# Patient Record
Sex: Male | Born: 1940 | Race: White | Hispanic: No | Marital: Married | Smoking: Former smoker
Health system: Southern US, Community
[De-identification: ages and names within clinical notes are randomized; demographics above are authoritative.]

## PROBLEM LIST (undated history)

## (undated) DIAGNOSIS — C3490 Malignant neoplasm of unspecified part of unspecified bronchus or lung: Secondary | ICD-10-CM

## (undated) DIAGNOSIS — H9190 Unspecified hearing loss, unspecified ear: Secondary | ICD-10-CM

## (undated) DIAGNOSIS — F1910 Other psychoactive substance abuse, uncomplicated: Secondary | ICD-10-CM

## (undated) DIAGNOSIS — I509 Heart failure, unspecified: Secondary | ICD-10-CM

## (undated) DIAGNOSIS — J449 Chronic obstructive pulmonary disease, unspecified: Secondary | ICD-10-CM

## (undated) DIAGNOSIS — R918 Other nonspecific abnormal finding of lung field: Secondary | ICD-10-CM

## (undated) DIAGNOSIS — I5020 Unspecified systolic (congestive) heart failure: Secondary | ICD-10-CM

## (undated) DIAGNOSIS — A481 Legionnaires' disease: Secondary | ICD-10-CM

## (undated) DIAGNOSIS — C7931 Secondary malignant neoplasm of brain: Secondary | ICD-10-CM

## (undated) HISTORY — DX: Other nonspecific abnormal finding of lung field: R91.8

## (undated) HISTORY — PX: NOSE SURGERY: SHX723

## (undated) HISTORY — PX: GANGLION CYST EXCISION: SHX1691

## (undated) HISTORY — PX: WRIST SURGERY: SHX841

## (undated) HISTORY — DX: Unspecified systolic (congestive) heart failure: I50.20

## (undated) HISTORY — DX: Other psychoactive substance abuse, uncomplicated: F19.10

---

## 2001-03-01 ENCOUNTER — Encounter: Payer: Self-pay | Admitting: Family Medicine

## 2001-03-01 ENCOUNTER — Ambulatory Visit (HOSPITAL_COMMUNITY): Admission: RE | Admit: 2001-03-01 | Discharge: 2001-03-01 | Payer: Self-pay | Admitting: Family Medicine

## 2001-12-20 ENCOUNTER — Ambulatory Visit (HOSPITAL_COMMUNITY): Admission: RE | Admit: 2001-12-20 | Discharge: 2001-12-20 | Payer: Self-pay | Admitting: Family Medicine

## 2001-12-20 ENCOUNTER — Encounter: Payer: Self-pay | Admitting: Family Medicine

## 2002-07-04 ENCOUNTER — Encounter: Payer: Self-pay | Admitting: Family Medicine

## 2002-07-04 ENCOUNTER — Ambulatory Visit (HOSPITAL_COMMUNITY): Admission: RE | Admit: 2002-07-04 | Discharge: 2002-07-04 | Payer: Self-pay | Admitting: Family Medicine

## 2002-07-06 ENCOUNTER — Encounter: Payer: Self-pay | Admitting: Family Medicine

## 2002-07-06 ENCOUNTER — Ambulatory Visit (HOSPITAL_COMMUNITY): Admission: RE | Admit: 2002-07-06 | Discharge: 2002-07-06 | Payer: Self-pay | Admitting: Family Medicine

## 2003-04-21 ENCOUNTER — Ambulatory Visit (HOSPITAL_COMMUNITY)
Admission: RE | Admit: 2003-04-21 | Discharge: 2003-04-21 | Payer: Self-pay | Admitting: Physical Medicine and Rehabilitation

## 2004-04-17 ENCOUNTER — Ambulatory Visit (HOSPITAL_COMMUNITY): Admission: RE | Admit: 2004-04-17 | Discharge: 2004-04-17 | Payer: Self-pay | Admitting: Internal Medicine

## 2004-04-17 ENCOUNTER — Ambulatory Visit: Payer: Self-pay | Admitting: Internal Medicine

## 2012-07-23 ENCOUNTER — Emergency Department (HOSPITAL_COMMUNITY): Payer: Medicare Other

## 2012-07-23 ENCOUNTER — Encounter (HOSPITAL_COMMUNITY): Payer: Self-pay

## 2012-07-23 ENCOUNTER — Emergency Department (HOSPITAL_COMMUNITY)
Admission: EM | Admit: 2012-07-23 | Discharge: 2012-07-23 | Disposition: A | Discharge status: Home / Self Care | Payer: Medicare Other | Attending: Emergency Medicine | Admitting: Emergency Medicine

## 2012-07-23 DIAGNOSIS — Z8701 Personal history of pneumonia (recurrent): Secondary | ICD-10-CM | POA: Insufficient documentation

## 2012-07-23 DIAGNOSIS — Z8739 Personal history of other diseases of the musculoskeletal system and connective tissue: Secondary | ICD-10-CM | POA: Insufficient documentation

## 2012-07-23 DIAGNOSIS — M542 Cervicalgia: Secondary | ICD-10-CM | POA: Insufficient documentation

## 2012-07-23 DIAGNOSIS — R059 Cough, unspecified: Secondary | ICD-10-CM | POA: Insufficient documentation

## 2012-07-23 DIAGNOSIS — R918 Other nonspecific abnormal finding of lung field: Secondary | ICD-10-CM

## 2012-07-23 DIAGNOSIS — R222 Localized swelling, mass and lump, trunk: Secondary | ICD-10-CM | POA: Insufficient documentation

## 2012-07-23 DIAGNOSIS — F17200 Nicotine dependence, unspecified, uncomplicated: Secondary | ICD-10-CM | POA: Insufficient documentation

## 2012-07-23 DIAGNOSIS — J3489 Other specified disorders of nose and nasal sinuses: Secondary | ICD-10-CM | POA: Insufficient documentation

## 2012-07-23 DIAGNOSIS — R05 Cough: Secondary | ICD-10-CM | POA: Insufficient documentation

## 2012-07-23 LAB — CBC WITH DIFFERENTIAL/PLATELET
Basophils Absolute: 0 10*3/uL (ref 0.0–0.1)
Eosinophils Relative: 2 % (ref 0–5)
Lymphocytes Relative: 23 % (ref 12–46)
Neutro Abs: 4.7 10*3/uL (ref 1.7–7.7)
Platelets: 289 10*3/uL (ref 150–400)
RDW: 13.6 % (ref 11.5–15.5)
WBC: 7.1 10*3/uL (ref 4.0–10.5)

## 2012-07-23 LAB — BASIC METABOLIC PANEL
CO2: 25 mEq/L (ref 19–32)
Calcium: 9.7 mg/dL (ref 8.4–10.5)
Chloride: 99 mEq/L (ref 96–112)
Sodium: 135 mEq/L (ref 135–145)

## 2012-07-23 LAB — TROPONIN I: Troponin I: <0.3 ng/mL (ref <0.30)

## 2012-07-23 LAB — MAGNESIUM: Magnesium: 2 mg/dL (ref 1.5–2.5)

## 2012-07-23 MED ORDER — SODIUM CHLORIDE 0.9 % IV SOLN
Freq: Once | INTRAVENOUS | Status: AC
  Administered 2012-07-23: 15:00:00 via INTRAVENOUS

## 2012-07-23 MED ORDER — IOHEXOL 300 MG/ML  SOLN
80.0000 mL | Freq: Once | INTRAMUSCULAR | Status: AC | PRN
  Administered 2012-07-23: 80 mL via INTRAVENOUS

## 2012-07-23 NOTE — ED Provider Notes (Addendum)
History  This chart was scribed for Hurman Horn, MD by Erskine Emery, ED Scribe. This patient was seen in room APA09/APA09 and the patient's care was started at 16:00.   CSN: [id number]  Arrival date & time 07/23/12  1423   First MD Initiated Contact with Patient 07/23/12 1600      Chief Complaint  Patient presents with  . Chest Pain    (Consider location/radiation/quality/duration/timing/severity/associated sxs/prior treatment) The history is provided by the patient. No language interpreter was used.  Luis Payne is a 72 y.o. male who presents to the Emergency Department complaining of constant 24/7 mild aching pain in his elbow that radiates up to the left shoulder and left chest for the past couple weeks. Pt denies any associated SOB, nausea, emesis, diaphoresis, fever, weakness or numbness in the arm, color change in the left arm, swelling, weight loss, coughing up blood, bloody stools, or abdominal pain. He reports some chronic congestion and cough but no recent worsening. He also has some chronic neck pain, possibly from arthritis. Pt reports aspirin relieves the pain but nothing seems to aggravate the pain. The pain has not at all limited his function.  Pt reports he saw his PCP this morning for the same complaint. His PCP ran an EKG that was normal but he asked him to come to the hospital anyways. Pt reports he is otherwise perfectly healthy. 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 Pt reports he lives with his wife. Pt is a smoker.    Dr. Sherwood Gambler is the pt's PCP.  History reviewed. No pertinent past medical history.  Past Surgical History  Procedure Laterality Date  . Wrist surgery      No family history on file.  History  Substance Use Topics  . Smoking status: Current Every Day Smoker  . Smokeless tobacco: Not on file  . Alcohol Use: No      Review of Systems 10 Systems reviewed and are  negative for acute change except as noted in the HPI.   Allergies  Review of patient's allergies indicates no known allergies.  Home Medications   Current Outpatient Rx  Name  Route  Sig  Dispense  Refill  . Ascorbic Acid (VITAMIN C) 500 MG CAPS   Oral   Take 1 capsule by mouth every morning.         Marland Kitchen aspirin buffered (BUFFERIN) 325 MG TABS tablet   Oral   Take 975 mg by mouth once as needed.         . cholecalciferol (VITAMIN D) 1000 UNITS tablet   Oral   Take 1,000 Units by mouth every morning.         . Multiple Vitamin (MULTIVITAMIN WITH MINERALS) TABS   Oral   Take 1 tablet by mouth every morning.           Triage Vitals: BP 156/64  Pulse 44  Temp(Src) 97.3 F (36.3 C) (Oral)  Resp 18  SpO2 100%  Physical Exam  Nursing note and vitals reviewed. Constitutional:  Awake, alert, nontoxic appearance.  HENT:  Head: Atraumatic.  Mouth/Throat: Oropharynx is clear and moist. No oropharyngeal exudate.  Eyes: Right eye exhibits no discharge. Left eye exhibits no discharge.  Neck: Neck supple.  Cardiovascular: Normal rate, regular rhythm and normal heart sounds.   No murmur heard. Pulmonary/Chest: Effort normal and breath sounds normal. No respiratory distress. He exhibits no  tenderness.  Lungs are clear.  Abdominal: Soft. There is no tenderness. There is no rebound.  Musculoskeletal: He exhibits no tenderness.  Baseline ROM, no obvious new focal weakness. Left arm is nontender with no edema. CR is less than 2 seconds. Left radial pulse is intact. Intact light touch and motor strength distributions of the median, radial, and ulnar nerve functions.  Neurological:  Mental status and motor strength appears baseline for patient and situation.  Skin: No rash noted.  Psychiatric: He has a normal mood and affect.    ED Course  Procedures (including critical care time) DIAGNOSTIC STUDIES: Oxygen Saturation is 100% on room air, normal by my interpretation.     COORDINATION OF CARE: 16:10--Patient / Family / Caregiver informed of clinical course, understand medical decision-making process, and agree with plan. We discussed the pt's chest x-ray results. Treatment plan includes: IV fluids, CBC, troponin, chest x-ray, EKG, chest CT with contrast.  19:06--Pt stable in ED with no significant deterioration in condition. I explained to the pt the results of his chest CT, which he took well. I gave him a referral for [facility]--[facility]idisciplinary thoracic oncology clinic for follow up.  Labs Reviewed  BASIC METABOLIC PANEL - Abnormal; Notable for the following:    GFR calc non Af Amer 73 (*)    GFR calc Af Amer 84 (*)    All other components within normal limits  CBC WITH DIFFERENTIAL  TROPONIN I  MAGNESIUM   Dg Chest 2 View  07/23/2012  *RADIOLOGY REPORT*  Clinical Data: Chest pain  CHEST - 2 VIEW  Comparison: None.  Findings: 6.4 x 5.2 cm mass-like opacity in the left upper lobe.  Underlying chronic is markings/that this changes. No pleural effusion or pneumothorax.  The heart is normal in size.  Mild degenerative changes of the visualized thoracolumbar spine.  IMPRESSION: 6.4 cm mass-like opacity in the left upper lobe, worrisome for primary bronchogenic neoplasm.  CT chest with contrast is suggested for further evaluation.   Original Report Authenticated By: Charline Bills, M.D.    Ct Chest W Contrast  07/23/2012  *RADIOLOGY REPORT*  Clinical Data: Chest pain. COPD.  Left lung mass seen on chest radiograph.  CT CHEST WITH CONTRAST  Technique:  Multidetector CT imaging of the chest was performed following the standard protocol during bolus administration of intravenous contrast.  Contrast: 80mL OMNIPAQUE IOHEXOL 300 MG/ML  SOLN  Comparison: Chest radiograph earlier today  Findings: Severe pulmonary emphysema is demonstrated.  A large spiculated mass is seen in the left lung apex which abuts the anterior chest wall.  This measures 4.8 x 5.6 cm, and is  consistent with bronchogenic carcinoma.  No definite evidence of direct chest wall invasion or pleural effusion.  No other suspicious pulmonary nodules masses are identified.  Right lower lobe scarring noted.  There is no evidence of hilar lymphadenopathy. A 6 mm lymph node is seen in the region of the AP window, which is not pathologically enlarged. No pathologically enlarged mediastinal nodes are seen. No other lymphadenopathy seen within the thorax.  No evidence of chest wall mass or suspicious bone lesions.  Both adrenal glands are normal in appearance.  IMPRESSION:  1.  5.6 cm spiculated mass in the anterior left lung apex, consistent with bronchogenic carcinoma.  Consultation with the [facility] [phone]) should be considered. 2.  No definite evidence of metastatic disease. 3.  Severe pulmonary emphysema.   Original Report Authenticated By: Myles Rosenthal, M.D.  1. Lung mass   2. Chest Pain    MDM   I doubt any other EMC precluding discharge at this time including, but not necessarily limited to the following:ACS.  I personally performed the services described in this documentation, which was scribed in my presence. The recorded information has been reviewed and is accurate.    Hurman Horn, MD 07/24/12 1257  Hurman Horn, MD 09/09/12 [phone]

## 2012-07-23 NOTE — ED Notes (Signed)
Pt reports has had intermittent aching in elbow that radiates up to left shoulder and left chest.  Says feels like pressure on his chest and arm aches.  Reports left hand feels numb and tingly.  Denies any SOB, n/v, or sweating.

## 2012-08-02 ENCOUNTER — Encounter (HOSPITAL_COMMUNITY)
Admission: RE | Admit: 2012-08-02 | Discharge: 2012-08-02 | Disposition: A | Discharge status: Home / Self Care | Payer: Medicare Other | Source: Ambulatory Visit | Attending: Emergency Medicine | Admitting: Emergency Medicine

## 2012-08-02 DIAGNOSIS — J439 Emphysema, unspecified: Secondary | ICD-10-CM | POA: Insufficient documentation

## 2012-08-02 DIAGNOSIS — R222 Localized swelling, mass and lump, trunk: Secondary | ICD-10-CM | POA: Insufficient documentation

## 2012-08-02 DIAGNOSIS — N200 Calculus of kidney: Secondary | ICD-10-CM | POA: Insufficient documentation

## 2012-08-02 DIAGNOSIS — R0789 Other chest pain: Secondary | ICD-10-CM | POA: Insufficient documentation

## 2012-08-02 DIAGNOSIS — I2510 Atherosclerotic heart disease of native coronary artery without angina pectoris: Secondary | ICD-10-CM | POA: Insufficient documentation

## 2012-08-02 MED ORDER — FLUDEOXYGLUCOSE F - 18 (FDG) INJECTION
18.0000 | Freq: Once | INTRAVENOUS | Status: AC | PRN
  Administered 2012-08-02: 18 via INTRAVENOUS

## 2012-08-05 ENCOUNTER — Institutional Professional Consult (permissible substitution) (INDEPENDENT_AMBULATORY_CARE_PROVIDER_SITE_OTHER): Payer: Medicare Other | Admitting: Surgery

## 2012-08-05 ENCOUNTER — Encounter: Payer: Self-pay | Admitting: Surgery

## 2012-08-05 ENCOUNTER — Encounter: Payer: Self-pay | Admitting: *Deleted

## 2012-08-05 ENCOUNTER — Other Ambulatory Visit: Payer: Self-pay | Admitting: *Deleted

## 2012-08-05 VITALS — BP 147/58 | HR 67 | Resp 16 | Ht 66.0 in | Wt 126.0 lb

## 2012-08-05 DIAGNOSIS — R918 Other nonspecific abnormal finding of lung field: Secondary | ICD-10-CM

## 2012-08-05 DIAGNOSIS — R222 Localized swelling, mass and lump, trunk: Secondary | ICD-10-CM

## 2012-08-05 DIAGNOSIS — J439 Emphysema, unspecified: Secondary | ICD-10-CM

## 2012-08-05 DIAGNOSIS — J438 Other emphysema: Secondary | ICD-10-CM

## 2012-08-05 DIAGNOSIS — F1910 Other psychoactive substance abuse, uncomplicated: Secondary | ICD-10-CM | POA: Insufficient documentation

## 2012-08-05 NOTE — Progress Notes (Signed)
[address]       [place] [address]             [phone]       PCP is Cassell Smiles., MD Referring Provider is Hurman Horn, MD  Chief Complaint  Patient presents with  . Lung Mass    CT CHEST @ [facility] while in the ER    HPI:  The patient is a 72 year old white male smoker who presented to the [facility] emergency room a couple weeks ago with a several week history of aching pain in his left arm from the elbow to the shoulder. A chest x-ray in the emergency room showed a left upper lobe lung mass. CT scan the chest showed a 4.8 x 5.6 cm mass in the left lung apex abutting the anterior chest wall suggesting bronchogenic carcinoma. There was no definite evidence of direct chest wall invasion. There was no pleural effusion. There was no evidence of hilar lymphadenopathy. There was a 6 mm lymph node in the AP window and there were no other pathologically enlarged mediastinal lymph nodes. There were changes of severe emphysema. He then underwent a PET scan which showed hypermetabolic activity within the left upper lobe lung mass with a maximum SUV of 12.8 with some suggestion of involvement of the left anterior first rib. There were no other areas of hypermetabolic activity within the mediastinal or hilar nodes or distant hypermetabolic activity.  Past Medical History  Diagnosis Date  . Substance abuse     TOBACCO  . Lung mass     Past Surgical History  Procedure Laterality Date  . Wrist surgery      Family History  Problem Relation Age of Onset  . COPD Father   . Cancer Sister   . Diabetes Brother     Social History History  Substance Use Topics  . Smoking status: Current Every Day Smoker -- 1.00 packs/day    Types: Cigarettes  . Smokeless tobacco: Never Used     Comment: TRING TO QUIT...DOWN TO 1 PACK PER WEEK  . Alcohol Use: No    Current Outpatient Prescriptions  Medication Sig Dispense Refill  . Ascorbic Acid (VITAMIN C) 500 MG CAPS  Take 1 capsule by mouth every morning.      Marland Kitchen aspirin buffered (BUFFERIN) 325 MG TABS tablet Take 975 mg by mouth once as needed.      . cholecalciferol (VITAMIN D) 1000 UNITS tablet Take 1,000 Units by mouth every morning.      . Multiple Vitamin (MULTIVITAMIN WITH MINERALS) TABS Take 1 tablet by mouth every morning.       No current facility-administered medications for this visit.    No Known Allergies  Review of Systems  Constitutional: Positive for unexpected weight change. Negative for activity change, appetite change and fatigue.       Has lost 10 lbs over the last year.  HENT: Positive for hearing loss.   Eyes: Negative.   Respiratory: Positive for shortness of breath.        With exertion.  Non-productive cough.  Cardiovascular: Negative for chest pain and palpitations.  Endocrine: Negative.   Genitourinary: Negative.   Musculoskeletal: Positive for arthralgias.       Pain in left arm from elbow to shoulder.  Skin: Negative.   Neurological: Negative.   Hematological: Negative.   Psychiatric/Behavioral: Negative.     BP 147/58  Pulse 67  Resp 16  Ht 5\' 6"  (1.676 m)  Wt 126 lb (57.153 kg)  BMI 20.35 kg/m2  SpO2 98% Physical Exam  Constitutional: He is oriented to person, place, and time. He appears well-developed and well-nourished.  Thin gentleman in no distress.  HENT:  Head: Normocephalic and atraumatic.  Mouth/Throat: Oropharynx is clear and moist.  Eyes: Conjunctivae and EOM are normal. Pupils are equal, round, and reactive to light.  Neck: No JVD present. No thyromegaly present.  Cardiovascular: Normal rate, regular rhythm, normal heart sounds and intact distal pulses.  Exam reveals no gallop and no friction rub.   No murmur heard. Pulmonary/Chest: Effort normal and breath sounds normal. No respiratory distress. He has no wheezes. He has no rales. He exhibits no tenderness.  No chest wall deformity.  Abdominal: Soft. Bowel sounds are normal. He exhibits  no distension and no mass. There is no tenderness.  Musculoskeletal: Normal range of motion. He exhibits no edema and no tenderness.  Lymphadenopathy:    He has no cervical adenopathy.  Neurological: He is alert and oriented to person, place, and time. He has normal strength. No cranial nerve deficit or sensory deficit.  Skin: Skin is warm and dry.  Psychiatric: He has a normal mood and affect.     Diagnostic Tests:  *RADIOLOGY REPORT*   Clinical Data: Initial treatment strategy for lung mass.   NUCLEAR MEDICINE PET SKULL BASE TO THIGH  08/02/2012   Fasting Blood Glucose:  92   Technique:  18.0 mCi F-18 FDG was injected intravenously. CT data was obtained and used for attenuation correction and anatomic localization only.  (This was not acquired as a diagnostic CT examination.) Additional exam technical data entered on technologist worksheet.   Comparison:  07/23/2012   Findings:   Neck: No hypermetabolic lymph nodes in the neck.   Chest:  5.9 x 6.1 cm left upper lobe mass (series 2/image 53), max SUV 12.8, compatible with primary bronchogenic neoplasm.   Mass abuts the anterior chest wall with suspected involvement of the left anterior first rib (series 2/image 53).   Underlying extensive centrilobular and paraseptal emphysematous changes with dominant bullae in the right lower lobe.   No hypermetabolic mediastinal or hilar nodes.   Coronary atherosclerosis.   Abdomen/Pelvis:  No abnormal hypermetabolic activity within the liver, pancreas, adrenal glands, or spleen.   No hypermetabolic lymph nodes in the abdomen or pelvis.   Multiple bilateral nonobstructing renal calculi measuring up to 8 mm in the right upper pole and 6 mm in the left lower pole.  No hydronephrosis.   Skeleton:  Suspected left first rib involvement, as described above.   Otherwise, no focal hypermetabolism to suggest osseous metastases.   IMPRESSION: 6.1 cm left upper lobe mass, max SUV  12.8, compatible with primary bronchogenic neoplasm.   Mass abuts the anterior chest wall with suspected involvement of the left anterior 1st rib.   Otherwise, no evidence of metastatic disease.     Original Report Authenticated By: Charline Bills, M.D.   *RADIOLOGY REPORT*   Clinical Data: Chest pain. COPD.  Left lung mass seen on chest radiograph.   CT CHEST WITH CONTRAST 07/23/2012   Technique:  Multidetector CT imaging of the chest was performed following the standard protocol during bolus administration of intravenous contrast.   Contrast: 80mL OMNIPAQUE IOHEXOL 300 MG/ML  SOLN   Comparison: Chest radiograph earlier today   Findings: Severe pulmonary emphysema is demonstrated.  A large spiculated mass is seen in the left lung apex which abuts the anterior chest wall.  This  measures 4.8 x 5.6 cm, and is consistent with bronchogenic carcinoma.  No definite evidence of direct chest wall invasion or pleural effusion.  No other suspicious pulmonary nodules masses are identified.  Right lower lobe scarring noted.   There is no evidence of hilar lymphadenopathy. A 6 mm lymph node is seen in the region of the AP window, which is not pathologically enlarged. No pathologically enlarged mediastinal nodes are seen. No other lymphadenopathy seen within the thorax.  No evidence of chest wall mass or suspicious bone lesions.  Both adrenal glands are normal in appearance.   IMPRESSION:   1.  5.6 cm spiculated mass in the anterior left lung apex, consistent with bronchogenic carcinoma.  Consultation with the [facility] [phone]) should be considered. 2.  No definite evidence of metastatic disease. 3.  Severe pulmonary emphysema.     Original Report Authenticated By: Myles Rosenthal, M.D.    Impression:  This gentleman has a large left apical lung mass abutting the anterior chest wall that is most likely bronchogenic carcinoma. His left  arm symptoms are concerning for direct chest wall involvement but the uppermost extent of this tumor still looks below the subclavian/axillary vessels and brachial plexus. I think that if his pulmonary function testing is acceptable and his brain MRI is negative then surgical resection by her left upper lobectomy and possible chest wall resection if indicated would be the best treatment for him.  Plan: 1. We'll schedule pulmonary function testing with diffusion capacity. 2. We'll schedule MRI of the brain. 3. I will see him back after these tests have been done to discuss results with him and decide on a treatment plan.

## 2012-08-05 NOTE — Progress Notes (Signed)
Spoke to pt and wife at [facility] today.  Gave and explained information on smoking cessation and lung surgery.

## 2012-08-06 ENCOUNTER — Encounter: Payer: Self-pay | Admitting: *Deleted

## 2012-08-11 ENCOUNTER — Encounter: Payer: Self-pay | Admitting: Surgery

## 2012-08-11 ENCOUNTER — Ambulatory Visit (HOSPITAL_COMMUNITY)
Admission: RE | Admit: 2012-08-11 | Discharge: 2012-08-11 | Disposition: A | Discharge status: Home / Self Care | Payer: Medicare Other | Source: Ambulatory Visit | Attending: Surgery | Admitting: Surgery

## 2012-08-11 ENCOUNTER — Ambulatory Visit (INDEPENDENT_AMBULATORY_CARE_PROVIDER_SITE_OTHER): Payer: Medicare Other | Admitting: Surgery

## 2012-08-11 VITALS — BP 123/77 | HR 84 | Resp 20 | Ht 64.0 in | Wt 120.0 lb

## 2012-08-11 DIAGNOSIS — R05 Cough: Secondary | ICD-10-CM | POA: Insufficient documentation

## 2012-08-11 DIAGNOSIS — J438 Other emphysema: Secondary | ICD-10-CM

## 2012-08-11 DIAGNOSIS — R918 Other nonspecific abnormal finding of lung field: Secondary | ICD-10-CM

## 2012-08-11 DIAGNOSIS — C3490 Malignant neoplasm of unspecified part of unspecified bronchus or lung: Secondary | ICD-10-CM | POA: Insufficient documentation

## 2012-08-11 DIAGNOSIS — J439 Emphysema, unspecified: Secondary | ICD-10-CM

## 2012-08-11 DIAGNOSIS — R059 Cough, unspecified: Secondary | ICD-10-CM | POA: Insufficient documentation

## 2012-08-11 DIAGNOSIS — R222 Localized swelling, mass and lump, trunk: Secondary | ICD-10-CM

## 2012-08-11 DIAGNOSIS — R42 Dizziness and giddiness: Secondary | ICD-10-CM | POA: Insufficient documentation

## 2012-08-11 LAB — PULMONARY FUNCTION TEST

## 2012-08-11 MED ORDER — ALBUTEROL SULFATE (5 MG/ML) 0.5% IN NEBU
2.5000 mg | INHALATION_SOLUTION | Freq: Once | RESPIRATORY_TRACT | Status: AC
  Administered 2012-08-11: 2.5 mg via RESPIRATORY_TRACT

## 2012-08-11 MED ORDER — GADOBENATE DIMEGLUMINE 529 MG/ML IV SOLN
10.0000 mL | Freq: Once | INTRAVENOUS | Status: AC
  Administered 2012-08-11: 10 mL via INTRAVENOUS

## 2012-08-11 NOTE — Progress Notes (Signed)
[address]       [place] [address]             [phone]       HPI:  The patient is a 72 year old white male smoker who presented to the [facility] emergency room a couple weeks ago with a several week history of aching pain in his left arm from the elbow to the shoulder. A chest x-ray in the emergency room showed a left upper lobe lung mass. CT scan the chest showed a 4.8 x 5.6 cm mass in the left lung apex abutting the anterior chest wall suggesting bronchogenic carcinoma. There was no definite evidence of direct chest wall invasion. There was no pleural effusion. There was no evidence of hilar lymphadenopathy. There was a 6 mm lymph node in the AP window and there were no other pathologically enlarged mediastinal lymph nodes. There were changes of severe emphysema. He then underwent a PET scan which showed hypermetabolic activity within the left upper lobe lung mass with a maximum SUV of 12.8 with some suggestion of involvement of the left anterior first rib. There were no other areas of hypermetabolic activity within the mediastinal or hilar nodes or distant hypermetabolic activity. 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 The patient denies any headaches or visual changes. He denies focal weakness. He denies dizziness. He denies seizure activity.      Current Outpatient Prescriptions  Medication Sig Dispense Refill  . Ascorbic Acid (VITAMIN C) 500 MG CAPS Take 1 capsule by mouth every morning.      . cholecalciferol (VITAMIN D) 1000 UNITS tablet Take 1,000 Units by mouth every morning.      Marland Kitchen ibuprofen (ADVIL,MOTRIN) 200 MG tablet Take 200 mg by mouth every 8 (eight) hours as needed for pain.      Marland Kitchen loratadine (CLARITIN) 10 MG tablet Take 10 mg by mouth daily.      . Multiple Vitamin (MULTIVITAMIN WITH MINERALS) TABS  Take 1 tablet by mouth every morning.       No current facility-administered medications for this visit.     Physical Exam: BP 123/77  Pulse 84  Resp 20  Ht 5\' 4"  (1.626 m)  Wt 120 lb (54.432 kg)  BMI 20.59 kg/m2  SpO2 95% He is a thin, chronically ill-appearing gentleman. Lung exam is clear. Neurologic exam shows him to be alert and oriented x3. Motor strength is 5 out of 5 in all extremities.  Diagnostic Tests:  *RADIOLOGY REPORT*   Clinical Data: Lung cancer.  Dizziness.   MRI HEAD WITHOUT AND WITH CONTRAST   Technique:  Multiplanar, multiecho pulse sequences of the brain and surrounding structures were obtained according to standard protocol without and with intravenous contrast   Contrast: 10mL MULTIHANCE GADOBENATE DIMEGLUMINE 529 MG/ML IV SOLN   Comparison: None.   Findings: There is a 5 mm metastasis in the right parietal lobe towards the vertex with surrounding vasogenic edema but no shift or hemorrhage.  There is a 4 mm metastasis at the left parietal region at the vertex with surrounding edema but no shift or hemorrhage. No other lesion is seen.  There are mild chronic small vessel changes affecting the hemispheric white matter.  No hydrocephalus or extra-axial collection.  No pituitary mass.  No inflammatory sinus disease.  No skull or skull  base lesion.   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     Original Report Authenticated By: Paulina Fusi, M.D.     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 This will make him a nonsurgical candidate. He'll need a CT guided needle biopsy of the left lung mass and followup in [facility] clinic for radiation oncology and medical oncology evaluation. At this time I don't think he is  having any neurologic symptoms related to the brain metastasis which are small. I think his left arm pain is probably related to the chest wall involvement. I reviewed the MRI scan with the patient and his wife as well as the further workup from here. They seem to understand and all their questions were answered.  Plan:  He'll be scheduled for CT-guided needle biopsy of the left upper lobe lung mass by interventional radiology. He will have a followup appointment in [facility] clinic next week.

## 2012-08-12 ENCOUNTER — Other Ambulatory Visit: Payer: Self-pay

## 2012-08-12 DIAGNOSIS — D381 Neoplasm of uncertain behavior of trachea, bronchus and lung: Secondary | ICD-10-CM

## 2012-08-13 ENCOUNTER — Other Ambulatory Visit: Payer: Self-pay | Admitting: Radiology

## 2012-08-16 ENCOUNTER — Other Ambulatory Visit: Payer: Self-pay | Admitting: Radiation Therapy

## 2012-08-16 ENCOUNTER — Telehealth: Payer: Self-pay | Admitting: *Deleted

## 2012-08-16 DIAGNOSIS — C7931 Secondary malignant neoplasm of brain: Secondary | ICD-10-CM

## 2012-08-16 DIAGNOSIS — C7949 Secondary malignant neoplasm of other parts of nervous system: Secondary | ICD-10-CM

## 2012-08-16 NOTE — Telephone Encounter (Signed)
Spoke with pt regarding appt time and place.  He verbalized understanding of time and place.  [facility] 08/19/12 at 2:00 arrive at 1:45

## 2012-08-17 ENCOUNTER — Encounter (HOSPITAL_COMMUNITY): Payer: Self-pay

## 2012-08-17 ENCOUNTER — Other Ambulatory Visit: Payer: Self-pay | Admitting: *Deleted

## 2012-08-17 ENCOUNTER — Ambulatory Visit (HOSPITAL_COMMUNITY)
Admission: RE | Admit: 2012-08-17 | Discharge: 2012-08-17 | Disposition: A | Discharge status: Home / Self Care | Payer: Medicare Other | Source: Ambulatory Visit | Attending: Surgery | Admitting: Surgery

## 2012-08-17 DIAGNOSIS — D381 Neoplasm of uncertain behavior of trachea, bronchus and lung: Secondary | ICD-10-CM

## 2012-08-17 DIAGNOSIS — F17200 Nicotine dependence, unspecified, uncomplicated: Secondary | ICD-10-CM | POA: Insufficient documentation

## 2012-08-17 DIAGNOSIS — Z01812 Encounter for preprocedural laboratory examination: Secondary | ICD-10-CM | POA: Insufficient documentation

## 2012-08-17 DIAGNOSIS — C3410 Malignant neoplasm of upper lobe, unspecified bronchus or lung: Secondary | ICD-10-CM | POA: Insufficient documentation

## 2012-08-17 DIAGNOSIS — C7931 Secondary malignant neoplasm of brain: Secondary | ICD-10-CM

## 2012-08-17 DIAGNOSIS — C3490 Malignant neoplasm of unspecified part of unspecified bronchus or lung: Secondary | ICD-10-CM

## 2012-08-17 HISTORY — PX: LUNG BIOPSY: SHX232

## 2012-08-17 HISTORY — DX: Malignant neoplasm of unspecified part of unspecified bronchus or lung: C34.90

## 2012-08-17 LAB — APTT: aPTT: 31 seconds (ref 24–37)

## 2012-08-17 LAB — CBC
Platelets: 240 10*3/uL (ref 150–400)
RBC: 4.43 MIL/uL (ref 4.22–5.81)
RDW: 13.6 % (ref 11.5–15.5)
WBC: 7.3 10*3/uL (ref 4.0–10.5)

## 2012-08-17 LAB — PROTIME-INR: INR: 0.96 (ref 0.00–1.49)

## 2012-08-17 MED ORDER — HYDROCODONE-ACETAMINOPHEN 5-325 MG PO TABS: 1.0000 | ORAL_TABLET | ORAL | Status: DC | PRN

## 2012-08-17 MED ORDER — SODIUM CHLORIDE 0.9 % IV SOLN
Freq: Once | INTRAVENOUS | Status: AC
  Administered 2012-08-17: 08:00:00 via INTRAVENOUS

## 2012-08-17 MED ORDER — DEXAMETHASONE 4 MG PO TABS: 4.0000 mg | ORAL_TABLET | Freq: Two times a day (BID) | ORAL | Status: DC

## 2012-08-17 MED ORDER — FENTANYL CITRATE 0.05 MG/ML IJ SOLN
INTRAMUSCULAR | Status: AC
  Filled 2012-08-17: qty 2

## 2012-08-17 MED ORDER — MIDAZOLAM HCL 2 MG/2ML IJ SOLN
INTRAMUSCULAR | Status: AC | PRN
  Administered 2012-08-17: 1 mg via INTRAVENOUS

## 2012-08-17 MED ORDER — FENTANYL CITRATE 0.05 MG/ML IJ SOLN
INTRAMUSCULAR | Status: AC | PRN
  Administered 2012-08-17: 25 ug via INTRAVENOUS

## 2012-08-17 MED ORDER — MIDAZOLAM HCL 2 MG/2ML IJ SOLN
INTRAMUSCULAR | Status: AC
  Filled 2012-08-17: qty 4

## 2012-08-17 NOTE — ED Notes (Signed)
Short stay notified for bed 

## 2012-08-17 NOTE — H&P (Signed)
Luis Payne is an 72 y.o. male.   Chief Complaint: left arm pain 2 weeks ago Primary MD had xrays performed and pt was sent to Dr Laneta Simmers for evaluation CT reveals left lung mass; +PET Scheduled now for left lung mass biopsy HPI: smoker  Past Medical History  Diagnosis Date  . Substance abuse     TOBACCO  . Lung mass     Past Surgical History  Procedure Laterality Date  . Wrist surgery      Family History  Problem Relation Age of Onset  . COPD Father   . Cancer Sister   . Diabetes Brother    Social History:  reports that he has been smoking Cigarettes.  He has been smoking about 1.00 pack per day. He has never used smokeless tobacco. He reports that he does not drink alcohol or use illicit drugs.  Allergies: No Known Allergies   (Not in a hospital admission)  Results for orders placed during the hospital encounter of 08/17/12 (from the past 48 hour(s))  APTT     Status: None   Collection Time    08/17/12  7:40 AM      Result Value Range   aPTT 31  24 - 37 seconds  CBC     Status: None   Collection Time    08/17/12  7:40 AM      Result Value Range   WBC 7.3  4.0 - 10.5 K/uL   RBC 4.43  4.22 - 5.81 MIL/uL   Hemoglobin 13.7  13.0 - 17.0 g/dL   HCT 16.1  09.6 - 04.5 %   MCV 88.3  78.0 - 100.0 fL   MCH 30.9  26.0 - 34.0 pg   MCHC 35.0  30.0 - 36.0 g/dL   RDW 40.9  81.1 - 91.4 %   Platelets 240  150 - 400 K/uL  PROTIME-INR     Status: None   Collection Time    08/17/12  7:40 AM      Result Value Range   Prothrombin Time 12.7  11.6 - 15.2 seconds   INR 0.96  0.00 - 1.49   No results found.  Review of Systems  Constitutional: Positive for weight loss. Negative for fever.  Cardiovascular: Negative for chest pain.  Gastrointestinal: Negative for nausea, vomiting and abdominal pain.  Musculoskeletal: Positive for back pain and joint pain.       Left arm pain  Neurological: Negative for weakness and headaches.    Blood pressure 149/68, pulse 84, temperature  97.4 F (36.3 C), temperature source Oral, resp. rate 20, height 5\' 6"  (1.676 m), weight 120 lb (54.432 kg), SpO2 95.00%. Physical Exam  Constitutional: He is oriented to person, place, and time.  Cardiovascular: Normal rate and normal heart sounds.   No murmur heard. Sl irreg  Respiratory: Effort normal and breath sounds normal. He has no wheezes.  GI: Soft. Bowel sounds are normal. There is no tenderness.  Musculoskeletal: Normal range of motion.  Neurological: He is alert and oriented to person, place, and time.  Skin: Skin is warm and dry.  Psychiatric: He has a normal mood and affect. His behavior is normal. Judgment and thought content normal.     Assessment/Plan Left lung mass; +PET Scheduled for mass biopsy Pt aware of procedure benefits and risks and agreeable to proceed Consent signed and in chart  Luis Payne A 08/17/2012, 8:33 AM

## 2012-08-17 NOTE — Procedures (Signed)
Procedure:  CT guided core biopsy of left lung Findings:  CT guided core biopsy of 5.5 cm LUL lung mass.  No PTX.

## 2012-08-17 NOTE — H&P (Signed)
Agree.  For LUL mass biopsy in CT today.

## 2012-08-18 ENCOUNTER — Ambulatory Visit
Admission: RE | Admit: 2012-08-18 | Discharge: 2012-08-18 | Disposition: A | Discharge status: Home / Self Care | Payer: Medicare Other | Source: Ambulatory Visit | Attending: Radiation Oncology | Admitting: Radiation Oncology

## 2012-08-18 DIAGNOSIS — C7931 Secondary malignant neoplasm of brain: Secondary | ICD-10-CM

## 2012-08-18 HISTORY — DX: Secondary malignant neoplasm of brain: C79.31

## 2012-08-18 MED ORDER — GADOBENATE DIMEGLUMINE 529 MG/ML IV SOLN: 11.0000 mL | Freq: Once | INTRAVENOUS | Status: AC | PRN

## 2012-08-18 NOTE — Progress Notes (Signed)
Quick Note:  Susan, please set-up SRS ______ 

## 2012-08-19 ENCOUNTER — Encounter: Payer: Self-pay | Admitting: *Deleted

## 2012-08-19 ENCOUNTER — Encounter: Payer: Self-pay | Admitting: Radiation Oncology

## 2012-08-19 ENCOUNTER — Ambulatory Visit
Admission: RE | Admit: 2012-08-19 | Discharge: 2012-08-19 | Disposition: A | Discharge status: Home / Self Care | Payer: Medicare Other | Source: Ambulatory Visit | Attending: Radiation Oncology | Admitting: Radiation Oncology

## 2012-08-19 DIAGNOSIS — F17200 Nicotine dependence, unspecified, uncomplicated: Secondary | ICD-10-CM | POA: Insufficient documentation

## 2012-08-19 DIAGNOSIS — C3410 Malignant neoplasm of upper lobe, unspecified bronchus or lung: Secondary | ICD-10-CM | POA: Insufficient documentation

## 2012-08-19 DIAGNOSIS — J4489 Other specified chronic obstructive pulmonary disease: Secondary | ICD-10-CM | POA: Insufficient documentation

## 2012-08-19 DIAGNOSIS — C7931 Secondary malignant neoplasm of brain: Secondary | ICD-10-CM | POA: Insufficient documentation

## 2012-08-19 DIAGNOSIS — J449 Chronic obstructive pulmonary disease, unspecified: Secondary | ICD-10-CM | POA: Insufficient documentation

## 2012-08-19 DIAGNOSIS — C7949 Secondary malignant neoplasm of other parts of nervous system: Secondary | ICD-10-CM

## 2012-08-19 DIAGNOSIS — Z79899 Other long term (current) drug therapy: Secondary | ICD-10-CM | POA: Insufficient documentation

## 2012-08-19 NOTE — Progress Notes (Signed)
Spoke with pt and family at [facility] today.  Educational/resource information given and explained.  Distress screening completed.  Smoking cessation eduction given.  Questions and concerns addressed

## 2012-08-20 ENCOUNTER — Ambulatory Visit
Admission: RE | Admit: 2012-08-20 | Discharge: 2012-08-20 | Disposition: A | Discharge status: Home / Self Care | Payer: Medicare Other | Source: Ambulatory Visit | Attending: Radiation Oncology | Admitting: Radiation Oncology

## 2012-08-20 VITALS — BP 152/69 | HR 49 | Temp 97.7°F | Resp 20

## 2012-08-20 DIAGNOSIS — C3410 Malignant neoplasm of upper lobe, unspecified bronchus or lung: Secondary | ICD-10-CM | POA: Insufficient documentation

## 2012-08-20 DIAGNOSIS — R61 Generalized hyperhidrosis: Secondary | ICD-10-CM | POA: Insufficient documentation

## 2012-08-20 DIAGNOSIS — J029 Acute pharyngitis, unspecified: Secondary | ICD-10-CM | POA: Insufficient documentation

## 2012-08-20 DIAGNOSIS — C7931 Secondary malignant neoplasm of brain: Secondary | ICD-10-CM | POA: Insufficient documentation

## 2012-08-20 DIAGNOSIS — Z510 Encounter for antineoplastic radiation therapy: Secondary | ICD-10-CM | POA: Insufficient documentation

## 2012-08-20 DIAGNOSIS — Z79899 Other long term (current) drug therapy: Secondary | ICD-10-CM | POA: Insufficient documentation

## 2012-08-20 DIAGNOSIS — C7949 Secondary malignant neoplasm of other parts of nervous system: Secondary | ICD-10-CM | POA: Insufficient documentation

## 2012-08-20 HISTORY — DX: Malignant neoplasm of unspecified part of unspecified bronchus or lung: C34.90

## 2012-08-20 HISTORY — DX: Secondary malignant neoplasm of brain: C79.31

## 2012-08-20 HISTORY — DX: Unspecified hearing loss, unspecified ear: H91.90

## 2012-08-20 HISTORY — DX: Chronic obstructive pulmonary disease, unspecified: J44.9

## 2012-08-20 MED ORDER — SODIUM CHLORIDE 0.9 % IJ SOLN
10.0000 mL | Freq: Once | INTRAMUSCULAR | Status: AC
Start: 2012-08-20 — End: 2012-08-20
  Administered 2012-08-20: 10 mL via INTRAVENOUS

## 2012-08-20 NOTE — Progress Notes (Addendum)
Patient arrived ambulatory, alert,oriented x3,steady gait, here for IV start for ct sim, for brain mets, pateint denied pain, nuasea, slight sob 99% room air sats, no weakness or dizziness stated, wears hearing aids, bun and cr  Drawn on 07/23/12, bun=20, cr=1.0, not a diabetic, not allergic to Iv dye or shellfish. Iv started RAC x 1 attempt, 22g 1inch catheter needle, excellent blood return, flushed with 10cc normal saline, patient tolerated well, no c/o, is aware he is here early, pateint taking deacron 4mg  bid no thrush seen or noted 9:20 AM

## 2012-08-20 NOTE — Progress Notes (Signed)
  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  Identity was confirmed.  All relevant records and images related to the planned course of therapy were reviewed.  The patient freely provided informed written consent to proceed with treatment after reviewing the details related to the planned course of therapy. The consent form was witnessed and verified by the simulation staff. Intravenous access was established for contrast administration. Then, the patient was set-up in a stable reproducible supine position for radiation therapy.  A relocatable thermoplastic stereotactic head frame was fabricated for precise immobilization.  CT images were obtained.  Surface markings were placed.  The CT images were loaded into the planning software and fused with the patient's targeting MRI scan.  Then the target and avoidance structures were contoured.  Treatment planning then occurred.  The radiation prescription was entered and confirmed.  I have requested 3D planning  I have requested a DVH of the following structures: Brain stem, brain, left eye, right I, lenses, optic chiasm, target volumes, uninvolved brain, and normal tissue.    PLAN:  The patient will receive 20 Gy in one fraction.  ________________________________  [name] Kathrynn Running, M.D.

## 2012-08-22 ENCOUNTER — Encounter: Payer: Self-pay | Admitting: Radiation Oncology

## 2012-08-22 NOTE — Progress Notes (Signed)
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 male who presented to the [facility] emergency room with a several week history of aching pain in his left arm. A chest x-ray in the emergency room showed a left upper lobe lung mass. CT scan the chest 07/23/2012 confirmed the presence of a 4.8 x 5.6 cm mass in the left lung apex abutting the anterior chest wall suggesting bronchogenic carcinoma. There was no definite evidence of direct chest wall invasion.  There were changes of severe emphysema. He then underwent a PET scan 08/02/2012 which showed hypermetabolic activity within the left upper lobe lung mass with a maximum SUV of 12.8 with some suggestion of involvement of the left anterior first rib. There were no other areas of hypermetabolic activity within the mediastinal or hilar nodes or distant hypermetabolic activity.  Subsequent brain MRI on 08/11/2012 revealed a 5 mm right parietal brain metastasis and a 4 mm left parietal brain metastasis, both with surrounding vasogenic edema. CT-guided lung biopsy on 08/17/2012 revealed adenocarcinoma of pulmonary origin. The patient has been presented in our multidisciplinary thoracic oncology conference earlier today. We reviewed his radiographic studies and pathology at that time. He has kindly been referred today for consideration of potential radiation treatment options.  PREVIOUS RADIATION THERAPY: No  PAST MEDICAL HISTORY:  has a past medical history of Substance abuse; Lung mass; Lung cancer (08/17/12); Brain metastases  (08/18/12); HOH (hard of hearing); and COPD (chronic obstructive pulmonary disease).    PAST SURGICAL HISTORY: Past Surgical History  Procedure Laterality Date  . Wrist surgery    . Lung biopsy Left 08/17/12    LUL lung mass-adenocarcinoma    FAMILY HISTORY: family history includes COPD in his father; Cancer in his sister; and Diabetes in his brother.  SOCIAL HISTORY:  reports that he has been smoking Cigarettes.  He has been smoking about 1.00 pack per day. He has never used smokeless tobacco. He reports that he does not drink alcohol or use illicit drugs.  ALLERGIES: Review of patient's allergies indicates no known allergies.  MEDICATIONS:  Current Outpatient Prescriptions  Medication Sig Dispense Refill  . Ascorbic Acid (VITAMIN C) 500 MG CAPS Take 1 capsule by mouth every morning.      . cholecalciferol (VITAMIN D) 1000 UNITS tablet Take 1,000 Units by mouth every morning.      Marland Kitchen dexamethasone (DECADRON) 4 MG tablet Take 1 tablet (4 mg total) by mouth 2 (two) times daily with a meal.  14 tablet  0  . diphenhydrAMINE (BENADRYL) 25 MG tablet Take 50 mg by mouth at bedtime.      Marland Kitchen ibuprofen (ADVIL,MOTRIN) 200 MG tablet Take 200-400 mg by mouth every 8 (eight) hours as needed for pain.       Marland Kitchen loratadine (CLARITIN) 10 MG tablet Take 10 mg by mouth daily.      . Multiple Vitamin (MULTIVITAMIN WITH MINERALS) TABS Take 1 tablet by mouth every morning.       No current facility-administered medications for this encounter.    REVIEW OF SYSTEMS:  A 15 point  review of systems is documented in the electronic medical record. This was obtained by the nursing staff. However, I reviewed this with the patient to discuss relevant findings and make appropriate changes.  Pertinent items are noted in HPI.   PHYSICAL EXAM:  height is 5\' 6"  (1.676 m) and weight is 126 lb (57.153 kg). His oral temperature is 97.2 F (36.2 C). His blood pressure is 136/68 and his pulse is 68. His respiration is 16 and oxygen  saturation is 96%.   The patient was in no acute distress today. He was alert and oriented. He was in good spirits under the circumstances. Respiratory effort is unremarkable. Lungs were essentially clear with distant breath sounds throughout. Heart sounds were also distant but regular. The supraclavicular region was free of adenopathy. The patient referred pain to his shoulder through elbow on the left. No other sites of pain or tenderness were noted. The patient was neurologically grossly intact.  LABORATORY DATA:  Lab Results  Component Value Date   WBC 7.3 08/17/2012   HGB 13.7 08/17/2012   HCT 39.1 08/17/2012   MCV 88.3 08/17/2012   PLT 240 08/17/2012   Lab Results  Component Value Date   NA 135 07/23/2012   K 4.0 07/23/2012   CL 99 07/23/2012   CO2 25 07/23/2012   No results found for this basename: ALT, AST, GGT, ALKPHOS, BILITOT     RADIOGRAPHY: Ct Chest W Contrast  07/23/2012  *RADIOLOGY REPORT*  Clinical Data: Chest pain. COPD.  Left lung mass seen on chest radiograph.  CT CHEST WITH CONTRAST  Technique:  Multidetector CT imaging of the chest was performed following the standard protocol during bolus administration of intravenous contrast.  Contrast: 80mL OMNIPAQUE IOHEXOL 300 MG/ML  SOLN  Comparison: Chest radiograph earlier today  Findings: Severe pulmonary emphysema is demonstrated.  A large spiculated mass is seen in the left lung apex which abuts the anterior chest wall.  This measures 4.8 x 5.6 cm, and is consistent with bronchogenic carcinoma.  No definite evidence of direct chest wall invasion or pleural effusion.  No other suspicious pulmonary nodules masses are identified.  Right lower lobe scarring noted.  There is no evidence of hilar lymphadenopathy. A 6 mm lymph node is seen in the region of the AP window, which is not pathologically enlarged. No pathologically enlarged mediastinal nodes are seen. No other lymphadenopathy seen within the thorax.  No evidence of chest wall mass or  suspicious bone lesions.  Both adrenal glands are normal in appearance.  IMPRESSION:  1.  5.6 cm spiculated mass in the anterior left lung apex, consistent with bronchogenic carcinoma.  Consultation with the [facility] [phone]) should be considered. 2.  No definite evidence of metastatic disease. 3.  Severe pulmonary emphysema.   Original Report Authenticated By: Myles Rosenthal, M.D.    Mr Laqueta Jean UJ Contrast  08/18/2012  *RADIOLOGY REPORT*  Clinical Data: S R S targeting.  Follow-up metastatic disease to the brain.  MRI HEAD WITHOUT AND WITH CONTRAST  Technique:  Multiplanar, multiecho pulse sequences of the brain and surrounding structures were obtained according to standard protocol without and with intravenous contrast  Contrast:  11 ml Multihance  Comparison: 08/11/2012  Findings: The examination confirms the presence of a 5 mm metastasis at the right to parietal gray-white junction with a small amount of vasogenic edema and the presence of a 4 mm metastasis at the left parietal gray-white junction with associated edema.  Either of  these shows hemorrhage or mass effect.  Newly demonstrated is a 2 mm metastasis at the inferior cerebellum on the left, obscured by pulsation artifact on the previous exam.  No evidence of ischemic infarction, hydrocephalus or extra-axial collection.  No pituitary mass.  Sinuses, middle ears and mastoids are clear  IMPRESSION: Confirmation of a single metastasis in each parietal lobe, 5 mm on the right and 4 mm on the left.  Demonstration of a 2 mm metastasis at the inferior cerebellum on the left without edema.   Original Report Authenticated By: Paulina Fusi, M.D.    Mr Laqueta Jean ZO Contrast  08/11/2012  *RADIOLOGY REPORT*  Clinical Data: Lung cancer.  Dizziness.  MRI HEAD WITHOUT AND WITH CONTRAST  Technique:  Multiplanar, multiecho pulse sequences of the brain and surrounding structures were obtained according to standard protocol without and  with intravenous contrast  Contrast: 10mL MULTIHANCE GADOBENATE DIMEGLUMINE 529 MG/ML IV SOLN  Comparison: None.  Findings: There is a 5 mm metastasis in the right parietal lobe towards the vertex with surrounding vasogenic edema but no shift or hemorrhage.  There is a 4 mm metastasis at the left parietal region at the vertex with surrounding edema but no shift or hemorrhage. No other lesion is seen.  There are mild chronic small vessel changes affecting the hemispheric white matter.  No hydrocephalus or extra-axial collection.  No pituitary mass.  No inflammatory sinus disease.  No skull or skull base lesion.  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   Original Report Authenticated By: Paulina Fusi, M.D.    Nm Pet Image Initial (pi) Skull Base To Thigh  08/02/2012  *RADIOLOGY REPORT*  Clinical Data: Initial treatment strategy for lung mass.  NUCLEAR MEDICINE PET SKULL BASE TO THIGH  Fasting Blood Glucose:  92  Technique:  18.0 mCi F-18 FDG was injected intravenously. CT data was obtained and used for attenuation correction and anatomic localization only.  (This was not acquired as a diagnostic CT examination.) Additional exam technical data entered on technologist worksheet.  Comparison:  07/23/2012  Findings:  Neck: No hypermetabolic lymph nodes in the neck.  Chest:  5.9 x 6.1 cm left upper lobe mass (series 2/image 53), max SUV 12.8, compatible with primary bronchogenic neoplasm.  Mass abuts the anterior chest wall with suspected involvement of the left anterior first rib (series 2/image 53).  Underlying extensive centrilobular and paraseptal emphysematous changes with dominant bullae in the right lower lobe.  No hypermetabolic mediastinal or hilar nodes.  Coronary atherosclerosis.  Abdomen/Pelvis:  No abnormal hypermetabolic activity within the liver, pancreas, adrenal glands, or spleen.  No hypermetabolic lymph  nodes in the abdomen or pelvis.  Multiple bilateral nonobstructing renal calculi measuring up to 8 mm in the right upper pole and 6 mm in the left lower pole.  No hydronephrosis.  Skeleton:  Suspected left first rib involvement, as described above.  Otherwise, no focal hypermetabolism to suggest osseous metastases.  IMPRESSION: 6.1 cm left upper lobe mass, max SUV 12.8, compatible with primary bronchogenic neoplasm.  Mass abuts the anterior chest wall with suspected involvement of the left anterior 1st rib.  Otherwise, no evidence of metastatic disease.   Original Report Authenticated By: Charline Bills, M.D.    Ct Biopsy  08/17/2012  *RADIOLOGY REPORT*  Clinical Data: Left upper lobe lung mass.  The patient presents for biopsy.  CT GUIDED CORE BIOPSY OF LEFT LUNG  Sedation:   1.0 mg IV Versed;  25 mcg IV Fentanyl  Total Moderate Sedation Time: 12 minutes.  Procedure:  The procedure risks, benefits, and alternatives were explained to the patient.  Questions regarding the procedure were encouraged and answered.  The patient understands and consents to the procedure.  The left anterior chest wall was prepped with Betadine in a sterile fashion, and a sterile drape was applied covering the operative field.  A sterile gown and sterile gloves were used for the procedure.  Local anesthesia was provided with 1% Lidocaine.  CT was performed in a supine position to localize the anterior left upper lobe mass.  After confirming the site for biopsy, a 17 gauge needle was advanced under CT guidance into the lesion.  After confirming needle tip position, coaxial 18-gauge core biopsy samples were obtained.  Three samples were submitted in formalin. The outer needle was removed and additional CT images performed.  Complications: No pneumothorax.  Findings: Large anterior left upper lobe mass measures approximately 6.5 cm in greatest diameter.  Solid tissue was obtained from the lesion.  Postbiopsy imaging shows no pneumothorax  or hemorrhage.  IMPRESSION: CT guided core biopsy performed of a large left upper lobe lung mass.  Solid tissue samples were obtained.   Original Report Authenticated By: Irish Lack, M.D.       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  He will likely benefit from radiation therapy to his primary tumor site.  In addition prior to chest radiation, at this point, the patient would potentially benefit from radiotherapy to the brain. The options include whole brain irradiation versus stereotactic radiosurgery. There are pros and cons associated with each of these potential treatment options. Whole brain radiotherapy would treat the known metastatic deposits and help provide some reduction of risk for future brain metastases. However, whole brain radiotherapy carries potential risks including hair loss, subacute somnolence, and neurocognitive changes including a possible reduction in short-term memory. Whole brain radiotherapy also may carry a lower likelihood of tumor control at the treatment sites because of the low-dose used. Stereotactic radiosurgery carries a higher likelihood for local tumor control at the targeted sites with lower associated risk for neurocognitive changes such as memory loss. However, the use of stereotactic radiosurgery in this setting may leave the patient at increased risk for new brain metastases elsewhere in the brain as high as 50-60%. Accordingly, patients who receive stereotactic radiosurgery in this setting should undergo ongoing surveillance imaging with brain MRI more frequently in order to identify and treat new small brain metastases before they become symptomatic. Stereotactic radiosurgery does carry some different risks, including a risk of radionecrosis.  PLAN: Today, I reviewed the findings and workup thus far with the patient. We discussed the  dilemma regarding whole brain radiotherapy versus stereotactic radiosurgery. We discussed the pros and cons of each. We also discussed the logistics and delivery of each. We reviewed the results associated with each of the treatments described above. The patient seems to understand the treatment options and would like to proceed with stereotactic radiosurgery.  I spent 60 minutes minutes face to face with the patient and more than 50% of that time was spent in counseling and/or coordination of care.   ------------------------------------------------  [name]. Kathrynn Running, M.D.

## 2012-08-26 ENCOUNTER — Encounter: Payer: Self-pay | Admitting: Radiation Oncology

## 2012-08-26 ENCOUNTER — Ambulatory Visit
Admission: RE | Admit: 2012-08-26 | Discharge: 2012-08-26 | Disposition: A | Discharge status: Home / Self Care | Payer: Medicare Other | Source: Ambulatory Visit | Attending: Radiation Oncology | Admitting: Radiation Oncology

## 2012-08-26 VITALS — BP 127/62 | HR 80 | Temp 97.6°F

## 2012-08-26 DIAGNOSIS — C7931 Secondary malignant neoplasm of brain: Secondary | ICD-10-CM

## 2012-08-26 DIAGNOSIS — C7949 Secondary malignant neoplasm of other parts of nervous system: Secondary | ICD-10-CM

## 2012-08-26 MED ORDER — METHYLPREDNISOLONE 4 MG PO KIT: PACK | ORAL | Status: DC

## 2012-08-26 NOTE — Progress Notes (Addendum)
  Radiation Oncology         [phone] ________________________________  Stereotactic Treatment Procedure Note  Name: Luis Payne MRN: [id number]  Date: 08/26/2012  DOB: 1940/07/28  SPECIAL TREATMENT PROCEDURE  3D TREATMENT PLANNING AND DOSIMETRY:  The patient's radiation plan was reviewed and approved by neurosurgery and radiation oncology prior to treatment.  It showed 3-dimensional radiation distributions overlaid onto the planning CT/MRI image set.  The [facility] for the target structures as well as the organs at risk were reviewed. The documentation of the 3D plan and dosimetry are filed in the radiation oncology EMR.  NARRATIVE:  Luis Payne was brought to the [employer] stereotactic radiation treatment machine and placed supine on the CT couch. The head frame was applied, and the patient was set up for stereotactic radiosurgery.  Dr. Newell Coral from neurosurgery was present for the set-up and delivery  SIMULATION VERIFICATION:  In the couch zero-angle position, the patient underwent Exactrac imaging using the Brainlab system with orthogonal KV images.  These were carefully aligned and repeated to confirm treatment position for each of the isocenters.  The Exactrac snap film verification was repeated at each couch angle.  SPECIAL TREATMENT PROCEDURE: [name] received stereotactic radiosurgery to the following targets:  Right Parietal 5 mm target was treated using 3 Circular Arcs to a prescription dose of 20 Gy.  ExacTrac registration was performed for each couch angle.  The 90% isodose line was prescribed.  The 10 mm collimator was used.  Left Parietal 5 mm target was treated using 4 Dynamic Conformal Arcs to a prescription dose of 20 Gy.  ExacTrac registration was performed for each couch angle.  The 85.5% isodose line was prescribed.  STEREOTACTIC TREATMENT MANAGEMENT:  Following delivery, the patient was transported to nursing in stable condition and monitored for possible acute  effects.  Vital signs were recorded BP 143/67  Pulse 77  Temp(Src) 97.9 F (36.6 C)  SpO2 92%. The patient tolerated treatment without significant acute effects, and was discharged to home in stable condition.    PLAN: Follow-up in one month.  Patient given a medrol dosepak for mild post SRS cerebral edema, and he will return on 4/23 for CT simulation to treat his primary lung cancer.  We will request med-onc evaluation for the role of concurrent chemo.  ________________________________  [name]. Kathrynn Running, M.D.

## 2012-08-26 NOTE — Addendum Note (Signed)
Encounter addended by: Tessa Lerner, RN on: 08/26/2012  2:15 PM<BR>     Documentation filed: Inpatient Document Flowsheet

## 2012-08-26 NOTE — Progress Notes (Signed)
Mr. Epple here accompanied by his wife for post [facility] monitoring.  He denies dizziness, blurred vision, pain and nausea.  He is alert and oriented to person, place and time.  He was adivsed to contact nursing with any questions or concerns.  Call light in reach.

## 2012-08-26 NOTE — Addendum Note (Signed)
Encounter addended by: Tessa Lerner, RN on: 08/26/2012  2:19 PM<BR>     Documentation filed: Notes Section

## 2012-08-26 NOTE — Progress Notes (Signed)
Patient post Brain SRS treatment.Denis nausea/vomiting or headache.Understands to pick up dexamethasone prescription today which was sent electronically to [facility].Patient and spouse informed to call if has n/v or unrelieved pain.Given appointment card for Ct simulation next week.

## 2012-08-26 NOTE — Addendum Note (Signed)
Encounter addended by: Oneita Hurt, MD on: 08/26/2012  2:55 PM<BR>     Documentation filed: Notes Section

## 2012-08-30 ENCOUNTER — Telehealth: Payer: Self-pay | Admitting: Internal Medicine

## 2012-08-30 ENCOUNTER — Telehealth: Payer: Self-pay | Admitting: *Deleted

## 2012-08-30 NOTE — Telephone Encounter (Signed)
Neurosurgical consultation from Dr Newell Coral given to dr mkm to review

## 2012-08-30 NOTE — Progress Notes (Signed)
  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       Radiation treatment dates:   08/26/12  Site/dose/beams/energy:   A relocatable thermoplastic stereotactic head frame was used for precise immobilization.  The planning CT images were loaded into the planning software and fused with the patient's targeting 3T 1mm axial slice MRI scan for target delineation.  Dr. Newell Coral from neurosurgery was present for the set-up and delivery.  [name] received stereotactic radiosurgery to the following targets:  Right Parietal 5 mm target was treated using 3 Circular Arcs to a prescription dose of 20 Gy. ExacTrac registration was performed for each couch angle. The 90% isodose line was prescribed. The 10 mm collimator was used.  Left Parietal 5 mm target was treated using 4 Dynamic Conformal Arcs to a prescription dose of 20 Gy. ExacTrac registration was performed for each couch angle. The 85.5% isodose line was prescribed.  6 MV x-rays in the flattening filter free beam mode were used for all fields.  Narrative: The patient tolerated radiation treatment relatively well.   No acute complications occurred.  Plan: The patient has completed radiation treatment. The patient will return soon to begin concurrent chemo radiotherapy to the chest. ________________________________  [name]. Kathrynn Running, M.D.

## 2012-08-30 NOTE — Telephone Encounter (Signed)
PT SCHEDULED PER DANA, OK TO DOUBLEBOOK.

## 2012-08-31 NOTE — Progress Notes (Addendum)
  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  Identity was confirmed.  All relevant records and images related to the planned course of therapy were reviewed.  The patient freely provided informed written consent to proceed with treatment after reviewing the details related to the planned course of therapy. The consent form was witnessed and verified by the simulation staff.  Then, the patient was set-up in a stable reproducible  supine position for radiation therapy.  CT images were obtained.  Surface markings were placed.  The CT images were loaded into the planning software.  Then the target and avoidance structures were contoured.  Treatment planning then occurred.  The radiation prescription was entered and confirmed.  Then, I designed and supervised the construction of a total of 5 medically necessary complex treatment devices in the form of MLC apertures shaping radiation around target.  I have requested : 3D Simulation  I have requested a DVH of the following structures: target, lungs, esophagus, spinal cord and brachial plexus.  I have ordered:Nutrition Consult and CBC  SPECIAL TREATMENT PROCEDURE:  The planned course of therapy using radiation constitutes a special treatment procedure. Special care is required in the management of this patient for the following reasons.  I have requested : This treatment constitutes a Special Treatment Procedure for the following reason: [ Concurrent chemotherapy requiring careful monitoring for increased toxicities of treatment including weekly laboratory values.Marland Kitchen   PLAN:  The patient will receive 70.2 Gy in 26  fractions.  ________________________________  [name] Kathrynn Running, M.D.

## 2012-09-01 ENCOUNTER — Ambulatory Visit
Admission: RE | Admit: 2012-09-01 | Discharge: 2012-09-01 | Disposition: A | Discharge status: Home / Self Care | Payer: Medicare Other | Source: Ambulatory Visit | Attending: Radiation Oncology | Admitting: Radiation Oncology

## 2012-09-01 ENCOUNTER — Telehealth: Payer: Self-pay | Admitting: Radiation Oncology

## 2012-09-01 NOTE — Telephone Encounter (Signed)
Met w patient to discuss RO billing. Pt had no financial concerns today.  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

## 2012-09-07 ENCOUNTER — Encounter: Payer: Self-pay | Admitting: Internal Medicine

## 2012-09-07 ENCOUNTER — Ambulatory Visit (HOSPITAL_BASED_OUTPATIENT_CLINIC_OR_DEPARTMENT_OTHER): Payer: Medicare Other | Admitting: Internal Medicine

## 2012-09-07 ENCOUNTER — Ambulatory Visit: Payer: Medicare Other

## 2012-09-07 ENCOUNTER — Other Ambulatory Visit: Payer: Self-pay | Admitting: Medical Oncology

## 2012-09-07 ENCOUNTER — Other Ambulatory Visit (HOSPITAL_BASED_OUTPATIENT_CLINIC_OR_DEPARTMENT_OTHER): Payer: Medicare Other | Admitting: Lab

## 2012-09-07 DIAGNOSIS — C3430 Malignant neoplasm of lower lobe, unspecified bronchus or lung: Secondary | ICD-10-CM

## 2012-09-07 DIAGNOSIS — C7931 Secondary malignant neoplasm of brain: Secondary | ICD-10-CM

## 2012-09-07 DIAGNOSIS — C3490 Malignant neoplasm of unspecified part of unspecified bronchus or lung: Secondary | ICD-10-CM

## 2012-09-07 LAB — CBC WITH DIFFERENTIAL/PLATELET
BASO%: 0.3 % (ref 0.0–2.0)
MCHC: 34.2 g/dL (ref 32.0–36.0)
MONO#: 0.6 10*3/uL (ref 0.1–0.9)
NEUT#: 5.2 10*3/uL (ref 1.5–6.5)
RBC: 4.6 10*6/uL (ref 4.20–5.82)
RDW: 14.5 % (ref 11.0–14.6)
WBC: 7 10*3/uL (ref 4.0–10.3)
lymph#: 1.1 10*3/uL (ref 0.9–3.3)

## 2012-09-07 LAB — COMPREHENSIVE METABOLIC PANEL (CC13)
AST: 21 U/L (ref 5–34)
Albumin: 3 g/dL — ABNORMAL LOW (ref 3.5–5.0)
BUN: 26.6 mg/dL — ABNORMAL HIGH (ref 7.0–26.0)
Calcium: 9.4 mg/dL (ref 8.4–10.4)
Chloride: 105 mEq/L (ref 98–107)
Creatinine: 1.1 mg/dL (ref 0.7–1.3)
Glucose: 87 mg/dl (ref 70–99)
Potassium: 4.7 mEq/L (ref 3.5–5.1)

## 2012-09-07 MED ORDER — PROCHLORPERAZINE MALEATE 10 MG PO TABS: 10.0000 mg | ORAL_TABLET | Freq: Four times a day (QID) | ORAL | Status: DC | PRN

## 2012-09-07 MED ORDER — DEXAMETHASONE 4 MG PO TABS
ORAL_TABLET | ORAL | Status: DC
Start: 2012-09-07 — End: 2012-10-17

## 2012-09-07 MED ORDER — OXYCODONE-ACETAMINOPHEN 5-325 MG PO TABS: 1.0000 | ORAL_TABLET | Freq: Four times a day (QID) | ORAL | Status: DC | PRN

## 2012-09-07 MED ORDER — CYANOCOBALAMIN 1000 MCG/ML IJ SOLN
1000.0000 ug | Freq: Once | INTRAMUSCULAR | Status: AC
  Administered 2012-09-07: 1000 ug via INTRAMUSCULAR

## 2012-09-07 MED ORDER — FOLIC ACID 1 MG PO TABS: 1.0000 mg | ORAL_TABLET | Freq: Every day | ORAL | Status: DC

## 2012-09-07 NOTE — Progress Notes (Signed)
[facility] Telephone:([phone]) [phone]   Fax:(336) [phone]  CONSULT NOTE  REFERRING PHYSICIAN: Dr. Margaretmary Dys  REASON FOR CONSULTATION:  72 years old white male recently diagnosed with lung cancer.  HPI Luis Payne is a 72 y.o. male was past medical history significant only for hearing deficit and broken wrist as well as long history of smoking. The patient was seen by his primary care physician in the middle of March 2004 team complaining of pain in the left elbow and shoulder area. He had an EKG performed that was unremarkable. The patient was sent to the emergency Department for further evaluation and chest x-ray was performed on 07/23/2012 and it showed 6.4 cm mass-like opacity in the left upper lobe, worrisome for primary bronchogenic neoplasm. This was followed by CT scan of the chest with contrast on 07/23/2012 and it showed 5.6 cm spiculated mass in the anterior left lung apex, consistent with bronchogenic carcinoma. No definite evidence of metastatic disease. On 07/30/2012 the patient had a PET scan performed and it showed 5.9 x 6.1 CM left upper lobe mass with maximum SUV of 12.8 compatible with primary bronchogenic neoplasm the mass abutted the anterior chest wound with suspected involvement of the left anterior first rib. MRI of the brain on 08/11/2012 showed 5 mm metastasis in the right parietal lobe and 3 mm metastasis in the left parietal lobe, each with surrounding vasogenic edema. On 8 2014 the patient underwent CT-guided core biopsy of the large left upper lobe lung mass by interventional radiology. The final pathology was positive for adenocarcinoma. The tumor cells were negative for EGFR mutation as well as ALK gene translocation.  The patient was seen by Dr. Kathrynn Running and underwent stereotactic radiotherapy to the two small brain lesions, completed on 08/26/2012. Dr. Kathrynn Running is also considering the patient for a course of palliative radiotherapy to the left  upper lobe lung mass. The patient is here today for evaluation and discussion of her systemic treatment options.  He is feeling fine today except for pain in the left arm and shoulder area. He also has mild dry cough and shortness breath with exertion. He 3 pounds over the last few months but started gaining weight again. Family history significant for her mother who is alive at age 3, father died from COPD at age 63, sister with breast cancer and brother with heart disease. The patient is married and has 2 children. He was accompanied today by his wife [name] and his son Fitzhenry. The patient is currently retired and used to work as a Electrical engineer. He has a history of smoking more than one pack per day for around 60 years and quit one month ago. He denied having any history of alcohol or drug abuse.  @SFHPI @  Past Medical History  Diagnosis Date  . Substance abuse     TOBACCO  . Lung mass   . 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 Brain metastases 08/18/12    mri-  . HOH (hard of hearing)     wears hearing aids  . COPD (chronic obstructive pulmonary disease)     Past Surgical History  Procedure Laterality Date  . Wrist surgery    . Lung biopsy Left 08/17/12    LUL lung mass-adenocarcinoma    Family History  Problem Relation Age of Onset  . COPD Father   . Cancer Sister   . Diabetes Brother     Social History History  Substance Use Topics  . Smoking status: Current Every Day Smoker -- 1.00 packs/day    Types: Cigarettes  . Smokeless tobacco: Never Used     Comment: TRING TO QUIT...DOWN TO 1 PACK PER WEEK  . Alcohol Use: No    No Known Allergies  Current Outpatient Prescriptions  Medication Sig Dispense Refill  . Ascorbic Acid (VITAMIN C) 500 MG CAPS Take 1 capsule by mouth every morning.      . cholecalciferol (VITAMIN D) 1000 UNITS tablet Take 1,000 Units by mouth every morning.      Marland Kitchen ibuprofen (ADVIL,MOTRIN) 200 MG tablet Take 200-400 mg by  mouth every 8 (eight) hours as needed for pain.       Marland Kitchen loratadine (CLARITIN) 10 MG tablet Take 10 mg by mouth daily.      . Multiple Vitamin (MULTIVITAMIN WITH MINERALS) TABS Take 1 tablet by mouth every morning.      Marland Kitchen dexamethasone (DECADRON) 4 MG tablet Milligram by mouth twice a day the day before, day of and day after the chemotherapy every 3 weeks.  30 tablet  1  . diphenhydrAMINE (BENADRYL) 25 MG tablet Take 50 mg by mouth at bedtime.      . folic acid (FOLVITE) 1 MG tablet Take 1 tablet (1 mg total) by mouth daily.  30 tablet  4  . oxyCODONE-acetaminophen (PERCOCET/ROXICET) 5-325 MG per tablet Take 1 tablet by mouth every 6 (six) hours as needed for pain.  30 tablet  0  . prochlorperazine (COMPAZINE) 10 MG tablet Take 1 tablet (10 mg total) by mouth every 6 (six) hours as needed.  60 tablet  0   No current facility-administered medications for this visit.    Review of Systems  A comprehensive review of systems was negative except for: Constitutional: positive for fatigue and weight loss Respiratory: positive for cough and dyspnea on exertion  Physical Exam  ZOX:WRUEA, healthy, no distress, well nourished and well developed SKIN: skin color, texture, turgor are normal HEAD: Normocephalic, No masses, lesions, tenderness or abnormalities EYES: normal, PERRLA EARS: External ears normal OROPHARYNX:no exudate and no erythema  NECK: supple, no adenopathy LYMPH:  no palpable lymphadenopathy LUNGS: clear to auscultation  HEART: regular rate & rhythm and no murmurs ABDOMEN:abdomen soft, non-tender, normal bowel sounds and no masses or organomegaly BACK: Back symmetric, no curvature. EXTREMITIES:no joint deformities, effusion, or inflammation, no edema, no skin discoloration, no clubbing  NEURO: alert & oriented x 3 with fluent speech, no focal motor/sensory deficits  PERFORMANCE STATUS: ECOG 1  LABORATORY DATA: Lab Results  Component Value Date   WBC 7.0 09/07/2012   HGB 14.5  09/07/2012   HCT 42.3 09/07/2012   MCV 92.1 09/07/2012   PLT 206 09/07/2012      Chemistry      Component Value Date/Time   NA 140 09/07/2012 0949   NA 135 07/23/2012 1445   K 4.7 09/07/2012 0949   K 4.0 07/23/2012 1445   CL 105 09/07/2012 0949   CL 99 07/23/2012 1445   CO2 29 09/07/2012 0949   CO2 25 07/23/2012 1445   BUN 26.6* 09/07/2012 0949   BUN 20 07/23/2012 1445   CREATININE 1.1 09/07/2012 0949   CREATININE 1.00 07/23/2012 1445      Component Value Date/Time   CALCIUM 9.4 09/07/2012 0949   CALCIUM 9.7 07/23/2012 1445   ALKPHOS 107 09/07/2012 0949   AST 21 09/07/2012 0949   ALT 27 09/07/2012 0949   BILITOT 0.36 09/07/2012 0949  RADIOGRAPHIC STUDIES: Mr Laqueta Jean ZO Contrast  2012-08-27  *RADIOLOGY REPORT*  Clinical Data: S R S targeting.  Follow-up metastatic disease to the brain.  MRI HEAD WITHOUT AND WITH CONTRAST  Technique:  Multiplanar, multiecho pulse sequences of the brain and surrounding structures were obtained according to standard protocol without and with intravenous contrast  Contrast:  11 ml Multihance  Comparison: 08/11/2012  Findings: The examination confirms the presence of a 5 mm metastasis at the right to parietal gray-white junction with a small amount of vasogenic edema and the presence of a 4 mm metastasis at the left parietal gray-white junction with associated edema.  Either of these shows hemorrhage or mass effect.  Newly demonstrated is a 2 mm metastasis at the inferior cerebellum on the left, obscured by pulsation artifact on the previous exam.  No evidence of ischemic infarction, hydrocephalus or extra-axial collection.  No pituitary mass.  Sinuses, middle ears and mastoids are clear  IMPRESSION: Confirmation of a single metastasis in each parietal lobe, 5 mm on the right and 4 mm on the left.  Demonstration of a 2 mm metastasis at the inferior cerebellum on the left without edema.   Original Report Authenticated By: Paulina Fusi, M.D.    Mr Laqueta Jean XW  Contrast  08/11/2012  *RADIOLOGY REPORT*  Clinical Data: Lung cancer.  Dizziness.  MRI HEAD WITHOUT AND WITH CONTRAST  Technique:  Multiplanar, multiecho pulse sequences of the brain and surrounding structures were obtained according to standard protocol without and with intravenous contrast  Contrast: 10mL MULTIHANCE GADOBENATE DIMEGLUMINE 529 MG/ML IV SOLN  Comparison: None.  Findings: There is a 5 mm metastasis in the right parietal lobe towards the vertex with surrounding vasogenic edema but no shift or hemorrhage.  There is a 4 mm metastasis at the left parietal region at the vertex with surrounding edema but no shift or hemorrhage. No other lesion is seen.  There are mild chronic small vessel changes affecting the hemispheric white matter.  No hydrocephalus or extra-axial collection.  No pituitary mass.  No inflammatory sinus disease.  No skull or skull base lesion.  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   Original Report Authenticated By: Paulina Fusi, M.D.    Ct Biopsy  08/17/2012  *RADIOLOGY REPORT*  Clinical Data: Left upper lobe lung mass.  The patient presents for biopsy.  CT GUIDED CORE BIOPSY OF LEFT LUNG  Sedation:   1.0 mg IV Versed;  25 mcg IV Fentanyl  Total Moderate Sedation Time: 12 minutes.  Procedure:  The procedure risks, benefits, and alternatives were explained to the patient.  Questions regarding the procedure were encouraged and answered.  The patient understands and consents to the procedure.  The left anterior chest wall was prepped with Betadine in a sterile fashion, and a sterile drape was applied covering the operative field.  A sterile gown and sterile gloves were used for the procedure.  Local anesthesia was provided with 1% Lidocaine.  CT was performed in a supine position to localize the anterior left upper lobe mass.  After confirming the site for biopsy, a 17 gauge needle was  advanced under CT guidance into the lesion.  After confirming needle tip position, coaxial 18-gauge core biopsy samples were obtained.  Three samples were submitted in formalin. The outer needle was removed and additional CT images performed.  Complications: No pneumothorax.  Findings: Large anterior left upper  lobe mass measures approximately 6.5 cm in greatest diameter.  Solid tissue was obtained from the lesion.  Postbiopsy imaging shows no pneumothorax or hemorrhage.  IMPRESSION: CT guided core biopsy performed of a large left upper lobe lung mass.  Solid tissue samples were obtained.   Original Report Authenticated By: Irish Lack, M.D.     ASSESSMENT: This is a very pleasant 72 years old white male recently diagnosed with metastatic non-small cell lung cancer, adenocarcinoma with negative EGFR mutation and negative ALK gene translocation status post a stereotactic radiotherapy to the 2 brain lesions.  PLAN: I have a lengthy discussion with the patient and his family today about his disease stage, prognosis and treatment options. I recommended for the patient to proceed with systemic chemotherapy with carboplatin for AUC of 5 and Alimta 500 mg/M2 every 3 weeks. This can be concurrent with radiotherapy to the right upper lobe lung mass under the care of Dr. Kathrynn Running. The patient was also given him the option of palliative care and hospice but he would like to proceed with treatment. I discussed with the patient adverse effect of the chemotherapy including but not limited to alopecia, myelosuppression, nausea vomiting, peripheral neuropathy, liver or renal dysfunction. I expect the patient to start the first cycle of his treatment next week. He would receive vitamin B12 injection today.. I will call his pharmacy with prescription for Compazine 10 mg by mouth every 6 hours as needed for nausea, folic acid 1 mg by mouth daily in addition to Decadron 4 mg by mouth twice a day the day before, day of and  day after the chemotherapy. The patient will have a chemotherapy education class before starting the first cycle of his chemotherapy. He would come back for followup visit in 2 weeks for reevaluation and management any adverse effect of his chemotherapy. All questions were answered. The patient knows to call the clinic with any problems, questions or concerns. We can certainly see the patient much sooner if necessary.  Thank you so much for allowing me to participate in the care of [name]. I will continue to follow up the patient with you and assist in his care.  I spent 40 minutes counseling the patient face to face. The total time spent in the appointment was 60 minutes.  Alverna Fawley K. 09/07/2012, 7:00 PM

## 2012-09-07 NOTE — Progress Notes (Signed)
Checked in new patient. No financial issues. °

## 2012-09-07 NOTE — Patient Instructions (Signed)
You are recently diagnosed with stage IV non-small cell lung cancer. We discussed treatment options including systemic chemotherapy with carboplatin and Alimta in addition to palliative radiotherapy to the left upper lobe lung mass. Followup in 2 weeks.

## 2012-09-07 NOTE — Progress Notes (Signed)
erroneous

## 2012-09-08 ENCOUNTER — Ambulatory Visit
Admission: RE | Admit: 2012-09-08 | Discharge: 2012-09-08 | Disposition: A | Discharge status: Home / Self Care | Payer: Medicare Other | Source: Ambulatory Visit | Attending: Radiation Oncology | Admitting: Radiation Oncology

## 2012-09-08 NOTE — Progress Notes (Signed)
  Radiation Oncology         [phone] ________________________________  Name: Luis Payne MRN: [id number]  Date: 09/08/2012  DOB: 12/22/1940  Simulation Verification Note  Status: outpatient  NARRATIVE: The patient was brought to the treatment unit and placed in the planned treatment position. The clinical setup was verified. Then port films were obtained and uploaded to the radiation oncology medical record software.  The treatment beams were carefully compared against the planned radiation fields. The position location and shape of the radiation fields was reviewed. They targeted volume of tissue appears to be appropriately covered by the radiation beams. Organs at risk appear to be excluded as planned.  Based on my personal review, I approved the simulation verification. The patient's treatment will proceed as planned.  ------------------------------------------------  [name] Kathrynn Running, M.D.

## 2012-09-09 ENCOUNTER — Telehealth: Payer: Self-pay | Admitting: Internal Medicine

## 2012-09-09 ENCOUNTER — Ambulatory Visit
Admission: RE | Admit: 2012-09-09 | Discharge: 2012-09-09 | Disposition: A | Discharge status: Home / Self Care | Payer: Medicare Other | Source: Ambulatory Visit | Attending: Radiation Oncology | Admitting: Radiation Oncology

## 2012-09-09 ENCOUNTER — Other Ambulatory Visit: Payer: Medicare Other

## 2012-09-10 ENCOUNTER — Ambulatory Visit
Admission: RE | Admit: 2012-09-10 | Discharge: 2012-09-10 | Disposition: A | Discharge status: Home / Self Care | Payer: Medicare Other | Source: Ambulatory Visit | Attending: Radiation Oncology | Admitting: Radiation Oncology

## 2012-09-10 ENCOUNTER — Encounter: Payer: Self-pay | Admitting: Radiation Oncology

## 2012-09-10 MED ORDER — RADIAPLEXRX EX GEL
Freq: Once | CUTANEOUS | Status: AC
  Administered 2012-09-10: 09:00:00 via TOPICAL

## 2012-09-10 NOTE — Progress Notes (Signed)
patioent here weekly rad tx to chest, patient educationdone, radiaplex gel and radiation therapy and you book given, patient alert,oriented x3, steady gait, no c/p pain,. Sob, 96% room air sats, no c/o nausea,h/a, slight fatigue, no c/o pain took pain med last night stated, has loose congestive cough, coughed up clear pheglm this am, discussed fatigue, throat, skin irritation, to apply radaiplex gel on chest after rad txs when skin starts itching, erythema, patient gave verbal understanding 8:58 AM

## 2012-09-10 NOTE — Progress Notes (Signed)
  Radiation Oncology         [phone] ________________________________  Name: Luis Payne MRN: [id number]  Date: 09/10/2012  DOB: Aug 06, 1940  Weekly Radiation Therapy Management  Current Dose: 5.4 Gy     Planned Dose:  70.2 Gy  Narrative . . . . . . . . The patient presents for routine under treatment assessment.                                               The patient is without complaint.                                 Set-up films were reviewed.                                 The chart was checked. Physical Findings. . .  weight is 126 lb 11.2 oz (57.471 kg). His oral temperature is 98.3 F (36.8 C). His blood pressure is 143/81 and his pulse is 86. His respiration is 20 and oxygen saturation is 96%. . Weight essentially stable.  No significant changes. Impression . . . . . . . The patient is tolerating radiation. Plan . . . . . . . . . . . . Continue treatment as planned.  ________________________________  [name]. Kathrynn Running, M.D.

## 2012-09-10 NOTE — Addendum Note (Signed)
Encounter addended by: Oneita Hurt, MD on: 09/10/2012  9:39 AM<BR>     Documentation filed: Notes Section

## 2012-09-13 ENCOUNTER — Other Ambulatory Visit (HOSPITAL_BASED_OUTPATIENT_CLINIC_OR_DEPARTMENT_OTHER): Payer: Medicare Other | Admitting: Lab

## 2012-09-13 ENCOUNTER — Ambulatory Visit (HOSPITAL_BASED_OUTPATIENT_CLINIC_OR_DEPARTMENT_OTHER): Payer: Medicare Other

## 2012-09-13 ENCOUNTER — Ambulatory Visit
Admission: RE | Admit: 2012-09-13 | Discharge: 2012-09-13 | Disposition: A | Discharge status: Home / Self Care | Payer: Medicare Other | Source: Ambulatory Visit | Attending: Radiation Oncology | Admitting: Radiation Oncology

## 2012-09-13 DIAGNOSIS — C3410 Malignant neoplasm of upper lobe, unspecified bronchus or lung: Secondary | ICD-10-CM

## 2012-09-13 DIAGNOSIS — Z5111 Encounter for antineoplastic chemotherapy: Secondary | ICD-10-CM

## 2012-09-13 LAB — COMPREHENSIVE METABOLIC PANEL (CC13)
ALT: 25 U/L (ref 0–55)
AST: 17 U/L (ref 5–34)
BUN: 25.1 mg/dL (ref 7.0–26.0)
Creatinine: 1 mg/dL (ref 0.7–1.3)
Total Bilirubin: 0.4 mg/dL (ref 0.20–1.20)

## 2012-09-13 LAB — CBC WITH DIFFERENTIAL/PLATELET
BASO%: 0 % (ref 0.0–2.0)
LYMPH%: 5.6 % — ABNORMAL LOW (ref 14.0–49.0)
MCH: 31.2 pg (ref 27.2–33.4)
MCHC: 33.8 g/dL (ref 32.0–36.0)
MCV: 92.3 fL (ref 79.3–98.0)
MONO%: 2.8 % (ref 0.0–14.0)
Platelets: 254 10*3/uL (ref 140–400)
RBC: 4.17 10*6/uL — ABNORMAL LOW (ref 4.20–5.82)
WBC: 9 10*3/uL (ref 4.0–10.3)
nRBC: 0 % (ref 0–0)

## 2012-09-13 MED ORDER — ONDANSETRON 16 MG/50ML IVPB (CHCC)
16.0000 mg | Freq: Once | INTRAVENOUS | Status: AC
  Administered 2012-09-13: 16 mg via INTRAVENOUS

## 2012-09-13 MED ORDER — SODIUM CHLORIDE 0.9 % IV SOLN
500.0000 mg/m2 | Freq: Once | INTRAVENOUS | Status: AC
  Administered 2012-09-13: 800 mg via INTRAVENOUS
  Filled 2012-09-13: qty 32

## 2012-09-13 MED ORDER — SODIUM CHLORIDE 0.9 % IV SOLN
360.0000 mg | Freq: Once | INTRAVENOUS | Status: AC
  Administered 2012-09-13: 360 mg via INTRAVENOUS
  Filled 2012-09-13: qty 36

## 2012-09-13 MED ORDER — SODIUM CHLORIDE 0.9 % IV SOLN
Freq: Once | INTRAVENOUS | Status: AC
  Administered 2012-09-13: 09:00:00 via INTRAVENOUS

## 2012-09-13 MED ORDER — DEXAMETHASONE SODIUM PHOSPHATE 20 MG/5ML IJ SOLN
20.0000 mg | Freq: Once | INTRAMUSCULAR | Status: AC
  Administered 2012-09-13: 20 mg via INTRAVENOUS

## 2012-09-13 NOTE — Patient Instructions (Signed)
[facility] Discharge Instructions for Patients Receiving Chemotherapy  Today you received the following chemotherapy agents: Alimta and Carboplatin.  To help prevent nausea and vomiting after your treatment, we encourage you to take your nausea medication, Compazine. Take it every six hours as needed for nausea. This may make you drowsy.   If you develop nausea and vomiting that is not controlled by your nausea medication, call the clinic. If it is after clinic hours your family physician or the after hours number for the clinic or go to the Emergency Department.   BELOW ARE SYMPTOMS THAT SHOULD BE REPORTED IMMEDIATELY:  *FEVER GREATER THAN 100.5 F  *CHILLS WITH OR WITHOUT FEVER  NAUSEA AND VOMITING THAT IS NOT CONTROLLED WITH YOUR NAUSEA MEDICATION  *UNUSUAL SHORTNESS OF BREATH  *UNUSUAL BRUISING OR BLEEDING  TENDERNESS IN MOUTH AND THROAT WITH OR WITHOUT PRESENCE OF ULCERS  *URINARY PROBLEMS  *BOWEL PROBLEMS  UNUSUAL RASH Items with * indicate a potential emergency and should be followed up as soon as possible.  One of the nurses will contact you 24 hours after your treatment. Please let the nurse know about any problems that you may have experienced. Feel free to call the clinic you have any questions or concerns. The clinic phone number is [phone].   I have been informed and understand all the instructions given to me. I know to contact the clinic, my physician, or go to the Emergency Department if any problems should occur. I do not have any questions at this time, but understand that I may call the clinic during office hours   should I have any questions or need assistance in obtaining follow up care.    

## 2012-09-14 ENCOUNTER — Ambulatory Visit
Admission: RE | Admit: 2012-09-14 | Discharge: 2012-09-14 | Disposition: A | Discharge status: Home / Self Care | Payer: Medicare Other | Source: Ambulatory Visit | Attending: Radiation Oncology | Admitting: Radiation Oncology

## 2012-09-14 ENCOUNTER — Telehealth: Payer: Self-pay | Admitting: *Deleted

## 2012-09-14 NOTE — Telephone Encounter (Signed)
Called and spoke with wife, she states that patient has been doing great, no fever/chills/nausea/vomiting/diarrhea. Patient is currently unavailable, he is out cutting the grass. He cleaned out the gutters today as well. Informed wife to tell patient to take it easy, don't over exert himself. She states she will "attempt" to reinforce this.

## 2012-09-14 NOTE — Telephone Encounter (Signed)
Message copied by Kathlynn Grate on Tue Sep 14, 2012  5:24 PM ------      Message from: Caleb Popp      Created: Mon Sep 13, 2012  8:56 AM      Regarding: chemo follow up call      Contact: [phone]       Alimta/ Carbo 5/5 with concurrent XRT. Call in afternoon pls. [facility])  ------

## 2012-09-15 ENCOUNTER — Ambulatory Visit
Admission: RE | Admit: 2012-09-15 | Discharge: 2012-09-15 | Disposition: A | Discharge status: Home / Self Care | Payer: Medicare Other | Source: Ambulatory Visit | Attending: Radiation Oncology | Admitting: Radiation Oncology

## 2012-09-16 ENCOUNTER — Ambulatory Visit
Admission: RE | Admit: 2012-09-16 | Discharge: 2012-09-16 | Disposition: A | Discharge status: Home / Self Care | Payer: Medicare Other | Source: Ambulatory Visit | Attending: Radiation Oncology | Admitting: Radiation Oncology

## 2012-09-17 ENCOUNTER — Ambulatory Visit
Admission: RE | Admit: 2012-09-17 | Discharge: 2012-09-17 | Disposition: A | Discharge status: Home / Self Care | Payer: Medicare Other | Source: Ambulatory Visit | Attending: Radiation Oncology | Admitting: Radiation Oncology

## 2012-09-17 ENCOUNTER — Encounter: Payer: Self-pay | Admitting: Radiation Oncology

## 2012-09-17 NOTE — Progress Notes (Signed)
Pt reprots dry hacking cough, took Robitussin OTC 2 nights ago w/good relief. He denies SOB except w/extended coughing, denies pain, fatigue, loss of appetite. Pt applying Radiaplex to left chest treatment area, no skin changes at this time.

## 2012-09-17 NOTE — Progress Notes (Signed)
   Department of Radiation Oncology  Phone:  [phone] Fax:        [phone]  Weekly Treatment Note    Name: Luis Payne Date: 09/17/2012 MRN: [id number] DOB: 03/29/1941   Current dose: 18.9 Gy  Current fraction: 7   MEDICATIONS: Current Outpatient Prescriptions  Medication Sig Dispense Refill  . Ascorbic Acid (VITAMIN C) 500 MG CAPS Take 1 capsule by mouth every morning.      . cholecalciferol (VITAMIN D) 1000 UNITS tablet Take 1,000 Units by mouth every morning.      Marland Kitchen dexamethasone (DECADRON) 4 MG tablet Milligram by mouth twice a day the day before, day of and day after the chemotherapy every 3 weeks.  30 tablet  1  . diphenhydrAMINE (BENADRYL) 25 MG tablet Take 50 mg by mouth at bedtime.      . folic acid (FOLVITE) 1 MG tablet Take 1 tablet (1 mg total) by mouth daily.  30 tablet  4  . hyaluronate sodium (RADIAPLEXRX) GEL Apply 1 application topically 2 (two) times daily. Apply after rad txs daily one skin becomes irritated,itching or red      . loratadine (CLARITIN) 10 MG tablet Take 10 mg by mouth daily.      . Multiple Vitamin (MULTIVITAMIN WITH MINERALS) TABS Take 1 tablet by mouth every morning.      Marland Kitchen oxyCODONE-acetaminophen (PERCOCET/ROXICET) 5-325 MG per tablet Take 1 tablet by mouth every 6 (six) hours as needed for pain.  30 tablet  0  . prochlorperazine (COMPAZINE) 10 MG tablet Take 1 tablet (10 mg total) by mouth every 6 (six) hours as needed.  60 tablet  0   No current facility-administered medications for this encounter.     ALLERGIES: Review of patient's allergies indicates no known allergies.   LABORATORY DATA:  Lab Results  Component Value Date   WBC 9.0 09/13/2012   HGB 13.0 09/13/2012   HCT 38.5 09/13/2012   MCV 92.3 09/13/2012   PLT 254 09/13/2012   Lab Results  Component Value Date   NA 139 09/13/2012   K 4.3 09/13/2012   CL 104 09/13/2012   CO2 25 09/13/2012   Lab Results  Component Value Date   ALT 25 09/13/2012   AST 17 09/13/2012   ALKPHOS 108  09/13/2012   BILITOT 0.40 09/13/2012     NARRATIVE: Luis Payne was seen today for weekly treatment management. The chart was checked and the patient's films were reviewed. The patient had a cough and experienced good relief with Robitussin over-the-counter. The patient denies any worsening shortness of breath. Energy level has been good. He is using skin cream in the chest area.  PHYSICAL EXAMINATION: weight is 128 lb 6.4 oz (58.242 kg). His oral temperature is 98.1 F (36.7 C). His blood pressure is 92/65 and his pulse is 83. His respiration is 20 and oxygen saturation is 100%.      minimal skin reaction at this time.  ASSESSMENT: The patient is doing satisfactorily with treatment.  PLAN: We will continue with the patient's radiation treatment as planned.

## 2012-09-18 ENCOUNTER — Ambulatory Visit
Admission: RE | Admit: 2012-09-18 | Discharge: 2012-09-18 | Disposition: A | Discharge status: Home / Self Care | Payer: Medicare Other | Source: Ambulatory Visit | Attending: Radiation Oncology | Admitting: Radiation Oncology

## 2012-09-20 ENCOUNTER — Ambulatory Visit
Admission: RE | Admit: 2012-09-20 | Discharge: 2012-09-20 | Disposition: A | Discharge status: Home / Self Care | Payer: Medicare Other | Source: Ambulatory Visit | Attending: Radiation Oncology | Admitting: Radiation Oncology

## 2012-09-21 ENCOUNTER — Ambulatory Visit (HOSPITAL_BASED_OUTPATIENT_CLINIC_OR_DEPARTMENT_OTHER): Payer: Medicare Other | Admitting: Internal Medicine

## 2012-09-21 ENCOUNTER — Other Ambulatory Visit (HOSPITAL_BASED_OUTPATIENT_CLINIC_OR_DEPARTMENT_OTHER): Payer: Medicare Other | Admitting: Lab

## 2012-09-21 ENCOUNTER — Ambulatory Visit
Admission: RE | Admit: 2012-09-21 | Discharge: 2012-09-21 | Disposition: A | Discharge status: Home / Self Care | Payer: Medicare Other | Source: Ambulatory Visit | Attending: Radiation Oncology | Admitting: Radiation Oncology

## 2012-09-21 ENCOUNTER — Encounter: Payer: Self-pay | Admitting: Internal Medicine

## 2012-09-21 DIAGNOSIS — C3490 Malignant neoplasm of unspecified part of unspecified bronchus or lung: Secondary | ICD-10-CM

## 2012-09-21 LAB — CBC WITH DIFFERENTIAL/PLATELET
BASO%: 0.4 % (ref 0.0–2.0)
Basophils Absolute: 0 10*3/uL (ref 0.0–0.1)
HCT: 36.4 % — ABNORMAL LOW (ref 38.4–49.9)
HGB: 12.3 g/dL — ABNORMAL LOW (ref 13.0–17.1)
LYMPH%: 17.6 % (ref 14.0–49.0)
MCH: 31 pg (ref 27.2–33.4)
MCHC: 33.8 g/dL (ref 32.0–36.0)
MONO#: 0.3 10*3/uL (ref 0.1–0.9)
NEUT%: 71.7 % (ref 39.0–75.0)
Platelets: 161 10*3/uL (ref 140–400)
WBC: 2.8 10*3/uL — ABNORMAL LOW (ref 4.0–10.3)

## 2012-09-21 NOTE — Patient Instructions (Signed)
Continue palliative radiotherapy as scheduled. Follow up visit with the next cycle of chemotherapy

## 2012-09-21 NOTE — Progress Notes (Signed)
[facility] Telephone:(336) [phone]   Fax:(336) [phone]  OFFICE PROGRESS NOTE  Cassell Smiles., MD [address] [place] [address]  DIAGNOSIS: Metastatic non-small cell lung cancer, adenocarcinoma with negative EGFR mutation and negative ALK gene translocation diagnosed in April 2014  PRIOR THERAPY: Status post stereotactic radiotherapy to 2 brain lesions under the care of Dr. Kathrynn Running  CURRENT THERAPY: Systemic chemotherapy with carboplatin for AUC of 5 and Alimta 500 mg/M2 every 3 weeks, status post 1 cycle. First dose was given on May, 05, 2014  INTERVAL HISTORY: Luis Payne 72 y.o. male returns to the clinic today for follow up visit accompanied by his wife. The patient related the first cycle of his systemic chemotherapy with carboplatin and Alimta fairly well with no significant adverse effects. He denied having any significant fever or chills, no nausea or vomiting. The patient denied having any significant weight loss or night sweats. He has no significant chest pain but continues to have shortness breath with exertion, no cough or hemoptysis. He is also undergoing palliative radiotherapy to the left upper lobe lung mass under the care of Dr. Kathrynn Running, expected to be completed on 10/15/2012.   MEDICAL HISTORY: Past Medical History  Diagnosis Date  . Substance abuse     TOBACCO  . Lung mass   . 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 Brain metastases 08/18/12    mri-  . HOH (hard of hearing)     wears hearing aids  . COPD (chronic obstructive pulmonary disease)     ALLERGIES:  has No Known Allergies.  MEDICATIONS:  Current Outpatient Prescriptions  Medication Sig Dispense Refill  . Ascorbic Acid (VITAMIN C) 500 MG CAPS Take 1 capsule by mouth every morning.      . cholecalciferol (VITAMIN D) 1000 UNITS tablet Take 1,000 Units by mouth every morning.      Marland Kitchen dexamethasone (DECADRON) 4 MG tablet Milligram by mouth twice a  day the day before, day of and day after the chemotherapy every 3 weeks.  30 tablet  1  . diphenhydrAMINE (BENADRYL) 25 MG tablet Take 50 mg by mouth at bedtime.      . folic acid (FOLVITE) 1 MG tablet Take 1 tablet (1 mg total) by mouth daily.  30 tablet  4  . hyaluronate sodium (RADIAPLEXRX) GEL Apply 1 application topically 2 (two) times daily. Apply after rad txs daily one skin becomes irritated,itching or red      . loratadine (CLARITIN) 10 MG tablet Take 10 mg by mouth daily.      . Multiple Vitamin (MULTIVITAMIN WITH MINERALS) TABS Take 1 tablet by mouth every morning.      Marland Kitchen oxyCODONE-acetaminophen (PERCOCET/ROXICET) 5-325 MG per tablet Take 1 tablet by mouth every 6 (six) hours as needed for pain.  30 tablet  0  . prochlorperazine (COMPAZINE) 10 MG tablet Take 1 tablet (10 mg total) by mouth every 6 (six) hours as needed.  60 tablet  0   No current facility-administered medications for this visit.    SURGICAL HISTORY:  Past Surgical History  Procedure Laterality Date  . Wrist surgery    . Lung biopsy Left 08/17/12    LUL lung mass-adenocarcinoma    REVIEW OF SYSTEMS:  A comprehensive review of systems was negative except for: Respiratory: positive for dyspnea on exertion   PHYSICAL EXAMINATION: General appearance: alert, cooperative, fatigued and no distress Head: Normocephalic, without obvious abnormality, atraumatic  Neck: no adenopathy Lymph nodes: Cervical, supraclavicular, and axillary nodes normal. Resp: clear to auscultation bilaterally Cardio: regular rate and rhythm, S1, S2 normal, no murmur, click, rub or gallop GI: soft, non-tender; bowel sounds normal; no masses,  no organomegaly Extremities: extremities normal, atraumatic, no cyanosis or edema Neurologic: Alert and oriented X 3, normal strength and tone. Normal symmetric reflexes. Normal coordination and gait  ECOG PERFORMANCE STATUS: 1 - Symptomatic but completely ambulatory  Blood pressure 126/69, pulse 83,  temperature 97.1 F (36.2 C), temperature source Oral, resp. rate 17, height 5\' 6"  (1.676 m), weight 124 lb 6.4 oz (56.427 kg).  LABORATORY DATA: Lab Results  Component Value Date   WBC 2.8* 09/21/2012   HGB 12.3* 09/21/2012   HCT 36.4* 09/21/2012   MCV 91.7 09/21/2012   PLT 161 09/21/2012      Chemistry      Component Value Date/Time   NA 139 09/13/2012 0811   NA 135 07/23/2012 1445   K 4.3 09/13/2012 0811   K 4.0 07/23/2012 1445   CL 104 09/13/2012 0811   CL 99 07/23/2012 1445   CO2 25 09/13/2012 0811   CO2 25 07/23/2012 1445   BUN 25.1 09/13/2012 0811   BUN 20 07/23/2012 1445   CREATININE 1.0 09/13/2012 0811   CREATININE 1.00 07/23/2012 1445      Component Value Date/Time   CALCIUM 9.8 09/13/2012 0811   CALCIUM 9.7 07/23/2012 1445   ALKPHOS 108 09/13/2012 0811   AST 17 09/13/2012 0811   ALT 25 09/13/2012 0811   BILITOT 0.40 09/13/2012 0811       RADIOGRAPHIC STUDIES: No results found.  ASSESSMENT: this is a very pleasant 72 years old white male with metastatic non-small cell lung cancer, adenocarcinoma currently undergoing systemic chemotherapy with carboplatin and Alimta status post 1 cycle.   PLAN: the patient is tolerating his treatment fairly well with no significant adverse effects except for mild fatigue. He is also tolerating his palliative radiotherapy to the left upper lobe lung mass fairly well. I recommended for the patient to continue his treatment with carboplatin and Alimta as scheduled. He would come back for follow up visit in 2 weeks with the next cycle of his chemotherapy. He was advised to call immediately if he has any concerning symptoms in the interval.  All questions were answered. The patient knows to call the clinic with any problems, questions or concerns. We can certainly see the patient much sooner if necessary.  I spent 15 minutes counseling the patient face to face. The total time spent in the appointment was 25 minutes.

## 2012-09-22 ENCOUNTER — Ambulatory Visit
Admission: RE | Admit: 2012-09-22 | Discharge: 2012-09-22 | Disposition: A | Discharge status: Home / Self Care | Payer: Medicare Other | Source: Ambulatory Visit | Attending: Radiation Oncology | Admitting: Radiation Oncology

## 2012-09-23 ENCOUNTER — Ambulatory Visit
Admission: RE | Admit: 2012-09-23 | Discharge: 2012-09-23 | Disposition: A | Discharge status: Home / Self Care | Payer: Medicare Other | Source: Ambulatory Visit | Attending: Radiation Oncology | Admitting: Radiation Oncology

## 2012-09-24 ENCOUNTER — Telehealth: Payer: Self-pay | Admitting: Internal Medicine

## 2012-09-24 ENCOUNTER — Ambulatory Visit
Admission: RE | Admit: 2012-09-24 | Discharge: 2012-09-24 | Disposition: A | Discharge status: Home / Self Care | Payer: Medicare Other | Source: Ambulatory Visit | Attending: Radiation Oncology | Admitting: Radiation Oncology

## 2012-09-24 ENCOUNTER — Encounter: Payer: Self-pay | Admitting: Radiation Oncology

## 2012-09-24 NOTE — Telephone Encounter (Signed)
s.w. pt and advised on may appts....pt ok and aware...pt comming to pick up new sched

## 2012-09-24 NOTE — Progress Notes (Signed)
  Radiation Oncology         [phone] ________________________________  Name: Luis Payne MRN: [id number]  Date: 09/24/2012  DOB: 1940-08-10  Weekly Radiation Therapy Management  Current Dose: 32.4 Gy     Planned Dose:  70.2 Gy  Narrative . . . . . . . . The patient presents for routine under treatment assessment.                                                     The patient is without complaint.                                 Set-up films were reviewed.                                 The chart was checked. Physical Findings. . .  weight is 125 lb 12.8 oz (57.063 kg). His blood pressure is 120/56 and his pulse is 82. His respiration is 15 and oxygen saturation is 100%. . Weight essentially stable.  No significant changes. Impression . . . . . . . The patient is  tolerating radiation. Plan . . . . . . . . . . . . Continue treatment as planned.  ________________________________  [name]. Kathrynn Running, M.D.

## 2012-09-24 NOTE — Progress Notes (Signed)
Patient presents to the clinic today unaccompanied for PUT with Dr. Kathrynn Running. Patient is alert and oriented to person, place, and time. No distress noted. Steady gait noted. Pleasant affect noted. Patient denies pain at this time. Patient reports a dry cough. Patient reports occasional shortness of breath. Patient reports painful and difficulty swallowing has began over the last two days. Patient denies skin changes within treatment area. Patient reports mild fatigue. Reported all findings to Dr. Kathrynn Running.

## 2012-09-27 ENCOUNTER — Encounter: Payer: Self-pay | Admitting: Radiation Oncology

## 2012-09-27 ENCOUNTER — Ambulatory Visit
Admission: RE | Admit: 2012-09-27 | Discharge: 2012-09-27 | Disposition: A | Discharge status: Home / Self Care | Payer: Medicare Other | Source: Ambulatory Visit | Attending: Radiation Oncology | Admitting: Radiation Oncology

## 2012-09-27 DIAGNOSIS — C7949 Secondary malignant neoplasm of other parts of nervous system: Secondary | ICD-10-CM

## 2012-09-27 MED ORDER — FLUOXETINE HCL 20 MG PO CAPS: 20.0000 mg | ORAL_CAPSULE | Freq: Every day | ORAL | Status: DC

## 2012-09-27 NOTE — Progress Notes (Signed)
Pt denies HA, pain, nausea, blurred vision, unsteady gait, dizziness, loss of appetite. He does report fatigue. Pt states he has not smoked in past week. Pt is not on steroid at this time.

## 2012-09-27 NOTE — Progress Notes (Signed)
  Radiation Oncology         [phone] ________________________________  Name: Luis Payne MRN: [id number]  Date: 09/27/2012  DOB: 1940/11/20  Follow-Up Visit Note  CC: Cassell Smiles., MD  Elfredia Nevins, MD  Diagnosis:   72 yo man with 3 subcentimeter brain metastases from newly diagnosed T3 N0 adenocarcinoma of the left apical lung s/p SRS to the brain on 08/26/12  Interval Since Last Radiation:  1  months  Narrative:  The patient returns today for routine follow-up.  He has no complaints related to the brain.                              ALLERGIES:  has No Known Allergies.  Meds: Current Outpatient Prescriptions  Medication Sig Dispense Refill  . Ascorbic Acid (VITAMIN C) 500 MG CAPS Take 1 capsule by mouth every morning.      . cholecalciferol (VITAMIN D) 1000 UNITS tablet Take 1,000 Units by mouth every morning.      Marland Kitchen dexamethasone (DECADRON) 4 MG tablet Milligram by mouth twice a day the day before, day of and day after the chemotherapy every 3 weeks.  30 tablet  1  . diphenhydrAMINE (BENADRYL) 25 MG tablet Take 50 mg by mouth at bedtime.      . folic acid (FOLVITE) 1 MG tablet Take 1 tablet (1 mg total) by mouth daily.  30 tablet  4  . hyaluronate sodium (RADIAPLEXRX) GEL Apply 1 application topically 2 (two) times daily. Apply after rad txs daily one skin becomes irritated,itching or red      . loratadine (CLARITIN) 10 MG tablet Take 10 mg by mouth daily.      . Multiple Vitamin (MULTIVITAMIN WITH MINERALS) TABS Take 1 tablet by mouth every morning.      Marland Kitchen oxyCODONE-acetaminophen (PERCOCET/ROXICET) 5-325 MG per tablet Take 1 tablet by mouth every 6 (six) hours as needed for pain.  30 tablet  0  . prochlorperazine (COMPAZINE) 10 MG tablet Take 1 tablet (10 mg total) by mouth every 6 (six) hours as needed.  60 tablet  0  . FLUoxetine (PROZAC) 20 MG capsule Take 1 capsule (20 mg total) by mouth daily.  30 capsule  3   No current facility-administered medications for this  encounter.    Physical Findings: The patient is in no acute distress. Patient is alert and oriented.  weight is 125 lb 11.2 oz (57.017 kg). His oral temperature is 98.3 F (36.8 C). His blood pressure is 116/60 and his pulse is 82. His respiration is 20 and oxygen saturation is 100%. .  No significant changes.  Lab Findings: Lab Results  Component Value Date   WBC 2.8* 09/21/2012   HGB 12.3* 09/21/2012   HCT 36.4* 09/21/2012   MCV 91.7 09/21/2012   PLT 161 09/21/2012   Radiographic Findings: No results found.  Impression:  The patient is recovering from the effects of radiation.  He does have some depressive symptoms  Plan:  Brain MRI in 2 months, then follow-up.  Given Prozac 20 mg daily.  _____________________________________  [name] Kathrynn Running, M.D.

## 2012-09-28 ENCOUNTER — Ambulatory Visit
Admission: RE | Admit: 2012-09-28 | Discharge: 2012-09-28 | Disposition: A | Discharge status: Home / Self Care | Payer: Medicare Other | Source: Ambulatory Visit | Attending: Radiation Oncology | Admitting: Radiation Oncology

## 2012-09-28 ENCOUNTER — Other Ambulatory Visit (HOSPITAL_BASED_OUTPATIENT_CLINIC_OR_DEPARTMENT_OTHER): Payer: Medicare Other | Admitting: Lab

## 2012-09-28 DIAGNOSIS — C3410 Malignant neoplasm of upper lobe, unspecified bronchus or lung: Secondary | ICD-10-CM

## 2012-09-28 LAB — COMPREHENSIVE METABOLIC PANEL (CC13)
Alkaline Phosphatase: 114 U/L (ref 40–150)
BUN: 17.3 mg/dL (ref 7.0–26.0)
CO2: 31 mEq/L — ABNORMAL HIGH (ref 22–29)
Creatinine: 1 mg/dL (ref 0.7–1.3)
Glucose: 91 mg/dl (ref 70–99)
Total Bilirubin: 0.29 mg/dL (ref 0.20–1.20)
Total Protein: 6.9 g/dL (ref 6.4–8.3)

## 2012-09-28 LAB — CBC WITH DIFFERENTIAL/PLATELET
Basophils Absolute: 0 10*3/uL (ref 0.0–0.1)
Eosinophils Absolute: 0 10*3/uL (ref 0.0–0.5)
HCT: 33.4 % — ABNORMAL LOW (ref 38.4–49.9)
LYMPH%: 17.9 % (ref 14.0–49.0)
MCV: 90.6 fL (ref 79.3–98.0)
MONO#: 0.5 10*3/uL (ref 0.1–0.9)
MONO%: 19.4 % — ABNORMAL HIGH (ref 0.0–14.0)
NEUT#: 1.6 10*3/uL (ref 1.5–6.5)
NEUT%: 61.6 % (ref 39.0–75.0)
Platelets: 230 10*3/uL (ref 140–400)
RBC: 3.69 10*6/uL — ABNORMAL LOW (ref 4.20–5.82)
WBC: 2.5 10*3/uL — ABNORMAL LOW (ref 4.0–10.3)

## 2012-09-29 ENCOUNTER — Ambulatory Visit
Admission: RE | Admit: 2012-09-29 | Discharge: 2012-09-29 | Disposition: A | Discharge status: Home / Self Care | Payer: Medicare Other | Source: Ambulatory Visit | Attending: Radiation Oncology | Admitting: Radiation Oncology

## 2012-09-30 ENCOUNTER — Ambulatory Visit
Admission: RE | Admit: 2012-09-30 | Discharge: 2012-09-30 | Disposition: A | Discharge status: Home / Self Care | Payer: Medicare Other | Source: Ambulatory Visit | Attending: Radiation Oncology | Admitting: Radiation Oncology

## 2012-10-01 ENCOUNTER — Ambulatory Visit
Admission: RE | Admit: 2012-10-01 | Discharge: 2012-10-01 | Disposition: A | Discharge status: Home / Self Care | Payer: Medicare Other | Source: Ambulatory Visit | Attending: Radiation Oncology | Admitting: Radiation Oncology

## 2012-10-01 ENCOUNTER — Encounter: Payer: Self-pay | Admitting: Radiation Oncology

## 2012-10-01 VITALS — BP 124/63 | HR 86 | Resp 16 | Wt 128.0 lb

## 2012-10-01 DIAGNOSIS — C7949 Secondary malignant neoplasm of other parts of nervous system: Secondary | ICD-10-CM

## 2012-10-01 NOTE — Progress Notes (Signed)
Patient presents to the clinic today unaccompanied for PUT with Dr. Kathrynn Running. Patient is alert and oriented to person, place, and time. No distress noted. Steady gait noted. Pleasant affect noted. Patient denies pain at this time. Patient reports a dry cough. Patient reports occasional shortness of breath. Patient reports painful and difficulty swallowing is better. Patient denies skin changes within treatment area. Patient reports mild fatigue. Reported all findings to Dr. Kathrynn Running.

## 2012-10-01 NOTE — Progress Notes (Signed)
  Radiation Oncology         [phone] ________________________________  Name: Luis Payne MRN: [id number]  Date: 10/01/2012  DOB: 1941-04-11  Weekly Radiation Therapy Management  Current Dose: Reviewed/Approved  Narrative . . . . . . . . The patient presents for routine under treatment assessment.                                  The patient denies pain at this time. Patient reports a dry cough. Patient reports occasional shortness of breath. Patient reports painful and difficulty swallowing has began over the last two days. Patient denies skin changes within treatment area. Patient reports mild fatigue.Marland Kitchen  He denies any esophageal irritation.                                 Set-up films were reviewed.                                 The chart was checked. Physical Findings. . Patient is alert and oriented to person, place, and time. No distress noted. Steady gait noted. Pleasant affect noted. Marland Kitchen  weight is 128 lb (58.06 kg). His blood pressure is 124/63 and his pulse is 86. His respiration is 16 and oxygen saturation is 99%. . Weight essentially stable.  No significant changes. Impression . . . . . . . The patient is tolerating radiation. Plan . . . . . . . . . . . . Continue treatment as planned.  ________________________________  [name]. Kathrynn Running, M.D.

## 2012-10-05 ENCOUNTER — Telehealth: Payer: Self-pay | Admitting: Internal Medicine

## 2012-10-05 ENCOUNTER — Encounter: Payer: Self-pay | Admitting: *Deleted

## 2012-10-05 ENCOUNTER — Ambulatory Visit (HOSPITAL_BASED_OUTPATIENT_CLINIC_OR_DEPARTMENT_OTHER): Payer: Medicare Other | Admitting: Internal Medicine

## 2012-10-05 ENCOUNTER — Ambulatory Visit
Admission: RE | Admit: 2012-10-05 | Discharge: 2012-10-05 | Disposition: A | Discharge status: Home / Self Care | Payer: Medicare Other | Source: Ambulatory Visit | Attending: Radiation Oncology | Admitting: Radiation Oncology

## 2012-10-05 ENCOUNTER — Ambulatory Visit (HOSPITAL_BASED_OUTPATIENT_CLINIC_OR_DEPARTMENT_OTHER): Payer: Medicare Other

## 2012-10-05 ENCOUNTER — Other Ambulatory Visit (HOSPITAL_BASED_OUTPATIENT_CLINIC_OR_DEPARTMENT_OTHER): Payer: Medicare Other | Admitting: Lab

## 2012-10-05 ENCOUNTER — Encounter: Payer: Self-pay | Admitting: Internal Medicine

## 2012-10-05 ENCOUNTER — Other Ambulatory Visit: Payer: Medicare Other | Admitting: Lab

## 2012-10-05 ENCOUNTER — Telehealth: Payer: Self-pay | Admitting: *Deleted

## 2012-10-05 DIAGNOSIS — Z5111 Encounter for antineoplastic chemotherapy: Secondary | ICD-10-CM

## 2012-10-05 DIAGNOSIS — C3410 Malignant neoplasm of upper lobe, unspecified bronchus or lung: Secondary | ICD-10-CM

## 2012-10-05 LAB — CBC WITH DIFFERENTIAL/PLATELET
Basophils Absolute: 0 10*3/uL (ref 0.0–0.1)
Eosinophils Absolute: 0 10*3/uL (ref 0.0–0.5)
HGB: 11.7 g/dL — ABNORMAL LOW (ref 13.0–17.1)
LYMPH%: 2.6 % — ABNORMAL LOW (ref 14.0–49.0)
MCV: 89.3 fL (ref 79.3–98.0)
MONO#: 0.8 10*3/uL (ref 0.1–0.9)
MONO%: 6 % (ref 0.0–14.0)
NEUT#: 12.2 10*3/uL — ABNORMAL HIGH (ref 1.5–6.5)
Platelets: 322 10*3/uL (ref 140–400)
RBC: 3.74 10*6/uL — ABNORMAL LOW (ref 4.20–5.82)
WBC: 13.3 10*3/uL — ABNORMAL HIGH (ref 4.0–10.3)
nRBC: 0 % (ref 0–0)

## 2012-10-05 LAB — COMPREHENSIVE METABOLIC PANEL (CC13)
ALT: 21 U/L (ref 0–55)
CO2: 27 mEq/L (ref 22–29)
Calcium: 10.6 mg/dL — ABNORMAL HIGH (ref 8.4–10.4)
Chloride: 101 mEq/L (ref 98–107)
Creatinine: 1.1 mg/dL (ref 0.7–1.3)
Glucose: 132 mg/dl — ABNORMAL HIGH (ref 70–99)
Sodium: 139 mEq/L (ref 136–145)
Total Protein: 8.3 g/dL (ref 6.4–8.3)

## 2012-10-05 MED ORDER — DEXAMETHASONE SODIUM PHOSPHATE 20 MG/5ML IJ SOLN
20.0000 mg | Freq: Once | INTRAMUSCULAR | Status: AC
  Administered 2012-10-05: 20 mg via INTRAVENOUS

## 2012-10-05 MED ORDER — SODIUM CHLORIDE 0.9 % IV SOLN
Freq: Once | INTRAVENOUS | Status: AC
  Administered 2012-10-05: 12:00:00 via INTRAVENOUS

## 2012-10-05 MED ORDER — CARBOPLATIN CHEMO INJECTION 450 MG/45ML
387.0000 mg | Freq: Once | INTRAVENOUS | Status: AC
  Administered 2012-10-05: 390 mg via INTRAVENOUS
  Filled 2012-10-05: qty 39

## 2012-10-05 MED ORDER — SODIUM CHLORIDE 0.9 % IV SOLN
500.0000 mg/m2 | Freq: Once | INTRAVENOUS | Status: AC
  Administered 2012-10-05: 800 mg via INTRAVENOUS
  Filled 2012-10-05: qty 32

## 2012-10-05 MED ORDER — ONDANSETRON 16 MG/50ML IVPB (CHCC)
16.0000 mg | Freq: Once | INTRAVENOUS | Status: AC
  Administered 2012-10-05: 16 mg via INTRAVENOUS

## 2012-10-05 NOTE — Telephone Encounter (Signed)
Per staff message and POF I have scheduled appts.  JMW  

## 2012-10-05 NOTE — Patient Instructions (Addendum)
[facility] Discharge Instructions for Patients Receiving Chemotherapy  Today you received the following chemotherapy agents: Alimta and Carboplatin. To help prevent nausea and vomiting after your treatment, we encourage you to take your nausea medication, Compazine. Take it every six hours as needed for nausea.   If you develop nausea and vomiting that is not controlled by your nausea medication, call the clinic. If it is after clinic hours your family physician or the after hours number for the clinic or go to the Emergency Department.   BELOW ARE SYMPTOMS THAT SHOULD BE REPORTED IMMEDIATELY:  *FEVER GREATER THAN 100.5 F  *CHILLS WITH OR WITHOUT FEVER  NAUSEA AND VOMITING THAT IS NOT CONTROLLED WITH YOUR NAUSEA MEDICATION  *UNUSUAL SHORTNESS OF BREATH  *UNUSUAL BRUISING OR BLEEDING  TENDERNESS IN MOUTH AND THROAT WITH OR WITHOUT PRESENCE OF ULCERS  *URINARY PROBLEMS  *BOWEL PROBLEMS  UNUSUAL RASH Items with * indicate a potential emergency and should be followed up as soon as possible.  Feel free to call the clinic you have any questions or concerns. The clinic phone number is [phone].   I have been informed and understand all the instructions given to me. I know to contact the clinic, my physician, or go to the Emergency Department if any problems should occur. I do not have any questions at this time, but understand that I may call the clinic during office hours   should I have any questions or need assistance in obtaining follow up care.

## 2012-10-05 NOTE — Telephone Encounter (Signed)
Gave pt appt for lab, Md and chemo for May, and June 2014

## 2012-10-05 NOTE — Patient Instructions (Signed)
Chemotherapy with carboplatin and Alimta as scheduled. Followup visit in 3 weeks with the next cycle of chemotherapy.

## 2012-10-05 NOTE — Progress Notes (Signed)
[facility] Telephone:(336) [phone]   Fax:(336) [phone]  OFFICE PROGRESS NOTE  Cassell Smiles., MD [address] [place] [address]  DIAGNOSIS: Metastatic non-small cell lung cancer, adenocarcinoma with negative EGFR mutation and negative ALK gene translocation diagnosed in April 2014   PRIOR THERAPY: Status post stereotactic radiotherapy to 2 brain lesions under the care of Dr. Kathrynn Running   CURRENT THERAPY:  1) Systemic chemotherapy with carboplatin for AUC of 5 and Alimta 500 mg/M2 every 3 weeks, status post 1 cycle. First dose was given on May, 05, 2014. 2) palliative radiotherapy to the left lung mass.  INTERVAL HISTORY: Luis Payne 72 y.o. male returns to the clinic today for followup visit accompanied his wife. The patient related the first cycle of her systemic chemotherapy fairly well with no significant adverse effects. He is currently undergoing palliative radiotherapy to the left upper lobe lung mass under the care of Dr. Kathrynn Running. He is tolerating his treatment fairly well. The patient denied having any significant nausea or vomiting. He denied having any significant weight loss or night sweats. He has no chest pain, shortness breath, cough or hemoptysis.  MEDICAL HISTORY: Past Medical History  Diagnosis Date  . Substance abuse     TOBACCO  . Lung mass   . 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 Brain metastases 08/18/12    mri-  . HOH (hard of hearing)     wears hearing aids  . COPD (chronic obstructive pulmonary disease)     ALLERGIES:  has No Known Allergies.  MEDICATIONS:  Current Outpatient Prescriptions  Medication Sig Dispense Refill  . Ascorbic Acid (VITAMIN C) 500 MG CAPS Take 1 capsule by mouth every morning.      . cholecalciferol (VITAMIN D) 1000 UNITS tablet Take 1,000 Units by mouth every morning.      Marland Kitchen dexamethasone (DECADRON) 4 MG tablet Milligram by mouth twice a day the day before, day of and day  after the chemotherapy every 3 weeks.  30 tablet  1  . FLUoxetine (PROZAC) 20 MG capsule Take 1 capsule (20 mg total) by mouth daily.  30 capsule  3  . folic acid (FOLVITE) 1 MG tablet Take 1 tablet (1 mg total) by mouth daily.  30 tablet  4  . hyaluronate sodium (RADIAPLEXRX) GEL Apply 1 application topically 2 (two) times daily. Apply after rad txs daily one skin becomes irritated,itching or red      . loratadine (CLARITIN) 10 MG tablet Take 10 mg by mouth daily.      . Multiple Vitamin (MULTIVITAMIN WITH MINERALS) TABS Take 1 tablet by mouth every morning.      . prochlorperazine (COMPAZINE) 10 MG tablet Take 1 tablet (10 mg total) by mouth every 6 (six) hours as needed.  60 tablet  0  . diphenhydrAMINE (BENADRYL) 25 MG tablet Take 50 mg by mouth at bedtime.      Marland Kitchen oxyCODONE-acetaminophen (PERCOCET/ROXICET) 5-325 MG per tablet Take 1 tablet by mouth every 6 (six) hours as needed for pain.  30 tablet  0   No current facility-administered medications for this visit.    SURGICAL HISTORY:  Past Surgical History  Procedure Laterality Date  . Wrist surgery    . Lung biopsy Left 08/17/12    LUL lung mass-adenocarcinoma    REVIEW OF SYSTEMS:  A comprehensive review of systems was negative except for: Constitutional: positive for fatigue   PHYSICAL EXAMINATION: General  appearance: alert, cooperative and no distress Head: Normocephalic, without obvious abnormality, atraumatic Neck: no adenopathy Lymph nodes: Cervical, supraclavicular, and axillary nodes normal. Resp: clear to auscultation bilaterally Cardio: regular rate and rhythm, S1, S2 normal, no murmur, click, rub or gallop GI: soft, non-tender; bowel sounds normal; no masses,  no organomegaly Extremities: extremities normal, atraumatic, no cyanosis or edema Neurologic: Alert and oriented X 3, normal strength and tone. Normal symmetric reflexes. Normal coordination and gait  ECOG PERFORMANCE STATUS: 1 - Symptomatic but completely  ambulatory  Blood pressure 124/71, pulse 85, temperature 97.1 F (36.2 C), temperature source Oral, resp. rate 20, height 5\' 6"  (1.676 m), weight 124 lb 14.4 oz (56.654 kg).  LABORATORY DATA: Lab Results  Component Value Date   WBC 13.3* 10/05/2012   HGB 11.7* 10/05/2012   HCT 33.4* 10/05/2012   MCV 89.3 10/05/2012   PLT 322 10/05/2012      Chemistry      Component Value Date/Time   NA 141 09/28/2012 0936   NA 135 07/23/2012 1445   K 4.7 09/28/2012 0936   K 4.0 07/23/2012 1445   CL 103 09/28/2012 0936   CL 99 07/23/2012 1445   CO2 31* 09/28/2012 0936   CO2 25 07/23/2012 1445   BUN 17.3 09/28/2012 0936   BUN 20 07/23/2012 1445   CREATININE 1.0 09/28/2012 0936   CREATININE 1.00 07/23/2012 1445      Component Value Date/Time   CALCIUM 9.7 09/28/2012 0936   CALCIUM 9.7 07/23/2012 1445   ALKPHOS 114 09/28/2012 0936   AST 17 09/28/2012 0936   ALT 21 09/28/2012 0936   BILITOT 0.29 09/28/2012 0936       RADIOGRAPHIC STUDIES: No results found.  ASSESSMENT: This is a very pleasant 72 years old white male with metastatic non-small cell lung cancer, adenocarcinoma currently undergoing systemic chemotherapy with carboplatin and Alimta status post 1 cycle.   PLAN: The patient is tolerating his treatment fairly well with no significant adverse effects. I recommended for him to continue his current treatment was carboplatin and Alimta as scheduled. I would see the patient back for followup visit in 3 weeks with the next cycle of his chemotherapy. He was advised to call immediately if he has any concerning symptoms in the interval.  All questions were answered. The patient knows to call the clinic with any problems, questions or concerns. We can certainly see the patient much sooner if necessary.

## 2012-10-06 ENCOUNTER — Ambulatory Visit
Admission: RE | Admit: 2012-10-06 | Discharge: 2012-10-06 | Disposition: A | Discharge status: Home / Self Care | Payer: Medicare Other | Source: Ambulatory Visit | Attending: Radiation Oncology | Admitting: Radiation Oncology

## 2012-10-07 ENCOUNTER — Ambulatory Visit
Admission: RE | Admit: 2012-10-07 | Discharge: 2012-10-07 | Disposition: A | Discharge status: Home / Self Care | Payer: Medicare Other | Source: Ambulatory Visit | Attending: Radiation Oncology | Admitting: Radiation Oncology

## 2012-10-07 ENCOUNTER — Telehealth: Payer: Self-pay | Admitting: *Deleted

## 2012-10-07 ENCOUNTER — Other Ambulatory Visit: Payer: Self-pay | Admitting: Radiation Therapy

## 2012-10-07 DIAGNOSIS — C7949 Secondary malignant neoplasm of other parts of nervous system: Secondary | ICD-10-CM

## 2012-10-07 NOTE — Telephone Encounter (Signed)
Called  [facility] at [phone], spoke with Pharmacist, RX thorazine 25mg   1 tab oral 3x day as needed, dispense #25, no refills, called patient wife and gave information to her patient had tried sugar,only helped 5 minutes on hiccups 3:32 PM

## 2012-10-07 NOTE — Telephone Encounter (Signed)
Wife called, Luis Payne is having hiccups that started yesterday, and and is getting worse, tried teaspoon sugar,didn't help, had Chemo 10/05/12, on Deacdron per protocol, states hard to catch his breath with constant hiccups, will speak with MD and call patient 3:16 PM  back

## 2012-10-08 ENCOUNTER — Ambulatory Visit
Admission: RE | Admit: 2012-10-08 | Discharge: 2012-10-08 | Disposition: A | Discharge status: Home / Self Care | Payer: Medicare Other | Source: Ambulatory Visit | Attending: Radiation Oncology | Admitting: Radiation Oncology

## 2012-10-08 ENCOUNTER — Encounter: Payer: Self-pay | Admitting: Radiation Oncology

## 2012-10-08 NOTE — Progress Notes (Signed)
  Radiation Oncology         [phone] ________________________________  Name: Luis Payne MRN: [id number]  Date: 10/08/2012  DOB: [birth year]  Weekly Radiation Therapy Management  Current Dose: 56.7 Gy     Planned Dose:  70.2 Gy  Narrative . . . . . . . . The patient presents for routine under treatment assessment.                                                  Patient reports a cough that is mostly dry but, occasionally productive with clear sputum. Patient denies hemoptysis. Patient reports a sore throat. Patient reports night sweats x1 week. Patient reports that the thorazine has helped to resolve the hiccups. Only mild hyperpigmentation without desquamation of anterior chest noted. Patient reports using radiaplex as directed. Patient reports fatigue                                 Set-up films were reviewed.                                 The chart was checked. Physical Findings. . .  weight is 129 lb 11.2 oz (58.832 kg). His respiration is 18. . Weight essentially stable.  No significant changes. Impression . . . . . . . The patient is tolerating radiation. Plan . . . . . . . . . . . . Continue treatment as planned.  ________________________________  [name]. Kathrynn Running, M.D.

## 2012-10-08 NOTE — Progress Notes (Signed)
Patient reports a cough that is mostly dry but, occasionally productive with clear sputum. Patient denies hemoptysis. Patient reports a sore throat. Patient reports night sweats x1 week. Patient reports that the thorazine has helped to resolve the hiccups. Only mild hyperpigmentation without desquamation of anterior chest noted. Patient reports using radiaplex as directed. Patient reports fatigue.

## 2012-10-11 ENCOUNTER — Other Ambulatory Visit (HOSPITAL_BASED_OUTPATIENT_CLINIC_OR_DEPARTMENT_OTHER): Payer: Medicare Other | Admitting: Lab

## 2012-10-11 ENCOUNTER — Encounter: Payer: Medicare Other | Admitting: *Deleted

## 2012-10-11 ENCOUNTER — Ambulatory Visit
Admission: RE | Admit: 2012-10-11 | Discharge: 2012-10-11 | Disposition: A | Discharge status: Home / Self Care | Payer: Medicare Other | Source: Ambulatory Visit | Attending: Radiation Oncology | Admitting: Radiation Oncology

## 2012-10-11 DIAGNOSIS — C3410 Malignant neoplasm of upper lobe, unspecified bronchus or lung: Secondary | ICD-10-CM

## 2012-10-11 DIAGNOSIS — C7931 Secondary malignant neoplasm of brain: Secondary | ICD-10-CM

## 2012-10-11 LAB — COMPREHENSIVE METABOLIC PANEL (CC13)
ALT: 33 U/L (ref 0–55)
AST: 21 U/L (ref 5–34)
Albumin: 2.5 g/dL — ABNORMAL LOW (ref 3.5–5.0)
CO2: 26 mEq/L (ref 22–29)
Calcium: 9.4 mg/dL (ref 8.4–10.4)
Chloride: 104 mEq/L (ref 98–107)
Creatinine: 1 mg/dL (ref 0.7–1.3)
Potassium: 4 mEq/L (ref 3.5–5.1)
Sodium: 138 mEq/L (ref 136–145)
Total Protein: 6.9 g/dL (ref 6.4–8.3)

## 2012-10-11 LAB — CBC WITH DIFFERENTIAL/PLATELET
BASO%: 0.1 % (ref 0.0–2.0)
Basophils Absolute: 0 10*3/uL (ref 0.0–0.1)
EOS%: 0.1 % (ref 0.0–7.0)
Eosinophils Absolute: 0 10*3/uL (ref 0.0–0.5)
HCT: 29.9 % — ABNORMAL LOW (ref 38.4–49.9)
HGB: 10.5 g/dL — ABNORMAL LOW (ref 13.0–17.1)
LYMPH%: 7.1 % — ABNORMAL LOW (ref 14.0–49.0)
MCH: 32 pg (ref 27.2–33.4)
MCHC: 35.2 g/dL (ref 32.0–36.0)
MCV: 90.9 fL (ref 79.3–98.0)
MONO#: 0 10*3/uL — ABNORMAL LOW (ref 0.1–0.9)
MONO%: 1.7 % (ref 0.0–14.0)
NEUT#: 1.9 10*3/uL (ref 1.5–6.5)
NEUT%: 91 % — ABNORMAL HIGH (ref 39.0–75.0)
Platelets: 199 10*3/uL (ref 140–400)
RBC: 3.29 10*6/uL — ABNORMAL LOW (ref 4.20–5.82)
RDW: 14.5 % (ref 11.0–14.6)
WBC: 2.1 10*3/uL — ABNORMAL LOW (ref 4.0–10.3)
lymph#: 0.1 10*3/uL — ABNORMAL LOW (ref 0.9–3.3)

## 2012-10-11 NOTE — Progress Notes (Signed)
10/11/2012 See Consent Form Encounter for notes related to informed consent. Following completion of informed consent process today, patient proceeded to complete the Baseline Patient reported outcomes (PROs), including the EQ-[id number] and lung cancer symptom scale (LCSS). Patient is aware that baseline blood sample collection will occur at his next scheduled lab draw on June 9th, 2014, and that no additional visits will be required for collection of the blood samples. Patient is aware that tumor tissue will be requested from the pathology department, if a sufficient sample exists. Thanked patient for his participation in the research study. Cindy S. Clelia Croft BSN, RN, [facility] 10/11/2012 12:38 PM

## 2012-10-12 ENCOUNTER — Encounter: Payer: Self-pay | Admitting: *Deleted

## 2012-10-12 ENCOUNTER — Other Ambulatory Visit: Payer: Medicare Other | Admitting: Lab

## 2012-10-12 ENCOUNTER — Ambulatory Visit
Admission: RE | Admit: 2012-10-12 | Discharge: 2012-10-12 | Disposition: A | Discharge status: Home / Self Care | Payer: Medicare Other | Source: Ambulatory Visit | Attending: Radiation Oncology | Admitting: Radiation Oncology

## 2012-10-13 ENCOUNTER — Encounter: Payer: Self-pay | Admitting: Radiation Oncology

## 2012-10-13 ENCOUNTER — Ambulatory Visit
Admission: RE | Admit: 2012-10-13 | Discharge: 2012-10-13 | Disposition: A | Discharge status: Home / Self Care | Payer: Medicare Other | Source: Ambulatory Visit | Attending: Radiation Oncology | Admitting: Radiation Oncology

## 2012-10-13 VITALS — BP 134/76 | HR 111 | Resp 18 | Wt 122.7 lb

## 2012-10-13 DIAGNOSIS — R918 Other nonspecific abnormal finding of lung field: Secondary | ICD-10-CM

## 2012-10-13 MED ORDER — CHLORPROMAZINE HCL 25 MG PO TABS: 25.0000 mg | ORAL_TABLET | Freq: Three times a day (TID) | ORAL | Status: DC | PRN

## 2012-10-13 MED ORDER — SUCRALFATE 1 G PO TABS: 1.0000 g | ORAL_TABLET | Freq: Four times a day (QID) | ORAL | Status: DC

## 2012-10-13 NOTE — Progress Notes (Addendum)
Chemo one week ago. Reports great fatigue today. Hiccups continue. Encouraged patient to take thorazine as directed but, around the clock instead of as needed. Also, strongly encourage patient to take prilosec daily as directed. Verbalized understanding. Denies nausea or vomiting. Denies headache, dizziness or ringing in the ears. Reports diplopia began yesterday along with staggering gait. Hyperpigmentation of left upper chest noted without desquamation but, no skin changes noted left upper back. Reports using radiaplex bid as directed. Non productive cough noted. Reports shortness of breath is worse. Down seven pounds since may 30th. Reports pain and difficulty associated with swallowing. Reviewed calendar and appointments with patient and his wife.

## 2012-10-13 NOTE — Progress Notes (Signed)
  Radiation Oncology         [phone] ________________________________  Name: Luis Payne MRN: [id number]  Date: 10/13/2012  DOB: Nov 03, 1940  Weekly Radiation Therapy Management  Current Dose: 64.8 Gy     Planned Dose:  70.2 Gy  Narrative . . . . . . . . The patient presents for routine under treatment assessment.                                  The patient is without complaint.                                 Set-up films were reviewed.                                 The chart was checked. Physical Findings. . .  weight is 122 lb 11.2 oz (55.656 kg). His blood pressure is 134/76 and his pulse is 111. His respiration is 18. . Weight essentially stable.  No significant changes. Impression . . . . . . . The patient is tolerating radiation. Plan . . . . . . . . . . . . Continue treatment as planned.  Given Carafate and Thorazine refill.  ________________________________  [name]. Kathrynn Running, M.D.

## 2012-10-14 ENCOUNTER — Ambulatory Visit
Admission: RE | Admit: 2012-10-14 | Discharge: 2012-10-14 | Disposition: A | Discharge status: Home / Self Care | Payer: Medicare Other | Source: Ambulatory Visit | Attending: Radiation Oncology | Admitting: Radiation Oncology

## 2012-10-15 ENCOUNTER — Encounter: Payer: Self-pay | Admitting: Radiation Oncology

## 2012-10-15 ENCOUNTER — Ambulatory Visit
Admission: RE | Admit: 2012-10-15 | Discharge: 2012-10-15 | Disposition: A | Discharge status: Home / Self Care | Payer: Medicare Other | Source: Ambulatory Visit | Attending: Radiation Oncology | Admitting: Radiation Oncology

## 2012-10-15 NOTE — Progress Notes (Signed)
  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  He was treated with 4 radiation fields including an anterior beam, two separate left posterior obliques and a dynamic conformal arc from zero to 180 degrees.  All fields conformally covered the tumor with maximal avoidance of uninvolved lung, heart, spinal cord and esophagus.  Narrative: The patient tolerated radiation treatment relatively well.   He had some esophageal symptoms.  Plan: The patient has completed radiation treatment. The patient will return to radiation oncology clinic for routine followup in one month. I advised the patient and his wife to call or return sooner if they have any questions or concerns related to their recovery or treatment. ________________________________  [name]. Kathrynn Running, M.D.

## 2012-10-17 ENCOUNTER — Encounter (HOSPITAL_COMMUNITY): Payer: Self-pay | Admitting: *Deleted

## 2012-10-17 ENCOUNTER — Telehealth: Payer: Self-pay | Admitting: Oncology

## 2012-10-17 ENCOUNTER — Emergency Department (HOSPITAL_COMMUNITY): Payer: Medicare Other

## 2012-10-17 ENCOUNTER — Inpatient Hospital Stay (HOSPITAL_COMMUNITY)
Admission: EM | Admit: 2012-10-17 | Discharge: 2012-10-22 | DRG: 871 | Disposition: A | Discharge status: Home Health | Payer: Medicare Other | Attending: Internal Medicine | Admitting: Internal Medicine

## 2012-10-17 DIAGNOSIS — Z79899 Other long term (current) drug therapy: Secondary | ICD-10-CM

## 2012-10-17 DIAGNOSIS — J189 Pneumonia, unspecified organism: Secondary | ICD-10-CM | POA: Diagnosis present

## 2012-10-17 DIAGNOSIS — R918 Other nonspecific abnormal finding of lung field: Secondary | ICD-10-CM

## 2012-10-17 DIAGNOSIS — C7931 Secondary malignant neoplasm of brain: Secondary | ICD-10-CM

## 2012-10-17 DIAGNOSIS — J9601 Acute respiratory failure with hypoxia: Secondary | ICD-10-CM

## 2012-10-17 DIAGNOSIS — A481 Legionnaires' disease: Secondary | ICD-10-CM

## 2012-10-17 DIAGNOSIS — R5381 Other malaise: Secondary | ICD-10-CM

## 2012-10-17 DIAGNOSIS — J4489 Other specified chronic obstructive pulmonary disease: Secondary | ICD-10-CM | POA: Diagnosis present

## 2012-10-17 DIAGNOSIS — E878 Other disorders of electrolyte and fluid balance, not elsewhere classified: Secondary | ICD-10-CM

## 2012-10-17 DIAGNOSIS — Z87891 Personal history of nicotine dependence: Secondary | ICD-10-CM

## 2012-10-17 DIAGNOSIS — E872 Acidosis, unspecified: Secondary | ICD-10-CM | POA: Diagnosis present

## 2012-10-17 DIAGNOSIS — E860 Dehydration: Secondary | ICD-10-CM

## 2012-10-17 DIAGNOSIS — Z66 Do not resuscitate: Secondary | ICD-10-CM | POA: Diagnosis present

## 2012-10-17 DIAGNOSIS — R21 Rash and other nonspecific skin eruption: Secondary | ICD-10-CM | POA: Diagnosis present

## 2012-10-17 DIAGNOSIS — I498 Other specified cardiac arrhythmias: Secondary | ICD-10-CM | POA: Diagnosis present

## 2012-10-17 DIAGNOSIS — E876 Hypokalemia: Secondary | ICD-10-CM | POA: Diagnosis present

## 2012-10-17 DIAGNOSIS — C7949 Secondary malignant neoplasm of other parts of nervous system: Secondary | ICD-10-CM | POA: Diagnosis present

## 2012-10-17 DIAGNOSIS — Z9221 Personal history of antineoplastic chemotherapy: Secondary | ICD-10-CM

## 2012-10-17 DIAGNOSIS — I5020 Unspecified systolic (congestive) heart failure: Secondary | ICD-10-CM | POA: Diagnosis present

## 2012-10-17 DIAGNOSIS — I509 Heart failure, unspecified: Secondary | ICD-10-CM | POA: Diagnosis present

## 2012-10-17 DIAGNOSIS — D61810 Antineoplastic chemotherapy induced pancytopenia: Secondary | ICD-10-CM

## 2012-10-17 DIAGNOSIS — R627 Adult failure to thrive: Secondary | ICD-10-CM | POA: Diagnosis present

## 2012-10-17 DIAGNOSIS — IMO0002 Reserved for concepts with insufficient information to code with codable children: Secondary | ICD-10-CM

## 2012-10-17 DIAGNOSIS — N179 Acute kidney failure, unspecified: Secondary | ICD-10-CM

## 2012-10-17 DIAGNOSIS — E43 Unspecified severe protein-calorie malnutrition: Secondary | ICD-10-CM | POA: Diagnosis present

## 2012-10-17 DIAGNOSIS — D849 Immunodeficiency, unspecified: Secondary | ICD-10-CM

## 2012-10-17 DIAGNOSIS — D649 Anemia, unspecified: Secondary | ICD-10-CM

## 2012-10-17 DIAGNOSIS — Z923 Personal history of irradiation: Secondary | ICD-10-CM

## 2012-10-17 DIAGNOSIS — C3490 Malignant neoplasm of unspecified part of unspecified bronchus or lung: Secondary | ICD-10-CM | POA: Diagnosis present

## 2012-10-17 DIAGNOSIS — J449 Chronic obstructive pulmonary disease, unspecified: Secondary | ICD-10-CM | POA: Diagnosis present

## 2012-10-17 DIAGNOSIS — R5383 Other fatigue: Secondary | ICD-10-CM

## 2012-10-17 DIAGNOSIS — E871 Hypo-osmolality and hyponatremia: Secondary | ICD-10-CM

## 2012-10-17 DIAGNOSIS — A419 Sepsis, unspecified organism: Principal | ICD-10-CM | POA: Diagnosis present

## 2012-10-17 DIAGNOSIS — F1910 Other psychoactive substance abuse, uncomplicated: Secondary | ICD-10-CM

## 2012-10-17 DIAGNOSIS — Y842 Radiological procedure and radiotherapy as the cause of abnormal reaction of the patient, or of later complication, without mention of misadventure at the time of the procedure: Secondary | ICD-10-CM | POA: Diagnosis present

## 2012-10-17 DIAGNOSIS — T451X5A Adverse effect of antineoplastic and immunosuppressive drugs, initial encounter: Secondary | ICD-10-CM | POA: Diagnosis present

## 2012-10-17 DIAGNOSIS — J9600 Acute respiratory failure, unspecified whether with hypoxia or hypercapnia: Secondary | ICD-10-CM | POA: Diagnosis present

## 2012-10-17 DIAGNOSIS — R531 Weakness: Secondary | ICD-10-CM

## 2012-10-17 DIAGNOSIS — C3410 Malignant neoplasm of upper lobe, unspecified bronchus or lung: Secondary | ICD-10-CM

## 2012-10-17 DIAGNOSIS — D61818 Other pancytopenia: Secondary | ICD-10-CM

## 2012-10-17 LAB — CBC WITH DIFFERENTIAL/PLATELET
Basophils Relative: 0 % (ref 0–1)
Eosinophils Absolute: 0 10*3/uL (ref 0.0–0.7)
HCT: 22.9 % — ABNORMAL LOW (ref 39.0–52.0)
Hemoglobin: 8 g/dL — ABNORMAL LOW (ref 13.0–17.0)
Lymphocytes Relative: 4 % — ABNORMAL LOW (ref 12–46)
MCH: 30.3 pg (ref 26.0–34.0)
MCHC: 34.9 g/dL (ref 30.0–36.0)
Neutro Abs: 2.2 10*3/uL (ref 1.7–7.7)

## 2012-10-17 LAB — URINALYSIS, ROUTINE W REFLEX MICROSCOPIC
Bilirubin Urine: NEGATIVE
Ketones, ur: NEGATIVE mg/dL
Nitrite: NEGATIVE
Protein, ur: 30 mg/dL — AB
Specific Gravity, Urine: 1.021 (ref 1.005–1.030)
Urobilinogen, UA: 1 mg/dL (ref 0.0–1.0)

## 2012-10-17 LAB — COMPREHENSIVE METABOLIC PANEL
Alkaline Phosphatase: 155 U/L — ABNORMAL HIGH (ref 39–117)
BUN: 29 mg/dL — ABNORMAL HIGH (ref 6–23)
GFR calc Af Amer: 58 mL/min — ABNORMAL LOW (ref >90)
GFR calc non Af Amer: 50 mL/min — ABNORMAL LOW (ref >90)
Glucose, Bld: 93 mg/dL (ref 70–99)
Potassium: 3.9 mEq/L (ref 3.5–5.1)
Total Bilirubin: 0.6 mg/dL (ref 0.3–1.2)
Total Protein: 6.8 g/dL (ref 6.0–8.3)

## 2012-10-17 LAB — STREP PNEUMONIAE URINARY ANTIGEN: Strep Pneumo Urinary Antigen: NEGATIVE

## 2012-10-17 LAB — ABO/RH: ABO/RH(D): A POS

## 2012-10-17 LAB — TROPONIN I: Troponin I: <0.3 ng/mL (ref <0.30)

## 2012-10-17 LAB — URINE MICROSCOPIC-ADD ON

## 2012-10-17 MED ORDER — CHLORPROMAZINE HCL 25 MG PO TABS
25.0000 mg | ORAL_TABLET | Freq: Three times a day (TID) | ORAL | Status: DC | PRN
  Filled 2012-10-17: qty 1

## 2012-10-17 MED ORDER — VANCOMYCIN HCL 500 MG IV SOLR
500.0000 mg | Freq: Two times a day (BID) | INTRAVENOUS | Status: DC
  Administered 2012-10-17 – 2012-10-18 (×2): 500 mg via INTRAVENOUS
  Filled 2012-10-17 (×3): qty 500

## 2012-10-17 MED ORDER — SUCRALFATE 1 G PO TABS
1.0000 g | ORAL_TABLET | Freq: Four times a day (QID) | ORAL | Status: DC
  Administered 2012-10-17 (×2): 1 g via ORAL
  Filled 2012-10-17 (×7): qty 1

## 2012-10-17 MED ORDER — FOLIC ACID 1 MG PO TABS
1.0000 mg | ORAL_TABLET | Freq: Every day | ORAL | Status: DC
  Administered 2012-10-18 – 2012-10-22 (×5): 1 mg via ORAL
  Filled 2012-10-17 (×5): qty 1

## 2012-10-17 MED ORDER — ADULT MULTIVITAMIN W/MINERALS CH
1.0000 | ORAL_TABLET | Freq: Every morning | ORAL | Status: DC
  Administered 2012-10-18 – 2012-10-22 (×5): 1 via ORAL
  Filled 2012-10-17 (×5): qty 1

## 2012-10-17 MED ORDER — DEXTROSE 5 % IV SOLN
1.0000 g | INTRAVENOUS | Status: DC
  Administered 2012-10-18: 1 g via INTRAVENOUS
  Filled 2012-10-17: qty 1

## 2012-10-17 MED ORDER — METOPROLOL TARTRATE 1 MG/ML IV SOLN
INTRAVENOUS | Status: AC
  Filled 2012-10-17: qty 5

## 2012-10-17 MED ORDER — METOPROLOL TARTRATE 1 MG/ML IV SOLN
2.5000 mg | Freq: Four times a day (QID) | INTRAVENOUS | Status: DC | PRN
  Administered 2012-10-17 – 2012-10-19 (×2): 2.5 mg via INTRAVENOUS
  Filled 2012-10-17: qty 5

## 2012-10-17 MED ORDER — PANTOPRAZOLE SODIUM 40 MG PO TBEC
40.0000 mg | DELAYED_RELEASE_TABLET | Freq: Every day | ORAL | Status: DC
  Administered 2012-10-18 – 2012-10-22 (×5): 40 mg via ORAL
  Filled 2012-10-17 (×5): qty 1

## 2012-10-17 MED ORDER — OXYCODONE-ACETAMINOPHEN 5-325 MG PO TABS
1.0000 | ORAL_TABLET | Freq: Four times a day (QID) | ORAL | Status: DC | PRN
  Administered 2012-10-21 – 2012-10-22 (×2): 1 via ORAL
  Filled 2012-10-17 (×2): qty 1

## 2012-10-17 MED ORDER — FLUOXETINE HCL 20 MG PO CAPS
20.0000 mg | ORAL_CAPSULE | Freq: Every day | ORAL | Status: DC
  Administered 2012-10-20 – 2012-10-22 (×3): 20 mg via ORAL
  Filled 2012-10-17 (×5): qty 1

## 2012-10-17 MED ORDER — ACETAMINOPHEN 325 MG PO TABS
650.0000 mg | ORAL_TABLET | Freq: Four times a day (QID) | ORAL | Status: DC | PRN
  Administered 2012-10-17 – 2012-10-20 (×4): 650 mg via ORAL
  Filled 2012-10-17 (×4): qty 2

## 2012-10-17 MED ORDER — RADIAPLEXRX EX GEL
1.0000 "application " | Freq: Two times a day (BID) | CUTANEOUS | Status: DC | PRN
  Administered 2012-10-19: 1 via TOPICAL
  Filled 2012-10-17: qty 170

## 2012-10-17 MED ORDER — VANCOMYCIN HCL 500 MG IV SOLR
500.0000 mg | Freq: Once | INTRAVENOUS | Status: AC
  Administered 2012-10-17: 500 mg via INTRAVENOUS
  Filled 2012-10-17: qty 500

## 2012-10-17 MED ORDER — SODIUM CHLORIDE 0.9 % IV BOLUS (SEPSIS)
1000.0000 mL | Freq: Once | INTRAVENOUS | Status: AC
  Administered 2012-10-17: 1000 mL via INTRAVENOUS

## 2012-10-17 MED ORDER — DEXTROSE 5 % IV SOLN
1.0000 g | Freq: Once | INTRAVENOUS | Status: AC
  Administered 2012-10-17: 1 g via INTRAVENOUS
  Filled 2012-10-17: qty 1

## 2012-10-17 MED ORDER — SODIUM CHLORIDE 0.9 % IV SOLN
INTRAVENOUS | Status: DC
  Administered 2012-10-17 – 2012-10-18 (×3): via INTRAVENOUS

## 2012-10-17 NOTE — ED Notes (Signed)
WGN:FAOZ<HY> Expected date:<BR> Expected time:<BR> Means of arrival:<BR> Comments:<BR> Elderly back pain,

## 2012-10-17 NOTE — Progress Notes (Signed)
Patient received from ED, patient is alert oriented and with out c/o pain. Will continue to monitor.

## 2012-10-17 NOTE — ED Provider Notes (Signed)
History     CSN: [id number]  Arrival date & time 10/17/12  1014   First MD Initiated Contact with Patient 10/17/12 1026      Chief Complaint  Patient presents with  . Fatigue  . Lung Cancer  . Shortness of Breath    (Consider location/radiation/quality/duration/timing/severity/associated sxs/prior treatment) HPI  Patient reports he was diagnosed with lung cancer, which on reviewing his chart is adenocarcinoma, in March. He finished his radiation treatments 2 days ago. He got his first chemotherapy approximately 4 weeks ago and only felt bad the following week for about one day. He reports he got his second chemotherapy 12 days ago and he has felt bad for the past 5 days. He states "I feel terrible". He states he's tired, has no energy, and wife states that when he walks he staggers. He denies feeling lightheaded or feeling like he is going to pass out. He is eating only half of what he used to eat and has lost about 7 pounds in one week. He states he has a chronic cough however it has been worse the past couple of daysalthough it is still dry. They deny fever but states he has been sweating so much he wets the bed at night. He denies nausea, vomiting, or diarrhea.    PCP Dr Sherwood Gambler Oncologist Dr Gwenyth Bouillon Radiation Oncologist Dr Kathrynn Running  Past Medical History  Diagnosis Date  . Substance abuse     TOBACCO  . Lung mass   . 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 Brain metastases 08/18/12    mri-  . HOH (hard of hearing)     wears hearing aids  . COPD (chronic obstructive pulmonary disease)     Past Surgical History  Procedure Laterality Date  . Wrist surgery    . Lung biopsy Left 08/17/12    LUL lung mass-adenocarcinoma    Family History  Problem Relation Age of Onset  . COPD Father   . Cancer Sister   . Diabetes Brother     History  Substance Use Topics  . Smoking status: Former Smoker -- 1.00 packs/day    Types: Cigarettes    Quit date: 09/28/2012  .  Smokeless tobacco: Never Used     Comment: TRING TO QUIT...DOWN TO 1 PACK PER WEEK  . Alcohol Use: No   Lives at home Lives with wife   Review of Systems  All other systems reviewed and are negative.    Allergies  Review of patient's allergies indicates no known allergies.  Home Medications   Current Outpatient Rx  Name  Route  Sig  Dispense  Refill  . Ascorbic Acid (VITAMIN C) 500 MG CAPS   Oral   Take 1 capsule by mouth every morning.         . chlorproMAZINE (THORAZINE) 25 MG tablet   Oral   Take 1 tablet (25 mg total) by mouth 3 (three) times daily as needed.   45 tablet   5   . cholecalciferol (VITAMIN D) 1000 UNITS tablet   Oral   Take 1,000 Units by mouth every morning.         Marland Kitchen dexamethasone (DECADRON) 4 MG tablet      Milligram by mouth twice a day the day before, day of and day after the chemotherapy every 3 weeks.   30 tablet   1   . diphenhydrAMINE (BENADRYL) 25 MG tablet   Oral   Take 50 mg by mouth at bedtime.         Marland Kitchen  FLUoxetine (PROZAC) 20 MG capsule   Oral   Take 1 capsule (20 mg total) by mouth daily.   30 capsule   3   . folic acid (FOLVITE) 1 MG tablet   Oral   Take 1 tablet (1 mg total) by mouth daily.   30 tablet   4   . hyaluronate sodium (RADIAPLEXRX) GEL   Topical   Apply 1 application topically 2 (two) times daily. Apply after rad txs daily one skin becomes irritated,itching or red         . loratadine (CLARITIN) 10 MG tablet   Oral   Take 10 mg by mouth daily.         . Multiple Vitamin (MULTIVITAMIN WITH MINERALS) TABS   Oral   Take 1 tablet by mouth every morning.         Marland Kitchen omeprazole (PRILOSEC) 20 MG capsule   Oral   Take 20 mg by mouth daily.         Marland Kitchen oxyCODONE-acetaminophen (PERCOCET/ROXICET) 5-325 MG per tablet   Oral   Take 1 tablet by mouth every 6 (six) hours as needed for pain.   30 tablet   0   . prochlorperazine (COMPAZINE) 10 MG tablet   Oral   Take 1 tablet (10 mg total) by mouth  every 6 (six) hours as needed.   60 tablet   0   . sucralfate (CARAFATE) 1 G tablet   Oral   Take 1 tablet (1 g total) by mouth 4 (four) times daily. 5 minutes before meals   90 tablet   5     BP 145/64  Pulse 125  Temp(Src) 98.5 F (36.9 C) (Oral)  Resp 28  SpO2 87%  Vital signs normal    Physical Exam  Nursing note and vitals reviewed. Constitutional: He is oriented to person, place, and time.  Non-toxic appearance. He does not appear ill. No distress.  Looks like he feels bad, thin  HENT:  Head: Normocephalic and atraumatic.  Right Ear: External ear normal.  Left Ear: External ear normal.  Nose: Nose normal. No mucosal edema or rhinorrhea.  Mouth/Throat: Mucous membranes are normal. No dental abscesses or edematous.  Very dry tongue  Eyes: Conjunctivae and EOM are normal. Pupils are equal, round, and reactive to light.  Neck: Normal range of motion and full passive range of motion without pain. Neck supple.  Cardiovascular: Normal rate, regular rhythm and normal heart sounds.  Exam reveals no gallop and no friction rub.   No murmur heard. Pulmonary/Chest: Effort normal. No respiratory distress. He has decreased breath sounds in the left upper field, the left middle field and the left lower field. He has no wheezes. He has no rhonchi. He has no rales. He exhibits no tenderness and no crepitus.  Abdominal: Soft. Normal appearance and bowel sounds are normal. He exhibits no distension. There is no tenderness. There is no rebound and no guarding.  Musculoskeletal: Normal range of motion. He exhibits no edema and no tenderness.  Moves all extremities well.   Neurological: He is alert and oriented to person, place, and time. He has normal strength. No cranial nerve deficit.  Skin: Skin is warm, dry and intact. No rash noted. No erythema. No pallor.  Psychiatric: He has a normal mood and affect. His speech is normal and behavior is normal. His mood appears not anxious.    ED  Course  Procedures (including critical care time)  Medications  vancomycin (VANCOCIN) 500 mg  in sodium chloride 0.9 % 100 mL IVPB (not administered)  vancomycin (VANCOCIN) 500 mg in sodium chloride 0.9 % 100 mL IVPB (not administered)  0.9 %  sodium chloride infusion (not administered)  sodium chloride 0.9 % bolus 1,000 mL (0 mLs Intravenous Stopped 10/17/12 1159)  ceFEPIme (MAXIPIME) 1 g in dextrose 5 % 50 mL IVPB (1 g Intravenous New Bag/Given 10/17/12 1218)   Pt and family given results of his tests. He had been given IV fluids for his dehydration and was started on antibiotics for his HAP and prepared for blood transfusion for his new anemia.   11:56 Dr Elvera Lennox will come see patient to decide what type of bed he needs on admission (tele vs stepdown).  Results for orders placed during the hospital encounter of 10/17/12  CBC WITH DIFFERENTIAL      Result Value Range   WBC 2.4 (*) 4.0 - 10.5 K/uL   RBC 2.64 (*) 4.22 - 5.81 MIL/uL   Hemoglobin 8.0 (*) 13.0 - 17.0 g/dL   HCT 96.0 (*) 45.4 - 09.8 %   MCV 86.7  78.0 - 100.0 fL   MCH 30.3  26.0 - 34.0 pg   MCHC 34.9  30.0 - 36.0 g/dL   RDW 11.9  14.7 - 82.9 %   Platelets 34 (*) 150 - 400 K/uL   Neutrophils Relative % 93 (*) 43 - 77 %   Lymphocytes Relative 4 (*) 12 - 46 %   Monocytes Relative 3  3 - 12 %   Eosinophils Relative 0  0 - 5 %   Basophils Relative 0  0 - 1 %   Neutro Abs 2.2  1.7 - 7.7 K/uL   Lymphs Abs 0.1 (*) 0.7 - 4.0 K/uL   Monocytes Absolute 0.1  0.1 - 1.0 K/uL   Eosinophils Absolute 0.0  0.0 - 0.7 K/uL   Basophils Absolute 0.0  0.0 - 0.1 K/uL   WBC Morphology INCREASED BANDS (>20% BANDS)    COMPREHENSIVE METABOLIC PANEL      Result Value Range   Sodium 130 (*) 135 - 145 mEq/L   Potassium 3.9  3.5 - 5.1 mEq/L   Chloride 92 (*) 96 - 112 mEq/L   CO2 26  19 - 32 mEq/L   Glucose, Bld 93  70 - 99 mg/dL   BUN 29 (*) 6 - 23 mg/dL   Creatinine, Ser 5.62 (*) 0.50 - 1.35 mg/dL   Calcium 9.5  8.4 - 13.0 mg/dL   Total  Protein 6.8  6.0 - 8.3 g/dL   Albumin 2.1 (*) 3.5 - 5.2 g/dL   AST 44 (*) 0 - 37 U/L   ALT 42  0 - 53 U/L   Alkaline Phosphatase 155 (*) 39 - 117 U/L   Total Bilirubin 0.6  0.3 - 1.2 mg/dL   GFR calc non Af Amer 50 (*) >90 mL/min   GFR calc Af Amer 58 (*) >90 mL/min  TROPONIN I      Result Value Range   Troponin I <0.30  <0.30 ng/mL  URINALYSIS, ROUTINE W REFLEX MICROSCOPIC      Result Value Range   Color, Urine YELLOW  YELLOW   APPearance CLEAR  CLEAR   Specific Gravity, Urine 1.021  1.005 - 1.030   pH 5.0  5.0 - 8.0   Glucose, UA NEGATIVE  NEGATIVE mg/dL   Hgb urine dipstick TRACE (*) NEGATIVE   Bilirubin Urine NEGATIVE  NEGATIVE   Ketones, ur NEGATIVE  NEGATIVE mg/dL  Protein, ur 30 (*) NEGATIVE mg/dL   Urobilinogen, UA 1.0  0.0 - 1.0 mg/dL   Nitrite NEGATIVE  NEGATIVE   Leukocytes, UA NEGATIVE  NEGATIVE  URINE MICROSCOPIC-ADD ON      Result Value Range   WBC, UA 0-2  <3 WBC/hpf  TYPE AND SCREEN      Result Value Range   ABO/RH(D) A POS     Antibody Screen PENDING     Sample Expiration 10/20/2012    PREPARE RBC (CROSSMATCH)      Result Value Range   Order Confirmation ORDER PROCESSED BY BLOOD BANK     Laboratory interpretation all normal except anemia (down from 5/5 of 13), hyponatremia and low chloride c/w dehydration, low total WBC without neutropenia   Dg Chest Portable 1 View  10/17/2012   *RADIOLOGY REPORT*  Clinical Data: History of lung cancer, short of breath and increasing cough  PORTABLE CHEST - 1 VIEW  Comparison: Most recent prior PET CT 08/02/2012; prior chest x-ray 07/23/2012  Findings: New irregular airspace opacity in the right middle lobe obscuring the right heart border.  Left upper lobe pulmonary mass again noted appears slightly decreased in size, query interval response to therapy.  Background emphysema and COPD.  Cardiac and mediastinal contours are unchanged.  No pneumothorax.  IMPRESSION:  1.  New patchy airspace disease in the right middle lobe  concerning for pneumonia or aspiration.  Metastatic disease would be unlikely and the relatively short interval since the prior cross-sectional imaging. 2.  The left apical pulmonary mass is slightly smaller in appearance compared to prior which may reflect an interval response to therapy. 3.  Chronic background lung disease including emphysema and COPD.   Original Report Authenticated By: Malachy Moan, M.D.      Date: 10/17/2012  Rate: 124  Rhythm: sinus tachycardia and premature ventricular contractions (PVC)  QRS Axis: left  Intervals: normal  ST/T Wave abnormalities: normal  Conduction Disutrbances:none  Narrative Interpretation:   Old EKG Reviewed: changes noted from 07/23/2012 HR was 87     1. Healthcare-associated pneumonia   2. Hyponatremia   3. Serum chloride decreased   4. Anemia   5. Weakness   6. Fatigue   7. Dehydration   8. Brain metastases   9. Community acquired pneumonia   10. Sepsis   11. Pancytopenia due to chemotherapy   12. AKI (acute kidney injury)   13. Malignant neoplasm of upper lobe, bronchus or lung, left    Plan admission   Devoria Albe, MD, FACEP    MDM          Ward Givens, MD 10/17/12 1258

## 2012-10-17 NOTE — Progress Notes (Signed)
ANTIBIOTIC CONSULT NOTE - INITIAL  Pharmacy Consult for Vnac Indication: pneumonia  No Known Allergies  Patient Measurements:     Vital Signs: Temp: 98.5 F (36.9 C) (06/08 1031) Temp src: Oral (06/08 1031) BP: 145/64 mmHg (06/08 1031) Pulse Rate: 125 (06/08 1031) Intake/Output from previous day:   Intake/Output from this shift:    Labs:  Recent Labs  10/17/12 1045  WBC 2.4*  HGB 8.0*  PLT PENDING  CREATININE 1.36*   The CrCl is unknown because both a height and weight (above a minimum accepted value) are required for this calculation. No results found for this basename: VANCOTROUGH, VANCOPEAK, VANCORANDOM, GENTTROUGH, GENTPEAK, GENTRANDOM, TOBRATROUGH, TOBRAPEAK, TOBRARND, AMIKACINPEAK, AMIKACINTROU, AMIKACIN,  in the last 72 hours   Microbiology: No results found for this or any previous visit (from the past 720 hour(s)).  Medical History: Past Medical History  Diagnosis Date  . Substance abuse     TOBACCO  . Lung mass   . 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 Brain metastases 08/18/12    mri-  . HOH (hard of hearing)     wears hearing aids  . COPD (chronic obstructive pulmonary disease)     Medications:  Anti-infectives   Start     Dose/Rate Route Frequency Ordered Stop   10/18/12 0000  vancomycin (VANCOCIN) 500 mg in sodium chloride 0.9 % 100 mL IVPB     500 mg 100 mL/hr over 60 Minutes Intravenous Every 12 hours 10/17/12 1140     10/17/12 1145  ceFEPIme (MAXIPIME) 1 g in dextrose 5 % 50 mL IVPB     1 g 100 mL/hr over 30 Minutes Intravenous  Once 10/17/12 1137     10/17/12 1145  vancomycin (VANCOCIN) 500 mg in sodium chloride 0.9 % 100 mL IVPB     500 mg 100 mL/hr over 60 Minutes Intravenous  Once 10/17/12 1139       Assessment: 71 YOM w/ hx lung cancer w/ mets undergoing systemic chemotherapy with Carbo and Alimta q 3 weeks (last C3 was 5/26) and palliative XRT to L lung mass admitted 6/8 w/ fatigue and shortness of breath. CXR suggest  PNA. Pharmacy asked to dose Vancomycin  Scr slightly elevated, 1.36, CrCl ~ 49 ml/min/1.76m2.   WBC low  Afebrile  No cx ordered yet  Goal of Therapy:  Vancomycin trough level 15-20 mcg/ml  Plan:   Vancomycin 500mg  IV q12h  Follow labs, vitals and cultures  VT @ Css or sooner if warranted  Adjust dose as necessary  Cefepime 1g IV x 1 dose ordered in ER - MD, please order 1g IV q8h if you wish to continue.  Gwen Her PharmD  [phone] 10/17/2012 11:47 AM

## 2012-10-17 NOTE — Telephone Encounter (Signed)
Medical Oncology on call  Wife calling as patient has had difficulty since ~ 10-11-12 with excessive sleeping and "staggering so he can hardly walk". She will bring to ED.  L.Zaynab Chipman, MD [phone]

## 2012-10-17 NOTE — H&P (Addendum)
[facility] History and Physical  MIRANDA GARBER WJX:[id number] DOB: Jun 02, 1940 DOA: 10/17/2012  Referring physician: Dr. Lynelle Doctor PCP: Cassell Smiles., MD  Specialists: none  Chief Complaint: cough, weakness, night sweats  HPI: Luis Payne is a 72 y.o. 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 He denies any lightheadedness or dizziness. He denies any chest pain. He denies shortness of breath at rest, however he gets short-winded with walking.Marland Kitchen He has no nausea, vomiting or diarrhea. He denies any blood in his stool or blood in his urine. In the emergency room, patient was tachypneic and tachycardic, and a chest x-ray showed right middle lobe pneumonia. Blood work significant for mild hyponatremia, worsening of his renal function, and pancytopenia. [facility] have been asked to admission.  Review of Systems: As per history of present illness, otherwise negative  Past Medical History  Diagnosis Date  . Substance abuse     TOBACCO  . Lung mass   . 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 Brain metastases 08/18/12    mri-  . HOH (hard of hearing)     wears hearing aids  . COPD (chronic obstructive pulmonary disease)    Past Surgical History  Procedure Laterality Date  . Wrist surgery    . Lung biopsy Left 08/17/12    LUL lung mass-adenocarcinoma   Social History:  reports that he quit smoking about 2 weeks ago. His smoking use included Cigarettes. He smoked 1.00 pack per day. He has never used smokeless tobacco. He reports that he does not  drink alcohol or use illicit drugs.  No Known Allergies  Family History  Problem Relation Age of Onset  . COPD Father   . Cancer Sister   . Diabetes Brother    Prior to Admission medications   Medication Sig Start Date End Date Taking? Authorizing Provider  Ascorbic Acid (VITAMIN C) 500 MG CAPS Take 1 capsule by mouth every morning.   Yes Historical Provider, MD  chlorproMAZINE (THORAZINE) 25 MG tablet Take 25 mg by mouth 3 (three) times daily as needed (for hiccups).   Yes Historical Provider, MD  cholecalciferol (VITAMIN D) 1000 UNITS tablet Take 1,000 Units by mouth every morning.   Yes Historical Provider, MD  dexamethasone (DECADRON) 4 MG tablet Take 4 mg by mouth 2 (two) times daily with a meal. Day before, day of and day after chemo.   Yes Historical Provider, MD  FLUoxetine (PROZAC) 20 MG capsule Take 1 capsule (20 mg total) by mouth daily. 09/27/12  Yes Oneita Hurt, MD  folic acid (FOLVITE) 1 MG tablet Take 1 tablet (1 mg total) by mouth daily. 09/07/12  Yes Si Gaul, MD  hyaluronate sodium (RADIAPLEXRX) GEL Apply 1 application topically 2 (two) times daily as needed (for radiation burns).  09/10/12  Yes Oneita Hurt, MD  Multiple Vitamin (MULTIVITAMIN WITH MINERALS) TABS Take 1 tablet by mouth every morning.   Yes Historical Provider, MD  omeprazole (PRILOSEC) 20 MG capsule Take 20 mg by mouth daily.   Yes Historical Provider, MD  oxyCODONE-acetaminophen (PERCOCET/ROXICET) 5-325 MG per tablet Take 1 tablet by mouth every 6 (six) hours as needed for pain. 09/07/12  Yes Si Gaul, MD  PRESCRIPTION MEDICATION Inject into the vein every 21 ( twenty-one) days. Alimta and Paraplatin infusion q21d.   Yes Historical Provider, MD  sucralfate (CARAFATE) 1 G tablet Take 1 tablet (1 g total) by mouth 4 (four) times daily. 5 minutes before meals 10/13/12  Yes Oneita Hurt, MD   Physical Exam: Filed Vitals:   10/17/12 1031  BP: 145/64  Pulse: 125  Temp: 98.5 F (36.9  C)  TempSrc: Oral  Resp: 28  SpO2: 87%     General:  Appears comfortable, however ill-appearing.  Eyes: PERRL, EOMI, no scleral icterus  Neck: supple, no JVD  Cardiovascular: regular rate without MRG; 2+ peripheral pulses  Respiratory: Distant breath sounds, no wheezing  Abdomen: soft, non tender to palpation, positive bowel sounds, no guarding, no rebound  Skin: Mild erythema on left anterior chest and left scapular area  Musculoskeletal: no peripheral edema  Psychiatric: normal mood and affect  Neurologic: CN 2-12 grossly intact, MS 5/5 in all 4, no focal findings.  Labs on Admission:  Basic Metabolic Panel:  Recent Labs Lab 10/11/12 0949 10/17/12 1045  NA 138 130*  K 4.0 3.9  CL 104 92*  CO2 26 26  GLUCOSE 118* 93  BUN 29.9* 29*  CREATININE 1.0 1.36*  CALCIUM 9.4 9.5   Liver Function Tests:  Recent Labs Lab 10/11/12 0949 10/17/12 1045  AST 21 44*  ALT 33 42  ALKPHOS 100 155*  BILITOT 0.53 0.6  PROT 6.9 6.8  ALBUMIN 2.5* 2.1*   CBC:  Recent Labs Lab 10/11/12 0949 10/17/12 1045  WBC 2.1* 2.4*  NEUTROABS 1.9 2.2  HGB 10.5* 8.0*  HCT 29.9* 22.9*  MCV 90.9 86.7  PLT 199 34*   Cardiac Enzymes:  Recent Labs Lab 10/17/12 1045  TROPONINI <0.30   Radiological Exams on Admission: Dg Chest Portable 1 View  10/17/2012   *RADIOLOGY REPORT*  Clinical Data: History of lung cancer, short of breath and increasing cough  PORTABLE CHEST - 1 VIEW  Comparison: Most recent prior PET CT 08/02/2012; prior chest x-ray 07/23/2012  Findings: New irregular airspace opacity in the right middle lobe obscuring the right heart border.  Left upper lobe pulmonary mass again noted appears slightly decreased in size, query interval response to therapy.  Background emphysema and COPD.  Cardiac and mediastinal contours are unchanged.  No pneumothorax.  IMPRESSION:  1.  New patchy airspace disease in the right middle lobe concerning for pneumonia or aspiration.  Metastatic  disease would be unlikely and the relatively short interval since the prior cross-sectional imaging. 2.  The left apical pulmonary mass is slightly smaller in appearance compared to prior which may reflect an interval response to therapy. 3.  Chronic background lung disease including emphysema and COPD.   Original Report Authenticated By: Malachy Moan, M.D.   EKG: Independently reviewed. Sinus tachycardia.   Assessment/Plan Active Problems:   Lung mass   3 subcentimeter brain metastases from adenocarcinoma of the lung   Malignant neoplasm of upper lobe, bronchus or lung   Brain metastases   Community acquired pneumonia   Immunocompromised   Sepsis   Hyponatremia   AKI (acute kidney injury)   Sepsis due to community-acquired pneumonia - Patient is currently under chemotherapy and has some view of immunosuppression, we'll start vancomycin and cefepime. - Supportive oxygen as needed 4 oxygen saturation greater  than 88%. Patient has a history of difficulties obtaining adequate pulse oximetry readings including in his multiple visits to the radiation therapy suite - Continue IV fluids bolused in the ED and at a rate of 150 per hour given tachycardia  Lung adenocarcinoma with metastasis to the brain - Will call Dr. Arbutus Ped tomorrow -  patient was on Decadron when he had the brain irradiation, has not taken that probably within the last month. If he becomes hypotensive in the setting of acute illness, might need stress dose steroids. He is currently normotensive.  Pancytopenia - Likely due to chronic illness as well as ongoing chemotherapy - No active bleeding, continue to monitor  Skin rash - Very mild hyperpigmentation, due to radiation therapy. Continue home topical gel.   Acute kidney injury - Likely due to dehydration, we'll continue IV fluids and monitor in the morning with a repeat BMP.  Weakness - Likely due to acute illness, will get PT to evaluate his ambulation - Will  consider repeat MRI of the brain given his reports of "staggering" when walking  Hyponatremia - Hypovolemic, will monitor with hydration.  Tobacco abuse - Patient smoked for about 60 years. Quit 2 weeks ago. Congratulated patient on cessation.  DVT prophylaxis - SCDs given thrombocytopenia  Code Status: DNR  Family Communication: wife/son  Disposition Plan: inpatient  Time spent: 18  Costin M. Elvera Lennox, MD [facility] Pager [phone]  If 7PM-7AM, please contact night-coverage [email or web address] Password [facility] 10/17/2012, 12:32 PM

## 2012-10-17 NOTE — ED Notes (Addendum)
Pt reports diagnosis of lung cancer with brain mets in March, last chemo 2 weeks ago, has been very week and fatigued since. Reports loosing 7 lbs in 1 week. Next (second) chemo treatment is next week. Pt of Dr. Gwenyth Bouillon

## 2012-10-18 ENCOUNTER — Other Ambulatory Visit: Payer: Medicare Other

## 2012-10-18 DIAGNOSIS — E871 Hypo-osmolality and hyponatremia: Secondary | ICD-10-CM

## 2012-10-18 DIAGNOSIS — C3410 Malignant neoplasm of upper lobe, unspecified bronchus or lung: Secondary | ICD-10-CM

## 2012-10-18 DIAGNOSIS — C801 Malignant (primary) neoplasm, unspecified: Secondary | ICD-10-CM

## 2012-10-18 DIAGNOSIS — N179 Acute kidney failure, unspecified: Secondary | ICD-10-CM

## 2012-10-18 DIAGNOSIS — C7949 Secondary malignant neoplasm of other parts of nervous system: Secondary | ICD-10-CM

## 2012-10-18 DIAGNOSIS — J189 Pneumonia, unspecified organism: Secondary | ICD-10-CM

## 2012-10-18 DIAGNOSIS — D849 Immunodeficiency, unspecified: Secondary | ICD-10-CM

## 2012-10-18 DIAGNOSIS — C7931 Secondary malignant neoplasm of brain: Secondary | ICD-10-CM

## 2012-10-18 LAB — CBC WITH DIFFERENTIAL/PLATELET
Basophils Relative: 0 % (ref 0–1)
Eosinophils Absolute: 0 10*3/uL (ref 0.0–0.7)
HCT: 22.4 % — ABNORMAL LOW (ref 39.0–52.0)
Hemoglobin: 7.8 g/dL — ABNORMAL LOW (ref 13.0–17.0)
MCH: 29.8 pg (ref 26.0–34.0)
MCHC: 34.8 g/dL (ref 30.0–36.0)
Monocytes Absolute: 0.1 10*3/uL (ref 0.1–1.0)
Neutro Abs: 2.2 10*3/uL (ref 1.7–7.7)

## 2012-10-18 LAB — COMPREHENSIVE METABOLIC PANEL
ALT: 34 U/L (ref 0–53)
AST: 40 U/L — ABNORMAL HIGH (ref 0–37)
Albumin: 1.6 g/dL — ABNORMAL LOW (ref 3.5–5.2)
Alkaline Phosphatase: 128 U/L — ABNORMAL HIGH (ref 39–117)
Potassium: 3.3 mEq/L — ABNORMAL LOW (ref 3.5–5.1)
Sodium: 132 mEq/L — ABNORMAL LOW (ref 135–145)
Total Protein: 5.5 g/dL — ABNORMAL LOW (ref 6.0–8.3)

## 2012-10-18 MED ORDER — SUCRALFATE 1 G PO TABS
1.0000 g | ORAL_TABLET | Freq: Four times a day (QID) | ORAL | Status: DC
  Filled 2012-10-18 (×4): qty 1

## 2012-10-18 MED ORDER — ENSURE PUDDING PO PUDG
1.0000 | ORAL | Status: DC
  Filled 2012-10-18 (×4): qty 1

## 2012-10-18 MED ORDER — LEVOFLOXACIN IN D5W 500 MG/100ML IV SOLN
500.0000 mg | INTRAVENOUS | Status: DC
  Administered 2012-10-18 – 2012-10-20 (×3): 500 mg via INTRAVENOUS
  Filled 2012-10-18 (×5): qty 100

## 2012-10-18 MED ORDER — ENSURE COMPLETE PO LIQD
237.0000 mL | Freq: Two times a day (BID) | ORAL | Status: DC
  Administered 2012-10-18 – 2012-10-22 (×8): 237 mL via ORAL

## 2012-10-18 MED ORDER — SODIUM CHLORIDE 0.9 % IV BOLUS (SEPSIS)
1000.0000 mL | Freq: Once | INTRAVENOUS | Status: AC
  Administered 2012-10-18: 1000 mL via INTRAVENOUS

## 2012-10-18 MED ORDER — SODIUM CHLORIDE 0.9 % IV SOLN
INTRAVENOUS | Status: AC
  Administered 2012-10-18: 21:00:00 via INTRAVENOUS
  Administered 2012-10-18: 150 mL/h via INTRAVENOUS
  Administered 2012-10-19: 04:00:00 via INTRAVENOUS

## 2012-10-18 MED ORDER — POTASSIUM CHLORIDE CRYS ER 20 MEQ PO TBCR
30.0000 meq | EXTENDED_RELEASE_TABLET | Freq: Once | ORAL | Status: AC
  Administered 2012-10-18: 30 meq via ORAL
  Filled 2012-10-18: qty 1

## 2012-10-18 MED ORDER — SUCRALFATE 1 GM/10ML PO SUSP
1.0000 g | Freq: Three times a day (TID) | ORAL | Status: DC
  Administered 2012-10-18 – 2012-10-22 (×13): 1 g via ORAL
  Filled 2012-10-18 (×20): qty 10

## 2012-10-18 NOTE — Progress Notes (Signed)
DIAGNOSIS: Metastatic non-small cell lung cancer, adenocarcinoma with negative EGFR mutation and negative ALK gene translocation diagnosed in April 2014  PRIOR THERAPY: Status post stereotactic radiotherapy to 2 brain lesions under the care of Dr. Kathrynn Running  CURRENT THERAPY:  1) Systemic chemotherapy with carboplatin for AUC of 5 and Alimta 500 mg/M2 every 3 weeks, status post 1 cycle. First dose was given on May, 05, 2014.  2) palliative radiotherapy to the left lung mass.   Subjective: The patient is seen and examined today. His wife was at the bedside. He was admitted with cough as well as weakness and night sweats in addition to lack of energy. During his evaluation at the emergency department he was found to have white blood count of 2.4 hemoglobin was down to 8.0 and platelets count of 34,000. The patient was admitted and started on Levaquin 500 mg IV. He is feeling a little bit better today but continues to have significant weakness and fatigue.  Objective: Vital signs in last 24 hours: Temp:  [97.4 F (36.3 C)-102 F (38.9 C)] 100.1 F (37.8 C) (06/09 1806) Pulse Rate:  [103-132] 132 (06/09 1344) Resp:  [18-22] 18 (06/09 1344) BP: (100-135)/(48-67) 135/67 mmHg (06/09 1344) SpO2:  [75 %-94 %] 92 % (06/09 1344)  Intake/Output from previous day: 06/08 0701 - 06/09 0700 In: 1360 [P.O.:60; I.V.:1200; IV Piggyback:100] Out: 825 [Urine:825] Intake/Output this shift:    General appearance: alert, cooperative, fatigued and no distress Resp: clear to auscultation bilaterally Cardio: regular rate and rhythm, S1, S2 normal, no murmur, click, rub or gallop GI: soft, non-tender; bowel sounds normal; no masses,  no organomegaly Extremities: extremities normal, atraumatic, no cyanosis or edema  Lab Results:   Recent Labs  10/17/12 1045 10/18/12 0500  WBC 2.4* 2.4*  HGB 8.0* 7.8*  HCT 22.9* 22.4*  PLT 34* 46*   BMET  Recent Labs  10/17/12 1045 10/18/12 0500  NA 130* 132*  K  3.9 3.3*  CL 92* 99  CO2 26 24  GLUCOSE 93 109*  BUN 29* 31*  CREATININE 1.36* 1.22  CALCIUM 9.5 8.7    Studies/Results: Dg Chest Portable 1 View  10/17/2012   *RADIOLOGY REPORT*  Clinical Data: History of lung cancer, short of breath and increasing cough  PORTABLE CHEST - 1 VIEW  Comparison: Most recent prior PET CT 08/02/2012; prior chest x-ray 07/23/2012  Findings: New irregular airspace opacity in the right middle lobe obscuring the right heart border.  Left upper lobe pulmonary mass again noted appears slightly decreased in size, query interval response to therapy.  Background emphysema and COPD.  Cardiac and mediastinal contours are unchanged.  No pneumothorax.  IMPRESSION:  1.  New patchy airspace disease in the right middle lobe concerning for pneumonia or aspiration.  Metastatic disease would be unlikely and the relatively short interval since the prior cross-sectional imaging. 2.  The left apical pulmonary mass is slightly smaller in appearance compared to prior which may reflect an interval response to therapy. 3.  Chronic background lung disease including emphysema and COPD.   Original Report Authenticated By: Malachy Moan, M.D.    Medications: I have reviewed the patient's current medications.  Assessment/Plan: This is a very pleasant 72 years old white male with metastatic non-small cell lung cancer, adenocarcinoma currently undergoing systemic chemotherapy was carboplatin and Alimta status post 2 cycles. He was admitted with significant fatigue and weakness secondary to his recent chemotherapy as well as pancytopenia. Continue IV hydration and antibiotics. I will continue to  monitor his CBC closely for now. Thank you for taking good care of [name]. I will continue to follow up the patient with you and assist in his management an as-needed basis.   LOS: 1 day    [name] 10/18/2012

## 2012-10-18 NOTE — Progress Notes (Signed)
INITIAL NUTRITION ASSESSMENT  DOCUMENTATION CODES Per approved criteria  -Not Applicable   INTERVENTION: Provide Ensure Complete BID Provide Magic Cup once daily Provide Ensure Pudding once daily  Contiue Multivitamin with minerals daily  NUTRITION DIAGNOSIS: Inadequate oral intake related to poor appetite, SOB, and swallowing difficulty as evidenced by 5% wt loss in less than 1 month.   Goal: Pt to meet >/= 90% of their estimated nutrition needs  Monitor:  PO intake  Weight Labs  Reason for Assessment: Malnutrition Screening Tool, score of 2  72 y.o. male  Admitting Dx: Sepsis due to CAP  ASSESSMENT: 72 y.o. 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 Found to have PNA on CXR.  Pt reports that he has had a very poor appetite for the past week as well as difficulty swallowing due to having a sore throat after radiation. Pt also is having breathing difficulty making it hard to eat. Pt reports that he usually weighs in between 130 and 140 lbs. Pt has been drinking Ensure supplements once or twice daily at home. Pt is eating about 50% of his meals and states he was eating better PTA. Reviewed protein-rich foods and encourage pt to get incorporate protein-rich foods at each meal; pt states he ate an entire fish filet at dinner last night. Discussed tips for swallowing difficulty.  Height: Ht Readings from Last 1 Encounters:  10/17/12 5\' 5"  (1.651 m)    Weight: Wt Readings from Last 1 Encounters:  10/17/12 122 lb 11.2 oz (55.656 kg)    Ideal Body  Weight: 136 lbs  % Ideal Body Weight: 90%  Wt Readings from Last 10 Encounters:  10/17/12 122 lb 11.2 oz (55.656 kg)  10/13/12 122 lb 11.2 oz (55.656 kg)  10/08/12 129 lb 11.2 oz (58.832 kg)  10/05/12 124 lb 14.4 oz (56.654 kg)  10/01/12 128 lb (58.06 kg)  09/27/12 125 lb 11.2 oz (57.017 kg)  09/24/12 125 lb 12.8 oz (57.063 kg)  09/21/12 124 lb 6.4 oz (56.427 kg)  09/17/12 128 lb 6.4 oz (58.242 kg)  09/10/12 126 lb 11.2 oz (57.471 kg)    Usual Body Weight: 130-140 lbs  % Usual Body Weight: 90-94%  BMI:  Body mass index is 20.42 kg/(m^2).  Estimated Nutritional Needs: Kcal: 1620-1850 Protein: 78-89 grams Fluid: 1.7-2 L  Skin: WDL  Diet Order: General  EDUCATION NEEDS: -No education needs identified at this time   Intake/Output Summary (Last 24 hours) at 10/18/12 1435 Last data filed at 10/18/12 1345  Gross per 24 hour  Intake   2080 ml  Output   1150 ml  Net    930 ml    Last BM: 6/9  Labs:   Recent Labs Lab 10/17/12 1045 10/18/12 0500  NA 130* 132*  K 3.9 3.3*  CL 92* 99  CO2 26 24  BUN 29* 31*  CREATININE 1.36* 1.22  CALCIUM 9.5 8.7  MG  --  1.9  GLUCOSE 93 109*    CBG (last 3)  No results found for this basename: GLUCAP,  in the last 72  hours  Scheduled Meds: . ceFEPime (MAXIPIME) IV  1 g Intravenous Q24H  . FLUoxetine  20 mg Oral Daily  . folic acid  1 mg Oral Daily  . multivitamin with minerals  1 tablet Oral q morning - 10a  . pantoprazole  40 mg Oral Daily  . sucralfate  1 g Oral TID WC & HS  . vancomycin  500 mg Intravenous Q12H    Continuous Infusions: . sodium chloride      Past Medical History  Diagnosis Date  . Substance abuse     TOBACCO  . Lung mass   . 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 Brain metastases 08/18/12    mri-  . HOH (hard of hearing)     wears hearing aids  . COPD (chronic obstructive pulmonary disease)     Past Surgical History  Procedure Laterality Date  . Wrist surgery    . Lung  biopsy Left 08/17/12    LUL lung mass-adenocarcinoma    Ian Malkin RD, LDN Inpatient Clinical Dietitian Pager: [phone] After Hours Pager: [phone]

## 2012-10-18 NOTE — Progress Notes (Signed)
[facility] PROGRESS NOTE  [name] WUJ:[id number] DOB: 06/14/1940 DOA: 10/17/2012 PCP: Cassell Smiles., MD  HPI: Luis Payne is a 72 y.o. 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 Found to have PNA on CXR.  Assessment/Plan: Sepsis due to community-acquired pneumonia  - febrile and tachycardic overnight, continue vancomycin and cefepime.  - Supportive oxygen as needed 4 oxygen saturation greater than 88%. Patient has a history of difficulties obtaining adequate pulse oximetry readings including in his multiple visits to the radiation therapy suite  - will determine O2 needs while hospitalized by measuring O2 levels with ambulation Lung adenocarcinoma with metastasis to the brain  - patient was on Decadron when he had the brain irradiation, has not taken that probably within the last month. If he becomes hypotensive in the setting of acute illness, might need stress dose steroids. He is currently normotensive.  Pancytopenia  - Likely due to chronic illness as well as ongoing chemotherapy  - No active bleeding, continue to monitor  Skin rash  - Very mild hyperpigmentation, due to radiation therapy. Continue home topical gel.  Acute kidney injury  - improving, continue IVF Weakness  - Likely due to acute illness, will get PT to evaluate his ambulation  Hyponatremia  - Hypovolemic, will monitor with hydration.  - improving Tobacco abuse  - Patient smoked for about 60 years. Quit 2 weeks ago. DVT prophylaxis  - SCDs given thrombocytopenia  Code  Status: DNR Family Communication: wife bedside  Disposition Plan: home when medically stable  Consultants:  Dr. Arbutus Ped as primary oncologist  Procedures:  none  Antibiotics:  Anti-infectives   Start     Dose/Rate Route Frequency Ordered Stop   10/18/12 1200  ceFEPIme (MAXIPIME) 1 g in dextrose 5 % 50 mL IVPB     1 g 100 mL/hr over 30 Minutes Intravenous Every 24 hours 10/17/12 1527     10/18/12 0000  vancomycin (VANCOCIN) 500 mg in sodium chloride 0.9 % 100 mL IVPB     500 mg 100 mL/hr over 60 Minutes Intravenous Every 12 hours 10/17/12 1140     10/17/12 1145  ceFEPIme (MAXIPIME) 1 g in dextrose 5 % 50 mL IVPB     1 g 100 mL/hr over 30 Minutes Intravenous  Once 10/17/12 1137 10/17/12 1248   10/17/12 1145  vancomycin (VANCOCIN) 500 mg in sodium chloride 0.9 % 100 mL IVPB     500 mg 100 mL/hr over 60 Minutes Intravenous  Once 10/17/12 1139 10/17/12 1437     Antibiotics Given (last 72 hours)   Date/Time Action Medication Dose Rate   10/17/12 1337 Given   vancomycin (VANCOCIN) 500 mg in sodium chloride 0.9 % 100 mL IVPB 500 mg 100 mL/hr   10/17/12 2351 Given   vancomycin (VANCOCIN) 500 mg in sodium chloride 0.9 % 100 mL IVPB 500 mg 100 mL/hr      HPI/Subjective: - feels well this morning, denies chest pain. No SOB at rest  Objective: Filed Vitals:   10/17/12 2254 10/17/12 2350 10/18/12 0058 10/18/12 0607  BP: 131/67  119/57 106/48 100/59  Pulse: 131 127 114 103  Temp: 100.4 F (38 C) 102 F (38.9 C) 99.7 F (37.6 C) 97.4 F (36.3 C)  TempSrc: Oral Axillary Oral Oral  Resp: 22 22 22 20   Height:      Weight:      SpO2: 93%  92% 94%    Intake/Output Summary (Last 24 hours) at 10/18/12 0939 Last data filed at 10/18/12 0700  Gross per 24 hour  Intake   1360 ml  Output    825 ml  Net    535 ml   Filed Weights   10/17/12 1321  Weight: 55.656 kg (122 lb 11.2 oz)    Exam:   General:  NAD  Cardiovascular: regular rate and rhythm, without  MRG  Respiratory: distant breath sounds, no wheezing, decreased breath sounds on left  Abdomen: soft, not tender to palpation, positive bowel sounds  MSK: no peripheral edema  Neuro: non focal  Data Reviewed: Basic Metabolic Panel:  Recent Labs Lab 10/11/12 0949 10/17/12 1045 10/18/12 0500  NA 138 130* 132*  K 4.0 3.9 3.3*  CL 104 92* 99  CO2 26 26 24   GLUCOSE 118* 93 109*  BUN 29.9* 29* 31*  CREATININE 1.0 1.36* 1.22  CALCIUM 9.4 9.5 8.7  MG  --   --  1.9   Liver Function Tests:  Recent Labs Lab 10/17/12 1045 10/18/12 0500  AST 44* 40*  ALT 42 34  ALKPHOS 155* 128*  BILITOT 0.6 0.9  PROT 6.8 5.5*  ALBUMIN 2.1* 1.6*   CBC:  Recent Labs Lab 10/11/12 0949 10/17/12 1045 10/18/12 0500  WBC 2.1* 2.4* 2.4*  NEUTROABS 1.9 2.2 2.2  HGB 10.5* 8.0* 7.8*  HCT 29.9* 22.9* 22.4*  MCV 90.9 86.7 85.5  PLT 199 34* 46*   Cardiac Enzymes:  Recent Labs Lab 10/17/12 1045  TROPONINI <0.30   Studies: Dg Chest Portable 1 View  10/17/2012   *RADIOLOGY REPORT*  Clinical Data: History of lung cancer, short of breath and increasing cough  PORTABLE CHEST - 1 VIEW  Comparison: Most recent prior PET CT 08/02/2012; prior chest x-ray 07/23/2012  Findings: New irregular airspace opacity in the right middle lobe obscuring the right heart border.  Left upper lobe pulmonary mass again noted appears slightly decreased in size, query interval response to therapy.  Background emphysema and COPD.  Cardiac and mediastinal contours are unchanged.  No pneumothorax.  IMPRESSION:  1.  New patchy airspace disease in the right middle lobe concerning for pneumonia or aspiration.  Metastatic disease would be unlikely and the relatively short interval since the prior cross-sectional imaging. 2.  The left apical pulmonary mass is slightly smaller in appearance compared to prior which may reflect an interval response to therapy. 3.  Chronic background lung disease including emphysema and COPD.   Original  Report Authenticated By: Malachy Moan, M.D.    Scheduled Meds: . ceFEPime (MAXIPIME) IV  1 g Intravenous Q24H  . FLUoxetine  20 mg Oral Daily  . folic acid  1 mg Oral Daily  . multivitamin with minerals  1 tablet Oral q morning - 10a  . pantoprazole  40 mg Oral Daily  . sucralfate  1 g Oral TID WC & HS  . vancomycin  500 mg Intravenous Q12H   Continuous Infusions: . sodium chloride 150 mL/hr at 10/18/12 1610    Active Problems:   Lung mass   3 subcentimeter brain metastases from adenocarcinoma of the lung   Malignant  neoplasm of upper lobe, bronchus or lung   Brain metastases   Community acquired pneumonia   Immunocompromised   Sepsis   Hyponatremia   AKI (acute kidney injury)  Time spent: 25  Pamella Pert, MD [facility] Pager [phone]. If 7 PM - 7 AM, please contact night-coverage at [email or web address], password [facility] 10/18/2012, 9:39 AM  LOS: 1 day

## 2012-10-18 NOTE — Care Management Note (Addendum)
    Page 1 of 1   10/21/2012     2:42:30 PM   CARE MANAGEMENT NOTE 10/21/2012  Patient:  LARNIE, HEART   Account Number:  [id number]  Date Initiated:  10/18/2012  Documentation initiated by:  Lanier Clam  Subjective/Objective Assessment:   ADMITTED W/LUNG MASS.PNA.ZO:XWRU CA,METS BRAIN.     Action/Plan:   FROM HOME W/SPOUSE.HAS PCP,PHARMACY.   Anticipated DC Date:  10/22/2012   Anticipated DC Plan:  HOME W HOME HEALTH SERVICES      DC Planning Services  CM consult      Choice offered to / List presented to:             Status of service:  In process, will continue to follow Medicare Important Message given?   (If response is "NO", the following Medicare IM given date fields will be blank) Date Medicare IM given:   Date Additional Medicare IM given:    Discharge Disposition:    Per UR Regulation:  Reviewed for med. necessity/level of care/duration of stay  If discussed at Long Length of Stay Meetings, dates discussed:    Comments:  10/21/12 Jabez Molner RN,BSN NCM [phone] TRANSFER FROM SDU.PNA.DIFFICULTY SWALLOWING.  10/18/12 Nathaniel Wakeley RN,BSN NCM [phone] PT-HH.PROVIDEDW/HHC AGENCY LIST.

## 2012-10-18 NOTE — Consult Note (Signed)
[facility]    Date of Admission:  10/17/2012  Date of Consult:  10/18/2012  Reason for Consult: Legionella pneumophila Pneumonia Referring Physician: Dr. Elvera Lennox   HPI: Luis Payne is an 72 y.o. male. With PMX significant for adenocarcinoma with brain mets, COPD who is currently receiving chemo and XRT from Cancer center here at [facility] presented chief complaint of worsening cough, night sweats, lack of energy, and weakness and inability to walk because of fatigue and he is "staggering" when walking. He denies any lightheadedness or dizziness. He was found to have RML infiltrate hyponatremia. He was started on broad spectrum abx for HCAP with vancomycin and cefepime and has been feeling better. 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 Pt lives at home where he does have well-water for bathing , washing an drinking. He  Believes there may also be a fountain in cancer center where he is receiving rx otherwise no other specfic water exposures that he or wife can recall.Marland Kitchen He is being initiated on levofloxacin.    Past Medical History  Diagnosis Date  . Substance abuse     TOBACCO  . Lung mass   . 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 Brain metastases 08/18/12    mri-  . HOH (hard of hearing)     wears hearing aids  . COPD (chronic obstructive pulmonary disease)     Past Surgical History  Procedure Laterality Date  . Wrist surgery    . Lung biopsy Left 08/17/12    LUL lung mass-adenocarcinoma  ergies:   No Known Allergies   Medications: I have reviewed patients current medications as documented in Epic Anti-infectives   Start     Dose/Rate Route Frequency Ordered Stop   10/18/12 1600  levofloxacin (LEVAQUIN) IVPB 500 mg     500 mg 100 mL/hr over 60 Minutes Intravenous Every 24 hours 10/18/12 1454     10/18/12 1200  ceFEPIme (MAXIPIME) 1 g in dextrose 5 % 50 mL IVPB  Status:  Discontinued     1 g 100 mL/hr over 30  Minutes Intravenous Every 24 hours 10/17/12 1527 10/18/12 1454   10/18/12 0000  vancomycin (VANCOCIN) 500 mg in sodium chloride 0.9 % 100 mL IVPB  Status:  Discontinued     500 mg 100 mL/hr over 60 Minutes Intravenous Every 12 hours 10/17/12 1140 10/18/12 1454   10/17/12 1145  ceFEPIme (MAXIPIME) 1 g in dextrose 5 % 50 mL IVPB     1 g 100 mL/hr over 30 Minutes Intravenous  Once 10/17/12 1137 10/17/12 1248   10/17/12 1145  vancomycin (VANCOCIN) 500 mg in sodium chloride 0.9 % 100 mL IVPB     500 mg 100 mL/hr over 60 Minutes Intravenous  Once 10/17/12 1139 10/17/12 1437      Social History:  reports that he quit smoking about 2 weeks ago. His smoking use included Cigarettes. He smoked 1.00 pack per day. He has never used smokeless tobacco. He reports that he does not drink alcohol or use illicit drugs.  Family History  Problem Relation Age of Onset  . COPD Father   . Cancer Sister   . Diabetes Brother     As in HPI and primary teams notes otherwise 12 point review of systems is negative  Blood pressure 135/67, pulse 132, temperature 99.8 F (37.7 C), temperature source Oral, resp. rate 18, height 5\' 5"  (1.651 m), weight 122 lb 11.2 oz (  55.656 kg), SpO2 92.00%. General: Alert and awake, oriented x3, not in any acute distress. cachectic HEENT: anicteric sclera, pupils , EOMI, oropharynx clear and without exudate CVS regular rate, normal r,  no murmur rubs or gallops Chest: prolonged exp phase, decreased bs right side Abdomen: soft nontender, nondistended, normal bowel sounds, Extremities: no  clubbing or edema noted bilaterally Skin: no rashes Neuro: nonfocal, strength and sensation intact   Results for orders placed during the hospital encounter of 10/17/12 (from the past 48 hour(s))  CBC WITH DIFFERENTIAL     Status: Abnormal   Collection Time    10/17/12 10:45 AM      Result Value Range   WBC 2.4 (*) 4.0 - 10.5 K/uL   RBC 2.64 (*) 4.22 - 5.81 MIL/uL   Hemoglobin 8.0 (*) 13.0  - 17.0 g/dL   HCT 16.1 (*) 09.6 - 04.5 %   MCV 86.7  78.0 - 100.0 fL   MCH 30.3  26.0 - 34.0 pg   MCHC 34.9  30.0 - 36.0 g/dL   RDW 40.9  81.1 - 91.4 %   Platelets 34 (*) 150 - 400 K/uL   Comment: REPEATED TO VERIFY     SPECIMEN CHECKED FOR CLOTS     PLATELET COUNT CONFIRMED BY SMEAR   Neutrophils Relative % 93 (*) 43 - 77 %   Lymphocytes Relative 4 (*) 12 - 46 %   Monocytes Relative 3  3 - 12 %   Eosinophils Relative 0  0 - 5 %   Basophils Relative 0  0 - 1 %   Neutro Abs 2.2  1.7 - 7.7 K/uL   Lymphs Abs 0.1 (*) 0.7 - 4.0 K/uL   Monocytes Absolute 0.1  0.1 - 1.0 K/uL   Eosinophils Absolute 0.0  0.0 - 0.7 K/uL   Basophils Absolute 0.0  0.0 - 0.1 K/uL   WBC Morphology INCREASED BANDS (>20% BANDS)    COMPREHENSIVE METABOLIC PANEL     Status: Abnormal   Collection Time    10/17/12 10:45 AM      Result Value Range   Sodium 130 (*) 135 - 145 mEq/L   Potassium 3.9  3.5 - 5.1 mEq/L   Chloride 92 (*) 96 - 112 mEq/L   CO2 26  19 - 32 mEq/L   Glucose, Bld 93  70 - 99 mg/dL   BUN 29 (*) 6 - 23 mg/dL   Creatinine, Ser 7.82 (*) 0.50 - 1.35 mg/dL   Calcium 9.5  8.4 - 95.6 mg/dL   Total Protein 6.8  6.0 - 8.3 g/dL   Albumin 2.1 (*) 3.5 - 5.2 g/dL   AST 44 (*) 0 - 37 U/L   ALT 42  0 - 53 U/L   Alkaline Phosphatase 155 (*) 39 - 117 U/L   Total Bilirubin 0.6  0.3 - 1.2 mg/dL   GFR calc non Af Amer 50 (*) >90 mL/min   GFR calc Af Amer 58 (*) >90 mL/min   Comment:            The eGFR has been calculated     using the CKD EPI equation.     This calculation has not been     validated in all clinical     situations.     eGFR's persistently     <90 mL/min signify     possible Chronic Kidney Disease.  TROPONIN I     Status: None   Collection Time    10/17/12 10:45  AM      Result Value Range   Troponin I <0.30  <0.30 ng/mL   Comment:            Due to the release kinetics of cTnI,     a negative result within the first hours     of the onset of symptoms does not rule out      myocardial infarction with certainty.     If myocardial infarction is still suspected,     repeat the test at appropriate intervals.  URINALYSIS, ROUTINE W REFLEX MICROSCOPIC     Status: Abnormal   Collection Time    10/17/12 11:30 AM      Result Value Range   Color, Urine YELLOW  YELLOW   APPearance CLEAR  CLEAR   Specific Gravity, Urine 1.021  1.005 - 1.030   pH 5.0  5.0 - 8.0   Glucose, UA NEGATIVE  NEGATIVE mg/dL   Hgb urine dipstick TRACE (*) NEGATIVE   Bilirubin Urine NEGATIVE  NEGATIVE   Ketones, ur NEGATIVE  NEGATIVE mg/dL   Protein, ur 30 (*) NEGATIVE mg/dL   Urobilinogen, UA 1.0  0.0 - 1.0 mg/dL   Nitrite NEGATIVE  NEGATIVE   Leukocytes, UA NEGATIVE  NEGATIVE  URINE MICROSCOPIC-ADD ON     Status: None   Collection Time    10/17/12 11:30 AM      Result Value Range   WBC, UA 0-2  <3 WBC/hpf  TYPE AND SCREEN     Status: None   Collection Time    10/17/12 11:56 AM      Result Value Range   ABO/RH(D) A POS     Antibody Screen NEG     Sample Expiration 10/20/2012     Unit Number [id number]     Blood Component Type RED CELLS,LR     Unit division 00     Status of Unit ALLOCATED     Transfusion Status OK TO TRANSFUSE     Crossmatch Result Compatible     Unit Number [id number]     Blood Component Type RED CELLS,LR     Unit division 00     Status of Unit ISSUED,FINAL     Transfusion Status OK TO TRANSFUSE     Crossmatch Result Compatible    HIV ANTIBODY (ROUTINE TESTING)     Status: None   Collection Time    10/17/12 11:56 AM      Result Value Range   HIV NON REACTIVE  NON REACTIVE  ABO/RH     Status: None   Collection Time    10/17/12 11:56 AM      Result Value Range   ABO/RH(D) A POS    PREPARE RBC (CROSSMATCH)     Status: None   Collection Time    10/17/12 12:00 PM      Result Value Range   Order Confirmation ORDER PROCESSED BY BLOOD BANK    CULTURE, BLOOD (ROUTINE X 2)     Status: None   Collection Time    10/17/12 12:40 PM      Result Value Range    Specimen Description BLOOD LEFT ARM  10 ML IN [facility] BOTTLE     Special Requests Immunocompromised TAKEN AFTER 20 ML OF ANTIBIOTICS     Culture  Setup Time 10/17/2012 20:04     Culture       Value:        BLOOD CULTURE RECEIVED NO GROWTH TO DATE CULTURE WILL BE HELD FOR  5 DAYS BEFORE ISSUING A FINAL NEGATIVE REPORT   Report Status PENDING    CULTURE, BLOOD (ROUTINE X 2)     Status: None   Collection Time    10/17/12 12:50 PM      Result Value Range   Specimen Description BLOOD RIGHT FOREARM  5.5 ML IN [facility] BOTTLE     Special Requests Immunocompromised TAKEN AFTER 20 ML OF ANTIBIOTICS     Culture  Setup Time 10/17/2012 20:04     Culture       Value:        BLOOD CULTURE RECEIVED NO GROWTH TO DATE CULTURE WILL BE HELD FOR 5 DAYS BEFORE ISSUING A FINAL NEGATIVE REPORT   Report Status PENDING    LACTIC ACID, PLASMA     Status: Abnormal   Collection Time    10/17/12  1:47 PM      Result Value Range   Lactic Acid, Venous 2.4 (*) 0.5 - 2.2 mmol/L  LEGIONELLA ANTIGEN, URINE     Status: None   Collection Time    10/17/12  7:10 PM      Result Value Range   Specimen Description URINE, CLEAN CATCH     Special Requests NONE     Legionella Antigen, Urine Positive for Legionella pneumophilia serogroup 1     Report Status PENDING    STREP PNEUMONIAE URINARY ANTIGEN     Status: None   Collection Time    10/17/12  7:10 PM      Result Value Range   Strep Pneumo Urinary Antigen NEGATIVE  NEGATIVE   Comment: Performed at [facility]                Infection due to S. pneumoniae     cannot be absolutely ruled out     since the antigen present     may be below the detection limit     of the test.  COMPREHENSIVE METABOLIC PANEL     Status: Abnormal   Collection Time    10/18/12  5:00 AM      Result Value Range   Sodium 132 (*) 135 - 145 mEq/L   Potassium 3.3 (*) 3.5 - 5.1 mEq/L   Chloride 99  96 - 112 mEq/L   CO2 24  19 - 32 mEq/L   Glucose, Bld 109 (*) 70 - 99 mg/dL   BUN 31 (*) 6  - 23 mg/dL   Creatinine, Ser 1.61  0.50 - 1.35 mg/dL   Calcium 8.7  8.4 - 09.6 mg/dL   Total Protein 5.5 (*) 6.0 - 8.3 g/dL   Albumin 1.6 (*) 3.5 - 5.2 g/dL   AST 40 (*) 0 - 37 U/L   ALT 34  0 - 53 U/L   Alkaline Phosphatase 128 (*) 39 - 117 U/L   Total Bilirubin 0.9  0.3 - 1.2 mg/dL   GFR calc non Af Amer 57 (*) >90 mL/min   GFR calc Af Amer 66 (*) >90 mL/min   Comment:            The eGFR has been calculated     using the CKD EPI equation.     This calculation has not been     validated in all clinical     situations.     eGFR's persistently     <90 mL/min signify     possible Chronic Kidney Disease.  MAGNESIUM     Status: None   Collection Time  10/18/12  5:00 AM      Result Value Range   Magnesium 1.9  1.5 - 2.5 mg/dL  CBC WITH DIFFERENTIAL     Status: Abnormal   Collection Time    10/18/12  5:00 AM      Result Value Range   WBC 2.4 (*) 4.0 - 10.5 K/uL   RBC 2.62 (*) 4.22 - 5.81 MIL/uL   Hemoglobin 7.8 (*) 13.0 - 17.0 g/dL   HCT 16.1 (*) 09.6 - 04.5 %   MCV 85.5  78.0 - 100.0 fL   MCH 29.8  26.0 - 34.0 pg   MCHC 34.8  30.0 - 36.0 g/dL   RDW 40.9  81.1 - 91.4 %   Platelets 46 (*) 150 - 400 K/uL   Comment: DELTA CHECK NOTED   Neutrophils Relative % 90 (*) 43 - 77 %   Lymphocytes Relative 4 (*) 12 - 46 %   Monocytes Relative 6  3 - 12 %   Eosinophils Relative 0  0 - 5 %   Basophils Relative 0  0 - 1 %   Neutro Abs 2.2  1.7 - 7.7 K/uL   Lymphs Abs 0.1 (*) 0.7 - 4.0 K/uL   Monocytes Absolute 0.1  0.1 - 1.0 K/uL   Eosinophils Absolute 0.0  0.0 - 0.7 K/uL   Basophils Absolute 0.0  0.0 - 0.1 K/uL   RBC Morphology POLYCHROMASIA PRESENT     WBC Morphology INCREASED BANDS (>20% BANDS)     Comment: DOHLE BODIES      Component Value Date/Time   SDES URINE, CLEAN CATCH 10/17/2012 1910   SPECREQUEST NONE 10/17/2012 1910   CULT        BLOOD CULTURE RECEIVED NO GROWTH TO DATE CULTURE WILL BE HELD FOR 5 DAYS BEFORE ISSUING A FINAL NEGATIVE REPORT 10/17/2012 1250   REPTSTATUS  PENDING 10/17/2012 1910   Dg Chest Portable 1 View  10/17/2012   *RADIOLOGY REPORT*  Clinical Data: History of lung cancer, short of breath and increasing cough  PORTABLE CHEST - 1 VIEW  Comparison: Most recent prior PET CT 08/02/2012; prior chest x-ray 07/23/2012  Findings: New irregular airspace opacity in the right middle lobe obscuring the right heart border.  Left upper lobe pulmonary mass again noted appears slightly decreased in size, query interval response to therapy.  Background emphysema and COPD.  Cardiac and mediastinal contours are unchanged.  No pneumothorax.  IMPRESSION:  1.  New patchy airspace disease in the right middle lobe concerning for pneumonia or aspiration.  Metastatic disease would be unlikely and the relatively short interval since the prior cross-sectional imaging. 2.  The left apical pulmonary mass is slightly smaller in appearance compared to prior which may reflect an interval response to therapy. 3.  Chronic background lung disease including emphysema and COPD.   Original Report Authenticated By: Malachy Moan, M.D.     Recent Results (from the past 720 hour(s))  CULTURE, BLOOD (ROUTINE X 2)     Status: None   Collection Time    10/17/12 12:40 PM      Result Value Range Status   Specimen Description BLOOD LEFT ARM  10 ML IN [facility] BOTTLE   Final   Special Requests Immunocompromised TAKEN AFTER 20 ML OF ANTIBIOTICS   Final   Culture  Setup Time 10/17/2012 20:04   Final   Culture     Final   Value:        BLOOD CULTURE RECEIVED NO GROWTH TO DATE  CULTURE WILL BE HELD FOR 5 DAYS BEFORE ISSUING A FINAL NEGATIVE REPORT   Report Status PENDING   Incomplete  CULTURE, BLOOD (ROUTINE X 2)     Status: None   Collection Time    10/17/12 12:50 PM      Result Value Range Status   Specimen Description BLOOD RIGHT FOREARM  5.5 ML IN [facility] BOTTLE   Final   Special Requests Immunocompromised TAKEN AFTER 20 ML OF ANTIBIOTICS   Final   Culture  Setup Time 10/17/2012 20:04   Final    Culture     Final   Value:        BLOOD CULTURE RECEIVED NO GROWTH TO DATE CULTURE WILL BE HELD FOR 5 DAYS BEFORE ISSUING A FINAL NEGATIVE REPORT   Report Status PENDING   Incomplete     Impression/Recommendation  72 year old with metastatic lung cancer on chemotherapy and XRT with Legionella PNA  #1 Legionella PNA: --agree with Levaquin and would treat for 10 days --will ask IP to investigate if there ar any other pts with this infection recently admitted to [facility], [facility] who share any of his exposure history --[facility] health dept should be automatically notified by lab --would be wise to have pt look into having his well water tested  #2 Screening: check hiv and hep c    Thank you so much for this interesting consult  [facility] [phone] (pager) [phone] (office) 10/18/2012, 5:01 PM  Paulette Blanch Dam 10/18/2012, 5:01 PM

## 2012-10-18 NOTE — Progress Notes (Signed)
Urine (+) Legionella pneumophilia Sero Grp 1. MD Paged and made aware. Called  ICP, Patrcia Dolly made aware.

## 2012-10-18 NOTE — Evaluation (Signed)
Physical Therapy Evaluation Patient Details Name: Luis Payne MRN: [id number] DOB: 1941-04-22 Today's Date: 10/18/2012 Time: 4098-1191 PT Time Calculation (min): 29 min  PT Assessment / Plan / Recommendation Clinical Impression  72 yo male admitted 10/17/17 with weakness,sob, recently on chemo. H/O lung cancer w/ mets to the brain. Pt lives with wife. Pt on 3 l O2, walked x 60 ft on RA with sats dropping to 75%, replaced 3 L and return to >91%. HR 125. RN aware. Pt will benefit from PT while in acute care. Pt most like willl require O2. Recommend HHPT.    PT Assessment  Patient needs continued PT services    Follow Up Recommendations  Home health PT    Does the patient have the potential to tolerate intense rehabilitation      Barriers to Discharge        Equipment Recommendations  None recommended by PT    Recommendations for Other Services     Frequency Min 3X/week    Precautions / Restrictions Precautions Precautions: Fall Precaution Comments: needs O2   Pertinent Vitals/Pain SATURATION QUALIFICATIONS: (This note is used to comply with regulatory documentation for home oxygen)  Patient Saturations on Room Air at Rest = 93% Patient Saturations on Room Air while Ambulating = 75%  Patient Saturations on 3 Liters of oxygen while Ambulating = 95%  Please briefly explain why patient needs home oxygen:Pt's sats dropped significantly after ambulating 60' on RA., dyspnea 3/4 , HR incr. To 125         Mobility  Bed Mobility Bed Mobility: Supine to Sit Supine to Sit: 7: Independent Transfers Transfers: Stand to Sit;Sit to Stand Sit to Stand: 4: Min guard;From bed;With upper extremity assist Stand to Sit: To chair/3-in-1;4: Min guard;With upper extremity assist Ambulation/Gait Ambulation/Gait Assistance: 4: Min guard Ambulation Distance (Feet): 60 Feet Assistive device: Rolling walker Gait velocity: encouraged pursed lip breaths, sats dropped to 75 on RA,    Exercises      PT Diagnosis: Generalized weakness  PT Problem List: Decreased strength;Decreased activity tolerance;Decreased mobility;Cardiopulmonary status limiting activity;Decreased safety awareness;Decreased knowledge of precautions PT Treatment Interventions: DME instruction;Gait training;Stair training;Functional mobility training;Therapeutic activities;Therapeutic exercise;Patient/family education   PT Goals Acute Rehab PT Goals PT Goal Formulation: With patient/family Time For Goal Achievement: 11/01/12 Potential to Achieve Goals: Good Pt will go Sit to Stand: with modified independence PT Goal: Sit to Stand - Progress: Goal set today Pt will go Stand to Sit: with modified independence PT Goal: Stand to Sit - Progress: Goal set today Pt will Ambulate: 51 - 150 feet;with modified independence PT Goal: Ambulate - Progress: Goal set today Pt will Go Up / Down Stairs: 3-5 stairs;with min assist PT Goal: Up/Down Stairs - Progress: Goal set today Pt will Perform Home Exercise Program: Independently PT Goal: Perform Home Exercise Program - Progress: Goal set today  Visit Information  Last PT Received On: 10/18/12 Assistance Needed: +1    Subjective Data  Subjective: I just want to sleep all day Patient Stated Goal: to go home   Prior Functioning  Home Living Lives With: Spouse Type of Home: House Home Access: Stairs to enter Secretary/administrator of Steps: 3 Entrance Stairs-Rails: None Home Layout: One level Firefighter: Standard Home Adaptive Equipment: Walker - rolling Prior Function Level of Independence: Independent Able to Take Stairs?: Yes Communication Communication: No difficulties    Cognition  Cognition Arousal/Alertness: Awake/alert Behavior During Therapy: WFL for tasks assessed/performed Overall Cognitive Status: Within Functional Limits for tasks  assessed    Extremity/Trunk Assessment Right Upper Extremity Assessment RUE ROM/Strength/Tone: [facility] for tasks  assessed Left Upper Extremity Assessment LUE ROM/Strength/Tone: WFL for tasks assessed LUE Sensation: WFL - Light Touch Right Lower Extremity Assessment RLE ROM/Strength/Tone: WFL for tasks assessed Left Lower Extremity Assessment LLE ROM/Strength/Tone: WFL for tasks assessed LLE Sensation: WFL - Light Touch Trunk Assessment Trunk Assessment: Normal   Balance Balance Balance Assessed: Yes Static Standing Balance Static Standing - Balance Support: No upper extremity supported Static Standing - Level of Assistance: 5: Stand by assistance  End of Session PT - End of Session Activity Tolerance: Treatment limited secondary to medical complications (Comment) Patient left: in chair;with call bell/phone within reach  GP     Rada Hay 10/18/2012, 9:54 AM

## 2012-10-19 ENCOUNTER — Inpatient Hospital Stay (HOSPITAL_COMMUNITY): Payer: Medicare Other

## 2012-10-19 ENCOUNTER — Other Ambulatory Visit: Payer: Medicare Other

## 2012-10-19 DIAGNOSIS — C7931 Secondary malignant neoplasm of brain: Secondary | ICD-10-CM

## 2012-10-19 DIAGNOSIS — C801 Malignant (primary) neoplasm, unspecified: Secondary | ICD-10-CM

## 2012-10-19 DIAGNOSIS — A481 Legionnaires' disease: Secondary | ICD-10-CM

## 2012-10-19 DIAGNOSIS — J9601 Acute respiratory failure with hypoxia: Secondary | ICD-10-CM | POA: Diagnosis not present

## 2012-10-19 DIAGNOSIS — J189 Pneumonia, unspecified organism: Secondary | ICD-10-CM

## 2012-10-19 DIAGNOSIS — J9600 Acute respiratory failure, unspecified whether with hypoxia or hypercapnia: Secondary | ICD-10-CM

## 2012-10-19 DIAGNOSIS — I359 Nonrheumatic aortic valve disorder, unspecified: Secondary | ICD-10-CM

## 2012-10-19 DIAGNOSIS — D61818 Other pancytopenia: Secondary | ICD-10-CM

## 2012-10-19 DIAGNOSIS — R0989 Other specified symptoms and signs involving the circulatory and respiratory systems: Secondary | ICD-10-CM

## 2012-10-19 DIAGNOSIS — D649 Anemia, unspecified: Secondary | ICD-10-CM

## 2012-10-19 DIAGNOSIS — R0609 Other forms of dyspnea: Secondary | ICD-10-CM

## 2012-10-19 LAB — BLOOD GAS, ARTERIAL
Acid-base deficit: 2.3 mmol/L — ABNORMAL HIGH (ref 0.0–2.0)
Drawn by: 257701
O2 Content: 3 L/min
pCO2 arterial: 33.8 mmHg — ABNORMAL LOW (ref 35.0–45.0)
pH, Arterial: 7.414 (ref 7.350–7.450)
pO2, Arterial: 60.7 mmHg — ABNORMAL LOW (ref 80.0–100.0)

## 2012-10-19 LAB — BASIC METABOLIC PANEL
BUN: 30 mg/dL — ABNORMAL HIGH (ref 6–23)
Chloride: 102 mEq/L (ref 96–112)
Creatinine, Ser: 1 mg/dL (ref 0.50–1.35)
GFR calc Af Amer: 84 mL/min — ABNORMAL LOW (ref >90)
Glucose, Bld: 106 mg/dL — ABNORMAL HIGH (ref 70–99)

## 2012-10-19 LAB — CBC
HCT: 22.5 % — ABNORMAL LOW (ref 39.0–52.0)
Hemoglobin: 8 g/dL — ABNORMAL LOW (ref 13.0–17.0)
MCHC: 35.6 g/dL (ref 30.0–36.0)
MCV: 87.2 fL (ref 78.0–100.0)
RDW: 15.5 % (ref 11.5–15.5)
WBC: 4.7 10*3/uL (ref 4.0–10.5)

## 2012-10-19 MED ORDER — FUROSEMIDE 10 MG/ML IJ SOLN
20.0000 mg | Freq: Once | INTRAMUSCULAR | Status: AC
  Administered 2012-10-19: 20 mg via INTRAVENOUS
  Filled 2012-10-19: qty 2

## 2012-10-19 MED ORDER — IPRATROPIUM BROMIDE 0.02 % IN SOLN
0.5000 mg | Freq: Four times a day (QID) | RESPIRATORY_TRACT | Status: DC
  Administered 2012-10-19 – 2012-10-22 (×12): 0.5 mg via RESPIRATORY_TRACT
  Filled 2012-10-19 (×13): qty 2.5

## 2012-10-19 MED ORDER — LEVALBUTEROL HCL 0.63 MG/3ML IN NEBU: 0.6300 mg | INHALATION_SOLUTION | RESPIRATORY_TRACT | Status: DC | PRN

## 2012-10-19 MED ORDER — RIFAMPIN 300 MG PO CAPS
300.0000 mg | ORAL_CAPSULE | Freq: Two times a day (BID) | ORAL | Status: DC
  Administered 2012-10-19 – 2012-10-22 (×7): 300 mg via ORAL
  Filled 2012-10-19 (×9): qty 1

## 2012-10-19 MED ORDER — LEVALBUTEROL HCL 0.63 MG/3ML IN NEBU
0.6300 mg | INHALATION_SOLUTION | Freq: Four times a day (QID) | RESPIRATORY_TRACT | Status: DC
  Administered 2012-10-19 – 2012-10-22 (×12): 0.63 mg via RESPIRATORY_TRACT
  Filled 2012-10-19 (×22): qty 3

## 2012-10-19 MED ORDER — SODIUM CHLORIDE 0.9 % IV BOLUS (SEPSIS): 1000.0000 mL | Freq: Once | INTRAVENOUS | Status: DC

## 2012-10-19 NOTE — Progress Notes (Signed)
  Echocardiogram 2D Echocardiogram has been performed.  Cathie Beams 10/19/2012, 12:49 PM

## 2012-10-19 NOTE — Progress Notes (Signed)
Patient woke up complaining of his back burning from radiation. The top half of the patient's back is warm to the touch and reddened. RN put the radiaplexrx on his back and patient stated he felt relief.

## 2012-10-19 NOTE — Progress Notes (Signed)
[facility]  Day #2 levaquin  Subjective: Increasing dyspnea, tachycardia, being transferred to SDU and CCM consulted   Antibiotics:  Anti-infectives   Start     Dose/Rate Route Frequency Ordered Stop   10/18/12 1600  levofloxacin (LEVAQUIN) IVPB 500 mg     500 mg 100 mL/hr over 60 Minutes Intravenous Every 24 hours 10/18/12 1454     10/18/12 1200  ceFEPIme (MAXIPIME) 1 g in dextrose 5 % 50 mL IVPB  Status:  Discontinued     1 g 100 mL/hr over 30 Minutes Intravenous Every 24 hours 10/17/12 1527 10/18/12 1454   10/18/12 0000  vancomycin (VANCOCIN) 500 mg in sodium chloride 0.9 % 100 mL IVPB  Status:  Discontinued     500 mg 100 mL/hr over 60 Minutes Intravenous Every 12 hours 10/17/12 1140 10/18/12 1454   10/17/12 1145  ceFEPIme (MAXIPIME) 1 g in dextrose 5 % 50 mL IVPB     1 g 100 mL/hr over 30 Minutes Intravenous  Once 10/17/12 1137 10/17/12 1248   10/17/12 1145  vancomycin (VANCOCIN) 500 mg in sodium chloride 0.9 % 100 mL IVPB     500 mg 100 mL/hr over 60 Minutes Intravenous  Once 10/17/12 1139 10/17/12 1437      Medications: Scheduled Meds: . feeding supplement  237 mL Oral BID BM  . feeding supplement  1 Container Oral Q24H  . FLUoxetine  20 mg Oral Daily  . folic acid  1 mg Oral Daily  . ipratropium  0.5 mg Nebulization Q6H  . levalbuterol  0.63 mg Nebulization Q6H  . levofloxacin (LEVAQUIN) IV  500 mg Intravenous Q24H  . multivitamin with minerals  1 tablet Oral q morning - 10a  . pantoprazole  40 mg Oral Daily  . sucralfate  1 g Oral TID WC & HS   Continuous Infusions:  PRN Meds:.acetaminophen, chlorproMAZINE, hyaluronate sodium, levalbuterol, metoprolol, oxyCODONE-acetaminophen   Objective: Weight change:   Intake/Output Summary (Last 24 hours) at 10/19/12 1308 Last data filed at 10/19/12 1131  Gross per 24 hour  Intake 4997.5 ml  Output   1635 ml  Net 3362.5 ml   Blood pressure 137/70, pulse 131, temperature 98.3 F (36.8  C), temperature source Oral, resp. rate 18, height 5\' 5"  (1.651 m), weight 122 lb 11.2 oz (55.656 kg), SpO2 90.00%. Temp:  [97.5 F (36.4 C)-100.1 F (37.8 C)] 98.3 F (36.8 C) (06/10 1124) Pulse Rate:  [106-132] 131 (06/10 1124) Resp:  [18-22] 18 (06/10 1124) BP: (95-137)/(50-71) 137/70 mmHg (06/10 1124) SpO2:  [90 %-94 %] 90 % (06/10 1124)  Physical Exam: General: Alert and awake, oriented x3,   Receiving TTE study Neuro: nonfocal   Lab Results:  Recent Labs  10/18/12 0500 10/19/12 0440  WBC 2.4* 4.7  HGB 7.8* 8.0*  HCT 22.4* 22.5*  PLT 46* 89*    BMET  Recent Labs  10/18/12 0500 10/19/12 0440  NA 132* 133*  K 3.3* 3.6  CL 99 102  CO2 24 24  GLUCOSE 109* 106*  BUN 31* 30*  CREATININE 1.22 1.00  CALCIUM 8.7 8.9    Micro Results: Recent Results (from the past 240 hour(s))  CULTURE, BLOOD (ROUTINE X 2)     Status: None   Collection Time    10/17/12 12:40 PM      Result Value Range Status   Specimen Description BLOOD LEFT ARM  10 ML IN [facility] BOTTLE   Final   Special Requests Immunocompromised TAKEN  AFTER 20 ML OF ANTIBIOTICS   Final   Culture  Setup Time 10/17/2012 20:04   Final   Culture     Final   Value:        BLOOD CULTURE RECEIVED NO GROWTH TO DATE CULTURE WILL BE HELD FOR 5 DAYS BEFORE ISSUING A FINAL NEGATIVE REPORT   Report Status PENDING   Incomplete  CULTURE, BLOOD (ROUTINE X 2)     Status: None   Collection Time    10/17/12 12:50 PM      Result Value Range Status   Specimen Description BLOOD RIGHT FOREARM  5.5 ML IN [facility] BOTTLE   Final   Special Requests Immunocompromised TAKEN AFTER 20 ML OF ANTIBIOTICS   Final   Culture  Setup Time 10/17/2012 20:04   Final   Culture     Final   Value:        BLOOD CULTURE RECEIVED NO GROWTH TO DATE CULTURE WILL BE HELD FOR 5 DAYS BEFORE ISSUING A FINAL NEGATIVE REPORT   Report Status PENDING   Incomplete    Studies/Results: Dg Chest 1 View  10/19/2012   *RADIOLOGY REPORT*  Clinical Data: Shortness of  breath, weakness, COPD, smoker, history lung cancer  CHEST - 1 VIEW  Comparison: Portable exam 0906 hours compared to 10/17/2012  Findings: Upper normal heart size. Increased infiltrates identified in right lung diffusely. Underlying emphysematous changes and chronic bronchitis. Persistent left upper lobe opacity compatible with known mass. Minimal atelectasis at left base Small right pleural effusion. No pneumothorax.  IMPRESSION: Persistent left upper lobe mass. Changes of COPD with bibasilar atelectasis, small right pleural effusion, and increase in right lung infiltrates.   Original Report Authenticated By: Ulyses Southward, M.D.      Assessment/Plan: Luis Payne is a 72 y.o. male with  metastatic lung cancer on chemotherapy and XRT with severe Legionella PNA    #1 Legionella PNA:  --agree with Levaquin and would treat for at least  14 days (can change to po tomorrow) --could add rifampin though not shown to improve mortality or morbidity --if he will require being on protracted steroids one could consider even extending this further --Pt has been using his well water in his nebulizer treatments and I have asked him to ONLY USE STERILE SALINE for nebulizer treatments  --IP have so far found not  exposure history common with this pt and others in [facility] who have been dx with Legionella recently.   --[facility] dept should be automatically notified by lab  --would be wise to have pt look into having his well water tested   #2 Screening: check hiv and hep c  #3 Pancytopenia: due to severe infection  #4 Respiratory distress: CCM thinks much of this is due to volume excess, he is going to step down.    LOS: 2 days   Acey Lav 10/19/2012, 1:08 PM

## 2012-10-19 NOTE — Consult Note (Signed)
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 Kathrynn Running  He is under Systemic chemotherapy with carboplatin and Alimta every 3 weeks, status post 2 cycles. First dose was given on May, 05, 2014.    He has had 25 rtx treatments and his second chemotherapy 1 week ago. He has had increased weakness, low energy levels, non productive cough.  Admitted on 6-8 with pneumonia that was from legionella. ID is following him and he is on levaquin. 6-10 he has increased heart rate 130's, tachyphemic with adequate blood pressure. He is a positive 3 litre's over 24 hours and diuresis has been ordered. He is being transferred to SDU and PCCM asked to assist in his care. He is a full code by records.   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 He has had 25 rtx treatments and his second chemotherapy 1 week ago. He has had increased weakness, low energy levels, non productive cough.  Admitted on 6-8 with pneumonia that was from legionella. ID is following him and he is on levaquin. 6-10 he has increased heart rate 130's, tachyphemic with adequate blood pressure. He is a positive 3 litre's over 24 hours and diuresis  has been ordered. He is being transferred to SDU and PCCM asked to assist in his care. He is a full code by records.  PAST MEDICAL HISTORY :  Past Medical History  Diagnosis Date  . Substance abuse     TOBACCO  . Lung mass   . 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 Brain metastases 08/18/12    mri-  . HOH (hard of hearing)     wears hearing aids  . COPD (chronic obstructive pulmonary disease)    Past Surgical History  Procedure Laterality Date  . Wrist surgery    . Lung biopsy Left 08/17/12    LUL lung mass-adenocarcinoma   Prior to Admission medications   Medication Sig Start Date End Date Taking? Authorizing Provider  Ascorbic Acid (VITAMIN C) 500 MG CAPS Take 1 capsule by mouth every morning.   Yes Historical Provider, MD  chlorproMAZINE (THORAZINE) 25 MG tablet Take 25 mg by mouth 3 (three) times daily as needed (for hiccups).   Yes Historical Provider, MD  cholecalciferol (VITAMIN D) 1000 UNITS tablet Take 1,000 Units by mouth every morning.   Yes Historical Provider, MD  dexamethasone (DECADRON) 4 MG tablet Take 4 mg by mouth 2 (two) times daily with a meal. Day before, day of and day after chemo.   Yes Historical Provider, MD  FLUoxetine (PROZAC) 20 MG capsule Take 1 capsule (20 mg total) by  mouth daily. 09/27/12  Yes Oneita Hurt, MD  folic acid (FOLVITE) 1 MG tablet Take 1 tablet (1 mg total) by mouth daily. 09/07/12  Yes Si Gaul, MD  hyaluronate sodium (RADIAPLEXRX) GEL Apply 1 application topically 2 (two) times daily as needed (for radiation burns).  09/10/12  Yes Oneita Hurt, MD  Multiple Vitamin (MULTIVITAMIN WITH MINERALS) TABS Take 1 tablet by mouth every morning.   Yes Historical Provider, MD  omeprazole (PRILOSEC) 20 MG capsule Take 20 mg by mouth daily.   Yes Historical Provider, MD  oxyCODONE-acetaminophen (PERCOCET/ROXICET) 5-325 MG per tablet Take 1 tablet by mouth every 6 (six) hours as needed for pain. 09/07/12  Yes Si Gaul, MD   PRESCRIPTION MEDICATION Inject into the vein every 21 ( twenty-one) days. Alimta and Paraplatin infusion q21d.   Yes Historical Provider, MD  sucralfate (CARAFATE) 1 G tablet Take 1 tablet (1 g total) by mouth 4 (four) times daily. 5 minutes before meals 10/13/12  Yes Oneita Hurt, MD   No Known Allergies  FAMILY HISTORY:  Family History  Problem Relation Age of Onset  . COPD Father   . Cancer Sister   . Diabetes Brother    SOCIAL HISTORY:  reports that he quit smoking about 3 weeks ago. His smoking use included Cigarettes. He smoked 1.00 pack per day. He has never used smokeless tobacco. He reports that he does not drink alcohol or use illicit drugs.  REVIEW OF SYSTEMS:   10 point review of system taken, please see HPI for positives and negatives. c/o dyspnea Cough - minimal sputum  SUBJECTIVE:   VITAL SIGNS: Temp:  [97.5 F (36.4 C)-100.1 F (37.8 C)] 98.3 F (36.8 C) (06/10 1124) Pulse Rate:  [106-132] 131 (06/10 1124) Resp:  [18-22] 18 (06/10 1124) BP: (95-137)/(50-71) 137/70 mmHg (06/10 1124) SpO2:  [90 %-94 %] 90 % (06/10 1124) HEMODYNAMICS:   VENTILATOR SETTINGS:   INTAKE / OUTPUT: Intake/Output     06/09 0701 - 06/10 0700 06/10 0701 - 06/11 0700   P.O. 1160    I.V. (mL/kg) 3097.5 (55.7)    IV Piggyback 1100    Total Intake(mL/kg) 5357.5 (96.3)    Urine (mL/kg/hr) 1185 (0.9) 450 (1.6)   Total Output 1185 450   Net +4172.5 -450        Urine Occurrence 1 x    Stool Occurrence 1 x      PHYSICAL EXAMINATION: General:  Emaciated wm in no acute distress Neuro:  Intact but strange affect HEENT:  No LAN Cardiovascular:  HSR RRR st Lungs:  Coarse rhonchi l>r Abdomen:  +bs Musculoskeletal:  intact Skin:  warm  LABS:  Recent Labs Lab 10/17/12 1045 10/17/12 1347 10/18/12 0500 10/19/12 0440 10/19/12 1042  HGB 8.0*  --  7.8* 8.0*  --   WBC 2.4*  --  2.4* 4.7  --   PLT 34*  --  46* 89*  --   NA 130*  --  132* 133*  --   K 3.9  --  3.3* 3.6  --    CL 92*  --  99 102  --   CO2 26  --  24 24  --   GLUCOSE 93  --  109* 106*  --   BUN 29*  --  31* 30*  --   CREATININE 1.36*  --  1.22 1.00  --   CALCIUM 9.5  --  8.7 8.9  --   MG  --   --  1.9  --   --   AST 44*  --  40*  --   --   ALT 42  --  34  --   --   ALKPHOS 155*  --  128*  --   --   BILITOT 0.6  --  0.9  --   --   PROT 6.8  --  5.5*  --   --   ALBUMIN 2.1*  --  1.6*  --   --   LATICACIDVEN  --  2.4*  --   --   --   TROPONINI <0.30  --   --   --   --   PHART  --   --   --   --  7.414  PCO2ART  --   --   --   --  33.8*  PO2ART  --   --   --   --  60.7*   No results found for this basename: GLUCAP,  in the last 168 hours  Imaging: Dg Chest 1 View  10/19/2012   *RADIOLOGY REPORT*  Clinical Data: Shortness of breath, weakness, COPD, smoker, history lung cancer  CHEST - 1 VIEW  Comparison: Portable exam 0906 hours compared to 10/17/2012  Findings: Upper normal heart size. Increased infiltrates identified in right lung diffusely. Underlying emphysematous changes and chronic bronchitis. Persistent left upper lobe opacity compatible with known mass. Minimal atelectasis at left base Small right pleural effusion. No pneumothorax.  IMPRESSION: Persistent left upper lobe mass. Changes of COPD with bibasilar atelectasis, small right pleural effusion, and increase in right lung infiltrates.   Original Report Authenticated By: Ulyses Southward, M.D.     ASSESSMENT / PLAN:  PULMONARY A:Acute Hypoxic resp failure     COPD    Lung cancer with brainmets    Legionella Pna P:   -O2 as needed -Add bd's(xopenex due to st.  CARDIOVASCULAR A: Sinus tachycardia with adequate bp P:  -Attempt diuresis  -Minimise BB use esp if bronchospasm   RENAL Lab Results  Component Value Date   CREATININE 1.00 10/19/2012   CREATININE 1.22 10/18/2012   CREATININE 1.36* 10/17/2012   CREATININE 1.0 10/11/2012   CREATININE 1.1 10/05/2012   CREATININE 1.0 09/28/2012    A: No acute issue     Lactic acidosis P:    -Monitor creatine -monitor lactic acid GASTROINTESTINAL A:  GI protection P:   -PPI  HEMATOLOGIC A:  Anemia, thrombocytopenia P:  -transfuse as needed for hgb <7.0  INFECTIOUS A:  Legionella pna P:   -per ID see flows  ENDOCRINE A:  Mild hyponatremia  -related to pna P:   -replete lytes as needed NEUROLOGIC A:  Brain mets post stereotatic radiotherapy x 2 lesions.  P:   Monitor neuro status   TODAY'S SUMMARY:  Metastatic lung CA  Admitted on 6-8 with pneumonia that was from legionella. Now with increasing hypoxia , improving pancytopenia- ? Worsening pna vs fluid overload Was DNR but full code now since reversible process   Brett Canales Minor ACNP [facility] PCCM Pager [phone] till 3 pm If no answer page [phone]  Independently examined pt, evaluated data & formulated above care plan with NP  [facility] V.  10/19/2012, 12:27 PM

## 2012-10-19 NOTE — Progress Notes (Signed)
[facility] PROGRESS NOTE  LEN AZEEZ MVH:[id number] DOB: 1941-04-14 DOA: 10/17/2012 PCP: Cassell Smiles., MD  HPI: Luis Payne is a 72 y.o. 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 Found to have PNA on CXR, and urine Legionella antigen came back positive.  Assessment/Plan: Sepsis due to legionella pneumonia - febrile and tachycardic overnight, started on Levofloxacin yesterday and continue that. ID following and appreciate their input. Patient continues to be tachycardic, hypotensive overnight requiring IV fluids, and showing signs of fluid overload on chest x-ray, and clinical exam. His blood pressure is better this morning, we'll decrease the fluid rate. Continue to closely monitor his heart rate and vital signs, will obtain an ABG this morning. I talked to the patient regarding his CODE STATUS, and this is an acute infectious process which is potentially reversible, and he opted that he would be okay with intubation if we get to that point.  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 Pancytopenia  Likely due to chronic illness as well as ongoing chemotherapy. No active bleeding, continue to monitor. Stable today. Skin rash - Very mild hyperpigmentation, due to radiation therapy. Continue home topical gel.  Acute kidney injury - improving Weakness - Likely due to acute illness Hyponatremia - improving Tobacco abuse - Patient smoked for  about 60 years. Quit 2 weeks ago. DVT prophylaxis - SCDs given thrombocytopenia  Code Status: Full Family Communication: wife bedside  Disposition Plan: home when medically stable  Consultants:  Dr. Arbutus Ped as primary oncologist  Procedures:  none  Antibiotics:  Anti-infectives   Start     Dose/Rate Route Frequency Ordered Stop   10/18/12 1600  levofloxacin (LEVAQUIN) IVPB 500 mg     500 mg 100 mL/hr over 60 Minutes Intravenous Every 24 hours 10/18/12 1454     10/18/12 1200  ceFEPIme (MAXIPIME) 1 g in dextrose 5 % 50 mL IVPB  Status:  Discontinued     1 g 100 mL/hr over 30 Minutes Intravenous Every 24 hours 10/17/12 1527 10/18/12 1454   10/18/12 0000  vancomycin (VANCOCIN) 500 mg in sodium chloride 0.9 % 100 mL IVPB  Status:  Discontinued     500 mg 100 mL/hr over 60 Minutes Intravenous Every 12 hours 10/17/12 1140 10/18/12 1454   10/17/12 1145  ceFEPIme (MAXIPIME) 1 g in dextrose 5 % 50 mL IVPB     1 g 100 mL/hr over 30 Minutes Intravenous  Once 10/17/12 1137 10/17/12 1248   10/17/12 1145  vancomycin (VANCOCIN) 500 mg in sodium chloride 0.9 % 100 mL IVPB     500 mg 100 mL/hr over 60 Minutes Intravenous  Once 10/17/12 1139 10/17/12 1437     Antibiotics Given (last 72 hours)   Date/Time Action Medication Dose Rate   10/17/12 1337 Given   vancomycin (VANCOCIN) 500 mg in sodium chloride 0.9 % 100 mL IVPB 500 mg 100 mL/hr   10/17/12 2351 Given   vancomycin (VANCOCIN) 500 mg in  sodium chloride 0.9 % 100 mL IVPB 500 mg 100 mL/hr   10/18/12 1141 Given   vancomycin (VANCOCIN) 500 mg in sodium chloride 0.9 % 100 mL IVPB 500 mg 100 mL/hr   10/18/12 1141 Given   ceFEPIme (MAXIPIME) 1 g in dextrose 5 % 50 mL IVPB 1 g 100 mL/hr   10/18/12 1518 Given   levofloxacin (LEVAQUIN) IVPB 500 mg 500 mg 100 mL/hr      HPI/Subjective: - Feels more short-winded this morning  Objective: Filed Vitals:   10/18/12 2042 10/18/12 2225 10/19/12 0500 10/19/12 0611  BP: 95/50 95/51 125/71    Pulse: 106  122 120  Temp: 97.5 F (36.4 C)  98.3 F (36.8 C)   TempSrc: Oral  Oral   Resp: 18  22   Height:      Weight:      SpO2: 94%  91%     Intake/Output Summary (Last 24 hours) at 10/19/12 0814 Last data filed at 10/19/12 0630  Gross per 24 hour  Intake 5357.5 ml  Output   1185 ml  Net 4172.5 ml   Filed Weights   10/17/12 1321  Weight: 55.656 kg (122 lb 11.2 oz)    Exam:   General:  No apparent distress although breaths a bit harder  Cardiovascular: regular rate and rhythm, without MRG  Respiratory: distant breath sounds, no wheezing, decreased breath sounds on left  Abdomen: soft, not tender to palpation, positive bowel sounds  MSK: upper hand edema, bilateral.   Neuro: non focal  Data Reviewed: Basic Metabolic Panel:  Recent Labs Lab 10/17/12 1045 10/18/12 0500 10/19/12 0440  NA 130* 132* 133*  K 3.9 3.3* 3.6  CL 92* 99 102  CO2 26 24 24   GLUCOSE 93 109* 106*  BUN 29* 31* 30*  CREATININE 1.36* 1.22 1.00  CALCIUM 9.5 8.7 8.9  MG  --  1.9  --    Liver Function Tests:  Recent Labs Lab 10/17/12 1045 10/18/12 0500  AST 44* 40*  ALT 42 34  ALKPHOS 155* 128*  BILITOT 0.6 0.9  PROT 6.8 5.5*  ALBUMIN 2.1* 1.6*   CBC:  Recent Labs Lab 10/17/12 1045 10/18/12 0500 10/19/12 0440  WBC 2.4* 2.4* 4.7  NEUTROABS 2.2 2.2  --   HGB 8.0* 7.8* 8.0*  HCT 22.9* 22.4* 22.5*  MCV 86.7 85.5 87.2  PLT 34* 46* 89*   Cardiac Enzymes:  Recent Labs Lab 10/17/12 1045  TROPONINI <0.30   Studies: Dg Chest Portable 1 View  10/17/2012   *RADIOLOGY REPORT*  Clinical Data: History of lung cancer, short of breath and increasing cough  PORTABLE CHEST - 1 VIEW  Comparison: Most recent prior PET CT 08/02/2012; prior chest x-ray 07/23/2012  Findings: New irregular airspace opacity in the right middle lobe obscuring the right heart border.  Left upper lobe pulmonary mass again noted appears slightly decreased in size, query interval response to therapy.   Background emphysema and COPD.  Cardiac and mediastinal contours are unchanged.  No pneumothorax.  IMPRESSION:  1.  New patchy airspace disease in the right middle lobe concerning for pneumonia or aspiration.  Metastatic disease would be unlikely and the relatively short interval since the prior cross-sectional imaging. 2.  The left apical pulmonary mass is slightly smaller in appearance compared to prior which may reflect an interval response to therapy. 3.  Chronic background lung disease including emphysema and COPD.   Original Report Authenticated By: Malachy Moan, M.D.   Scheduled Meds: . feeding  supplement  237 mL Oral BID BM  . feeding supplement  1 Container Oral Q24H  . FLUoxetine  20 mg Oral Daily  . folic acid  1 mg Oral Daily  . levofloxacin (LEVAQUIN) IV  500 mg Intravenous Q24H  . multivitamin with minerals  1 tablet Oral q morning - 10a  . pantoprazole  40 mg Oral Daily  . sucralfate  1 g Oral TID WC & HS   Continuous Infusions: . sodium chloride 150 mL/hr at 10/19/12 4098    Active Problems:   Lung mass   3 subcentimeter brain metastases from adenocarcinoma of the lung   Malignant neoplasm of upper lobe, bronchus or lung   Brain metastases   Community acquired pneumonia   Immunocompromised   Sepsis   Hyponatremia   AKI (acute kidney injury)  Time spent: 89  Pamella Pert, MD [facility] Pager [phone]. If 7 PM - 7 AM, please contact night-coverage at [email or web address], password [email or web address] 10/19/2012, 8:14 AM  LOS: 2 days

## 2012-10-20 ENCOUNTER — Inpatient Hospital Stay (HOSPITAL_COMMUNITY): Payer: Medicare Other

## 2012-10-20 LAB — BASIC METABOLIC PANEL
Calcium: 9.1 mg/dL (ref 8.4–10.5)
Creatinine, Ser: 0.99 mg/dL (ref 0.50–1.35)
GFR calc Af Amer: >90 mL/min (ref >90)

## 2012-10-20 LAB — TYPE AND SCREEN: Unit division: 0

## 2012-10-20 LAB — CBC
MCH: 30 pg (ref 26.0–34.0)
MCHC: 34.6 g/dL (ref 30.0–36.0)
MCV: 86.7 fL (ref 78.0–100.0)
Platelets: 126 10*3/uL — ABNORMAL LOW (ref 150–400)
RDW: 15.5 % (ref 11.5–15.5)

## 2012-10-20 LAB — PHOSPHORUS: Phosphorus: 4.7 mg/dL — ABNORMAL HIGH (ref 2.3–4.6)

## 2012-10-20 MED ORDER — POTASSIUM CHLORIDE CRYS ER 20 MEQ PO TBCR
40.0000 meq | EXTENDED_RELEASE_TABLET | Freq: Once | ORAL | Status: AC
  Administered 2012-10-20: 40 meq via ORAL
  Filled 2012-10-20: qty 2

## 2012-10-20 NOTE — Progress Notes (Signed)
Patient ID: Luis Payne, male   DOB: 09-16-1940, 72 y.o.   MRN: [id number]  [facility] PROGRESS NOTE  GAGE WEANT FAO:[id number] DOB: Aug 07, 1940 DOA: 10/17/2012 PCP: Cassell Smiles., MD  Brief narrative: Luis Payne is a 72 y.o. 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 Found to have PNA on CXR, and urine Legionella antigen came back positive.   Assessment/Plan:  Sepsis due to legionella pneumonia - febrile and tachycardic overnight, started on Levofloxacin 06/09 and will continue, ID following and appreciate their input. Patient continues to be tachycardic overnight but stable overall, BP at target range. Continue Levaquin day #3. 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  Pancytopenia Likely due to chronic illness as well as ongoing chemotherapy. No active bleeding, continue to monitor. Stable today.  Skin rash - Very mild hyperpigmentation, due to radiation therapy. Continue home topical gel.  Acute kidney injury - improving and will repeat BMP in AM Weakness - Likely due to acute illness  Hyponatremia - improving  Hypokalemia - will supplement today  Tobacco abuse - Patient smoked for about 60 years. Quit 2 weeks ago.   DVT prophylaxis - SCDs given thrombocytopenia   Code Status: Full  Family Communication: wife bedside  Disposition Plan: home when medically stable   Consultants:  Dr. Arbutus Ped as primary oncologist Procedures:   none  HPI/Subjective: No events overnight.   Objective: Filed Vitals:   10/20/12 1200 10/20/12 1414 10/20/12 1513 10/20/12 1939  BP:   128/69   Pulse: 118  125   Temp: 97.8 F (36.6 C)  98.2 F (36.8 C)   TempSrc: Oral  Oral   Resp: 17  20   Height:      Weight:      SpO2: 95% 92% 100% 91%    Intake/Output Summary (Last 24 hours) at 10/20/12 1945 Last data filed at 10/20/12 1820  Gross per 24 hour  Intake    120 ml  Output   1400 ml  Net  -1280 ml    Exam:   General:  Pt is alert, follows commands appropriately, not in acute distress  Cardiovascular: Regular rhythm, tachycardic, S1/S2, no murmurs, no rubs, no gallops  Respiratory: Clear to auscultation bilaterally, decreased breath sounds at bases   Abdomen: Soft, non tender, non distended, bowel sounds present, no guarding  Extremities: No edema, pulses DP and PT palpable bilaterally  Neuro: Grossly nonfocal  Data Reviewed: Basic Metabolic Panel:  Recent Labs Lab 10/17/12 1045 10/18/12 0500 10/19/12 0440 10/20/12 0325  NA 130* 132* 133* 136  K 3.9 3.3* 3.6 3.0*  CL 92* 99 102 100  CO2 26 24 24 28   GLUCOSE 93 109* 106* 108*  BUN 29* 31* 30* 31*  CREATININE 1.36* 1.22 1.00 0.99  CALCIUM 9.5 8.7 8.9 9.1  MG  --  1.9  --  1.9  PHOS  --   --   --  4.7*   Liver Function Tests:  Recent Labs Lab 10/17/12 1045 10/18/12 0500  AST 44* 40*  ALT 42 34  ALKPHOS 155* 128*  BILITOT 0.6 0.9  PROT 6.8 5.5*  ALBUMIN 2.1* 1.6*   CBC:  Recent Labs Lab 10/17/12 1045 10/18/12 0500 10/19/12 0440 10/20/12 0325  WBC 2.4* 2.4* 4.7 5.1  NEUTROABS 2.2 2.2  --   --   HGB 8.0* 7.8* 8.0* 7.9*  HCT 22.9* 22.4* 22.5* 22.8*  MCV 86.7 85.5 87.2 86.7  PLT 34* 46* 89* 126*   Cardiac Enzymes:  Recent Labs Lab 10/17/12 1045  TROPONINI <0.30   Recent Results (from the past 240 hour(s))  CULTURE, BLOOD (ROUTINE X 2)     Status: None   Collection Time    10/17/12 12:40 PM      Result Value Range Status    Specimen Description BLOOD LEFT ARM  10 ML IN [facility] BOTTLE   Final   Special Requests Immunocompromised TAKEN AFTER 20 ML OF ANTIBIOTICS   Final   Culture  Setup Time 10/17/2012 20:04   Final   Culture     Final   Value:        BLOOD CULTURE RECEIVED NO GROWTH TO DATE CULTURE WILL BE HELD FOR 5 DAYS BEFORE ISSUING A FINAL NEGATIVE REPORT   Report Status PENDING   Incomplete  CULTURE, BLOOD (ROUTINE X 2)     Status: None   Collection Time    10/17/12 12:50 PM      Result Value Range Status   Specimen Description BLOOD RIGHT FOREARM  5.5 ML IN [facility] BOTTLE   Final   Special Requests Immunocompromised TAKEN AFTER 20 ML OF ANTIBIOTICS   Final   Culture  Setup Time 10/17/2012 20:04   Final   Culture     Final   Value:        BLOOD CULTURE RECEIVED NO GROWTH TO DATE CULTURE WILL BE HELD FOR 5 DAYS BEFORE ISSUING A FINAL NEGATIVE REPORT   Report Status PENDING   Incomplete  MRSA PCR SCREENING     Status: None   Collection Time    10/19/12  2:12 PM      Result Value Range Status   MRSA by PCR NEGATIVE  NEGATIVE Final   Comment:            The GeneXpert MRSA Assay (FDA     approved for NASAL specimens     only), is one component of a     comprehensive MRSA colonization     surveillance program. It is not     intended to diagnose MRSA     infection nor to guide or     monitor treatment for     MRSA infections.     Scheduled Meds: . FLUoxetine  20 mg Oral Daily  . folic acid  1 mg Oral Daily  . ipratropium  0.5 mg Nebulization Q6H  . levalbuterol  0.63 mg Nebulization Q6H  . levofloxacin IV  500 mg Intravenous Q24H  . multivitamin  1 tablet Oral q morning - 10a  . pantoprazole  40 mg Oral Daily  . rifampin  300 mg Oral Q12H  . sucralfate  1 g Oral TID WC & HS   Continuous Infusions:   Debbora Presto, MD  [facility] Pager [phone]  If 7PM-7AM, please contact night-coverage [email or web address] Password [email or web address] 10/20/2012, 7:45 PM   LOS: 3 days

## 2012-10-20 NOTE — Progress Notes (Addendum)
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 Kathrynn Running  He is under Systemic chemotherapy with carboplatin and Alimta every 3 weeks, status post 2 cycles. First dose was given on May, 05, 2014.    He has had 25 rtx treatments and his second chemotherapy 1 week ago. He has had increased weakness, low energy levels, non productive cough.  Admitted on 6-8 with pneumonia that was from legionella. ID is following him and he is on levaquin. 6-10 he has increased heart rate 130's, tachyphemic with adequate blood pressure. He is a positive 3 litre's over 24 hours and diuresis has been ordered. He is being transferred to SDU and PCCM asked to assist in his care. He is a full code by records.   SIGNIFICANT EVENTS / STUDIES:  6-11 negative 3 l  LINES / TUBES:   CULTURES: 6-8 bc x 2>>   ANTIBIOTICS: 6-9 levaquin>>   SUBJECTIVE:   VITAL SIGNS: Temp:  [97.5 F (36.4 C)-100 F (37.8 C)] 97.7 F (36.5 C) (06/11 0400) Pulse Rate:  [32-131] 97 (06/11 0600) Resp:  [14-29] 17 (06/11 0600) BP: (106-165)/(49-86) 117/61 mmHg (06/11 0600) SpO2:  [90 %-98 %] 98 % (06/11 0400) Weight:  [60.8 kg (134 lb 0.6 oz)] 60.8 kg (134 lb 0.6 oz) (06/11 0400) HEMODYNAMICS:   VENTILATOR SETTINGS:   INTAKE / OUTPUT: Intake/Output     06/10 0701 - 06/11 0700 06/11 0701 - 06/12 0700   P.O. 120    I.V. (mL/kg)     IV Piggyback 100    Total  Intake(mL/kg) 220 (3.6)    Urine (mL/kg/hr) 3150 (2.2)    Total Output 3150     Net -2930          Urine Occurrence 1 x      PHYSICAL EXAMINATION: General:  Emaciated wm in no acute distress Neuro:  Intact HEENT:  No LAN Cardiovascular:  HSR RRR st Lungs:  Coarse rhonchi l>r, improved Abdomen:  +bs Musculoskeletal:  intact Skin:  warm  LABS:  Recent Labs Lab 10/17/12 1045 10/17/12 1347 10/18/12 0500 10/19/12 0440 10/19/12 1042 10/20/12 0325  HGB 8.0*  --  7.8* 8.0*  --  7.9*  WBC 2.4*  --  2.4* 4.7  --  5.1  PLT 34*  --  46* 89*  --  126*  NA 130*  --  132* 133*  --  136  K 3.9  --  3.3* 3.6  --  3.0*  CL 92*  --  99 102  --  100  CO2 26  --  24 24  --  28  GLUCOSE 93  --  109* 106*  --  108*  BUN 29*  --  31* 30*  --  31*  CREATININE 1.36*  --  1.22 1.00  --  0.99  CALCIUM 9.5  --  8.7 8.9  --  9.1  MG  --   --  1.9  --   --  1.9  PHOS  --   --   --   --   --  4.7*  AST 44*  --  40*  --   --   --   ALT 42  --  34  --   --   --   ALKPHOS 155*  --  128*  --   --   --   BILITOT 0.6  --  0.9  --   --   --   PROT 6.8  --  5.5*  --   --   --   ALBUMIN 2.1*  --  1.6*  --   --   --   LATICACIDVEN  --  2.4*  --   --   --   --   TROPONINI <0.30  --   --   --   --   --   PHART  --   --   --   --  7.414  --   PCO2ART  --   --   --   --  33.8*  --   PO2ART  --   --   --   --  60.7*  --    No results found for this basename: GLUCAP,  in the last 168 hours  Imaging: Dg Chest 1 View  10/19/2012   *RADIOLOGY REPORT*  Clinical Data: Shortness of breath, weakness, COPD, smoker, history lung cancer  CHEST - 1 VIEW  Comparison: Portable exam 0906 hours compared to 10/17/2012  Findings: Upper normal heart size. Increased infiltrates identified in right lung diffusely. Underlying emphysematous changes and chronic bronchitis. Persistent left upper lobe opacity compatible with known mass. Minimal atelectasis at left base Small right pleural effusion. No pneumothorax.  IMPRESSION:  Persistent left upper lobe mass. Changes of COPD with bibasilar atelectasis, small right pleural effusion, and increase in right lung infiltrates.   Original Report Authenticated By: Ulyses Southward, M.D.     ASSESSMENT / PLAN:  PULMONARY A:Acute Hypoxic resp failure     COPD    Lung cancer with brainmets    Legionella Pna P:   -O2 as needed -Add bd's(xopenex due to st.)  CARDIOVASCULAR A: Sinus tachycardia with adequate bp P:  - diuresis negative 3 litre's 6-11  Intake/Output Summary (Last 24 hours) at 10/20/12 0727 Last data filed at 10/20/12 0600  Gross per 24 hour  Intake    220 ml  Output   3150 ml  Net  -2930 ml    -Minimise BB use esp if bronchospasm   RENAL Lab Results  Component Value Date   CREATININE 0.99 10/20/2012   CREATININE 1.00 10/19/2012   CREATININE 1.22 10/18/2012   CREATININE 1.0 10/11/2012   CREATININE 1.1 10/05/2012   CREATININE 1.0 09/28/2012  repleted K+  A: No acute issue     Lactic acidosis P:   -Monitor creatine -monitor lactic acid GASTROINTESTINAL A:  GI protection P:   -PPI  HEMATOLOGIC A:  Anemia, thrombocytopenia P:  -transfuse as needed for hgb <7.0  INFECTIOUS A:  Legionella pna P:   -per ID see flows  ENDOCRINE  Recent Labs Lab 10/18/12 0500 10/19/12 0440 10/20/12 0325  NA 132* 133* 136    A:  Mild hyponatremia  -related to pna P:   -replete lytes as needed NEUROLOGIC A:  Brain mets post stereotatic radiotherapy x 2 lesions.  P:   Monitor neuro status   TODAY'S SUMMARY:  Metastatic lung CA  Admitted on 6-8 with pneumonia that was from legionella. Now with increasing hypoxia , improving pancytopenia- ? Worsening pna vs fluid overload Was DNR but full code now since reversible process 6-11 much improved with diuresis and BD's. Consider return to floor status per IM team.  Brett Canales Minor ACNP Ad[facility]ack PCCM Pager [phone] till 3 pm If no answer page [phone]  Patient was DNR prior to this admission, reversed  due to reversibility of condition, recommend discussing that prior to discharge given overall poor prognosis with metastatic lung cancer, stable from a PCCM standpoint, no indication for thora at this point, recommend continuation of diureses as renal function allows.  PCCM will sign off, please call back if needed.  Patient seen and examined, agree with above note.  I dictated the care and orders written for this patient under my direction.  Alyson Reedy, MD [phone]

## 2012-10-20 NOTE — Progress Notes (Signed)
Hypokalemia   K replaced  

## 2012-10-20 NOTE — Progress Notes (Signed)
[facility]  Day #3 levaquin  Subjective: Feels much better but still weak   Antibiotics:  Anti-infectives   Start     Dose/Rate Route Frequency Ordered Stop   10/19/12 1400  rifampin (RIFADIN) capsule 300 mg     300 mg Oral Every 12 hours 10/19/12 1313     10/18/12 1600  levofloxacin (LEVAQUIN) IVPB 500 mg     500 mg 100 mL/hr over 60 Minutes Intravenous Every 24 hours 10/18/12 1454     10/18/12 1200  ceFEPIme (MAXIPIME) 1 g in dextrose 5 % 50 mL IVPB  Status:  Discontinued     1 g 100 mL/hr over 30 Minutes Intravenous Every 24 hours 10/17/12 1527 10/18/12 1454   10/18/12 0000  vancomycin (VANCOCIN) 500 mg in sodium chloride 0.9 % 100 mL IVPB  Status:  Discontinued     500 mg 100 mL/hr over 60 Minutes Intravenous Every 12 hours 10/17/12 1140 10/18/12 1454   10/17/12 1145  ceFEPIme (MAXIPIME) 1 g in dextrose 5 % 50 mL IVPB     1 g 100 mL/hr over 30 Minutes Intravenous  Once 10/17/12 1137 10/17/12 1248   10/17/12 1145  vancomycin (VANCOCIN) 500 mg in sodium chloride 0.9 % 100 mL IVPB     500 mg 100 mL/hr over 60 Minutes Intravenous  Once 10/17/12 1139 10/17/12 1437      Medications: Scheduled Meds: . feeding supplement  237 mL Oral BID BM  . feeding supplement  1 Container Oral Q24H  . FLUoxetine  20 mg Oral Daily  . folic acid  1 mg Oral Daily  . ipratropium  0.5 mg Nebulization Q6H  . levalbuterol  0.63 mg Nebulization Q6H  . levofloxacin (LEVAQUIN) IV  500 mg Intravenous Q24H  . multivitamin with minerals  1 tablet Oral q morning - 10a  . pantoprazole  40 mg Oral Daily  . rifampin  300 mg Oral Q12H  . sucralfate  1 g Oral TID WC & HS   Continuous Infusions:  PRN Meds:.acetaminophen, hyaluronate sodium, levalbuterol, metoprolol, oxyCODONE-acetaminophen   Objective: Weight change:   Intake/Output Summary (Last 24 hours) at 10/20/12 1751 Last data filed at 10/20/12 1200  Gross per 24 hour  Intake    120 ml  Output   1250 ml  Net   -1130 ml   Blood pressure 128/69, pulse 125, temperature 98.2 F (36.8 C), temperature source Oral, resp. rate 20, height 5\' 5"  (1.651 m), weight 134 lb 0.6 oz (60.8 kg), SpO2 100.00%. Temp:  [97.5 F (36.4 C)-99 F (37.2 C)] 98.2 F (36.8 C) (06/11 1513) Pulse Rate:  [97-125] 125 (06/11 1513) Resp:  [14-25] 20 (06/11 1513) BP: (106-161)/(49-79) 128/69 mmHg (06/11 1513) SpO2:  [92 %-100 %] 100 % (06/11 1513) Weight:  [134 lb 0.6 oz (60.8 kg)] 134 lb 0.6 oz (60.8 kg) (06/11 0400)  Physical Exam:   General: Alert and awake, oriented x3, not in any acute distress. cachectic  HEENT: anicteric sclera, pupils , EOMI, oropharynx clear and without exudate  CVS regular rate, normal r, no murmur rubs or gallops  Chest: prolonged exp phase, decreased bs right side  Abdomen: soft nontender, nondistended, normal bowel sounds,  Extremities: no clubbing or edema noted bilaterally  Skin: no rashes  Neuro: nonfocal, strength and sensation intact   Lab Results:  Recent Labs  10/19/12 0440 10/20/12 0325  WBC 4.7 5.1  HGB 8.0* 7.9*  HCT 22.5* 22.8*  PLT 89* 126*  BMET  Recent Labs  10/19/12 0440 10/20/12 0325  NA 133* 136  K 3.6 3.0*  CL 102 100  CO2 24 28  GLUCOSE 106* 108*  BUN 30* 31*  CREATININE 1.00 0.99  CALCIUM 8.9 9.1    Micro Results: Recent Results (from the past 240 hour(s))  CULTURE, BLOOD (ROUTINE X 2)     Status: None   Collection Time    10/17/12 12:40 PM      Result Value Range Status   Specimen Description BLOOD LEFT ARM  10 ML IN [facility] BOTTLE   Final   Special Requests Immunocompromised TAKEN AFTER 20 ML OF ANTIBIOTICS   Final   Culture  Setup Time 10/17/2012 20:04   Final   Culture     Final   Value:        BLOOD CULTURE RECEIVED NO GROWTH TO DATE CULTURE WILL BE HELD FOR 5 DAYS BEFORE ISSUING A FINAL NEGATIVE REPORT   Report Status PENDING   Incomplete  CULTURE, BLOOD (ROUTINE X 2)     Status: None   Collection Time    10/17/12 12:50 PM       Result Value Range Status   Specimen Description BLOOD RIGHT FOREARM  5.5 ML IN [facility] BOTTLE   Final   Special Requests Immunocompromised TAKEN AFTER 20 ML OF ANTIBIOTICS   Final   Culture  Setup Time 10/17/2012 20:04   Final   Culture     Final   Value:        BLOOD CULTURE RECEIVED NO GROWTH TO DATE CULTURE WILL BE HELD FOR 5 DAYS BEFORE ISSUING A FINAL NEGATIVE REPORT   Report Status PENDING   Incomplete  MRSA PCR SCREENING     Status: None   Collection Time    10/19/12  2:12 PM      Result Value Range Status   MRSA by PCR NEGATIVE  NEGATIVE Final   Comment:            The GeneXpert MRSA Assay (FDA     approved for NASAL specimens     only), is one component of a     comprehensive MRSA colonization     surveillance program. It is not     intended to diagnose MRSA     infection nor to guide or     monitor treatment for     MRSA infections.    Studies/Results: Dg Chest 1 View  10/19/2012   *RADIOLOGY REPORT*  Clinical Data: Shortness of breath, weakness, COPD, smoker, history lung cancer  CHEST - 1 VIEW  Comparison: Portable exam 0906 hours compared to 10/17/2012  Findings: Upper normal heart size. Increased infiltrates identified in right lung diffusely. Underlying emphysematous changes and chronic bronchitis. Persistent left upper lobe opacity compatible with known mass. Minimal atelectasis at left base Small right pleural effusion. No pneumothorax.  IMPRESSION: Persistent left upper lobe mass. Changes of COPD with bibasilar atelectasis, small right pleural effusion, and increase in right lung infiltrates.   Original Report Authenticated By: Ulyses Southward, M.D.   Dg Chest Port 1 View  10/20/2012   *RADIOLOGY REPORT*  Clinical Data: Pneumonia.  PORTABLE CHEST - 1 VIEW  Comparison: 10/19/2012 and CT chest 07/23/2012.  Findings: Trachea is midline.  Heart size stable. Irregular mass- like opacity at the apex of the left hemithorax is again noted. There is asymmetric air space disease in the  right lung with a small to moderate right pleural effusion.  IMPRESSION:  1.  Left apical lung mass. 2.  Asymmetric right lung air space disease, possibly due to pneumonia, with a small to moderate right pleural effusion.   Original Report Authenticated By: Leanna Battles, M.D.      Assessment/Plan: Luis Payne is a 72 y.o. male with  metastatic lung cancer on chemotherapy and XRT with severe Legionella PNA    #1 Legionella PNA:  --agree with Levaquin and would treat for at least  14 days (can change to po now( --added oral  Rifampin 300 mg bid and he can continue this during his course of therapy vs dc and go with levaquin alone  He clarified that he has NOT been using well water in nebulizer rx at home but does shower with well water, states he has been exposed to water fountain outside here at [facility] waiting for ride (again we DO NOT have other pts with epi link to this one in health care system so far  --[facility] health dept should be automatically notified by lab  --would be wise to have pt look into having his well water tested   #2 Screening: hiv and hep c are negative  #3 Pancytopenia: due to severe infection but improving.  I will arrange HSFU in our clinic next week but will sign off for now.  Please call with further questions.      LOS: 3 days   Acey Lav 10/20/2012, 5:51 PM

## 2012-10-21 LAB — BASIC METABOLIC PANEL
BUN: 29 mg/dL — ABNORMAL HIGH (ref 6–23)
CO2: 31 mEq/L (ref 19–32)
Calcium: 9.1 mg/dL (ref 8.4–10.5)
GFR calc non Af Amer: 82 mL/min — ABNORMAL LOW (ref >90)
Glucose, Bld: 97 mg/dL (ref 70–99)

## 2012-10-21 LAB — CBC
Hemoglobin: 7.9 g/dL — ABNORMAL LOW (ref 13.0–17.0)
MCH: 30.7 pg (ref 26.0–34.0)
MCHC: 35.3 g/dL (ref 30.0–36.0)
MCV: 87.2 fL (ref 78.0–100.0)

## 2012-10-21 MED ORDER — LEVOFLOXACIN 500 MG PO TABS
500.0000 mg | ORAL_TABLET | ORAL | Status: DC
  Administered 2012-10-21: 500 mg via ORAL
  Filled 2012-10-21 (×2): qty 1

## 2012-10-21 MED ORDER — POTASSIUM CHLORIDE CRYS ER 20 MEQ PO TBCR
40.0000 meq | EXTENDED_RELEASE_TABLET | Freq: Two times a day (BID) | ORAL | Status: AC
  Administered 2012-10-21 (×2): 40 meq via ORAL
  Filled 2012-10-21 (×2): qty 2

## 2012-10-21 MED ORDER — METOPROLOL TARTRATE 1 MG/ML IV SOLN: 5.0000 mg | Freq: Four times a day (QID) | INTRAVENOUS | Status: DC | PRN

## 2012-10-21 MED ORDER — FUROSEMIDE 20 MG PO TABS
20.0000 mg | ORAL_TABLET | Freq: Every day | ORAL | Status: DC
  Administered 2012-10-21 – 2012-10-22 (×2): 20 mg via ORAL
  Filled 2012-10-21 (×2): qty 1

## 2012-10-21 NOTE — Progress Notes (Signed)
NUTRITION FOLLOW UP  Intervention:   - Continue nutrition supplementation - Discussed strategies to help with dry mouth - Pt was told he would get an SLP evaluation for dysphagia, recommend MD order if appropriate - Will continue to monitor   Nutrition Dx:   Inadequate oral intake related to poor appetite, SOB, and swallowing difficulty as evidenced by 5% wt loss in less than 1 month - improving    Goal:   Pt to meet >/= 90% of their estimated nutrition needs - met today with 100% lunch intake   Monitor:   Weights, labs, intake   Assessment:   Pt discussed during interdisciplinary rounds. RN contacted RD for concern over pt's nutrition due to his difficulty swallowing. Pt reports this morning was the best he has eaten in several days. Pt was able to eat 1/2 [place] toast and 100% of peaches on tray. Pt had his last radiation treatment on Friday and has had difficulty swallowing since. Pt seen by RD for full assessment 6/9 and was started on Ensure Complete and Magic Cup which pt has been consuming. Pt drank 1.5 Ensures yesterday and 1 Magic Cup. Pt reports having dry mouth from being on oxygen and from sleeping with his mouth open. Pt reports his problems swallowing are getting better. Pt's weight up 12 pounds since admission, unclear how accurate this is.   Height: Ht Readings from Last 1 Encounters:  10/17/12 5\' 5"  (1.651 m)    Weight Status:   Wt Readings from Last 1 Encounters:  10/20/12 134 lb 0.6 oz (60.8 kg)    Re-estimated needs:  Kcal: 1620-1850 Protein: 78-89g Fluid: 1.7-2 L   Skin: Intact   Diet Order: General   Intake/Output Summary (Last 24 hours) at 10/21/12 1436 Last data filed at 10/21/12 1300  Gross per 24 hour  Intake    360 ml  Output   1425 ml  Net  -1065 ml    Last BM: 6/10   Labs:   Recent Labs Lab 10/18/12 0500 10/19/12 0440 10/20/12 0325 10/21/12 0428  NA 132* 133* 136 137  K 3.3* 3.6 3.0* 3.0*  CL 99 102 100 99  CO2 24 24 28 31    BUN 31* 30* 31* 29*  CREATININE 1.22 1.00 0.99 0.92  CALCIUM 8.7 8.9 9.1 9.1  MG 1.9  --  1.9  --   PHOS  --   --  4.7*  --   GLUCOSE 109* 106* 108* 97    CBG (last 3)  No results found for this basename: GLUCAP,  in the last 72 hours  Scheduled Meds: . feeding supplement  237 mL Oral BID BM  . feeding supplement  1 Container Oral Q24H  . FLUoxetine  20 mg Oral Daily  . folic acid  1 mg Oral Daily  . ipratropium  0.5 mg Nebulization Q6H  . levalbuterol  0.63 mg Nebulization Q6H  . levofloxacin  500 mg Oral Q24H  . multivitamin with minerals  1 tablet Oral q morning - 10a  . pantoprazole  40 mg Oral Daily  . potassium chloride  40 mEq Oral BID  . rifampin  300 mg Oral Q12H  . sucralfate  1 g Oral TID WC & HS     Levon Hedger MS, RD, LDN [phone] Pager [phone] After Hours Pager

## 2012-10-21 NOTE — Progress Notes (Signed)
Physical Therapy Treatment Patient Details Name: Luis Payne MRN: [id number] DOB: January 17, 1941 Today's Date: 10/21/2012 Time: 4098-1191 PT Time Calculation (min): 26 min  PT Assessment / Plan / Recommendation Comments on Treatment Session  Pt feeling better and eager to D/C to home.  Spouse in room.  Pt on 4 lts O2.  Assisted pt OOB to amb in hallway twice with one sitting rest break.  Lowest O2 sats 85% with highest HR 135.  Pt instructed on purse lip breathing.  Pt knows when to stop activity and rest.     Follow Up Recommendations  Home health PT     Does the patient have the potential to tolerate intense rehabilitation     Barriers to Discharge        Equipment Recommendations       Recommendations for Other Services    Frequency Min 3X/week   Plan      Precautions / Restrictions Precautions Precautions: Fall Precaution Comments: needs O2 Restrictions Weight Bearing Restrictions: No   Pertinent Vitals/Pain No c/o pain    Mobility  Bed Mobility Bed Mobility: Supine to Sit Supine to Sit: 6: Modified independent (Device/Increase time) Details for Bed Mobility Assistance: increased time Transfers Transfers: Stand to Sit;Sit to Stand Sit to Stand: 5: Supervision;4: Min guard;From bed Stand to Sit: 5: Supervision;4: Min guard;To chair/3-in-1 Details for Transfer Assistance: good use of hands and safety cognition Ambulation/Gait Ambulation/Gait Assistance: 5: Supervision;4: Min guard Ambulation Distance (Feet): 30 Feet (150 feet x 2 one sitting rest break) Assistive device: Rolling walker Ambulation/Gait Assistance Details: amb on 4 lts O2 sats lowest 85% with HR highest 135.  Pt aware of when he needs to stop and rest.  Does not usually amb with AD but did so this session for safety. Gait Pattern: Step-through pattern;Decreased stride length;Trunk flexed     PT Goals                                                    progressing    Visit Information  Last PT  Received On: 10/21/12 Assistance Needed: +1    Subjective Data  Subjective: Let's do it Patient Stated Goal: home   Cognition    good   Balance   good  End of Session PT - End of Session Equipment Utilized During Treatment: Gait belt;Oxygen Activity Tolerance: Treatment limited secondary to medical complications (Comment) Patient left: in chair;with call bell/phone within reach   Felecia Shelling  PTA [facility]  Acute  Rehab Pager      [phone]

## 2012-10-21 NOTE — Progress Notes (Signed)
PHARMACIST - PHYSICIAN COMMUNICATION DR:   Izola Price CONCERNING: Antibiotic IV to Oral Route Change Policy  RECOMMENDATION: This patient is receiving Levaquin by the intravenous route.  Based on criteria approved by the Pharmacy and Therapeutics Committee, the antibiotic(s) is/are being converted to the equivalent oral dose form(s).   DESCRIPTION: These criteria include:  Patient being treated for a respiratory tract infection, urinary tract infection, or cellulitis  The patient is not neutropenic and does not exhibit a GI malabsorption state  The patient is eating (either orally or via tube) and/or has been taking other orally administered medications for a least 24 hours  The patient is improving clinically and has a Tmax < 100.5  ID consult 10/20/12 also recommends changing Levaquin to PO.  If you have questions about this conversion, please contact the Pharmacy Department  []   [phone] )  [facility] PharmD, [place] Pager [phone] 10/21/2012 12:58 PM

## 2012-10-21 NOTE — Clinical Documentation Improvement (Signed)
CLINICAL DOCUMENTATION QUERIES ARE NOT PART OF THE PERMANENT MEDICAL RECORD   Please update your documentation within the medical record to reflect your response to this query.                                                                                      10/21/12  Dr. Izola Price,   In a better effort to capture your patient's severity of illness, reflect appropriate length of stay and utilization of resources, a review of the patient medical record has revealed the following information:  Consult Note signed by Oretha Milch, MD at 10/19/2012 3:44 PM "6-10 he has increased heart rate 130's, tachyphemic with adequate blood pressure. He is a positive 3 litre's over 24 hours and diuresis has been ordered. He is being transferred to SDU and PCCM asked to assist in his care. He is a full code by records. CARDIOVASCULAR  A: Sinus tachycardia with adequate bp  P:  -Attempt diuresis  -Minimise BB use esp if bronchospasm  ? Worsening pna vs fluid overload"    Echo 10/19/2012 Study Conclusions - Left ventricle: The cavity size was normal. Wall thickness was normal. Systolic function was moderately to severely reduced. The estimated ejection fraction was in the range of 30% to 35%. Diffuse hypokinesis. The study is not technically sufficient to allow evaluation of LV diastolic function. - Aortic valve: Mild to moderate regurgitation directed centrally in the LVOT.   Progress Notes signed by Alyson Reedy, MD at 10/20/2012 8:33 AM "CARDIOVASCULAR  A: Sinus tachycardia with adequate bp  P:  - diuresis negative 3 litre's 6-11  Intake/Output Summary (Last 24 hours) at 10/20/12 0727 Last data filed at 10/20/12 0600   Gross per 24 hour   Intake  220 ml   Output  3150 ml   Net  -2930 ml   6-11 much improved with diuresis and BD's.  Consider return to floor status per IM team.  Patient was DNR prior to this admission, reversed due to reversibility of condition, recommend discussing that  prior to discharge given overall poor prognosis with metastatic lung cancer, stable from a PCCM standpoint, no indication for thora at this point, recommend continuation of diureses as renal function allows. PCCM will sign off, please call back if needed."    Based on your clinical judgment, please document in the progress notes and discharge summary if a condition below provides greater specificity regarding the patient's heart function and his response to IV diuretics:   - Acute on Chronic Systolic Heart Failure   - Other Condition   - Unable to Clinically Determine   In responding to this query please exercise your independent judgment.  The fact that a query is asked, does not imply that any particular answer is desired or expected.  Reviewed: please see progress note, 10/21/2012  Thank You,  Jerral Ralph  RN BSN CCDS Certified Clinical Documentation Specialist: Cell   [phone]  Health Information Management [place]    TO RESPOND TO THE THIS QUERY, FOLLOW THE INSTRUCTIONS BELOW:  1. If needed, update documentation for the patient's encounter via the notes activity.  2. Access this query  again and click edit on the In [employer].  3. After updating, or not, click F2 to complete all highlighted (required) fields concerning your review. Select "additional documentation in the medical record" OR "no additional documentation provided".  4. Click Sign note button.  5. The deficiency will fall out of your In Basket *Please let us know if you are not able to complete this workflow by phone or e-mail (listed below).

## 2012-10-21 NOTE — Progress Notes (Addendum)
Patient ID: Luis Payne, male   DOB: [birth year], 72 y.o.   MRN: [id number] [facility] PROGRESS NOTE  Luis Payne XBM:[id number] DOB: 1940/11/02 DOA: 10/17/2012 PCP: Cassell Smiles., MD  Brief narrative: Luis Payne is a 72 y.o. 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 Found to have PNA on CXR, and urine Legionella antigen came back positive.   Assessment/Plan:  Sepsis due to legionella pneumonia - still tachycardic but no fever overnight, started on Levofloxacin 06/09 and will continue, ID agreed with Levaquin and will sign off for now, outpatient follow up recommended. Pt may need oxygen upon discharge.  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  Sinus tachycardia - secondary to acute illness, continue PRN Metoprolol only, avoid if bronchospasm  Pancytopenia Likely due to chronic illness as well as ongoing chemotherapy. No active bleeding, continue to monitor. Platelets and WBC within normal limits this Am, Hg stable  Skin rash - Very mild hyperpigmentation, due to radiation therapy. Continue home topical gel.  Acute kidney injury - improving and within normal limits this AM.   Weakness - Likely due to acute illness, ambulating in hallway.  Hyponatremia - improving and within normal limits this AM Hypokalemia - will continue to supplement today  Tobacco abuse - Patient smoked for about 60 years. Quit 2 weeks ago.  DVT prophylaxis - SCDs  for now, thrombocytopenia resolved.  Systolic heart failure - EF 35 %, appears to be clinically euvolemic, no swelling in lower extremities and maintaining good urine output, monitor daily weights and I's and O's, continue to diurese with low dose Lasix    Code Status: Full  Family Communication: wife bedside  Disposition Plan: home when medically stable   Consultants:  Dr. Arbutus Ped as primary oncologist Procedures/Studies: Dg Chest Port 1 View 10/20/2012 1.  Left apical lung mass.  2.  Asymmetric right lung air space disease, possibly due to pneumonia, with a small to moderate right pleural effusion.   HPI/Subjective: No events overnight.   Objective: Filed Vitals:   10/20/12 1939 10/20/12 2218 10/21/12 0626 10/21/12 0826  BP:  115/63 113/67   Pulse:  118 107   Temp:  98.2 F (36.8 C) 97.8 F (36.6 C)   TempSrc:  Oral Oral   Resp:  20 20   Height:      Weight:      SpO2: 91% 95% 95% 93%    Intake/Output Summary (Last 24 hours) at 10/21/12 1210 Last data filed at 10/21/12 0900  Gross per 24 hour  Intake    240 ml  Output   1175 ml  Net   -935 ml    Exam:   General:  Pt is alert, follows commands appropriately, not in acute distress, frail  Cardiovascular: Regular rhythm, tachycardic, S1/S2, no murmurs, no rubs, no gallops  Respiratory: Decreased breath sounds at bases, mild rhonchi at bases   Abdomen: Soft, non tender, non distended, bowel sounds present, no guarding  Extremities: No edema, pulses DP and PT palpable bilaterally  Neuro: Grossly nonfocal  Data Reviewed: Basic Metabolic Panel:  Recent Labs Lab 10/17/12 1045 10/18/12 0500 10/19/12 0440 10/20/12 0325 10/21/12 0428  NA 130* 132* 133* 136 137  K 3.9 3.3* 3.6 3.0* 3.0*  CL 92* 99 102 100 99  CO2 26 24 24 28 31   GLUCOSE 93 109* 106* 108* 97  BUN 29* 31* 30* 31* 29*  CREATININE 1.36* 1.22 1.00 0.99 0.92  CALCIUM 9.5 8.7 8.9 9.1 9.1  MG  --  1.9  --  1.9  --   PHOS  --   --   --  4.7*  --     Liver Function Tests:  Recent Labs Lab 10/17/12 1045 10/18/12 0500  AST 44* 40*  ALT 42 34  ALKPHOS 155* 128*  BILITOT 0.6 0.9  PROT 6.8 5.5*  ALBUMIN 2.1* 1.6*    CBC:  Recent Labs Lab 10/17/12 1045 10/18/12 0500 10/19/12 0440 10/20/12 0325 10/21/12 0428  WBC 2.4* 2.4* 4.7 5.1 5.0  NEUTROABS 2.2 2.2  --   --   --   HGB 8.0* 7.8* 8.0* 7.9* 7.9*  HCT 22.9* 22.4* 22.5* 22.8* 22.4*  MCV 86.7 85.5 87.2 86.7 87.2  PLT 34* 46* 89* 126* 170   Cardiac Enzymes:  Recent Labs Lab 10/17/12 1045  TROPONINI <0.30   Recent Results (from the past 240 hour(s))  CULTURE, BLOOD (ROUTINE X 2)     Status: None   Collection Time    10/17/12 12:40 PM      Result Value Range Status   Specimen Description BLOOD LEFT ARM  10 ML IN [facility] BOTTLE   Final   Special Requests Immunocompromised TAKEN AFTER 20 ML OF ANTIBIOTICS   Final   Culture  Setup Time 10/17/2012 20:04   Final   Culture     Final   Value:        BLOOD CULTURE RECEIVED NO GROWTH TO DATE CULTURE WILL BE HELD FOR 5 DAYS BEFORE ISSUING A FINAL NEGATIVE REPORT   Report Status PENDING   Incomplete  CULTURE, BLOOD (ROUTINE X 2)     Status: None   Collection Time    10/17/12 12:50 PM      Result Value Range Status   Specimen Description BLOOD RIGHT FOREARM  5.5 ML IN [facility] BOTTLE   Final   Special Requests Immunocompromised TAKEN AFTER 20 ML OF ANTIBIOTICS   Final   Culture  Setup Time 10/17/2012 20:04   Final   Culture     Final   Value:        BLOOD CULTURE RECEIVED NO GROWTH TO DATE CULTURE WILL BE HELD FOR 5 DAYS BEFORE ISSUING A FINAL NEGATIVE REPORT   Report Status PENDING   Incomplete  MRSA PCR SCREENING     Status: None   Collection Time    10/19/12  2:12 PM      Result Value Range Status   MRSA by PCR NEGATIVE  NEGATIVE Final   Comment:            The GeneXpert MRSA Assay (FDA     approved for NASAL specimens     only), is one component of a     comprehensive MRSA colonization     surveillance program. It is  not     intended to diagnose MRSA     infection nor to guide or     monitor treatment for  MRSA infections.     Scheduled Meds: . feeding supplement  237 mL Oral BID BM  . feeding supplement  1 Container Oral Q24H  . FLUoxetine  20 mg Oral Daily  . folic acid  1 mg Oral Daily  . ipratropium  0.5 mg Nebulization Q6H  . levalbuterol  0.63 mg Nebulization Q6H  . levofloxacin (LEVAQUIN) IV  500 mg Intravenous Q24H  . multivitamin with minerals  1 tablet Oral q morning - 10a  . pantoprazole  40 mg Oral Daily  . rifampin  300 mg Oral Q12H  . sucralfate  1 g Oral TID WC & HS   Continuous Infusions:    Debbora Presto, MD  [facility] Pager [phone]  If 7PM-7AM, please contact night-coverage [email or web address] Password [email or web address] 10/21/2012, 12:10 PM   LOS: 4 days

## 2012-10-22 LAB — BASIC METABOLIC PANEL
Calcium: 9.3 mg/dL (ref 8.4–10.5)
Creatinine, Ser: 0.8 mg/dL (ref 0.50–1.35)
GFR calc non Af Amer: 87 mL/min — ABNORMAL LOW (ref >90)
Glucose, Bld: 102 mg/dL — ABNORMAL HIGH (ref 70–99)
Sodium: 137 mEq/L (ref 135–145)

## 2012-10-22 MED ORDER — ENSURE COMPLETE PO LIQD: 237.0000 mL | Freq: Two times a day (BID) | ORAL | Status: DC

## 2012-10-22 MED ORDER — RIFAMPIN 300 MG PO CAPS: 300.0000 mg | ORAL_CAPSULE | Freq: Two times a day (BID) | ORAL | Status: DC

## 2012-10-22 MED ORDER — BENZONATATE 100 MG PO CAPS
200.0000 mg | ORAL_CAPSULE | Freq: Three times a day (TID) | ORAL | Status: DC
  Administered 2012-10-22: 200 mg via ORAL
  Filled 2012-10-22 (×4): qty 2

## 2012-10-22 MED ORDER — OXYCODONE-ACETAMINOPHEN 5-325 MG PO TABS: 1.0000 | ORAL_TABLET | Freq: Four times a day (QID) | ORAL | Status: DC | PRN

## 2012-10-22 MED ORDER — BENZONATATE 200 MG PO CAPS: 200.0000 mg | ORAL_CAPSULE | Freq: Three times a day (TID) | ORAL | Status: DC

## 2012-10-22 MED ORDER — IPRATROPIUM BROMIDE 0.02 % IN SOLN: 0.5000 mg | Freq: Four times a day (QID) | RESPIRATORY_TRACT | Status: DC | PRN

## 2012-10-22 MED ORDER — LEVALBUTEROL HCL 0.63 MG/3ML IN NEBU: 0.6300 mg | INHALATION_SOLUTION | RESPIRATORY_TRACT | Status: DC | PRN

## 2012-10-22 MED ORDER — FUROSEMIDE 20 MG PO TABS: 20.0000 mg | ORAL_TABLET | Freq: Every day | ORAL | Status: DC

## 2012-10-22 MED ORDER — LEVOFLOXACIN 500 MG PO TABS: 500.0000 mg | ORAL_TABLET | ORAL | Status: DC

## 2012-10-22 NOTE — Progress Notes (Signed)
Please document sats as below per Medicare Guidelines.   SATURATION QUALIFICATIONS: (This note is used to comply with regulatory documentation for home oxygen)  Patient Saturations on Room Air at Rest = ___%  Patient Saturations on Room Air while Ambulating = ___%  Patient Saturations on ___ Liters of oxygen while Ambulating = __%  Please briefly explain why patient needs home oxygen:

## 2012-10-22 NOTE — Progress Notes (Addendum)
[id number]/[id number] Davis,Rn,BSN,CCM: Spoke with patient and wife would like to use [facility].  Tct-[facility]-orders for RN,PT,OT and Aide given.  TCT to DME-rep-Lecretia, orders for home 02 at 4l/min and 02 parameters given.  Per medicare requirement and notes patient does desat. Down to 88% o2 level on room air at rest.

## 2012-10-22 NOTE — Discharge Summary (Signed)
Physician Discharge Summary  MANASSEH PITTSLEY JWJ:[id number] DOB: 1941/03/26 DOA: 10/17/2012  PCP: Cassell Smiles., MD  Admit date: 10/17/2012 Discharge date: 10/22/2012  Recommendations for Outpatient Follow-up:  1. Pt will need to follow up with PCP in 2-3 weeks post discharge 2. Please obtain BMP to evaluate electrolytes and kidney function 3. Please also check CBC to evaluate Hg and Hct levels 4. Please note that pt was discharged on Levaquin and Rifampin to complete therapy for 14 days post discharge as recommended by ID specialist 5. Pt also discharged on oxygen and has required 4 L while inpatient 6. Please note that 2 D ECHO this admission showed systolic function of 35% and pt has no know history of CHF, he was told that [facility] cardiology team will call him for an appointment time and date, I personally spoke with [facility] cardiology team and they reassured me they will call pt on Monday 7. 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 Found to have PNA on CXR, and urine Legionella antigen came back positive.   Assessment/Plan:  Sepsis due to legionella pneumonia - still tachycardic but no fever overnight, started on Levofloxacin 06/09 and will continue, ID agreed with Levaquin and will need to complete therapy for 14 days along with Rifampin, outpatient follow up recommended. Pt needed oxygen upon discharge, 4 liters continuous. 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  Sinus tachycardia - secondary to acute illness, likely aggravated by nebulizer treatments, no chest pain reported inpatient   Pancytopenia Likely due to chronic illness as well as ongoing chemotherapy. No active bleeding, continue to monitor. Platelets and WBC within normal limits, Hg stable  Skin rash - Very mild hyperpigmentation, due to radiation therapy. Continue home topical gel.  Acute kidney injury - improving and within normal limits this AM.  Weakness - Likely due to acute illness, ambulating in hallway.  Hyponatremia - improving and within normal limits this AM  Hypokalemia - supplemented and within normal limits this AM Tobacco abuse - Patient smoked for about 60 years. Quit 2 weeks ago.  Systolic heart failure - EF 35 %, appears to be clinically euvolemic, no swelling in lower extremities and maintaining good urine output, monitor daily weights and I's and O's, continue to diurese with low dose Lasix  Severe malnutrition - secondary to progressive illness and failure to thrive -  supplementation with ensure discussed and encouraged, pt tolerating well  Code Status: Full  Family Communication: wife at bedside   Consultants:  Dr. Arbutus Ped as primary oncologist  Procedures/Studies:  Dg Chest Port 1 View 10/20/2012  1. Left apical lung mass.  2. Asymmetric right lung air space  disease, possibly due to pneumonia, with a small to moderate right pleural effusion.   Discharge Exam: Filed Vitals:   10/22/12 0446  BP: 134/67  Pulse: 119  Temp: 98.1 F (36.7 C)  Resp: 20   Filed Vitals:   10/22/12 0138 10/22/12 0210 10/22/12 0446 10/22/12 0849  BP:  121/64 134/67   Pulse:  113 119   Temp:   98.1 F (36.7 C)   TempSrc:   Oral   Resp:   20   Height:      Weight:   58.469 kg (128 lb 14.4 oz)   SpO2: 89% 93% 92% 88%    General: Pt is alert, follows commands appropriately, not in acute distress, frail and cachectic  Cardiovascular: Regular rhythm, tachycardic, S1/S2 +, no murmurs, no rubs, no gallops Respiratory: Clear to auscultation bilaterally, bibasilar crackles and scattered rhonchi  Abdominal: Soft, non tender, non distended, bowel sounds +, no guarding Extremities: no edema, no cyanosis, pulses palpable bilaterally DP and PT Neuro: Grossly nonfocal  Discharge Instructions  Discharge Orders   Future Appointments Provider Department Dept Phone   10/25/2012 10:30 AM Krista Blue [facility] [phone]   10/25/2012 11:00 AM Si Gaul, MD [facility] MEDICAL ONCOLOGY [phone]   10/25/2012 12:00 PM [facility] [phone]   10/28/2012 11:15 AM Gardiner Barefoot, MD [facility] for Infectious Disease [phone]   11/01/2012 10:45 AM [facility] [place] [facility] [phone]   11/08/2012 11:45 AM [facility] [place] [facility] [phone]   11/15/2012 8:00 AM Dava Najjar Idelle Jo [facility] [phone]   11/15/2012 8:30 AM [facility] [place] [facility] [phone]   11/22/2012 10:45 AM Delcie Roch [place] [facility] [phone]   11/29/2012 10:45 AM [facility] [place] [facility] MEDICAL  ONCOLOGY [phone]   11/30/2012 3:00 PM [facility] Mr 1  IMAGING AT [address] [phone]   Patient to arrive 15 minutes prior to appointment time.   12/06/2012 8:30 AM Delcie Roch [place] [facility] ON[facility] [phone]   12/06/2012 9:00 AM [facility] [place] [facility] [phone]   12/13/2012 10:45 AM [facility] [place] [facility] [phone]   12/27/2012 8:15 AM [facility] [place] [facility] [phone]   Future Orders Complete By Expires     Diet - low sodium heart healthy  As directed     Diet - low sodium heart healthy  As directed     Increase activity slowly  As directed     Increase activity slowly  As directed         Medication List    TAKE these medications       benzonatate 200 MG capsule  Commonly known as:  TESSALON  Take 1 capsule (200 mg total) by mouth 3 (three) times daily.     chlorproMAZINE 25 MG tablet  Commonly known as:  THORAZINE  Take 25 mg by mouth 3 (three) times daily as needed (  for hiccups).     cholecalciferol 1000 UNITS tablet  Commonly known as:  VITAMIN D  Take 1,000 Units by mouth every morning.     dexamethasone 4 MG tablet  Commonly known as:  DECADRON  Take 4 mg by mouth 2 (two) times daily with a meal. Day before, day of and day after chemo.     feeding supplement Liqd  Take 237 mLs by mouth 2 (two) times daily between meals.     FLUoxetine 20 MG capsule  Commonly known as:  PROZAC  Take 1 capsule (20 mg total) by mouth daily.     folic acid 1 MG tablet  Commonly known as:  FOLVITE  Take 1 tablet (1 mg total) by mouth daily.     furosemide 20 MG tablet  Commonly known as:  LASIX  Take 1 tablet (20 mg total) by mouth daily.     hyaluronate sodium Gel  Apply 1 application topically 2 (two) times daily as needed (for radiation burns).     ipratropium 0.02 % nebulizer solution  Commonly known as:   ATROVENT  Take 2.5 mLs (0.5 mg total) by nebulization 4 (four) times daily as needed for wheezing.     levalbuterol 0.63 MG/3ML nebulizer solution  Commonly known as:  XOPENEX  Take 3 mLs (0.63 mg total) by nebulization every 4 (four) hours as needed for wheezing.     levofloxacin 500 MG tablet  Commonly known as:  LEVAQUIN  Take 1 tablet (500 mg total) by mouth daily.     multivitamin with minerals Tabs  Take 1 tablet by mouth every morning.     omeprazole 20 MG capsule  Commonly known as:  PRILOSEC  Take 20 mg by mouth daily.     oxyCODONE-acetaminophen 5-325 MG per tablet  Commonly known as:  PERCOCET/ROXICET  Take 1 tablet by mouth every 6 (six) hours as needed for pain.     PRESCRIPTION MEDICATION  Inject into the vein every 21 ( twenty-one) days. Alimta and Paraplatin infusion q21d.     rifampin 300 MG capsule  Commonly known as:  RIFADIN  Take 1 capsule (300 mg total) by mouth every 12 (twelve) hours.     sucralfate 1 G tablet  Commonly known as:  CARAFATE  Take 1 tablet (1 g total) by mouth 4 (four) times daily. 5 minutes before meals     Vitamin C 500 MG Caps  Take 1 capsule by mouth every morning.           Follow-up Information   Follow up with Cassell Smiles., MD In 2 weeks.   Contact information:   [address] [place] [address] [phone]       Follow up with Debbora Presto, MD.   Contact information:   [facility] [phone]       Follow up with [facility] Main Office [facility]). (Staff will call you on Monday or Tuesday for an appointmetn with cardiologist )    Contact information:   [address], [address] [place] [address] [phone]       The results of significant diagnostics from this hospitalization (including imaging, microbiology, ancillary and laboratory) are listed below for reference.     Microbiology: Recent Results (from the past 240 hour(s))  CULTURE, BLOOD (ROUTINE X 2)      Status: None   Collection Time    10/17/12 12:40 PM      Result Value Range Status   Specimen  Description BLOOD LEFT ARM  10 ML IN [facility] BOTTLE   Final   Special Requests Immunocompromised TAKEN AFTER 20 ML OF ANTIBIOTICS   Final   Culture  Setup Time 10/17/2012 20:04   Final   Culture     Final   Value:        BLOOD CULTURE RECEIVED NO GROWTH TO DATE CULTURE WILL BE HELD FOR 5 DAYS BEFORE ISSUING A FINAL NEGATIVE REPORT   Report Status PENDING   Incomplete  CULTURE, BLOOD (ROUTINE X 2)     Status: None   Collection Time    10/17/12 12:50 PM      Result Value Range Status   Specimen Description BLOOD RIGHT FOREARM  5.5 ML IN [facility] BOTTLE   Final   Special Requests Immunocompromised TAKEN AFTER 20 ML OF ANTIBIOTICS   Final   Culture  Setup Time 10/17/2012 20:04   Final   Culture     Final   Value:        BLOOD CULTURE RECEIVED NO GROWTH TO DATE CULTURE WILL BE HELD FOR 5 DAYS BEFORE ISSUING A FINAL NEGATIVE REPORT   Report Status PENDING   Incomplete  MRSA PCR SCREENING     Status: None   Collection Time    10/19/12  2:12 PM      Result Value Range Status   MRSA by PCR NEGATIVE  NEGATIVE Final   Comment:            The GeneXpert MRSA Assay (FDA     approved for NASAL specimens     only), is one component of a     comprehensive MRSA colonization     surveillance program. It is not     intended to diagnose MRSA     infection nor to guide or     monitor treatment for     MRSA infections.     Labs: Basic Metabolic Panel:  Recent Labs Lab 10/18/12 0500 10/19/12 0440 10/20/12 0325 10/21/12 0428 10/22/12 0448  NA 132* 133* 136 137 137  K 3.3* 3.6 3.0* 3.0* 4.2  CL 99 102 100 99 98  CO2 24 24 28 31  32  GLUCOSE 109* 106* 108* 97 102*  BUN 31* 30* 31* 29* 26*  CREATININE 1.22 1.00 0.99 0.92 0.80  CALCIUM 8.7 8.9 9.1 9.1 9.3  MG 1.9  --  1.9  --   --   PHOS  --   --  4.7*  --   --    Liver Function Tests:  Recent Labs Lab 10/17/12 1045 10/18/12 0500  AST 44* 40*   ALT 42 34  ALKPHOS 155* 128*  BILITOT 0.6 0.9  PROT 6.8 5.5*  ALBUMIN 2.1* 1.6*   CBC:  Recent Labs Lab 10/17/12 1045 10/18/12 0500 10/19/12 0440 10/20/12 0325 10/21/12 0428  WBC 2.4* 2.4* 4.7 5.1 5.0  NEUTROABS 2.2 2.2  --   --   --   HGB 8.0* 7.8* 8.0* 7.9* 7.9*  HCT 22.9* 22.4* 22.5* 22.8* 22.4*  MCV 86.7 85.5 87.2 86.7 87.2  PLT 34* 46* 89* 126* 170   Cardiac Enzymes:  Recent Labs Lab 10/17/12 1045  TROPONINI <0.30   SIGNED: Time coordinating discharge: Over 30 minutes  Debbora Presto, MD  [facility] 10/22/2012, 12:02 PM Pager [phone]  If 7PM-7AM, please contact night-coverage [email or web address] Password [email or web address]

## 2012-10-23 LAB — CULTURE, BLOOD (ROUTINE X 2): Culture: NO GROWTH

## 2012-10-24 LAB — LEGIONELLA ANTIGEN, URINE: Legionella Antigen, Urine: POSITIVE

## 2012-10-25 ENCOUNTER — Ambulatory Visit: Payer: Medicare Other

## 2012-10-25 ENCOUNTER — Other Ambulatory Visit: Payer: Medicare Other | Admitting: Lab

## 2012-10-25 ENCOUNTER — Ambulatory Visit: Payer: Medicare Other | Admitting: Internal Medicine

## 2012-10-26 ENCOUNTER — Other Ambulatory Visit: Payer: Medicare Other

## 2012-10-28 ENCOUNTER — Encounter: Payer: Self-pay | Admitting: Internal Medicine

## 2012-10-28 ENCOUNTER — Ambulatory Visit (INDEPENDENT_AMBULATORY_CARE_PROVIDER_SITE_OTHER): Payer: Medicare Other | Admitting: Internal Medicine

## 2012-10-28 ENCOUNTER — Other Ambulatory Visit: Payer: Self-pay | Admitting: *Deleted

## 2012-10-28 VITALS — BP 100/59 | HR 112 | Temp 98.5°F | Ht 65.0 in | Wt 118.0 lb

## 2012-10-28 DIAGNOSIS — J189 Pneumonia, unspecified organism: Secondary | ICD-10-CM

## 2012-10-28 NOTE — Assessment & Plan Note (Addendum)
He has about completed his therapy for Legionella pneumonia.  I do feel that he will have been treated adequately at the end of his therapy and therefore can stop his antibiotic. He will followup with his primary physician regarding his oxygen. He can return to infectious disease as needed.

## 2012-10-28 NOTE — Progress Notes (Signed)
  Subjective:    Patient ID: Luis Payne, male    DOB: [birth year], 73 y.o.   MRN: [id number]  HPI He comes here for hospital followup. He was seen about 2 weeks ago for weakness, fever and increased cough. He does have a history of adenocarcinoma of the lung with that is metastatic. In the hospitalization he was noted to have pneumonia and did have a positive urine Legionella antibody. He was started on appropriate therapy with Levaquin and then rifampin and his close to completing his two-week course of therapy. She continues to be on oxygen, which is new for him, but otherwise overall is much better than when he went into the hospital. He does continue to be weak. He thinks he got the Legionella from the water fountain at [place], as well water has been tested as he has been visited by the health department. No fever or chills.   Review of Systems  Constitutional: Positive for fatigue. Negative for fever and chills.  Respiratory: Positive for cough and shortness of breath.   Cardiovascular: Negative for chest pain and leg swelling.  Gastrointestinal: Negative for nausea and diarrhea.  Skin: Negative for rash.  Neurological: Negative for dizziness and headaches.       Objective:   Physical Exam  Constitutional: No distress.  Chronically ill-appearing and cachectic  HENT:  Mouth/Throat: No oropharyngeal exudate.  Eyes: No scleral icterus.  Cardiovascular: Normal rate, regular rhythm and normal heart sounds.   No murmur heard. Pulmonary/Chest: Effort normal and breath sounds normal. No respiratory distress.  Distant breath sounds  Lymphadenopathy:    He has no cervical adenopathy.  Skin: No rash noted.          Assessment & Plan:

## 2012-10-29 ENCOUNTER — Telehealth: Payer: Self-pay | Admitting: Dietician

## 2012-10-29 NOTE — Telephone Encounter (Signed)
Brief Outpatient Oncology Nutrition Note  Patient has been identified to be at risk on malnutrition screen.  Wt Readings from Last 10 Encounters:  10/28/12 118 lb (53.524 kg)  10/22/12 128 lb 14.4 oz (58.469 kg)  10/13/12 122 lb 11.2 oz (55.656 kg)  10/08/12 129 lb 11.2 oz (58.832 kg)  10/05/12 124 lb 14.4 oz (56.654 kg)  10/01/12 128 lb (58.06 kg)  09/27/12 125 lb 11.2 oz (57.017 kg)  09/24/12 125 lb 12.8 oz (57.063 kg)  09/21/12 124 lb 6.4 oz (56.427 kg)  09/17/12 128 lb 6.4 oz (58.242 kg)    Called patient regarding weight loss.  Patient with metastatic non-small cell lung cancer, adenocarcinoma.  Patient with recent hospitalization.  Weight was 134 lbs at that time.    Spoke with wife who reported hospitalized weight increased with fluid and usually has been 122 lbs recently and now decreased to 120 lbs on home scale.  Patient is usually a light eater and decreased further with current illness.  Difficulty swallowing improving and patient starting to eat soft solid foods and is drinking Ensure although this remains inadequate.  Wife having difficulty getting patient to eat and drink.  Will mail patient coupons for Ensure and tips to increase calories and protein along with contact information for [facility].  Oran Rein, RD, LDN

## 2012-11-01 ENCOUNTER — Other Ambulatory Visit (HOSPITAL_BASED_OUTPATIENT_CLINIC_OR_DEPARTMENT_OTHER): Payer: Medicare Other

## 2012-11-01 ENCOUNTER — Other Ambulatory Visit: Payer: Self-pay | Admitting: Medical Oncology

## 2012-11-01 DIAGNOSIS — C3410 Malignant neoplasm of upper lobe, unspecified bronchus or lung: Secondary | ICD-10-CM

## 2012-11-01 DIAGNOSIS — C7931 Secondary malignant neoplasm of brain: Secondary | ICD-10-CM

## 2012-11-01 LAB — CBC WITH DIFFERENTIAL/PLATELET
Basophils Absolute: 0 10*3/uL (ref 0.0–0.1)
Eosinophils Absolute: 0 10*3/uL (ref 0.0–0.5)
HGB: 9.2 g/dL — ABNORMAL LOW (ref 13.0–17.1)
MCV: 93.2 fL (ref 79.3–98.0)
MONO#: 1 10*3/uL — ABNORMAL HIGH (ref 0.1–0.9)
NEUT#: 7.2 10*3/uL — ABNORMAL HIGH (ref 1.5–6.5)
RBC: 2.81 10*6/uL — ABNORMAL LOW (ref 4.20–5.82)
RDW: 18.6 % — ABNORMAL HIGH (ref 11.0–14.6)
WBC: 8.6 10*3/uL (ref 4.0–10.3)
lymph#: 0.3 10*3/uL — ABNORMAL LOW (ref 0.9–3.3)

## 2012-11-01 LAB — COMPREHENSIVE METABOLIC PANEL (CC13)
ALT: 16 U/L (ref 0–55)
Albumin: 1.7 g/dL — ABNORMAL LOW (ref 3.5–5.0)
CO2: 33 mEq/L — ABNORMAL HIGH (ref 22–29)
Chloride: 94 mEq/L — ABNORMAL LOW (ref 98–107)
Glucose: 124 mg/dl — ABNORMAL HIGH (ref 70–99)
Potassium: 4.2 mEq/L (ref 3.5–5.1)
Sodium: 136 mEq/L (ref 136–145)
Total Protein: 7.2 g/dL (ref 6.4–8.3)

## 2012-11-01 LAB — HOLD TUBE, BLOOD BANK

## 2012-11-01 LAB — RESEARCH LABS

## 2012-11-01 NOTE — Progress Notes (Signed)
Labs reveiwed with pt. And wife.

## 2012-11-02 ENCOUNTER — Other Ambulatory Visit: Payer: Medicare Other

## 2012-11-02 ENCOUNTER — Telehealth: Payer: Self-pay | Admitting: Medical Oncology

## 2012-11-02 DIAGNOSIS — J189 Pneumonia, unspecified organism: Secondary | ICD-10-CM

## 2012-11-02 MED ORDER — LEVALBUTEROL HCL 0.63 MG/3ML IN NEBU: 0.6300 mg | INHALATION_SOLUTION | RESPIRATORY_TRACT | Status: DC | PRN

## 2012-11-02 NOTE — Telephone Encounter (Signed)
Per Dr Arbutus Ped okay to call in one month refill for Levalbuterol to use prn wheezing. I called in refill and Wife notified.

## 2012-11-04 ENCOUNTER — Telehealth: Payer: Self-pay | Admitting: Medical Oncology

## 2012-11-04 NOTE — Telephone Encounter (Signed)
Pt recevied levabuterol inhaler

## 2012-11-08 ENCOUNTER — Other Ambulatory Visit (HOSPITAL_BASED_OUTPATIENT_CLINIC_OR_DEPARTMENT_OTHER): Payer: Medicare Other | Admitting: Lab

## 2012-11-08 DIAGNOSIS — C3410 Malignant neoplasm of upper lobe, unspecified bronchus or lung: Secondary | ICD-10-CM

## 2012-11-08 LAB — COMPREHENSIVE METABOLIC PANEL (CC13)
ALT: 16 U/L (ref 0–55)
AST: 25 U/L (ref 5–34)
CO2: 34 mEq/L — ABNORMAL HIGH (ref 22–29)
Chloride: 95 mEq/L — ABNORMAL LOW (ref 98–109)
Creatinine: 0.9 mg/dL (ref 0.7–1.3)
Sodium: 135 mEq/L — ABNORMAL LOW (ref 136–145)
Total Bilirubin: 0.48 mg/dL (ref 0.20–1.20)
Total Protein: 7.8 g/dL (ref 6.4–8.3)

## 2012-11-08 LAB — CBC WITH DIFFERENTIAL/PLATELET
BASO%: 0.5 % (ref 0.0–2.0)
EOS%: 1.1 % (ref 0.0–7.0)
HCT: 25.7 % — ABNORMAL LOW (ref 38.4–49.9)
LYMPH%: 5.9 % — ABNORMAL LOW (ref 14.0–49.0)
MCH: 33 pg (ref 27.2–33.4)
MCHC: 35.6 g/dL (ref 32.0–36.0)
MONO#: 1.1 10*3/uL — ABNORMAL HIGH (ref 0.1–0.9)
NEUT%: 81.4 % — ABNORMAL HIGH (ref 39.0–75.0)
RBC: 2.77 10*6/uL — ABNORMAL LOW (ref 4.20–5.82)
WBC: 10.1 10*3/uL (ref 4.0–10.3)
lymph#: 0.6 10*3/uL — ABNORMAL LOW (ref 0.9–3.3)

## 2012-11-09 ENCOUNTER — Other Ambulatory Visit: Payer: Self-pay | Admitting: *Deleted

## 2012-11-09 ENCOUNTER — Other Ambulatory Visit: Payer: Medicare Other

## 2012-11-15 ENCOUNTER — Ambulatory Visit: Payer: Medicare Other

## 2012-11-15 ENCOUNTER — Other Ambulatory Visit: Payer: Self-pay | Admitting: Internal Medicine

## 2012-11-15 ENCOUNTER — Telehealth: Payer: Self-pay | Admitting: Internal Medicine

## 2012-11-15 ENCOUNTER — Other Ambulatory Visit (HOSPITAL_BASED_OUTPATIENT_CLINIC_OR_DEPARTMENT_OTHER): Payer: Medicare Other | Admitting: Lab

## 2012-11-15 ENCOUNTER — Encounter: Payer: Medicare Other | Admitting: *Deleted

## 2012-11-15 DIAGNOSIS — C3490 Malignant neoplasm of unspecified part of unspecified bronchus or lung: Secondary | ICD-10-CM

## 2012-11-15 LAB — CBC WITH DIFFERENTIAL/PLATELET
BASO%: 0.6 % (ref 0.0–2.0)
EOS%: 1.5 % (ref 0.0–7.0)
HCT: 28 % — ABNORMAL LOW (ref 38.4–49.9)
LYMPH%: 12.8 % — ABNORMAL LOW (ref 14.0–49.0)
MCH: 32.8 pg (ref 27.2–33.4)
MCHC: 34.4 g/dL (ref 32.0–36.0)
MONO%: 10.9 % (ref 0.0–14.0)
NEUT%: 74.2 % (ref 39.0–75.0)
Platelets: 408 10*3/uL — ABNORMAL HIGH (ref 140–400)
RBC: 2.94 10*6/uL — ABNORMAL LOW (ref 4.20–5.82)
WBC: 7 10*3/uL (ref 4.0–10.3)

## 2012-11-15 LAB — COMPREHENSIVE METABOLIC PANEL (CC13)
ALT: 29 U/L (ref 0–55)
AST: 36 U/L — ABNORMAL HIGH (ref 5–34)
Alkaline Phosphatase: 107 U/L (ref 40–150)
Creatinine: 0.9 mg/dL (ref 0.7–1.3)
Sodium: 135 mEq/L — ABNORMAL LOW (ref 136–145)
Total Bilirubin: 0.41 mg/dL (ref 0.20–1.20)
Total Protein: 7.5 g/dL (ref 6.4–8.3)

## 2012-11-15 NOTE — Progress Notes (Signed)
Pt came in to office for scheduled chemo treatment, states he is not to receive chemo today. Has not seen MD since being discharged from hospital in June. Reviewed with Dr. Arbutus Ped, order received to schedule pt to see PA next week. Orders entered, pt taken to scheduling.

## 2012-11-15 NOTE — Telephone Encounter (Signed)
Pt came by and gave him appt for ML appt for 7/16 with labs

## 2012-11-15 NOTE — Progress Notes (Signed)
11/15/2012 Patient in to clinic today for lab and treatment appointments, however, per patient, he is not receiving chemo today. States he has not seen medical oncologist since his hospitalization last month for pneumonia, and subsequent development of CHF. Study assessments (research blood samples and PRO collection) anticipated for Day 1 of cycle today will be cancelled until treatment resumes. Encouraged patient to contact office if he does not receive notification today regarding his next follow-up appointment with Dr. Arbutus Ped. Cindy S. Clelia Croft BSN, RN, CCRP 11/15/2012 8:12 AM  11/16/2012 Per Dr. Asa Lente nurse, patient has been rescheduled for medical oncology evaluation. Next appointment has been set for 11/24/2012, therefore research samples (visit 2) and PRO will be collected at that time. Cindy S. Clelia Croft BSN, RN, CCRP 11/16/2012 10:10 AM

## 2012-11-16 ENCOUNTER — Other Ambulatory Visit: Payer: Medicare Other

## 2012-11-17 ENCOUNTER — Telehealth: Payer: Self-pay | Admitting: Internal Medicine

## 2012-11-17 ENCOUNTER — Telehealth: Payer: Self-pay | Admitting: *Deleted

## 2012-11-17 NOTE — Telephone Encounter (Signed)
gave pt appt for lab every month for 6 months and see MD on Octotober 2014

## 2012-11-17 NOTE — Telephone Encounter (Signed)
Per staff phone call and POF I have schedueld appts.  JMW  

## 2012-11-18 ENCOUNTER — Telehealth: Payer: Self-pay | Admitting: Internal Medicine

## 2012-11-18 NOTE — Telephone Encounter (Signed)
Wife called today re phone tree message for 7/14 appt. appt for 7/14 was tx that had not yet been moved to 7/16 along w/lb and fu. tx moved to 7/16 w/lb and f/u wife aware and has appt d/t for 7/16 @ lb/fu/tx @ 8:45am. appts for 7/14 scheduled for 7/16 due to APP @ [facility] 7/14 and 7/15. Per MM 7/16 for appts ok. See 7/9 note.

## 2012-11-22 ENCOUNTER — Other Ambulatory Visit: Payer: Medicare Other | Admitting: Lab

## 2012-11-22 ENCOUNTER — Encounter: Payer: Self-pay | Admitting: Cardiovascular Disease

## 2012-11-22 ENCOUNTER — Ambulatory Visit: Payer: Medicare Other

## 2012-11-22 ENCOUNTER — Ambulatory Visit (INDEPENDENT_AMBULATORY_CARE_PROVIDER_SITE_OTHER): Payer: Medicare Other | Admitting: Cardiovascular Disease

## 2012-11-22 VITALS — BP 114/72 | HR 102 | Ht 65.0 in | Wt 122.0 lb

## 2012-11-22 DIAGNOSIS — I5022 Chronic systolic (congestive) heart failure: Secondary | ICD-10-CM | POA: Insufficient documentation

## 2012-11-22 DIAGNOSIS — C3410 Malignant neoplasm of upper lobe, unspecified bronchus or lung: Secondary | ICD-10-CM

## 2012-11-22 DIAGNOSIS — R918 Other nonspecific abnormal finding of lung field: Secondary | ICD-10-CM

## 2012-11-22 DIAGNOSIS — R222 Localized swelling, mass and lump, trunk: Secondary | ICD-10-CM

## 2012-11-22 DIAGNOSIS — I509 Heart failure, unspecified: Secondary | ICD-10-CM

## 2012-11-22 MED ORDER — CARVEDILOL 3.125 MG PO TABS: 3.1250 mg | ORAL_TABLET | Freq: Two times a day (BID) | ORAL | Status: DC

## 2012-11-22 NOTE — Assessment & Plan Note (Addendum)
Represents today for further evaluation and management of his chronic systolic congestive heart failure. He is a 72 year old gentleman with metastatic lung cancer. He was hospitalized in June and was found to have legionnaires disease. He was also found to have moderate to severely depressed left ventricular systolic function at that time. He was started on Lasix 20 mg a day. He's feeling a little bit better but still remains very short of breath. He has PND and orthopnea. He is tachycardic on exam today.  We had long discussion about treatment of congestive heart failure. I discussed the fact that we'll be gradually titrating up beta blockers. We will start him on carvedilol 3.125 mg twice a day. He will return to see our nurse practitioner in 3-4 weeks for followup visit. I'll see him again in 2 months.  Discussed the possible etiologies of congestive heart failure.  Given the fact that he has metastatic cancer and that he has not had any angina, I do not plan a cath at this time.  We will see if he makes progress with medical therapy.   i have discussed this in great detail and he and his wife understand.

## 2012-11-22 NOTE — Progress Notes (Signed)
Luis Payne Date of Birth  1940-07-29       [facility]    [facility] [address], [address]  [address] [place], [place]  [address]   [place], [place]  [address] [phone]     [phone]   Fax  [phone]    Fax [phone]  Problem List: 1. Lung Cancer - mets to the brain.  2. Legionella pneumonia 3. CHF Left ventricle: The cavity size was normal. Wall thickness was normal. Systolic function was moderately to severely reduced. The estimated ejection fraction was in the range of 30% to 35%. Diffuse hypokinesis. The study is not technically sufficient to allow evaluation of LV diastolic function. - Aortic valve: Mild to moderate regurgitation directed centrally in the LVOT   History of Present Illness:  Luis Payne is a 72 yo - was diagnosed with lung CA July 23, 2012.   He quit smoking this past year.   He's had radiation therapy- 25 treatments of XRT. He's also had chemotherapy. He had Legionella pneumonia in June of this past year. It was during that hospitalization that an echocardiogram was performed and revealed moderately depressed left ventricular systolic function with an ejection fraction of 30-35%.   He ha had + PND and orthopnea- he has been sleeping in a recliner for the past 3 weeks. She had any leg swelling. He generally stays with him salty foods.  He is retired for [employer].  He restores cars as a hobby.      Current Outpatient Prescriptions on File Prior to Visit  Medication Sig Dispense Refill  . benzonatate (TESSALON) 200 MG capsule Take 1 capsule (200 mg total) by mouth 3 (three) times daily.  20 capsule  0  . chlorproMAZINE (THORAZINE) 25 MG tablet Take 25 mg by mouth 3 (three) times daily as needed (for hiccups).      . cholecalciferol (VITAMIN D) 1000 UNITS tablet Take 1,000 Units by mouth every morning.      Marland Kitchen dexamethasone (DECADRON) 4 MG tablet Take 4 mg by mouth 2 (two) times daily with a meal. Day  before, day of and day after chemo.      . feeding supplement (ENSURE COMPLETE) LIQD Take 237 mLs by mouth 2 (two) times daily between meals.  30 Bottle  5  . folic acid (FOLVITE) 1 MG tablet Take 1 tablet (1 mg total) by mouth daily.  30 tablet  4  . furosemide (LASIX) 20 MG tablet Take 1 tablet (20 mg total) by mouth daily.  30 tablet  3  . hyaluronate sodium (RADIAPLEXRX) GEL Apply 1 application topically 2 (two) times daily as needed (for radiation burns).       Marland Kitchen ipratropium (ATROVENT) 0.02 % nebulizer solution Take 2.5 mLs (0.5 mg total) by nebulization 4 (four) times daily as needed for wheezing.  75 mL  12  . levalbuterol (XOPENEX) 0.63 MG/3ML nebulizer solution Take 3 mLs (0.63 mg total) by nebulization every 4 (four) hours as needed for wheezing.  3 mL  120  . levofloxacin (LEVAQUIN) 500 MG tablet Take 1 tablet (500 mg total) by mouth daily.  14 tablet  0  . Multiple Vitamin (MULTIVITAMIN WITH MINERALS) TABS Take 1 tablet by mouth every morning.      Marland Kitchen oxyCODONE-acetaminophen (PERCOCET/ROXICET) 5-325 MG per tablet Take 1 tablet by mouth every 6 (six) hours as needed for pain.  90 tablet  0  . PRESCRIPTION MEDICATION Inject into the  vein every 21 ( twenty-one) days. Alimta and Paraplatin infusion q21d.      . rifampin (RIFADIN) 300 MG capsule Take 1 capsule (300 mg total) by mouth every 12 (twelve) hours.  28 capsule  0   No current facility-administered medications on file prior to visit.    No Known Allergies  Past Medical History  Diagnosis Date  . Substance abuse     TOBACCO  . Lung mass   . 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 Brain metastases 08/18/12    mri-  . HOH (hard of hearing)     wears hearing aids  . COPD (chronic obstructive pulmonary disease)     Past Surgical History  Procedure Laterality Date  . Wrist surgery    . Lung biopsy Left 08/17/12    LUL lung mass-adenocarcinoma    History  Smoking status  . Former Smoker -- 1.00 packs/day  . Types:  Cigarettes  . Quit date: 09/28/2012  Smokeless tobacco  . Never Used    Comment: TRING TO QUIT...DOWN TO 1 PACK PER WEEK    History  Alcohol Use No    Family History  Problem Relation Age of Onset  . COPD Father   . Cancer Sister   . Diabetes Brother     Reviw of Systems:  Reviewed in the HPI.  All other systems are negative.  Physical Exam: Blood pressure 114/72, pulse 102, height 5\' 5"  (1.651 m), weight 122 lb (55.339 kg). General: Well developed, well nourished, in no acute distress.  Head: Normocephalic, atraumatic, sclera non-icteric, mucus membranes are moist,   Neck: Supple. Carotids are 2 + without bruits. No JVD   Lungs: few basilar rales  Heart: RR, S1,S2, no S3  Abdomen: Soft, non-tender, non-distended with normal bowel sounds.  Msk:  Strength and tone are normal   Extremities: No clubbing or cyanosis. No edema.  Distal pedal pulses are 2+ and equal    Neuro: CN II - XII intact.  Alert and oriented X 3.   Psych:  Normal   ECG: November 22, 2012:  Sinus tach with PVCs. Voltage criteria for LVH, NS ST abnormality.  Assessment / Plan:

## 2012-11-22 NOTE — Patient Instructions (Addendum)
Your physician has recommended you make the following change in your medication:  START CARVEDILOL/ coreg  3.125 MG TWICE DAILY 12 HOURS APART  Your physician recommends that you schedule a follow-up appointment in:  3-4 WEEKS WITH Norma Fredrickson NP/// needs ekg  2 months with Dr Elease Hashimoto

## 2012-11-23 ENCOUNTER — Other Ambulatory Visit: Payer: Medicare Other

## 2012-11-24 ENCOUNTER — Encounter: Payer: Self-pay | Admitting: Physician Assistant

## 2012-11-24 ENCOUNTER — Other Ambulatory Visit (HOSPITAL_BASED_OUTPATIENT_CLINIC_OR_DEPARTMENT_OTHER): Payer: Medicare Other | Admitting: Lab

## 2012-11-24 ENCOUNTER — Ambulatory Visit (HOSPITAL_BASED_OUTPATIENT_CLINIC_OR_DEPARTMENT_OTHER): Payer: Medicare Other

## 2012-11-24 ENCOUNTER — Ambulatory Visit (HOSPITAL_BASED_OUTPATIENT_CLINIC_OR_DEPARTMENT_OTHER): Payer: Medicare Other | Admitting: Physician Assistant

## 2012-11-24 ENCOUNTER — Telehealth: Payer: Self-pay | Admitting: Internal Medicine

## 2012-11-24 ENCOUNTER — Telehealth: Payer: Self-pay | Admitting: *Deleted

## 2012-11-24 ENCOUNTER — Encounter: Payer: Medicare Other | Admitting: *Deleted

## 2012-11-24 DIAGNOSIS — C7931 Secondary malignant neoplasm of brain: Secondary | ICD-10-CM

## 2012-11-24 DIAGNOSIS — Z5111 Encounter for antineoplastic chemotherapy: Secondary | ICD-10-CM

## 2012-11-24 DIAGNOSIS — C3410 Malignant neoplasm of upper lobe, unspecified bronchus or lung: Secondary | ICD-10-CM

## 2012-11-24 LAB — CBC WITH DIFFERENTIAL/PLATELET
BASO%: 0.4 % (ref 0.0–2.0)
HCT: 30.6 % — ABNORMAL LOW (ref 38.4–49.9)
LYMPH%: 14.4 % (ref 14.0–49.0)
MCHC: 33.8 g/dL (ref 32.0–36.0)
MCV: 95.6 fL (ref 79.3–98.0)
MONO#: 0.7 10*3/uL (ref 0.1–0.9)
MONO%: 12.6 % (ref 0.0–14.0)
NEUT%: 72.4 % (ref 39.0–75.0)
Platelets: 379 10*3/uL (ref 140–400)
WBC: 5.7 10*3/uL (ref 4.0–10.3)

## 2012-11-24 LAB — COMPREHENSIVE METABOLIC PANEL (CC13)
ALT: 22 U/L (ref 0–55)
Alkaline Phosphatase: 99 U/L (ref 40–150)
CO2: 30 mEq/L — ABNORMAL HIGH (ref 22–29)
Creatinine: 1.1 mg/dL (ref 0.7–1.3)
Glucose: 121 mg/dl (ref 70–140)
Total Bilirubin: 0.37 mg/dL (ref 0.20–1.20)

## 2012-11-24 LAB — RESEARCH LABS

## 2012-11-24 MED ORDER — DEXAMETHASONE SODIUM PHOSPHATE 20 MG/5ML IJ SOLN
20.0000 mg | Freq: Once | INTRAMUSCULAR | Status: AC
  Administered 2012-11-24: 20 mg via INTRAVENOUS

## 2012-11-24 MED ORDER — SODIUM CHLORIDE 0.9 % IV SOLN
Freq: Once | INTRAVENOUS | Status: AC
  Administered 2012-11-24: 11:00:00 via INTRAVENOUS

## 2012-11-24 MED ORDER — SODIUM CHLORIDE 0.9 % IV SOLN
500.0000 mg/m2 | Freq: Once | INTRAVENOUS | Status: AC
  Administered 2012-11-24: 800 mg via INTRAVENOUS
  Filled 2012-11-24: qty 32

## 2012-11-24 MED ORDER — CYANOCOBALAMIN 1000 MCG/ML IJ SOLN
1000.0000 ug | Freq: Once | INTRAMUSCULAR | Status: AC
  Administered 2012-11-24: 1000 ug via INTRAMUSCULAR

## 2012-11-24 MED ORDER — SODIUM CHLORIDE 0.9 % IV SOLN
390.0000 mg | Freq: Once | INTRAVENOUS | Status: AC
  Administered 2012-11-24: 390 mg via INTRAVENOUS
  Filled 2012-11-24: qty 39

## 2012-11-24 MED ORDER — ONDANSETRON 16 MG/50ML IVPB (CHCC)
16.0000 mg | Freq: Once | INTRAVENOUS | Status: AC
  Administered 2012-11-24: 16 mg via INTRAVENOUS

## 2012-11-24 NOTE — Patient Instructions (Addendum)
Continue with weekly labs as scheduled Followup with Dr. Arbutus Ped in 3 weeks with a restaging CT scan of your chest, abdomen and pelvis to reevaluate your disease, prior to your  next scheduled cycle of chemotherapy

## 2012-11-24 NOTE — Telephone Encounter (Signed)
gv and printed appt sched and avs forlpt.... MW added tx...  °

## 2012-11-24 NOTE — Progress Notes (Signed)
11/24/2012 Patient in to clinic today for scheduled visit. Due to hospitalization, concurrent medical conditions and delays/cancellations of treatment, patient is unsure if he will be receiving treatment today. Research blood samples for visit 2, expected to be collected on 11/15/2012 (anticipated Cycle 3, Day 1), were collected today instead, at the patient's next scheduled lab draw. Following the patient's lab appointment, the patient completed the Patient Reported Outcomes (PROs), at the time point of 4+2 weeks from the baseline assessment, within protocol guidelines, with basic instruction review, including the EQ-5D-3L followed by the lung cancer symptom scale (LCSS), prior to the MD appointment. Upon completion, patient identifiers were added to the EQ-5D-3L questionnaire. Thanked patient for his participation, and noted that I will continue to follow his progress and upcoming appointments (observation vs. active treatment) to determine the next interval for collection of blood samples and questionnaires. Cindy S. Clelia Croft BSN, RN, CCRP 11/24/2012 9:47 AM

## 2012-11-24 NOTE — Telephone Encounter (Signed)
Per staff phone call and POF I have schedueld appts.  JMW  

## 2012-11-24 NOTE — Progress Notes (Addendum)
[facility] [phone]   [phone]  SHARED VISIT PROGRESS NOTE  Cassell Smiles., MD [address] [place] [address]  DIAGNOSIS: Metastatic non-small cell lung cancer, adenocarcinoma with negative EGFR mutation and negative ALK gene translocation diagnosed in April 2014   PRIOR THERAPY:  1. Status post stereotactic radiotherapy to 2 brain lesions under the care of Dr. Kathrynn Running.  2. Status post palliative radiotherapy to the left lung mass.   CURRENT THERAPY:   Systemic chemotherapy with carboplatin for AUC of 5 and Alimta 500 mg/M2 every 3 weeks, status post 2 cycles. Last given on May, 27, 2014.   INTERVAL HISTORY: Luis Payne 72 y.o. male returns to the clinic today for followup visit accompanied his wife. His chemotherapy has been on hold due to a hospitalization for legionella. He was discharged on a 10 day course of Levaquin and rifampin. He completed those medications. He was also discharged on oxygen at 4 L via nasal cannula. He saw his primary care physician recently, Dr. Sherwood Gambler, who decreased his oxygen to 2 L per minute via nasal cannula. He was also evaluated by Dr. Elease Hashimoto for his congestive heart failure with a low ejection fraction of between 30 and 35%. He was started on Coreg with plans for this medication to be titrated up. Patient was told that he was not a surgical candidate for bypass at this time. He also reports he had a small amount of lower extremity edema, worse on the right than on the left, but this resolved with diuretics. His appetite is slowly improving. He is currently drinking 3 boost supplemental drinks daily and eating some solid foods. His wife states that he is eating half of what he would normally. Patient states that food just does not taste good. He reports that his cough has significantly improved. He is cough is now only occasionally productive of clear secretions and he denies any fever or  shaking chills. He no longer uses the Atrovent neb treatment and is currently only using the Xopenex neb treatment once daily. He occasionally may get chilled but feels that this is due to the air conditioning and he will put on a sweater with resolution.  The patient denied having any significant nausea or vomiting. He denied having any significant weight loss or night sweats. He has no chest pain, shortness breath,or hemoptysis. He continues to have some fatigue but is gradually getting stronger.  MEDICAL HISTORY: Past Medical History  Diagnosis Date  . Substance abuse     TOBACCO  . Lung mass   . 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 Brain metastases 08/18/12    mri-  . HOH (hard of hearing)     wears hearing aids  . COPD (chronic obstructive pulmonary disease)     ALLERGIES:  has No Known Allergies.  MEDICATIONS:  Current Outpatient Prescriptions  Medication Sig Dispense Refill  . benzonatate (TESSALON) 200 MG capsule Take 1 capsule (200 mg total) by mouth 3 (three) times daily.  20 capsule  0  . carvedilol (COREG) 3.125 MG tablet Take 1 tablet (3.125 mg total) by mouth 2 (two) times daily.  60 tablet  6  . chlorproMAZINE (THORAZINE) 25 MG tablet Take 25 mg by mouth 3 (three) times daily as needed (for hiccups).      . cholecalciferol (VITAMIN D) 1000 UNITS tablet Take 1,000 Units by mouth every morning.      Marland Kitchen  dexamethasone (DECADRON) 4 MG tablet Take 4 mg by mouth 2 (two) times daily with a meal. Day before, day of and day after chemo.      . feeding supplement (ENSURE COMPLETE) LIQD Take 237 mLs by mouth 2 (two) times daily between meals.  30 Bottle  5  . folic acid (FOLVITE) 1 MG tablet Take 1 tablet (1 mg total) by mouth daily.  30 tablet  4  . furosemide (LASIX) 20 MG tablet Take 1 tablet (20 mg total) by mouth daily.  30 tablet  3  . hyaluronate sodium (RADIAPLEXRX) GEL Apply 1 application topically 2 (two) times daily as needed (for radiation burns).       Marland Kitchen  ipratropium (ATROVENT) 0.02 % nebulizer solution Take 2.5 mLs (0.5 mg total) by nebulization 4 (four) times daily as needed for wheezing.  75 mL  12  . levalbuterol (XOPENEX) 0.63 MG/3ML nebulizer solution Take 3 mLs (0.63 mg total) by nebulization every 4 (four) hours as needed for wheezing.  3 mL  120  . Multiple Vitamin (MULTIVITAMIN WITH MINERALS) TABS Take 1 tablet by mouth every morning.      Marland Kitchen oxyCODONE-acetaminophen (PERCOCET/ROXICET) 5-325 MG per tablet Take 1 tablet by mouth every 6 (six) hours as needed for pain.  90 tablet  0  . PRESCRIPTION MEDICATION Inject into the vein every 21 ( twenty-one) days. Alimta and Paraplatin infusion q21d.       No current facility-administered medications for this visit.   Facility-Administered Medications Ordered in Other Visits  Medication Dose Route Frequency Provider Last Rate Last Dose  . 0.9 %  sodium chloride infusion   Intravenous Once Si Gaul, MD      . cyanocobalamin ((VITAMIN B-12)) injection 1,000 mcg  1,000 mcg Intramuscular Once Si Gaul, MD      . Dexamethasone Sodium Phosphate (DECADRON) injection 20 mg  20 mg Intravenous Once Si Gaul, MD      . ondansetron (ZOFRAN) IVPB 16 mg  16 mg Intravenous Once Si Gaul, MD        SURGICAL HISTORY:  Past Surgical History  Procedure Laterality Date  . Wrist surgery    . Lung biopsy Left 08/17/12    LUL lung mass-adenocarcinoma    REVIEW OF SYSTEMS:  A comprehensive review of systems was negative except for: Constitutional: positive for fatigue Respiratory: positive for cough   PHYSICAL EXAMINATION: General appearance: alert, cooperative, no distress and Patient is wearing Oxgen via nasal cannula at 2 L/min and is breathing comfortably Head: Normocephalic, without obvious abnormality, atraumatic Neck: no adenopathy Lymph nodes: Cervical, supraclavicular, and axillary nodes normal. Resp: clear to auscultation bilaterally and But generally decreased  throughout Cardio: regular rate and rhythm, S1, S2 normal, no murmur, click, rub or gallop GI: soft, non-tender; bowel sounds normal; no masses,  no organomegaly Extremities: extremities normal, atraumatic, no cyanosis or edema Neurologic: Alert and oriented X 3, normal strength and tone. Normal symmetric reflexes. Normal coordination and gait  ECOG PERFORMANCE STATUS: 1 - Symptomatic but completely ambulatory  Blood pressure 113/52, pulse 61, temperature 97 F (36.1 C), resp. rate 20, height 5\' 5"  (1.651 m), weight 122 lb 4.8 oz (55.475 kg).  LABORATORY DATA: Lab Results  Component Value Date   WBC 5.7 11/24/2012   HGB 10.3* 11/24/2012   HCT 30.6* 11/24/2012   MCV 95.6 11/24/2012   PLT 379 11/24/2012      Chemistry      Component Value Date/Time   NA 136 11/24/2012 0847  NA 137 10/22/2012 0448   K 4.2 11/24/2012 0847   K 4.2 10/22/2012 0448   CL 94* 11/01/2012 1105   CL 98 10/22/2012 0448   CO2 30* 11/24/2012 0847   CO2 32 10/22/2012 0448   BUN 25.8 11/24/2012 0847   BUN 26* 10/22/2012 0448   CREATININE 1.1 11/24/2012 0847   CREATININE 0.80 10/22/2012 0448      Component Value Date/Time   CALCIUM 10.1 11/24/2012 0847   CALCIUM 9.3 10/22/2012 0448   ALKPHOS 99 11/24/2012 0847   ALKPHOS 128* 10/18/2012 0500   AST 35* 11/24/2012 0847   AST 40* 10/18/2012 0500   ALT 22 11/24/2012 0847   ALT 34 10/18/2012 0500   BILITOT 0.37 11/24/2012 0847   BILITOT 0.9 10/18/2012 0500       RADIOGRAPHIC STUDIES: No results found.  ASSESSMENT: This is a very pleasant 72 years old white male with metastatic non-small cell lung cancer, adenocarcinoma currently undergoing systemic chemotherapy with carboplatin and Alimta status post 2 cycles. Status post palliative radiotherapy to the left lung mass.  The patient was discussed with him also seen by Dr. Arbutus Ped.  PLAN: He will proceed with his third cycle of systemic chemotherapy with carboplatin for an AUC of 5 and Alimta 500 mg per meter squared given every 3  weeks. He'll continue with weekly labs consisting of a CBC differential and C. met. We will plan to schedule a restaging CT scan of the chest, abdomen and pelvis with contrast to reevaluate his disease prior to his return in 3 weeks also with a repeat CBC differential and C. met prior to cycle #4 of his systemic chemotherapy with carboplatin and Alimta. The patient and his wife are in agreement with plan.  He'll followup with Dr. Arbutus Ped in 3 weeks to discuss the results of the restaging CT scan and to proceed with his next schedule cycle of chemotherapy.  Laural Benes, Belford Pascucci E, PA-C   All questions were answered. The patient knows to call the clinic with any problems, questions or concerns. We can certainly see the patient much sooner if necessary.  ADDENDUM: Hematology/oncology attending: I have the face to face encounter with the patient today. I recommended his care plan. He came to the clinic today after being off treatment for several weeks secondary to legionella pneumonia. He is feeling much better and 78 to resume her systemic chemotherapy. The patient denied having any significant chest pain but continues to have shortness breath with exertion he is currently on home oxygen. We will proceed with cycle #3 of his chemotherapy today as scheduled. The patient would come back for followup visit in 3 weeks with repeat CT scan of the chest, abdomen and pelvis for restaging of his disease.

## 2012-11-26 ENCOUNTER — Other Ambulatory Visit: Payer: Self-pay | Admitting: Physician Assistant

## 2012-11-29 ENCOUNTER — Other Ambulatory Visit (HOSPITAL_BASED_OUTPATIENT_CLINIC_OR_DEPARTMENT_OTHER): Payer: Medicare Other

## 2012-11-29 DIAGNOSIS — C3410 Malignant neoplasm of upper lobe, unspecified bronchus or lung: Secondary | ICD-10-CM

## 2012-11-29 LAB — COMPREHENSIVE METABOLIC PANEL (CC13)
BUN: 32.5 mg/dL — ABNORMAL HIGH (ref 7.0–26.0)
CO2: 30 mEq/L — ABNORMAL HIGH (ref 22–29)
Calcium: 9.7 mg/dL (ref 8.4–10.4)
Chloride: 101 mEq/L (ref 98–109)
Creatinine: 1 mg/dL (ref 0.7–1.3)
Glucose: 125 mg/dl (ref 70–140)

## 2012-11-29 LAB — CBC WITH DIFFERENTIAL/PLATELET
Basophils Absolute: 0 10*3/uL (ref 0.0–0.1)
HCT: 28.8 % — ABNORMAL LOW (ref 38.4–49.9)
HGB: 9.8 g/dL — ABNORMAL LOW (ref 13.0–17.1)
MONO#: 0.1 10*3/uL (ref 0.1–0.9)
NEUT%: 84 % — ABNORMAL HIGH (ref 39.0–75.0)
WBC: 4.8 10*3/uL (ref 4.0–10.3)
lymph#: 0.4 10*3/uL — ABNORMAL LOW (ref 0.9–3.3)

## 2012-11-30 ENCOUNTER — Ambulatory Visit
Admission: RE | Admit: 2012-11-30 | Discharge: 2012-11-30 | Disposition: A | Discharge status: Home / Self Care | Payer: Medicare Other | Source: Ambulatory Visit | Attending: Radiation Oncology | Admitting: Radiation Oncology

## 2012-11-30 DIAGNOSIS — C7949 Secondary malignant neoplasm of other parts of nervous system: Secondary | ICD-10-CM

## 2012-11-30 MED ORDER — GADOBENATE DIMEGLUMINE 529 MG/ML IV SOLN
11.0000 mL | Freq: Once | INTRAVENOUS | Status: AC | PRN
  Administered 2012-11-30: 11 mL via INTRAVENOUS

## 2012-12-01 ENCOUNTER — Inpatient Hospital Stay: Admission: RE | Admit: 2012-12-01 | Payer: Self-pay | Source: Ambulatory Visit | Admitting: Radiation Oncology

## 2012-12-01 NOTE — Progress Notes (Signed)
Quick Note:  Susan, please set-up SRS ______ 

## 2012-12-02 ENCOUNTER — Ambulatory Visit
Admission: RE | Admit: 2012-12-02 | Discharge: 2012-12-02 | Disposition: A | Discharge status: Home / Self Care | Payer: Medicare Other | Source: Ambulatory Visit | Attending: Radiation Oncology | Admitting: Radiation Oncology

## 2012-12-02 DIAGNOSIS — C7931 Secondary malignant neoplasm of brain: Secondary | ICD-10-CM

## 2012-12-02 DIAGNOSIS — C7949 Secondary malignant neoplasm of other parts of nervous system: Secondary | ICD-10-CM

## 2012-12-02 DIAGNOSIS — Z510 Encounter for antineoplastic radiation therapy: Secondary | ICD-10-CM | POA: Insufficient documentation

## 2012-12-02 NOTE — Progress Notes (Signed)
  Radiation Oncology         [phone] ________________________________  Name: Luis Payne MRN: [id number]  Date: 12/02/2012  DOB: [birth year]  SIMULATION AND TREATMENT PLANNING NOTE  DIAGNOSIS:  72 year old gentleman with 2 new isolated subcentimeter brain metastases  NARRATIVE:  The patient was brought to the CT Simulation planning suite.  Identity was confirmed.  All relevant records and images related to the planned course of therapy were reviewed.  The patient freely provided informed written consent to proceed with treatment after reviewing the details related to the planned course of therapy. The consent form was witnessed and verified by the simulation staff. Intravenous access was established for contrast administration. Then, the patient was set-up in a stable reproducible supine position for radiation therapy.  A relocatable thermoplastic stereotactic head frame was fabricated for precise immobilization.  CT images were obtained.  Surface markings were placed.  The CT images were loaded into the planning software and fused with the patient's targeting MRI scan.  Then the target and avoidance structures were contoured.  Treatment planning then occurred.  The radiation prescription was entered and confirmed.  I have requested 3D planning  I have requested a DVH of the following structures: Brain stem, brain, left eye, right I, lenses, optic chiasm, target volumes, uninvolved brain, and normal tissue.    PLAN:  The patient will receive 20 Gy in one fraction.  ________________________________  [name] Kathrynn Running, M.D.

## 2012-12-03 ENCOUNTER — Other Ambulatory Visit: Payer: Self-pay | Admitting: Radiation Oncology

## 2012-12-03 ENCOUNTER — Ambulatory Visit: Admission: RE | Admit: 2012-12-03 | Payer: Medicare Other | Source: Ambulatory Visit | Admitting: Radiation Oncology

## 2012-12-03 ENCOUNTER — Ambulatory Visit: Payer: Medicare Other

## 2012-12-06 ENCOUNTER — Ambulatory Visit: Payer: Medicare Other

## 2012-12-06 ENCOUNTER — Other Ambulatory Visit (HOSPITAL_BASED_OUTPATIENT_CLINIC_OR_DEPARTMENT_OTHER): Payer: Medicare Other | Admitting: Lab

## 2012-12-06 DIAGNOSIS — C3410 Malignant neoplasm of upper lobe, unspecified bronchus or lung: Secondary | ICD-10-CM

## 2012-12-06 LAB — COMPREHENSIVE METABOLIC PANEL (CC13)
AST: 27 U/L (ref 5–34)
Albumin: 2.5 g/dL — ABNORMAL LOW (ref 3.5–5.0)
Alkaline Phosphatase: 116 U/L (ref 40–150)
Potassium: 4.1 mEq/L (ref 3.5–5.1)
Sodium: 138 mEq/L (ref 136–145)
Total Bilirubin: 0.45 mg/dL (ref 0.20–1.20)
Total Protein: 7.5 g/dL (ref 6.4–8.3)

## 2012-12-06 LAB — CBC WITH DIFFERENTIAL/PLATELET
BASO%: 0 % (ref 0.0–2.0)
HCT: 26.7 % — ABNORMAL LOW (ref 38.4–49.9)
HGB: 8.7 g/dL — ABNORMAL LOW (ref 13.0–17.1)
MCHC: 32.6 g/dL (ref 32.0–36.0)
MONO#: 0.3 10*3/uL (ref 0.1–0.9)
NEUT%: 65.1 % (ref 39.0–75.0)
WBC: 2.6 10*3/uL — ABNORMAL LOW (ref 4.0–10.3)
lymph#: 0.6 10*3/uL — ABNORMAL LOW (ref 0.9–3.3)

## 2012-12-07 MED ORDER — SODIUM CHLORIDE 0.9 % IJ SOLN
10.0000 mL | Freq: Once | INTRAMUSCULAR | Status: AC
  Administered 2012-12-07: 10 mL via INTRAVENOUS

## 2012-12-10 ENCOUNTER — Ambulatory Visit (HOSPITAL_COMMUNITY)
Admission: RE | Admit: 2012-12-10 | Discharge: 2012-12-10 | Disposition: A | Discharge status: Home / Self Care | Payer: Medicare Other | Source: Ambulatory Visit | Attending: Physician Assistant | Admitting: Physician Assistant

## 2012-12-10 DIAGNOSIS — Z9221 Personal history of antineoplastic chemotherapy: Secondary | ICD-10-CM | POA: Insufficient documentation

## 2012-12-10 DIAGNOSIS — Z923 Personal history of irradiation: Secondary | ICD-10-CM | POA: Insufficient documentation

## 2012-12-10 DIAGNOSIS — C189 Malignant neoplasm of colon, unspecified: Secondary | ICD-10-CM | POA: Insufficient documentation

## 2012-12-10 DIAGNOSIS — J438 Other emphysema: Secondary | ICD-10-CM | POA: Insufficient documentation

## 2012-12-10 DIAGNOSIS — J8410 Pulmonary fibrosis, unspecified: Secondary | ICD-10-CM | POA: Insufficient documentation

## 2012-12-10 DIAGNOSIS — C801 Malignant (primary) neoplasm, unspecified: Secondary | ICD-10-CM | POA: Insufficient documentation

## 2012-12-10 DIAGNOSIS — N200 Calculus of kidney: Secondary | ICD-10-CM | POA: Insufficient documentation

## 2012-12-10 DIAGNOSIS — R599 Enlarged lymph nodes, unspecified: Secondary | ICD-10-CM | POA: Insufficient documentation

## 2012-12-10 MED ORDER — IOHEXOL 300 MG/ML  SOLN
100.0000 mL | Freq: Once | INTRAMUSCULAR | Status: AC | PRN
  Administered 2012-12-10: 100 mL via INTRAVENOUS

## 2012-12-13 ENCOUNTER — Ambulatory Visit: Payer: Medicare Other | Admitting: Nurse Practitioner

## 2012-12-13 ENCOUNTER — Ambulatory Visit (HOSPITAL_BASED_OUTPATIENT_CLINIC_OR_DEPARTMENT_OTHER): Payer: Medicare Other

## 2012-12-13 ENCOUNTER — Other Ambulatory Visit (HOSPITAL_BASED_OUTPATIENT_CLINIC_OR_DEPARTMENT_OTHER): Payer: Medicare Other

## 2012-12-13 ENCOUNTER — Other Ambulatory Visit: Payer: Medicare Other

## 2012-12-13 ENCOUNTER — Telehealth: Payer: Self-pay | Admitting: Internal Medicine

## 2012-12-13 ENCOUNTER — Ambulatory Visit: Payer: Medicare Other | Admitting: Radiation Oncology

## 2012-12-13 ENCOUNTER — Ambulatory Visit (HOSPITAL_BASED_OUTPATIENT_CLINIC_OR_DEPARTMENT_OTHER): Payer: Medicare Other | Admitting: Physician Assistant

## 2012-12-13 ENCOUNTER — Encounter: Payer: Self-pay | Admitting: Physician Assistant

## 2012-12-13 ENCOUNTER — Encounter: Payer: Medicare Other | Admitting: *Deleted

## 2012-12-13 DIAGNOSIS — Z5111 Encounter for antineoplastic chemotherapy: Secondary | ICD-10-CM

## 2012-12-13 DIAGNOSIS — C7931 Secondary malignant neoplasm of brain: Secondary | ICD-10-CM

## 2012-12-13 DIAGNOSIS — C7949 Secondary malignant neoplasm of other parts of nervous system: Secondary | ICD-10-CM

## 2012-12-13 DIAGNOSIS — C3410 Malignant neoplasm of upper lobe, unspecified bronchus or lung: Secondary | ICD-10-CM

## 2012-12-13 LAB — CBC WITH DIFFERENTIAL/PLATELET
Basophils Absolute: 0 10*3/uL (ref 0.0–0.1)
EOS%: 0 % (ref 0.0–7.0)
HGB: 8.8 g/dL — ABNORMAL LOW (ref 13.0–17.1)
LYMPH%: 12.6 % — ABNORMAL LOW (ref 14.0–49.0)
MCH: 31.1 pg (ref 27.2–33.4)
MCV: 95.8 fL (ref 79.3–98.0)
MONO%: 14.4 % — ABNORMAL HIGH (ref 0.0–14.0)
Platelets: 223 10*3/uL (ref 140–400)
RBC: 2.83 10*6/uL — ABNORMAL LOW (ref 4.20–5.82)
RDW: 16.3 % — ABNORMAL HIGH (ref 11.0–14.6)

## 2012-12-13 LAB — COMPREHENSIVE METABOLIC PANEL (CC13)
Alkaline Phosphatase: 112 U/L (ref 40–150)
BUN: 22.1 mg/dL (ref 7.0–26.0)
Creatinine: 0.9 mg/dL (ref 0.7–1.3)
Glucose: 119 mg/dl (ref 70–140)
Sodium: 139 mEq/L (ref 136–145)
Total Bilirubin: 0.25 mg/dL (ref 0.20–1.20)
Total Protein: 8.4 g/dL — ABNORMAL HIGH (ref 6.4–8.3)

## 2012-12-13 MED ORDER — ONDANSETRON 16 MG/50ML IVPB (CHCC)
16.0000 mg | Freq: Once | INTRAVENOUS | Status: AC
  Administered 2012-12-13: 16 mg via INTRAVENOUS

## 2012-12-13 MED ORDER — DEXAMETHASONE SODIUM PHOSPHATE 20 MG/5ML IJ SOLN
20.0000 mg | Freq: Once | INTRAMUSCULAR | Status: AC
  Administered 2012-12-13: 20 mg via INTRAVENOUS

## 2012-12-13 MED ORDER — CARBOPLATIN CHEMO INJECTION 450 MG/45ML
387.0000 mg | Freq: Once | INTRAVENOUS | Status: AC
  Administered 2012-12-13: 390 mg via INTRAVENOUS
  Filled 2012-12-13: qty 39

## 2012-12-13 MED ORDER — SODIUM CHLORIDE 0.9 % IV SOLN
500.0000 mg/m2 | Freq: Once | INTRAVENOUS | Status: AC
  Administered 2012-12-13: 800 mg via INTRAVENOUS
  Filled 2012-12-13: qty 32

## 2012-12-13 MED ORDER — SODIUM CHLORIDE 0.9 % IV SOLN
Freq: Once | INTRAVENOUS | Status: AC
  Administered 2012-12-13: 13:00:00 via INTRAVENOUS

## 2012-12-13 NOTE — Addendum Note (Signed)
Addended by: Si Gaul on: 12/13/2012 10:42 PM   Modules accepted: Level of Service

## 2012-12-13 NOTE — Progress Notes (Addendum)
[facility] Telephone:([phone]   [phone]  SHARED VISIT PROGRESS NOTE  Luis Payne., MD [address] [place] [address]  DIAGNOSIS: Metastatic non-small cell lung cancer, adenocarcinoma with negative EGFR mutation and negative ALK gene translocation diagnosed in April 2014   PRIOR THERAPY:  1. Status post stereotactic radiotherapy to 2 brain lesions under the care of Dr. Kathrynn Running.  2. Status post palliative radiotherapy to the left lung mass.   CURRENT THERAPY:   Systemic chemotherapy with carboplatin for AUC of 5 and Alimta 500 mg/M2 every 3 weeks, status post 3 cycles. Last given on May, 27, 2014.  DISEASE STAGE: Stage IV  CHEMOTHERAPY INTENT: Palliative  CURRENT # OF CHEMOTHERAPY CYCLES: 3  CURRENT ANTIEMETICS: Zofran , dexamethasone  CURRENT SMOKING STATUS: Former smoker, quit 09/28/2012  ORAL CHEMOTHERAPY AND CONSENT: n/a   CURRENT BISPHOSPHONATES USE: none  PAIN MANAGEMENT: Percocet   NARCOTICS INDUCED CONSTIPATION:  none  LIVING WILL AND CODE STATUS:     INTERVAL HISTORY: Luis Payne 72 y.o. male returns to the clinic today for followup visit accompanied a family member. He is tolerating his chemotherapy with carboplatin and Alimta relatively well. He complains of some fatigue and shortness of breath related to his cardiology medication ( Coreg). He reports he is to see Dr. Kathrynn Running on 12/16/12 for SRS to 2 lesions.The patient denied having any significant nausea or vomiting. He denied having any significant weight loss or night sweats. He has no chest pain, shortness breath,or hemoptysis. He continues to have some fatigue but is gradually getting stronger. He had a restaging CT scan of the chest, abdomen and pelvis and presents to discuss the result.  MEDICAL HISTORY: Past Medical History  Diagnosis Date  . Substance abuse     TOBACCO  . Lung mass   . 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  Brain metastases 08/18/12    mri-  . HOH (hard of hearing)     wears hearing aids  . COPD (chronic obstructive pulmonary disease)     ALLERGIES:  has No Known Allergies.  MEDICATIONS:  Current Outpatient Prescriptions  Medication Sig Dispense Refill  . benzonatate (TESSALON) 200 MG capsule Take 1 capsule (200 mg total) by mouth 3 (three) times daily.  20 capsule  0  . carvedilol (COREG) 3.125 MG tablet Take 1 tablet (3.125 mg total) by mouth 2 (two) times daily.  60 tablet  6  . chlorproMAZINE (THORAZINE) 25 MG tablet Take 25 mg by mouth 3 (three) times daily as needed (for hiccups).      . cholecalciferol (VITAMIN D) 1000 UNITS tablet Take 1,000 Units by mouth every morning.      Marland Kitchen dexamethasone (DECADRON) 4 MG tablet Take 4 mg by mouth 2 (two) times daily with a meal. Day before, day of and day after chemo.      . feeding supplement (ENSURE COMPLETE) LIQD Take 237 mLs by mouth 2 (two) times daily between meals.  30 Bottle  5  . folic acid (FOLVITE) 1 MG tablet Take 1 tablet (1 mg total) by mouth daily.  30 tablet  4  . furosemide (LASIX) 20 MG tablet Take 1 tablet (20 mg total) by mouth daily.  30 tablet  3  . hyaluronate sodium (RADIAPLEXRX) GEL Apply 1 application topically 2 (two) times daily as needed (for radiation burns).       Marland Kitchen ipratropium (ATROVENT) 0.02 % nebulizer solution Take  2.5 mLs (0.5 mg total) by nebulization 4 (four) times daily as needed for wheezing.  75 mL  12  . levalbuterol (XOPENEX) 0.63 MG/3ML nebulizer solution Take 3 mLs (0.63 mg total) by nebulization every 4 (four) hours as needed for wheezing.  3 mL  120  . Multiple Vitamin (MULTIVITAMIN WITH MINERALS) TABS Take 1 tablet by mouth every morning.      Marland Kitchen oxyCODONE-acetaminophen (PERCOCET/ROXICET) 5-325 MG per tablet Take 1 tablet by mouth every 6 (six) hours as needed for pain.  90 tablet  0  . PRESCRIPTION MEDICATION Inject into the vein every 21 ( twenty-one) days. Alimta and Paraplatin infusion q21d.       No  current facility-administered medications for this visit.    SURGICAL HISTORY:  Past Surgical History  Procedure Laterality Date  . Wrist surgery    . Lung biopsy Left 08/17/12    LUL lung mass-adenocarcinoma    REVIEW OF SYSTEMS:  A comprehensive review of systems was negative except for: Constitutional: positive for fatigue Respiratory: positive for cough and dyspnea on exertion   PHYSICAL EXAMINATION: General appearance: alert, cooperative, no distress and Patient is wearing Oxgen via nasal cannula at 2 L/min and is breathing comfortably Head: Normocephalic, without obvious abnormality, atraumatic Neck: no adenopathy Lymph nodes: Cervical, supraclavicular, and axillary nodes normal. Resp: clear to auscultation bilaterally and But generally decreased throughout Cardio: regular rate and rhythm, S1, S2 normal, no murmur, click, rub or gallop GI: soft, non-tender; bowel sounds normal; no masses,  no organomegaly Extremities: extremities normal, atraumatic, no cyanosis or edema Neurologic: Alert and oriented X 3, normal strength and tone. Normal symmetric reflexes. Normal coordination and gait  ECOG PERFORMANCE STATUS: 1 - Symptomatic but completely ambulatory  Blood pressure 105/55, pulse 50, temperature 97 F (36.1 C), temperature source Oral, resp. rate 20, height 5\' 5"  (1.651 m), weight 123 lb (55.792 kg).  LABORATORY DATA: Lab Results  Component Value Date   WBC 4.0 12/13/2012   HGB 8.8* 12/13/2012   HCT 27.1* 12/13/2012   MCV 95.8 12/13/2012   PLT 223 12/13/2012      Chemistry      Component Value Date/Time   NA 139 12/13/2012 1021   NA 137 10/22/2012 0448   K 4.3 12/13/2012 1021   K 4.2 10/22/2012 0448   CL 94* 11/01/2012 1105   CL 98 10/22/2012 0448   CO2 29 12/13/2012 1021   CO2 32 10/22/2012 0448   BUN 22.1 12/13/2012 1021   BUN 26* 10/22/2012 0448   CREATININE 0.9 12/13/2012 1021   CREATININE 0.80 10/22/2012 0448      Component Value Date/Time   CALCIUM 10.0 12/13/2012 1021    CALCIUM 9.3 10/22/2012 0448   ALKPHOS 112 12/13/2012 1021   ALKPHOS 128* 10/18/2012 0500   AST 29 12/13/2012 1021   AST 40* 10/18/2012 0500   ALT 21 12/13/2012 1021   ALT 34 10/18/2012 0500   BILITOT 0.25 12/13/2012 1021   BILITOT 0.9 10/18/2012 0500       RADIOGRAPHIC STUDIES: No results found.  ASSESSMENT: This is a very pleasant 72 years old white male with metastatic non-small cell lung cancer, adenocarcinoma currently undergoing systemic chemotherapy with carboplatin and Alimta status post 3 cycles. Status post palliative radiotherapy to the left lung mass.  The patient was discussed with him also seen by Dr. Arbutus Ped. Dr Arbutus Ped discussed the results of the CT scan that revealed some interval shrinkage of the tumor. There was no evidence for new metastatic  disease.  PLAN: He will proceed with cycle #4 of systemic chemotherapy with carboplatin for an AUC of 5 and Alimta 500 mg per meter squared given every 3 weeks. He'll continue with weekly labs consisting of a CBC differential and C. met. He will return in 3 weeks also with a repeat CBC differential and C. met prior to cycle #5 of his systemic chemotherapy with carboplatin and Alimta. The patient is in agreement with plan.   Luis Payne, Luis Champeau E, PA-C   All questions were answered. The patient knows to call the clinic with any problems, questions or concerns. We can certainly see the patient much sooner if necessary.  ADDENDUM: Hematology/oncology attending: I have the face to face encounter with the patient today. I recommended his care plan. The patient is tolerating his current treatment with carboplatin and Alimta fairly well with no significant adverse effects except for mild fatigue. The recent CT scan of the chest, abdomen and pelvis showed improvement in his disease. I recommended for the patient to proceed with cycle #4 today as scheduled. He would come back for follow up visit in 3 weeks with the next cycle of his chemotherapy. He was  advised to call immediately if he has any concerning symptoms in the interval.  Lajuana Matte., MD 12/13/2012

## 2012-12-13 NOTE — Patient Instructions (Addendum)
Continue weekly labs as scheduled. Follow up in 3 weeks, prior to your next scheduled cycle of chemotherapy 

## 2012-12-13 NOTE — Progress Notes (Signed)
12/13/2012 Patient in to clinic today for routine schedule visit for Cycle 4 treatment. Met with patient upon arrival in the lobby. The patient completed the Patient Reported Outcomes (PROs) independently, including the EQ-5D-3L followed by the Lung Cancer Symptom Scale (LCSS), prior to the lab and MD appointments. Following completion, patient identifiers were added to the EQ-5D-3L questionnaire. The patient is aware that no research blood samples are due to be collected today, but it is anticipated that the next PRO and blood sample collection will occur at the start of cycle 5. The patient is also aware that this schedule of events is dependent on other factors that may result in treatment delays or changes. Thanked patient for his participation in the study. Cindy S. Clelia Croft BSN, RN, CCRP 12/13/2012 11:46 AM

## 2012-12-13 NOTE — Patient Instructions (Addendum)
[facility] Discharge Instructions for Patients Receiving Chemotherapy  Today you received the following chemotherapy agents Alimta/Carboplatin To help prevent nausea and vomiting after your treatment, we encourage you to take your nausea medication as prescribed.  If you develop nausea and vomiting that is not controlled by your nausea medication, call the clinic.   BELOW ARE SYMPTOMS THAT SHOULD BE REPORTED IMMEDIATELY:  *FEVER GREATER THAN 100.5 F  *CHILLS WITH OR WITHOUT FEVER  NAUSEA AND VOMITING THAT IS NOT CONTROLLED WITH YOUR NAUSEA MEDICATION  *UNUSUAL SHORTNESS OF BREATH  *UNUSUAL BRUISING OR BLEEDING  TENDERNESS IN MOUTH AND THROAT WITH OR WITHOUT PRESENCE OF ULCERS  *URINARY PROBLEMS  *BOWEL PROBLEMS  UNUSUAL RASH Items with * indicate a potential emergency and should be followed up as soon as possible.  Feel free to call the clinic you have any questions or concerns. The clinic phone number is [phone].    

## 2012-12-13 NOTE — Telephone Encounter (Signed)
gv pt appt schedule for August and September.  °

## 2012-12-14 ENCOUNTER — Encounter: Payer: Self-pay | Admitting: Nurse Practitioner

## 2012-12-14 ENCOUNTER — Other Ambulatory Visit: Payer: Self-pay | Admitting: Radiation Oncology

## 2012-12-14 ENCOUNTER — Ambulatory Visit: Payer: Medicare Other

## 2012-12-14 ENCOUNTER — Ambulatory Visit (INDEPENDENT_AMBULATORY_CARE_PROVIDER_SITE_OTHER): Payer: Medicare Other | Admitting: Nurse Practitioner

## 2012-12-14 VITALS — BP 110/64 | HR 68 | Ht 66.0 in | Wt 123.1 lb

## 2012-12-14 DIAGNOSIS — I5022 Chronic systolic (congestive) heart failure: Secondary | ICD-10-CM

## 2012-12-14 DIAGNOSIS — R000 Tachycardia, unspecified: Secondary | ICD-10-CM

## 2012-12-14 DIAGNOSIS — I509 Heart failure, unspecified: Secondary | ICD-10-CM

## 2012-12-14 MED ORDER — DIGOXIN 125 MCG PO TABS: 0.1250 mg | ORAL_TABLET | Freq: Every day | ORAL | Status: DC

## 2012-12-14 NOTE — Patient Instructions (Addendum)
We will check an EKG today  Stay on your current medicines  I am adding Digoxin 0.125 mg to take once a day - this can help lower your heart rate without affecting your BP.   We need to check a digoxin level with your next set of labs - can do at the [facility] - do not take your Digoxin on that day. Show the lab tech my prescription.  We will see you back as planned.  Call the [facility] office at [phone] if you have any questions, problems or concerns.

## 2012-12-14 NOTE — Progress Notes (Addendum)
Luis Payne Date of Birth: 1941/04/21 Medical Record #[id number]  History of Present Illness: Luis Payne is seen back today for a 3 week check. Seen for Dr. Elease Hashimoto. Has metastatic lung cancer with brain involvement. Has had radiation and chemo. Had legionella pneumonia back in June. Echo done at that time showed EF down at 30 to 35%.   He was here 3 weeks ago - Dr. Elease Hashimoto started low dose beta blocker. He was tachycardic. He had PND and was sleeping in a recliner.  Comes back today. Here with his wife. He is not happy. Does not wish to see Dr. Melburn Popper back. Said everything was "just negative". He does not understand why we will not be pursuing an aggressive cardiac work up. He is aggravated by his situation. Still short of breath with any exertion. No real chest pain. Feels tired. BP usually low. Not using salt. No swelling. Has gone back to sleeping in the bed and not in the recliner anymore. Still feels his heart beating fast at times. Has labs every Monday - counts slowly improving. Needs to get 2 more rounds of chemo. Going back to radiation oncology due to progression of his tumors in his head.   Current Outpatient Prescriptions  Medication Sig Dispense Refill  . Ascorbic Acid (VITAMIN C) 1000 MG tablet Take 1,000 mg by mouth daily.      . carvedilol (COREG) 3.125 MG tablet Take 1 tablet (3.125 mg total) by mouth 2 (two) times daily.  60 tablet  6  . cholecalciferol (VITAMIN D) 1000 UNITS tablet Take 1,000 Units by mouth every morning.      Marland Kitchen dexamethasone (DECADRON) 4 MG tablet Take 4 mg by mouth 2 (two) times daily with a meal. Day before, day of and day after chemo.      . feeding supplement (ENSURE COMPLETE) LIQD Take 237 mLs by mouth 2 (two) times daily between meals.  30 Bottle  5  . folic acid (FOLVITE) 1 MG tablet Take 1 tablet (1 mg total) by mouth daily.  30 tablet  4  . furosemide (LASIX) 20 MG tablet Take 1 tablet (20 mg total) by mouth daily.  30 tablet  3  . guaiFENesin  (MUCINEX) 600 MG 12 hr tablet Take 600 mg by mouth daily.      Marland Kitchen ipratropium (ATROVENT) 0.02 % nebulizer solution Take 2.5 mLs (0.5 mg total) by nebulization 4 (four) times daily as needed for wheezing.  75 mL  12  . levalbuterol (XOPENEX) 0.63 MG/3ML nebulizer solution Take 3 mLs (0.63 mg total) by nebulization every 4 (four) hours as needed for wheezing.  3 mL  120  . Multiple Vitamin (MULTIVITAMIN WITH MINERALS) TABS Take 1 tablet by mouth every morning.      Marland Kitchen oxyCODONE-acetaminophen (PERCOCET/ROXICET) 5-325 MG per tablet Take 1 tablet by mouth every 6 (six) hours as needed for pain.  90 tablet  0  . OXYGEN-HELIUM IN Inhale 3 L into the lungs daily.      Marland Kitchen PRESCRIPTION MEDICATION Inject into the vein every 21 ( twenty-one) days. Alimta and Paraplatin infusion q21d.       No current facility-administered medications for this visit.    No Known Allergies  Past Medical History  Diagnosis Date  . Substance abuse     TOBACCO  . Lung mass   . 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 Brain metastases 08/18/12    mri-  . HOH (hard of hearing)  wears hearing aids  . COPD (chronic obstructive pulmonary disease)   . Systolic heart failure     EF of 30 to 35% per echo in June 2014    Past Surgical History  Procedure Laterality Date  . Wrist surgery    . Lung biopsy Left 08/17/12    LUL lung mass-adenocarcinoma    History  Smoking status  . Former Smoker -- 1.00 packs/day  . Types: Cigarettes  . Quit date: 09/28/2012  Smokeless tobacco  . Never Used    History  Alcohol Use No    Family History  Problem Relation Age of Onset  . COPD Father   . Cancer Sister   . Diabetes Brother     Review of Systems: The review of systems is per the HPI.  All other systems were reviewed and are negative.  Physical Exam: BP 110/64  Pulse 68  Ht 5\' 6"  (1.676 m)  Wt 123 lb 1.9 oz (55.847 kg)  BMI 19.88 kg/m2 His heart rate is 104 by my exam.  Patient is alert and in no acute  distress. He is frustrated. He has oxygen in place. The wife is in a wheelchair. He is quite thin. Looks chronically ill. Skin weathered. Color is normal.  HEENT is unremarkable. Normocephalic/atraumatic. PERRL. Sclera are nonicteric. Neck is supple. No masses. No JVD. Lungs are coarse. Cardiac exam shows a regular rate and rhythm. Frequent ectopics. Rate is fast by my exam. Abdomen is soft. Extremities are without edema. Gait and ROM are intact. No gross neurologic deficits noted.  LABORATORY DATA:  EKG today shows sinus with PVC's rate of 91 with nonspecific ST changes.   Lab Results  Component Value Date   WBC 4.0 12/13/2012   HGB 8.8* 12/13/2012   HCT 27.1* 12/13/2012   PLT 223 12/13/2012   GLUCOSE 119 12/13/2012   ALT 21 12/13/2012   AST 29 12/13/2012   NA 139 12/13/2012   K 4.3 12/13/2012   CL 94* 11/01/2012   CREATININE 0.9 12/13/2012   BUN 22.1 12/13/2012   CO2 29 12/13/2012   INR 0.96 08/17/2012   Echo Study Conclusions  - Left ventricle: The cavity size was normal. Wall thickness was normal. Systolic function was moderately to severely reduced. The estimated ejection fraction was in the range of 30% to 35%. Diffuse hypokinesis. The study is not technically sufficient to allow evaluation of LV diastolic function. - Aortic valve: Mild to moderate regurgitation directed centrally in the LVOT.  Assessment / Plan:  1. Systolic heart failure - EF of 30 to 35% - I do not think he is going to tolerate further increase in Coreg with his current BP and still needing more chemo. I have added Digoxin 0.125 for his CHF/tachycardia. Check Digoxin level with [facility] labs at the [facility] next Monday - I have had a long discussion with him regarding the diagnosis of HF and why we do not feel an aggressive cardiac work up is indicated at this time. He seems to have a better understanding. Still restrict salt.   2. Metastatic lung cancer - has brain involvement - to see radiation oncology soon for further  disposition. Still needs 2 more rounds of chemo.   I think his overall prognosis is tenuous. He would like to come back and see me in about 3 weeks. Continue to restrict salt.   Patient is agreeable to this plan and will call if any problems develop in the interim.   Rosalio Macadamia,  RN, [facility] [address] [place], [place]  [address]

## 2012-12-15 ENCOUNTER — Telehealth: Payer: Self-pay | Admitting: Nurse Practitioner

## 2012-12-15 ENCOUNTER — Telehealth: Payer: Self-pay | Admitting: *Deleted

## 2012-12-15 ENCOUNTER — Other Ambulatory Visit: Payer: Self-pay | Admitting: *Deleted

## 2012-12-15 ENCOUNTER — Other Ambulatory Visit: Payer: Self-pay

## 2012-12-15 DIAGNOSIS — R918 Other nonspecific abnormal finding of lung field: Secondary | ICD-10-CM

## 2012-12-15 DIAGNOSIS — I5022 Chronic systolic (congestive) heart failure: Secondary | ICD-10-CM

## 2012-12-15 MED ORDER — CARVEDILOL 3.125 MG PO TABS: 3.1250 mg | ORAL_TABLET | Freq: Two times a day (BID) | ORAL | Status: DC

## 2012-12-15 NOTE — Telephone Encounter (Signed)
New Nyra Capes from [facility] needs clarification regarding information they received for this patient. He was seen by Lawson Fiscal on yesterday.

## 2012-12-15 NOTE — Telephone Encounter (Signed)
S/w Consuella Lose from [facility] made her aware that pts pulse needs to be taken apical and pt does not hold dig if hr goes 60 or below.  I also s/w pt's wife needs a refill for a 90 supply on coreg or ins won't pay for it I will [facility] it [facility] [place]

## 2012-12-15 NOTE — Telephone Encounter (Signed)
S/w Consuella Lose from [facility] to clarify pts digoxin if HR is less than 60 can they hold and if it was ok to take coreg and dig together. Will discuss with Lawson Fiscal

## 2012-12-16 ENCOUNTER — Encounter: Payer: Self-pay | Admitting: Radiation Oncology

## 2012-12-16 ENCOUNTER — Ambulatory Visit
Admission: RE | Admit: 2012-12-16 | Discharge: 2012-12-16 | Disposition: A | Discharge status: Home / Self Care | Payer: Medicare Other | Source: Ambulatory Visit | Attending: Radiation Oncology | Admitting: Radiation Oncology

## 2012-12-16 VITALS — BP 116/49 | HR 48 | Temp 97.8°F | Resp 18

## 2012-12-16 DIAGNOSIS — C7949 Secondary malignant neoplasm of other parts of nervous system: Secondary | ICD-10-CM

## 2012-12-16 NOTE — Progress Notes (Signed)
  Radiation Oncology         [phone] ________________________________  Stereotactic Treatment Procedure Note  Name: Luis Payne  MRN: [id number]  Date: 12/16/2012  DOB: 29-Mar-1941  SPECIAL TREATMENT PROCEDURE  3D TREATMENT PLANNING AND DOSIMETRY:  The patient's radiation plan was reviewed and approved by neurosurgery and radiation oncology prior to treatment.  It showed 3-dimensional radiation distributions overlaid onto the planning CT/MRI image set.  The [facility] for the target structures as well as the organs at risk were reviewed. The documentation of the 3D plan and dosimetry are filed in the radiation oncology EMR.  NARRATIVE:  [name] was brought to the TrueBeam stereotactic radiation treatment machine and placed supine on the CT couch. The head frame was applied, and the patient was set up for stereotactic radiosurgery.  Dr. Newell Coral from neurosurgery was present for the set-up and delivery  SIMULATION VERIFICATION:  In the couch zero-angle position, the patient underwent Exactrac imaging using the Brainlab system with orthogonal KV images.  These were carefully aligned and repeated to confirm treatment position for each of the isocenters.  The Exactrac snap film verification was repeated at each couch angle.  SPECIAL TREATMENT PROCEDURE: [name] received stereotactic radiosurgery to the following targets:  Left cerebellar 6 mm target was treated using 3 Dynamic Conformal Arcs to a prescription dose of 20 Gy.  ExacTrac registration was performed for each couch angle.  The 83.3% isodose line was prescribed.  Right frontal 2 mm target was treated using 2 Circular Arcs to a prescription dose of 20 Gy.  ExacTrac registration was performed for each couch angle.  The 6 mm collimator was used.  The 71.4% isodose line was prescribed.  STEREOTACTIC TREATMENT MANAGEMENT:  Following delivery, the patient was transported to nursing in stable condition and monitored for possible acute  effects.  Vital signs were recorded BP 116/49  Pulse 48  Temp(Src) 97.8 F (36.6 C) (Oral)  Resp 18  SpO2 93%. The patient tolerated treatment without significant acute effects, and was discharged to home in stable condition.    PLAN: Follow-up in one month.  ________________________________  [name]. Kathrynn Running, M.D.

## 2012-12-16 NOTE — Op Note (Signed)
Stereotactic Radiosurgery Operative Note  Name: Luis Payne MRN: [id number]  Date: 12/16/2012  DOB: 03-06-1941  Op Note  Pre Operative Diagnosis: multiple brain metastases  Post Operative Diagnois:  multiple brain metastases  3D TREATMENT PLANNING AND DOSIMETRY:  The patient's radiation plan was reviewed and approved by myself (neurosurgery) and Dr. Oneita Hurt, MD (radiation oncology) prior to treatment.  It showed 3-dimensional radiation distributions overlaid onto the planning CT/MRI image set.  The [facility] for the target structures as well as the organs at risk were reviewed. The documentation of the 3D plan and dosimetry are filed in the radiation oncology EMR.  NARRATIVE:  Luis Payne was brought to the TrueBeam stereotactic radiation treatment machine and placed supine on the CT couch. The head frame was applied, and the patient was set up for stereotactic radiosurgery.  I was present for the set-up and delivery.  SIMULATION VERIFICATION:  In the couch zero-angle position, the patient underwent Exactrac imaging using the Brainlab system with orthogonal KV images.  These were carefully aligned and repeated to confirm treatment position for each of the isocenters.  The Exactrac snap film verification was repeated at each couch angle.  SPECIAL TREATMENT PROCEDURE: Luis Payne received stereotactic radiosurgery to the following targets:  Left cerebellar 6 mm target was treated using 3 Dynamic Conformal Arcs to a prescription dose of 20 Gy. ExacTrac registration was performed for each couch angle. The 83.3% isodose line was prescribed.  Right frontal 2 mm target was treated using 2 Circular Arcs to a prescription dose of 20 Gy. ExacTrac registration was performed for each couch angle. The 6 mm collimator was used. The 71.4% isodose line was prescribed.   STEREOTACTIC TREATMENT MANAGEMENT:  Following delivery, the patient was transported to nursing in stable condition and monitored for  possible acute effects.  Vital signs were recorded There were no vitals taken for this visit.. The patient tolerated treatment without significant acute effects, and was discharged to home in stable condition.    PLAN: Follow-up in one month.

## 2012-12-16 NOTE — Progress Notes (Signed)
Received patient and his brother in the clinic following SRS treatment. Patient alert and oriented to person, place, and time. No distress noted. Steady gait noted. Oxygen therapy two liter via nasal cannula noted. Denies nausea, vomiting, headache or dizziness. Cheerful affect noted. Reports mild fatigue. Offered drink and snack but, patient declined. Will continue to monitor patient closely. Instructed patient to call with needs and he verbalized understanding.

## 2012-12-16 NOTE — Progress Notes (Signed)
Patient resting in geri chair. No distress noted. Vital signs stable. Denies complaints. Denies nausea, headache or dizziness. Denies taking decadron. Encouraged patient to go home and rest for the remainder of the day then, tomorrow he can resume normal activities. Encouraged to call with future needs. Provided patient with one month follow up appointment. Patient verbalized understanding of all reviewed. Patient discharged home with his brother.

## 2012-12-17 ENCOUNTER — Ambulatory Visit: Payer: Medicare Other | Admitting: Radiation Oncology

## 2012-12-19 NOTE — Progress Notes (Signed)
°  Radiation Oncology         [phone]) [phone] ________________________________  Name: Luis Payne MRN: [id number]  Date: 12/16/2012  DOB: [birth year]  End of Treatment Note  Diagnosis:   72 year old gentleman with 2 new isolated subcentimeter brain metastases  Indication for treatment:  palliation       Radiation treatment dates:  12/16/2012  Site/dose/beams/energy:      Left cerebellar 6 mm target was treated using 3 Dynamic Conformal Arcs to a prescription dose of 20 Gy. ExacTrac registration was performed for each couch angle. The 83.3% isodose line was prescribed.    Right frontal 2 mm target was treated using 2 Circular Arcs to a prescription dose of 20 Gy. ExacTrac registration was performed for each couch angle. The 6 mm collimator was used. The 71.4% isodose line was prescribed. 6 megavolt photons in the flattening filter free beam mode were delivered for all fields.  Narrative: The patient tolerated radiation treatment relatively well.   No acute complications occurred.  Plan: The patient has completed radiation treatment. The patient will return to radiation oncology clinic for routine followup in one month. I advised them to call or return sooner if they have any questions or concerns related to their recovery or treatment. ________________________________  [name]. Kathrynn Running, M.D.

## 2012-12-20 ENCOUNTER — Other Ambulatory Visit: Payer: Self-pay | Admitting: Nurse Practitioner

## 2012-12-20 ENCOUNTER — Other Ambulatory Visit (HOSPITAL_BASED_OUTPATIENT_CLINIC_OR_DEPARTMENT_OTHER): Payer: Medicare Other

## 2012-12-20 DIAGNOSIS — C3410 Malignant neoplasm of upper lobe, unspecified bronchus or lung: Secondary | ICD-10-CM

## 2012-12-20 LAB — COMPREHENSIVE METABOLIC PANEL (CC13)
Alkaline Phosphatase: 105 U/L (ref 40–150)
BUN: 24.7 mg/dL (ref 7.0–26.0)
Glucose: 98 mg/dl (ref 70–140)
Sodium: 138 mEq/L (ref 136–145)
Total Bilirubin: 0.53 mg/dL (ref 0.20–1.20)
Total Protein: 7.8 g/dL (ref 6.4–8.3)

## 2012-12-20 LAB — CBC WITH DIFFERENTIAL/PLATELET
Basophils Absolute: 0 10*3/uL (ref 0.0–0.1)
EOS%: 0 % (ref 0.0–7.0)
HGB: 8.7 g/dL — ABNORMAL LOW (ref 13.0–17.1)
MCH: 31.1 pg (ref 27.2–33.4)
MCHC: 32.6 g/dL (ref 32.0–36.0)
MCV: 95.4 fL (ref 79.3–98.0)
MONO%: 5.6 % (ref 0.0–14.0)
RDW: 16.8 % — ABNORMAL HIGH (ref 11.0–14.6)

## 2012-12-20 LAB — DIGOXIN LEVEL: Digoxin Level: 0.3 ng/mL — ABNORMAL LOW (ref 0.8–2.0)

## 2012-12-27 ENCOUNTER — Other Ambulatory Visit (HOSPITAL_BASED_OUTPATIENT_CLINIC_OR_DEPARTMENT_OTHER): Payer: Medicare Other | Admitting: Lab

## 2012-12-27 ENCOUNTER — Ambulatory Visit: Payer: Medicare Other

## 2012-12-27 DIAGNOSIS — C3410 Malignant neoplasm of upper lobe, unspecified bronchus or lung: Secondary | ICD-10-CM

## 2012-12-27 LAB — CBC WITH DIFFERENTIAL/PLATELET
Basophils Absolute: 0 10*3/uL (ref 0.0–0.1)
Eosinophils Absolute: 0 10*3/uL (ref 0.0–0.5)
HCT: 23.7 % — ABNORMAL LOW (ref 38.4–49.9)
LYMPH%: 26.2 % (ref 14.0–49.0)
MONO#: 0.2 10*3/uL (ref 0.1–0.9)
NEUT#: 0.6 10*3/uL — ABNORMAL LOW (ref 1.5–6.5)
NEUT%: 53 % (ref 39.0–75.0)
Platelets: 23 10*3/uL — ABNORMAL LOW (ref 140–400)
WBC: 1.2 10*3/uL — ABNORMAL LOW (ref 4.0–10.3)

## 2012-12-27 LAB — COMPREHENSIVE METABOLIC PANEL (CC13)
CO2: 27 mEq/L (ref 22–29)
Creatinine: 0.9 mg/dL (ref 0.7–1.3)
Glucose: 102 mg/dl (ref 70–140)
Total Bilirubin: 0.37 mg/dL (ref 0.20–1.20)

## 2012-12-29 ENCOUNTER — Ambulatory Visit (INDEPENDENT_AMBULATORY_CARE_PROVIDER_SITE_OTHER): Payer: Medicare Other | Admitting: Nurse Practitioner

## 2012-12-29 ENCOUNTER — Encounter: Payer: Self-pay | Admitting: Nurse Practitioner

## 2012-12-29 VITALS — BP 120/64 | HR 94 | Ht 65.0 in | Wt 123.1 lb

## 2012-12-29 DIAGNOSIS — I5022 Chronic systolic (congestive) heart failure: Secondary | ICD-10-CM

## 2012-12-29 DIAGNOSIS — R918 Other nonspecific abnormal finding of lung field: Secondary | ICD-10-CM

## 2012-12-29 DIAGNOSIS — C3410 Malignant neoplasm of upper lobe, unspecified bronchus or lung: Secondary | ICD-10-CM

## 2012-12-29 DIAGNOSIS — I509 Heart failure, unspecified: Secondary | ICD-10-CM

## 2012-12-29 DIAGNOSIS — R000 Tachycardia, unspecified: Secondary | ICD-10-CM

## 2012-12-29 DIAGNOSIS — R222 Localized swelling, mass and lump, trunk: Secondary | ICD-10-CM

## 2012-12-29 MED ORDER — DIGOXIN 125 MCG PO TABS: 0.1250 mg | ORAL_TABLET | Freq: Every day | ORAL | Status: DC

## 2012-12-29 MED ORDER — LISINOPRIL 2.5 MG PO TABS: 2.5000 mg | ORAL_TABLET | Freq: Every day | ORAL | Status: DC

## 2012-12-29 MED ORDER — CARVEDILOL 3.125 MG PO TABS: 3.1250 mg | ORAL_TABLET | Freq: Two times a day (BID) | ORAL | Status: DC

## 2012-12-29 NOTE — Patient Instructions (Addendum)
Stay on your current medicines but I am adding Lisinopril 2. 5 mg daily  I will see you in a month  Call the [facility] office at [phone] if you have any questions, problems or concerns.

## 2012-12-29 NOTE — Progress Notes (Addendum)
Luis Payne Date of Birth: 09/06/1940 Medical Record #[id number]  History of Present Illness: Luis Payne is seen back today for a 2 week check. Seen for Dr. Elease Hashimoto. He has metastatic lung cancer with brain involvement. Has had radiation and chemo. Had legionella pneumonia back in June. Echo done at that time showed EF down at 30 to 35%.   In July Dr. Elease Hashimoto started low dose beta blocker. He was tachycardic. He had PND and was sleeping in a recliner.   Seen back 2 weeks ago - was not happy with his care and did not really understand this diagnosis. Was still short of breath with any exertion. Needing to get 2 more rounds of chemo to see radiation oncology due to progression of his tumors in his head - has since received stereotactic radiosurgery. We added low dose Digoxin for rate control. BP was soft and I did not feel he would tolerate further titration.   Comes back today. Here with his wife. Doing ok. Has had his radiation completed. Still needs 2 rounds of chemo. Counts are low. He remains short of breath. No more palpitations and does not feel his heart beating fast anymore. BP 110 to 120 at home. Not dizzy. No swelling. Still frustrated that he can't do anything. He did have a spell where he went out and did not wear his oxygen while trying to pick up a part - says he passed out - his brother put his oxygen back on him and he was ok. Refused evaluation. I think he now understands that he has to just wear it constantly.   Current Outpatient Prescriptions  Medication Sig Dispense Refill  . Ascorbic Acid (VITAMIN C) 1000 MG tablet Take 1,000 mg by mouth daily.      . carvedilol (COREG) 3.125 MG tablet Take 1 tablet (3.125 mg total) by mouth 2 (two) times daily.  180 tablet  2  . cholecalciferol (VITAMIN D) 1000 UNITS tablet Take 1,000 Units by mouth every morning.      Marland Kitchen dexamethasone (DECADRON) 4 MG tablet Take 4 mg by mouth 2 (two) times daily with a meal. Day before, day of and day after  chemo.      . digoxin (LANOXIN) 0.125 MG tablet Take 1 tablet (0.125 mg total) by mouth daily.  30 tablet  3  . feeding supplement (ENSURE COMPLETE) LIQD Take 237 mLs by mouth 2 (two) times daily between meals.  30 Bottle  5  . folic acid (FOLVITE) 1 MG tablet Take 1 tablet (1 mg total) by mouth daily.  30 tablet  4  . furosemide (LASIX) 20 MG tablet Take 1 tablet (20 mg total) by mouth daily.  30 tablet  3  . guaiFENesin (MUCINEX) 600 MG 12 hr tablet Take 600 mg by mouth daily.      Marland Kitchen levalbuterol (XOPENEX) 0.63 MG/3ML nebulizer solution Take 3 mLs (0.63 mg total) by nebulization every 4 (four) hours as needed for wheezing.  3 mL  120  . Multiple Vitamin (MULTIVITAMIN WITH MINERALS) TABS Take 1 tablet by mouth every morning.      Marland Kitchen oxyCODONE-acetaminophen (PERCOCET/ROXICET) 5-325 MG per tablet Take 1 tablet by mouth every 6 (six) hours as needed for pain.  90 tablet  0  . OXYGEN-HELIUM IN Inhale 3 L into the lungs daily.      Marland Kitchen PRESCRIPTION MEDICATION Inject into the vein every 21 ( twenty-one) days. Alimta and Paraplatin infusion q21d.       No  current facility-administered medications for this visit.    No Known Allergies  Past Medical History  Diagnosis Date  . Substance abuse     TOBACCO  . Lung mass   . 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 Brain metastases 08/18/12    mri-  . HOH (hard of hearing)     wears hearing aids  . COPD (chronic obstructive pulmonary disease)   . Systolic heart failure     EF of 30 to 35% per echo in June 2014    Past Surgical History  Procedure Laterality Date  . Wrist surgery    . Lung biopsy Left 08/17/12    LUL lung mass-adenocarcinoma    History  Smoking status  . Former Smoker -- 1.00 packs/day  . Types: Cigarettes  . Quit date: 09/28/2012  Smokeless tobacco  . Never Used    History  Alcohol Use No    Family History  Problem Relation Age of Onset  . COPD Father   . Cancer Sister   . Diabetes Brother     Review of  Systems: The review of systems is per the HPI.  All other systems were reviewed and are negative.  Physical Exam: BP 120/64  Pulse 94  Ht 5\' 5"  (1.651 m)  Wt 123 lb 1.9 oz (55.847 kg)  BMI 20.49 kg/m2 Patient is very pleasant and in no acute distress. Weight is unchanged. Skin is warm and dry. Color is normal.  HEENT is unremarkable. Normocephalic/atraumatic. PERRL. Sclera are nonicteric. Neck is supple. No masses. No JVD. Lungs are clear. Cardiac exam shows a regular rate and rhythm. Not as fast. Abdomen is soft. Extremities are without edema. Gait and ROM are intact. No gross neurologic deficits noted.  LABORATORY DATA: EKG today shows sinus with his rate down to 94 today  Lab Results  Component Value Date   WBC 1.2* 12/27/2012   HGB 8.2* 12/27/2012   HCT 23.7* 12/27/2012   PLT 23* 12/27/2012   GLUCOSE 102 12/27/2012   ALT 20 12/27/2012   AST 24 12/27/2012   NA 138 12/27/2012   K 4.3 12/27/2012   CL 94* 11/01/2012   CREATININE 0.9 12/27/2012   BUN 18.5 12/27/2012   CO2 27 12/27/2012   INR 0.96 08/17/2012   Digoxin level 0.3  Assessment / Plan:  1. Systolic heart failure - with EF 30 to 35% - on low dose Coreg and digoxin - will try to add Lisinopril 2. 5 mg daily. See him back in a month. Overall course is tenuous.   2. Tachycardia - now on low dose digoxin - rate is better.   3. Metastatic lung cancer/brain mets - has had his course of radiation.   4. Dyspnea - unchanged - oxygen in place - had significant emphysema on last chest CT/radiation/CHF.   Patient is agreeable to this plan and will call if any problems develop in the interim.   Rosalio Macadamia, RN, ANP-C [facility] [address] [place], [place]  [address]

## 2013-01-03 ENCOUNTER — Encounter: Payer: Self-pay | Admitting: *Deleted

## 2013-01-03 ENCOUNTER — Other Ambulatory Visit: Payer: Medicare Other | Admitting: Lab

## 2013-01-03 ENCOUNTER — Ambulatory Visit (HOSPITAL_BASED_OUTPATIENT_CLINIC_OR_DEPARTMENT_OTHER): Payer: Medicare Other | Admitting: Internal Medicine

## 2013-01-03 ENCOUNTER — Encounter: Payer: Self-pay | Admitting: Internal Medicine

## 2013-01-03 ENCOUNTER — Telehealth: Payer: Self-pay | Admitting: Internal Medicine

## 2013-01-03 ENCOUNTER — Ambulatory Visit (HOSPITAL_BASED_OUTPATIENT_CLINIC_OR_DEPARTMENT_OTHER): Payer: Medicare Other

## 2013-01-03 ENCOUNTER — Other Ambulatory Visit (HOSPITAL_BASED_OUTPATIENT_CLINIC_OR_DEPARTMENT_OTHER): Payer: Medicare Other | Admitting: Lab

## 2013-01-03 DIAGNOSIS — C3410 Malignant neoplasm of upper lobe, unspecified bronchus or lung: Secondary | ICD-10-CM

## 2013-01-03 DIAGNOSIS — Z5111 Encounter for antineoplastic chemotherapy: Secondary | ICD-10-CM

## 2013-01-03 DIAGNOSIS — C7931 Secondary malignant neoplasm of brain: Secondary | ICD-10-CM

## 2013-01-03 DIAGNOSIS — D6481 Anemia due to antineoplastic chemotherapy: Secondary | ICD-10-CM

## 2013-01-03 LAB — CBC WITH DIFFERENTIAL/PLATELET
Basophils Absolute: 0 10*3/uL (ref 0.0–0.1)
EOS%: 0 % (ref 0.0–7.0)
HGB: 8.4 g/dL — ABNORMAL LOW (ref 13.0–17.1)
MCH: 31.3 pg (ref 27.2–33.4)
MCHC: 34.1 g/dL (ref 32.0–36.0)
MCV: 91.8 fL (ref 79.3–98.0)
MONO%: 16.4 % — ABNORMAL HIGH (ref 0.0–14.0)
NEUT%: 74.4 % (ref 39.0–75.0)
RDW: 18 % — ABNORMAL HIGH (ref 11.0–14.6)

## 2013-01-03 LAB — COMPREHENSIVE METABOLIC PANEL (CC13)
CO2: 28 mEq/L (ref 22–29)
Creatinine: 0.9 mg/dL (ref 0.7–1.3)
Glucose: 142 mg/dl — ABNORMAL HIGH (ref 70–140)
Total Bilirubin: 0.27 mg/dL (ref 0.20–1.20)

## 2013-01-03 LAB — RESEARCH LABS

## 2013-01-03 MED ORDER — SODIUM CHLORIDE 0.9 % IV SOLN
500.0000 mg/m2 | Freq: Once | INTRAVENOUS | Status: AC
  Administered 2013-01-03: 800 mg via INTRAVENOUS
  Filled 2013-01-03: qty 32

## 2013-01-03 MED ORDER — SODIUM CHLORIDE 0.9 % IV SOLN
387.0000 mg | Freq: Once | INTRAVENOUS | Status: AC
  Administered 2013-01-03: 390 mg via INTRAVENOUS
  Filled 2013-01-03: qty 39

## 2013-01-03 MED ORDER — SODIUM CHLORIDE 0.9 % IV SOLN
Freq: Once | INTRAVENOUS | Status: AC
  Administered 2013-01-03: 11:00:00 via INTRAVENOUS

## 2013-01-03 MED ORDER — ONDANSETRON 16 MG/50ML IVPB (CHCC)
16.0000 mg | Freq: Once | INTRAVENOUS | Status: AC
  Administered 2013-01-03: 16 mg via INTRAVENOUS

## 2013-01-03 MED ORDER — DEXAMETHASONE SODIUM PHOSPHATE 20 MG/5ML IJ SOLN
20.0000 mg | Freq: Once | INTRAMUSCULAR | Status: AC
  Administered 2013-01-03: 20 mg via INTRAVENOUS

## 2013-01-03 NOTE — Progress Notes (Signed)
01/03/2013 Patient in to clinic today for planned start of treatment cycle 5. Met with patient in lobby upon arrival. The patient completed the Patient Reported Outcomes (PROs) independently, including the EQ-5D-3L followed by the Lung Cancer Symptom Scale (LCSS), prior to the lab and MD appointments. Following completion, patient identifiers were added to the EQ-5D-3L questionnaire. Patient is aware that research blood samples will be collected at today's visit. Thanked patient for his participation. Cindy S. Clelia Croft BSN, RN, CCRP 01/03/2013 11:24 AM

## 2013-01-03 NOTE — Progress Notes (Signed)
[facility] Telephone:([phone]) [phone]   Fax:([phone]) [phone]  OFFICE PROGRESS NOTE  Cassell Smiles., MD [address] [place] [address]  DIAGNOSIS: Metastatic non-small cell lung cancer, adenocarcinoma with negative EGFR mutation and negative ALK gene translocation diagnosed in April 2014   PRIOR THERAPY:  1. Status post stereotactic radiotherapy to 2 brain lesions under the care of Dr. Kathrynn Running.  2. Status post palliative radiotherapy to the left lung mass.   CURRENT THERAPY:  Systemic chemotherapy with carboplatin for AUC of 5 and Alimta 500 mg/M2 every 3 weeks, status post 4 cycles. Last given on May, 27, 2014.   CHEMOTHERAPY INTENT: Palliative  CURRENT # OF CHEMOTHERAPY CYCLES: 4CURRENT ANTIEMETICS: Zofran , dexamethasone  CURRENT SMOKING STATUS: Former smoker, quit 09/28/2012  ORAL CHEMOTHERAPY AND CONSENT: n/a  CURRENT BISPHOSPHONATES USE: none  PAIN MANAGEMENT: Percocet  NARCOTICS INDUCED CONSTIPATION: none  LIVING WILL AND CODE STATUS: No code Blue.   INTERVAL HISTORY: Luis Payne 72 y.o. male returns to the clinic today for followup visit accompanied his son. The patient is tolerating his treatment fairly well with no significant adverse effects except for fatigue. He denied having any significant chest pain, shortness of breath, cough or hemoptysis. He has no fever or chills. He has no weight loss or night sweats. She is here today to start cycle #5 of his chemotherapy.  MEDICAL HISTORY: Past Medical History  Diagnosis Date  . Substance abuse     TOBACCO  . Lung mass   . 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 Brain metastases 08/18/12    mri-  . HOH (hard of hearing)     wears hearing aids  . COPD (chronic obstructive pulmonary disease)   . Systolic heart failure     EF of 30 to 35% per echo in June 2014    ALLERGIES:  has No Known Allergies.  MEDICATIONS:  Current Outpatient Prescriptions  Medication Sig  Dispense Refill  . Ascorbic Acid (VITAMIN C) 1000 MG tablet Take 1,000 mg by mouth daily.      . carvedilol (COREG) 3.125 MG tablet Take 1 tablet (3.125 mg total) by mouth 2 (two) times daily.  180 tablet  3  . cholecalciferol (VITAMIN D) 1000 UNITS tablet Take 1,000 Units by mouth every morning.      Marland Kitchen dexamethasone (DECADRON) 4 MG tablet Take 4 mg by mouth 2 (two) times daily with a meal. Day before, day of and day after chemo.      . digoxin (LANOXIN) 0.125 MG tablet Take 1 tablet (0.125 mg total) by mouth daily.  90 tablet  3  . feeding supplement (ENSURE COMPLETE) LIQD Take 237 mLs by mouth 2 (two) times daily between meals.  30 Bottle  5  . folic acid (FOLVITE) 1 MG tablet Take 1 tablet (1 mg total) by mouth daily.  30 tablet  4  . furosemide (LASIX) 20 MG tablet Take 1 tablet (20 mg total) by mouth daily.  30 tablet  3  . guaiFENesin (MUCINEX) 600 MG 12 hr tablet Take 600 mg by mouth daily.      Marland Kitchen levalbuterol (XOPENEX) 0.63 MG/3ML nebulizer solution Take 3 mLs (0.63 mg total) by nebulization every 4 (four) hours as needed for wheezing.  3 mL  120  . lisinopril (PRINIVIL,ZESTRIL) 2.5 MG tablet Take 1 tablet (2.5 mg total) by mouth daily.  90 tablet  3  . Multiple Vitamin (MULTIVITAMIN WITH MINERALS)  TABS Take 1 tablet by mouth every morning.      Marland Kitchen oxyCODONE-acetaminophen (PERCOCET/ROXICET) 5-325 MG per tablet Take 1 tablet by mouth every 6 (six) hours as needed for pain.  90 tablet  0  . OXYGEN-HELIUM IN Inhale 3 L into the lungs daily.      Marland Kitchen PRESCRIPTION MEDICATION Inject into the vein every 21 ( twenty-one) days. Alimta and Paraplatin infusion q21d.       No current facility-administered medications for this visit.    SURGICAL HISTORY:  Past Surgical History  Procedure Laterality Date  . Wrist surgery    . Lung biopsy Left 08/17/12    LUL lung mass-adenocarcinoma    REVIEW OF SYSTEMS:  A comprehensive review of systems was negative except for: Constitutional: positive for fatigue    PHYSICAL EXAMINATION: General appearance: alert, cooperative, fatigued and no distress Head: Normocephalic, without obvious abnormality, atraumatic Neck: no adenopathy Lymph nodes: Cervical, supraclavicular, and axillary nodes normal. Resp: clear to auscultation bilaterally Cardio: regular rate and rhythm, S1, S2 normal, no murmur, click, rub or gallop GI: soft, non-tender; bowel sounds normal; no masses,  no organomegaly Extremities: extremities normal, atraumatic, no cyanosis or edema  ECOG PERFORMANCE STATUS: 1 - Symptomatic but completely ambulatory  Blood pressure 122/53, pulse 47, temperature 96.7 F (35.9 C), temperature source Oral, resp. rate 18, height 5\' 5"  (1.651 m), weight 122 lb 14.4 oz (55.747 kg).  LABORATORY DATA: Lab Results  Component Value Date   WBC 7.0 01/03/2013   HGB 8.4* 01/03/2013   HCT 24.6* 01/03/2013   MCV 91.8 01/03/2013   PLT 299 01/03/2013      Chemistry      Component Value Date/Time   NA 138 12/27/2012 0853   NA 137 10/22/2012 0448   K 4.3 12/27/2012 0853   K 4.2 10/22/2012 0448   CL 94* 11/01/2012 1105   CL 98 10/22/2012 0448   CO2 27 12/27/2012 0853   CO2 32 10/22/2012 0448   BUN 18.5 12/27/2012 0853   BUN 26* 10/22/2012 0448   CREATININE 0.9 12/27/2012 0853   CREATININE 0.80 10/22/2012 0448      Component Value Date/Time   CALCIUM 9.6 12/27/2012 0853   CALCIUM 9.3 10/22/2012 0448   ALKPHOS 111 12/27/2012 0853   ALKPHOS 128* 10/18/2012 0500   AST 24 12/27/2012 0853   AST 40* 10/18/2012 0500   ALT 20 12/27/2012 0853   ALT 34 10/18/2012 0500   BILITOT 0.37 12/27/2012 0853   BILITOT 0.9 10/18/2012 0500       RADIOGRAPHIC STUDIES: Ct Chest W Contrast  12/10/2012   *RADIOLOGY REPORT*  Clinical Data:  Restaging metastatic colon cancer  CT CHEST, ABDOMEN AND PELVIS WITH CONTRAST  Technique:  Multidetector CT imaging of the chest, abdomen and pelvis was performed following the standard protocol during bolus administration of intravenous contrast.  Contrast:  OMNIPAQUE IOHEXOL 300 MG/ML  SOLN  Comparison:  CT 07/23/2012, PET CT 08/02/2012    CT CHEST  Findings:  The left upper lobe mass is decreased in volume measuring 4.3 x 3.9 cm in axial dimension (image 11) compared to 5.6 x 4.8 cm on prior.  More inferiorly in the left upper lobe there is new air space disease superimposed on chronic interstitial lung disease.  There is new consolidation in the superior segment of the left lower lobe.  These findings are likely relate to radiation therapy.  A small focus of consolidation / atelectasis at the left lung base measures 2.5 x  2.2 cm (image 34).  In the right lower lobe there is increased consolidation medially with air bronchograms as well as in the right upper lobe along the oblique fissure.  No axillary supraclavicular lymphadenopathy.  No mediastinal lymphadenopathy.  8 mm right lower paratracheal lymph node is slightly increased from 6 mm on prior.  IMPRESSION:  1.  Interval contraction of the left upper lobe mass suggests positive chemo radiation therapy effect. 2.  New consolidation within the lingula, left lower lobe, and right lower lobe likely represents postradiation change.  Cannot exclude superimposed infection. 3.  Severe interstitial lung disease and emphysema. 4.  Slight enlargement of a small paratracheal lymph node is likely reactive.    CT ABDOMEN AND PELVIS  Findings:  No focal hepatic lesion in the.  The gallbladder, pancreas, spleen, adrenal glands, and kidneys are normal.  There are multiple calculi within the left and right kidney which are nonobstructive.  The stomach, small bowel, appendix, and cecum are normal.  The colon and rectosigmoid colon are normal.  Abdominal aorta is normal caliber.  No retroperitoneal periportal lymphadenopathy.  IMPRESSION:  1.  No evidence of metastasis in the abdomen or pelvis. 2.  Bilateral nephrolithiasis.   Original Report Authenticated By: Genevive Bi, M.D.   ASSESSMENT AND PLAN: This is a very  pleasant 72 years old white male with history of metastatic non-small cell lung cancer currently on systemic chemotherapy with carboplatin and Alimta status post 4 cycles and tolerating his treatment fairly well except for fatigue from chemotherapy-induced anemia. I recommended for the patient to proceed with cycle #5 today as scheduled. The patient would come back for followup visit in 3 weeks with the next cycle of his chemotherapy. He was advised to call immediately if he has any concerning symptoms and interval. The patient voices understanding of current disease status and treatment options and is in agreement with the current care plan.  All questions were answered. The patient knows to call the clinic with any problems, questions or concerns. We can certainly see the patient much sooner if necessary.

## 2013-01-03 NOTE — Patient Instructions (Addendum)
[facility] Discharge Instructions for Patients Receiving Chemotherapy  Today you received the following chemotherapy agents Alimta and Carboplatin.  To help prevent nausea and vomiting after your treatment, we encourage you to take your nausea medication.   If you develop nausea and vomiting that is not controlled by your nausea medication, call the clinic.   BELOW ARE SYMPTOMS THAT SHOULD BE REPORTED IMMEDIATELY:  *FEVER GREATER THAN 100.5 F  *CHILLS WITH OR WITHOUT FEVER  NAUSEA AND VOMITING THAT IS NOT CONTROLLED WITH YOUR NAUSEA MEDICATION  *UNUSUAL SHORTNESS OF BREATH  *UNUSUAL BRUISING OR BLEEDING  TENDERNESS IN MOUTH AND THROAT WITH OR WITHOUT PRESENCE OF ULCERS  *URINARY PROBLEMS  *BOWEL PROBLEMS  UNUSUAL RASH Items with * indicate a potential emergency and should be followed up as soon as possible.  Feel free to call the clinic you have any questions or concerns. The clinic phone number is [phone].    

## 2013-01-03 NOTE — Telephone Encounter (Signed)
gv and printed appt sched adn avs forpt...emailed MW to add more tx.

## 2013-01-03 NOTE — Patient Instructions (Signed)
CURRENT THERAPY:  Systemic chemotherapy with carboplatin for AUC of 5 and Alimta 500 mg/M2 every 3 weeks, status post 4 cycles. Last given on May, 27, 2014.  CHEMOTHERAPY INTENT: Palliative  CURRENT # OF CHEMOTHERAPY CYCLES: 4CURRENT ANTIEMETICS: Zofran , dexamethasone  CURRENT SMOKING STATUS: Former smoker, quit 09/28/2012  ORAL CHEMOTHERAPY AND CONSENT: n/a  CURRENT BISPHOSPHONATES USE: none  PAIN MANAGEMENT: Percocet  NARCOTICS INDUCED CONSTIPATION: none  LIVING WILL AND CODE STATUS: No code Blue.

## 2013-01-04 NOTE — Progress Notes (Signed)
01/03/2013 Research samples were drawn from port by Chauncy Lean RN. Cindy S. Clelia Croft BSN, RN, CCRP 01/04/2013 10:14 AM

## 2013-01-07 ENCOUNTER — Telehealth: Payer: Self-pay | Admitting: Medical Oncology

## 2013-01-07 NOTE — Telephone Encounter (Signed)
HOme Healht RN saw pt this am and is now calling to report pt told her he had  sharp pains 8/10 in left lower back with deep breath.Nurse reported his BP = 116/60. Pulse 64 oxygen sat= 94% and resp 18.  I called pt and spoke to wife. She said pt is in his shop out in the back of the house. I told her to tell him to go to ED if symptoms worsen or he has more difficulty breathing. She voices understanding. Nurse reported his BP = 116/60. Pulse 64 oxygen sat= 94% and resp 18

## 2013-01-11 ENCOUNTER — Other Ambulatory Visit (HOSPITAL_BASED_OUTPATIENT_CLINIC_OR_DEPARTMENT_OTHER): Payer: Medicare Other | Admitting: Lab

## 2013-01-11 DIAGNOSIS — C3410 Malignant neoplasm of upper lobe, unspecified bronchus or lung: Secondary | ICD-10-CM

## 2013-01-11 LAB — COMPREHENSIVE METABOLIC PANEL (CC13)
ALT: 26 U/L (ref 0–55)
AST: 29 U/L (ref 5–34)
Alkaline Phosphatase: 102 U/L (ref 40–150)
Creatinine: 0.9 mg/dL (ref 0.7–1.3)
Sodium: 137 mEq/L (ref 136–145)
Total Bilirubin: 0.45 mg/dL (ref 0.20–1.20)
Total Protein: 7.6 g/dL (ref 6.4–8.3)

## 2013-01-11 LAB — CBC WITH DIFFERENTIAL/PLATELET
BASO%: 0.7 % (ref 0.0–2.0)
EOS%: 0.7 % (ref 0.0–7.0)
Eosinophils Absolute: 0 10*3/uL (ref 0.0–0.5)
LYMPH%: 20.8 % (ref 14.0–49.0)
MCH: 31.2 pg (ref 27.2–33.4)
MCHC: 33.6 g/dL (ref 32.0–36.0)
MCV: 92.8 fL (ref 79.3–98.0)
MONO%: 11.4 % (ref 0.0–14.0)
NEUT#: 1 10*3/uL — ABNORMAL LOW (ref 1.5–6.5)
RBC: 2.37 10*6/uL — ABNORMAL LOW (ref 4.20–5.82)
RDW: 18.5 % — ABNORMAL HIGH (ref 11.0–14.6)
nRBC: 0 % (ref 0–0)

## 2013-01-14 ENCOUNTER — Other Ambulatory Visit: Payer: Medicare Other | Admitting: Lab

## 2013-01-14 ENCOUNTER — Other Ambulatory Visit: Payer: Self-pay | Admitting: Physician Assistant

## 2013-01-14 ENCOUNTER — Ambulatory Visit (HOSPITAL_COMMUNITY)
Admission: RE | Admit: 2013-01-14 | Discharge: 2013-01-14 | Disposition: A | Discharge status: Home / Self Care | Payer: Medicare Other | Source: Ambulatory Visit | Attending: Internal Medicine | Admitting: Internal Medicine

## 2013-01-14 ENCOUNTER — Other Ambulatory Visit: Payer: Self-pay | Admitting: Medical Oncology

## 2013-01-14 ENCOUNTER — Telehealth: Payer: Self-pay | Admitting: Medical Oncology

## 2013-01-14 DIAGNOSIS — C3410 Malignant neoplasm of upper lobe, unspecified bronchus or lung: Secondary | ICD-10-CM

## 2013-01-14 DIAGNOSIS — D649 Anemia, unspecified: Secondary | ICD-10-CM

## 2013-01-14 LAB — CBC WITH DIFFERENTIAL/PLATELET
BASO%: 0.4 % (ref 0.0–2.0)
Eosinophils Absolute: 0 10*3/uL (ref 0.0–0.5)
HCT: 20.6 % — ABNORMAL LOW (ref 38.4–49.9)
LYMPH%: 23.2 % (ref 14.0–49.0)
MCHC: 35.3 g/dL (ref 32.0–36.0)
MCV: 91.7 fL (ref 79.3–98.0)
MONO#: 0.3 10*3/uL (ref 0.1–0.9)
MONO%: 15.9 % — ABNORMAL HIGH (ref 0.0–14.0)
NEUT%: 60 % (ref 39.0–75.0)
Platelets: 65 10*3/uL — ABNORMAL LOW (ref 140–400)
RBC: 2.25 10*6/uL — ABNORMAL LOW (ref 4.20–5.82)

## 2013-01-14 MED ORDER — CIPROFLOXACIN HCL 500 MG PO TABS: 500.0000 mg | ORAL_TABLET | Freq: Two times a day (BID) | ORAL | Status: DC

## 2013-01-14 NOTE — Telephone Encounter (Signed)
Called in cipro to pharmacy and pt notified.

## 2013-01-14 NOTE — Progress Notes (Signed)
Pt had several nosebleeds yesterday that lasted 10 minutes . Pt scheudled today for CBC and clot to hold.

## 2013-01-14 NOTE — Progress Notes (Signed)
Pt notifed of CBC results and HAR done . Pt notifed of appt tomorrow for blood tranasfusion.

## 2013-01-14 NOTE — Telephone Encounter (Signed)
Pt had another nose bleed last night . Coming in for CBC today and possible blood transfusion tomorrow.

## 2013-01-15 ENCOUNTER — Ambulatory Visit (HOSPITAL_BASED_OUTPATIENT_CLINIC_OR_DEPARTMENT_OTHER): Payer: Medicare Other

## 2013-01-15 VITALS — BP 117/56 | HR 86 | Temp 97.4°F | Resp 24

## 2013-01-15 DIAGNOSIS — D649 Anemia, unspecified: Secondary | ICD-10-CM

## 2013-01-15 LAB — PREPARE RBC (CROSSMATCH)

## 2013-01-15 MED ORDER — ACETAMINOPHEN 325 MG PO TABS
ORAL_TABLET | ORAL | Status: AC
  Filled 2013-01-15: qty 2

## 2013-01-15 MED ORDER — FUROSEMIDE 10 MG/ML IJ SOLN
20.0000 mg | Freq: Once | INTRAMUSCULAR | Status: AC
  Administered 2013-01-15: 20 mg via INTRAMUSCULAR

## 2013-01-15 MED ORDER — ACETAMINOPHEN 325 MG PO TABS
650.0000 mg | ORAL_TABLET | Freq: Once | ORAL | Status: AC
Start: 2013-01-15 — End: 2013-01-15
  Administered 2013-01-15: 650 mg via ORAL

## 2013-01-15 MED ORDER — DIPHENHYDRAMINE HCL 50 MG/ML IJ SOLN
INTRAMUSCULAR | Status: AC
  Filled 2013-01-15: qty 1

## 2013-01-15 MED ORDER — DIPHENHYDRAMINE HCL 50 MG/ML IJ SOLN
25.0000 mg | Freq: Once | INTRAMUSCULAR | Status: AC
  Administered 2013-01-15: 25 mg via INTRAVENOUS

## 2013-01-15 MED ORDER — SODIUM CHLORIDE 0.9 % IV SOLN
250.0000 mL | Freq: Once | INTRAVENOUS | Status: AC
  Administered 2013-01-15: 250 mL via INTRAVENOUS

## 2013-01-15 NOTE — Patient Instructions (Signed)
Blood Transfusion Information WHAT IS A BLOOD TRANSFUSION? A transfusion is the replacement of blood or some of its parts. Blood is made up of multiple cells which provide different functions.  Red blood cells carry oxygen and are used for blood loss replacement.  White blood cells fight against infection.  Platelets control bleeding.  Plasma helps clot blood.  Other blood products are available for specialized needs, such as hemophilia or other clotting disorders. BEFORE THE TRANSFUSION  Who gives blood for transfusions?   You may be able to donate blood to be used at a later date on yourself (autologous donation).  Relatives can be asked to donate blood. This is generally not any safer than if you have received blood from a stranger. The same precautions are taken to ensure safety when a relative's blood is donated.  Healthy volunteers who are fully evaluated to make sure their blood is safe. This is blood bank blood. Transfusion therapy is the safest it has ever been in the practice of medicine. Before blood is taken from a donor, a complete history is taken to make sure that person has no history of diseases nor engages in risky social behavior (examples are intravenous drug use or sexual activity with multiple partners). The donor's travel history is screened to minimize risk of transmitting infections, such as malaria. The donated blood is tested for signs of infectious diseases, such as HIV and hepatitis. The blood is then tested to be sure it is compatible with you in order to minimize the chance of a transfusion reaction. If you or a relative donates blood, this is often done in anticipation of surgery and is not appropriate for emergency situations. It takes many days to process the donated blood. RISKS AND COMPLICATIONS Although transfusion therapy is very safe and saves many lives, the main dangers of transfusion include:   Getting an infectious disease.  Developing a  transfusion reaction. This is an allergic reaction to something in the blood you were given. Every precaution is taken to prevent this. The decision to have a blood transfusion has been considered carefully by your caregiver before blood is given. Blood is not given unless the benefits outweigh the risks. AFTER THE TRANSFUSION  Right after receiving a blood transfusion, you will usually feel much better and more energetic. This is especially true if your red blood cells have gotten low (anemic). The transfusion raises the level of the red blood cells which carry oxygen, and this usually causes an energy increase.  The nurse administering the transfusion will monitor you carefully for complications. HOME CARE INSTRUCTIONS  No special instructions are needed after a transfusion. You may find your energy is better. Speak with your caregiver about any limitations on activity for underlying diseases you may have. SEEK MEDICAL CARE IF:   Your condition is not improving after your transfusion.  You develop redness or irritation at the intravenous (IV) site. SEEK IMMEDIATE MEDICAL CARE IF:  Any of the following symptoms occur over the next 12 hours:  Shaking chills.  You have a temperature by mouth above 102 F (38.9 C), not controlled by medicine.  Chest, back, or muscle pain.  People around you feel you are not acting correctly or are confused.  Shortness of breath or difficulty breathing.  Dizziness and fainting.  You get a rash or develop hives.  You have a decrease in urine output.  Your urine turns a dark color or changes to pink, red, or brown. Any of the following   symptoms occur over the next 10 days:  You have a temperature by mouth above 102 F (38.9 C), not controlled by medicine.  Shortness of breath.  Weakness after normal activity.  The white part of the eye turns yellow (jaundice).  You have a decrease in the amount of urine or are urinating less often.  Your  urine turns a dark color or changes to pink, red, or brown. Document Released: 04/25/2000 Document Revised: 07/21/2011 Document Reviewed: 12/13/2007 [employer] Patient Information 2014 [employer].  

## 2013-01-16 ENCOUNTER — Encounter: Payer: Self-pay | Admitting: Radiation Oncology

## 2013-01-16 LAB — TYPE AND SCREEN
Antibody Screen: NEGATIVE
Unit division: 0

## 2013-01-16 NOTE — Progress Notes (Signed)
Radiation Oncology         [phone] ________________________________  Name: Luis Payne MRN: [id number]  Date: 01/17/2013  DOB: [birth year]  Multidisciplinary Neuro Oncology Clinic Follow-Up Visit Note  CC: Cassell Smiles., MD    Shirlean Kelly MD  Diagnosis:   72 year old gentleman with brain metastases s/p radiosurgery: 08/26/12 Right Parietal 5 mm and Left Parietal 5 mm target treated to 20 Gy.  12/16/2012 Left cerebellar 6 mm and Right frontal 2 mm targets treated to 20 Gy.  Interval Since Last Radiation:  4  weeks  Narrative:  The patient returns today for routine follow-up.   He had transfusion for Hgb 7.3.  Otherwise he is doing well.                              ALLERGIES:  has No Known Allergies.  Meds: Current Outpatient Prescriptions  Medication Sig Dispense Refill  . Ascorbic Acid (VITAMIN C) 1000 MG tablet Take 1,000 mg by mouth daily.      . carvedilol (COREG) 3.125 MG tablet Take 1 tablet (3.125 mg total) by mouth 2 (two) times daily.  180 tablet  3  . cholecalciferol (VITAMIN D) 1000 UNITS tablet Take 1,000 Units by mouth every morning.      . ciprofloxacin (CIPRO) 500 MG tablet Take 1 tablet (500 mg total) by mouth 2 (two) times daily.  10 tablet  0  . digoxin (LANOXIN) 0.125 MG tablet Take 1 tablet (0.125 mg total) by mouth daily.  90 tablet  3  . feeding supplement (ENSURE COMPLETE) LIQD Take 237 mLs by mouth 2 (two) times daily between meals.  30 Bottle  5  . folic acid (FOLVITE) 1 MG tablet Take 1 tablet (1 mg total) by mouth daily.  30 tablet  4  . furosemide (LASIX) 20 MG tablet Take 1 tablet (20 mg total) by mouth daily.  30 tablet  3  . guaiFENesin (MUCINEX) 600 MG 12 hr tablet Take 600 mg by mouth daily.      Marland Kitchen levalbuterol (XOPENEX) 0.63 MG/3ML nebulizer solution Take 3 mLs (0.63 mg total) by nebulization every 4 (four) hours as needed for wheezing.  3 mL  120  . lisinopril (PRINIVIL,ZESTRIL) 2.5 MG tablet Take 1 tablet (2.5 mg total) by mouth daily.   90 tablet  3  . Multiple Vitamin (MULTIVITAMIN WITH MINERALS) TABS Take 1 tablet by mouth every morning.      . OXYGEN-HELIUM IN Inhale 3 L into the lungs daily.      Marland Kitchen PRESCRIPTION MEDICATION Inject into the vein every 21 ( twenty-one) days. Alimta and Paraplatin infusion q21d.      Marland Kitchen dexamethasone (DECADRON) 4 MG tablet Take 4 mg by mouth 2 (two) times daily with a meal. Day before, day of and day after chemo.      Marland Kitchen oxyCODONE-acetaminophen (PERCOCET/ROXICET) 5-325 MG per tablet Take 1 tablet by mouth every 6 (six) hours as needed for pain.  90 tablet  0   No current facility-administered medications for this encounter.    Physical Findings: The patient is in no acute distress. Patient is alert and oriented.  weight is 123 lb 6.4 oz (55.974 kg). His oral temperature is 97.7 F (36.5 C). His blood pressure is 117/58 and his pulse is 88. His respiration is 18 and oxygen saturation is 100%. .  No significant changes.  Lab Findings: Lab Results  Component Value Date   WBC  1.8* 01/17/2013   HGB 10.4* 01/17/2013   HCT 29.2* 01/17/2013   MCV 90.7 01/17/2013   PLT 29* 01/17/2013   Impression:  The patient is recovering from the effects of radiation.   Plan:  Brain MRI in 2 months then f/u with Dr. Newell Coral.  _____________________________________  [name]. Kathrynn Running, M.D.

## 2013-01-17 ENCOUNTER — Encounter: Payer: Self-pay | Admitting: Radiation Oncology

## 2013-01-17 ENCOUNTER — Other Ambulatory Visit (HOSPITAL_BASED_OUTPATIENT_CLINIC_OR_DEPARTMENT_OTHER): Payer: Medicare Other

## 2013-01-17 ENCOUNTER — Ambulatory Visit
Admission: RE | Admit: 2013-01-17 | Discharge: 2013-01-17 | Disposition: A | Discharge status: Home / Self Care | Payer: Medicare Other | Source: Ambulatory Visit | Attending: Radiation Oncology | Admitting: Radiation Oncology

## 2013-01-17 VITALS — BP 117/58 | HR 88 | Temp 97.7°F | Resp 18 | Wt 123.4 lb

## 2013-01-17 DIAGNOSIS — D649 Anemia, unspecified: Secondary | ICD-10-CM

## 2013-01-17 DIAGNOSIS — C7931 Secondary malignant neoplasm of brain: Secondary | ICD-10-CM

## 2013-01-17 LAB — COMPREHENSIVE METABOLIC PANEL (CC13)
ALT: 25 U/L (ref 0–55)
AST: 27 U/L (ref 5–34)
Alkaline Phosphatase: 117 U/L (ref 40–150)
Calcium: 10 mg/dL (ref 8.4–10.4)
Chloride: 100 mEq/L (ref 98–109)
Creatinine: 1 mg/dL (ref 0.7–1.3)
Total Bilirubin: 0.6 mg/dL (ref 0.20–1.20)

## 2013-01-17 LAB — CBC WITH DIFFERENTIAL/PLATELET
BASO%: 0.5 % (ref 0.0–2.0)
EOS%: 0.9 % (ref 0.0–7.0)
HCT: 29.2 % — ABNORMAL LOW (ref 38.4–49.9)
MCH: 32.2 pg (ref 27.2–33.4)
MCHC: 35.5 g/dL (ref 32.0–36.0)
MONO%: 21.2 % — ABNORMAL HIGH (ref 0.0–14.0)
NEUT%: 58.1 % (ref 39.0–75.0)
lymph#: 0.4 10*3/uL — ABNORMAL LOW (ref 0.9–3.3)

## 2013-01-17 LAB — HOLD TUBE, BLOOD BANK

## 2013-01-17 NOTE — Progress Notes (Signed)
Reports he received two units of blood Saturday which brought his h and h up from 10.4/29.2 to 7.3/20.6. Reports he doesn't feel as weak as he did prior to blood transfusion. Intermittent productive cough noted. Oxygen therapy 2.5 liter via nasal cannula noted. Denies headache, dizziness, nausea, or vomiting. Denies diplopia or hearing changes. Denies pain.

## 2013-01-21 ENCOUNTER — Telehealth: Payer: Self-pay | Admitting: Medical Oncology

## 2013-01-21 NOTE — Telephone Encounter (Signed)
Having  small amount bleeding when he blows his nose several times a day. Otherwise he is okay . I told the nurse to tell pt to keep his appt Monday.

## 2013-01-24 ENCOUNTER — Encounter: Payer: Self-pay | Admitting: Physician Assistant

## 2013-01-24 ENCOUNTER — Telehealth: Payer: Self-pay | Admitting: Internal Medicine

## 2013-01-24 ENCOUNTER — Ambulatory Visit: Payer: Medicare Other

## 2013-01-24 ENCOUNTER — Other Ambulatory Visit: Payer: Medicare Other | Admitting: Lab

## 2013-01-24 ENCOUNTER — Other Ambulatory Visit (HOSPITAL_BASED_OUTPATIENT_CLINIC_OR_DEPARTMENT_OTHER): Payer: Medicare Other | Admitting: Lab

## 2013-01-24 ENCOUNTER — Ambulatory Visit: Payer: Medicare Other | Admitting: Cardiovascular Disease

## 2013-01-24 ENCOUNTER — Encounter: Payer: Self-pay | Admitting: *Deleted

## 2013-01-24 ENCOUNTER — Ambulatory Visit (HOSPITAL_BASED_OUTPATIENT_CLINIC_OR_DEPARTMENT_OTHER): Payer: Medicare Other | Admitting: Physician Assistant

## 2013-01-24 ENCOUNTER — Other Ambulatory Visit: Payer: Self-pay

## 2013-01-24 ENCOUNTER — Ambulatory Visit (HOSPITAL_BASED_OUTPATIENT_CLINIC_OR_DEPARTMENT_OTHER): Payer: Medicare Other

## 2013-01-24 DIAGNOSIS — C7931 Secondary malignant neoplasm of brain: Secondary | ICD-10-CM

## 2013-01-24 DIAGNOSIS — C3410 Malignant neoplasm of upper lobe, unspecified bronchus or lung: Secondary | ICD-10-CM

## 2013-01-24 DIAGNOSIS — Z5111 Encounter for antineoplastic chemotherapy: Secondary | ICD-10-CM

## 2013-01-24 DIAGNOSIS — D6481 Anemia due to antineoplastic chemotherapy: Secondary | ICD-10-CM

## 2013-01-24 DIAGNOSIS — F17200 Nicotine dependence, unspecified, uncomplicated: Secondary | ICD-10-CM

## 2013-01-24 LAB — CBC WITH DIFFERENTIAL/PLATELET
BASO%: 0.1 % (ref 0.0–2.0)
EOS%: 0 % (ref 0.0–7.0)
HCT: 29.4 % — ABNORMAL LOW (ref 38.4–49.9)
LYMPH%: 4.6 % — ABNORMAL LOW (ref 14.0–49.0)
MCH: 31.4 pg (ref 27.2–33.4)
MCHC: 34.4 g/dL (ref 32.0–36.0)
MCV: 91.3 fL (ref 79.3–98.0)
NEUT%: 78.7 % — ABNORMAL HIGH (ref 39.0–75.0)
Platelets: 154 10*3/uL (ref 140–400)

## 2013-01-24 LAB — COMPREHENSIVE METABOLIC PANEL (CC13)
AST: 24 U/L (ref 5–34)
Alkaline Phosphatase: 112 U/L (ref 40–150)
BUN: 20.5 mg/dL (ref 7.0–26.0)
Calcium: 10.3 mg/dL (ref 8.4–10.4)
Creatinine: 1 mg/dL (ref 0.7–1.3)
Glucose: 109 mg/dl (ref 70–140)

## 2013-01-24 MED ORDER — DEXAMETHASONE SODIUM PHOSPHATE 20 MG/5ML IJ SOLN
20.0000 mg | Freq: Once | INTRAMUSCULAR | Status: AC
  Administered 2013-01-24: 20 mg via INTRAVENOUS

## 2013-01-24 MED ORDER — ONDANSETRON 16 MG/50ML IVPB (CHCC)
16.0000 mg | Freq: Once | INTRAVENOUS | Status: AC
  Administered 2013-01-24: 16 mg via INTRAVENOUS

## 2013-01-24 MED ORDER — ONDANSETRON 16 MG/50ML IVPB (CHCC)
INTRAVENOUS | Status: AC
  Filled 2013-01-24: qty 16

## 2013-01-24 MED ORDER — SODIUM CHLORIDE 0.9 % IV SOLN
500.0000 mg/m2 | Freq: Once | INTRAVENOUS | Status: AC
  Administered 2013-01-24: 800 mg via INTRAVENOUS
  Filled 2013-01-24: qty 32

## 2013-01-24 MED ORDER — CYANOCOBALAMIN 1000 MCG/ML IJ SOLN
INTRAMUSCULAR | Status: AC
  Filled 2013-01-24: qty 1

## 2013-01-24 MED ORDER — SODIUM CHLORIDE 0.9 % IV SOLN
387.0000 mg | Freq: Once | INTRAVENOUS | Status: AC
  Administered 2013-01-24: 390 mg via INTRAVENOUS
  Filled 2013-01-24: qty 39

## 2013-01-24 MED ORDER — SODIUM CHLORIDE 0.9 % IV SOLN
Freq: Once | INTRAVENOUS | Status: AC
  Administered 2013-01-24: 12:00:00 via INTRAVENOUS

## 2013-01-24 MED ORDER — DEXAMETHASONE SODIUM PHOSPHATE 20 MG/5ML IJ SOLN
INTRAMUSCULAR | Status: AC
  Filled 2013-01-24: qty 5

## 2013-01-24 MED ORDER — CYANOCOBALAMIN 1000 MCG/ML IJ SOLN
1000.0000 ug | Freq: Once | INTRAMUSCULAR | Status: AC
  Administered 2013-01-24: 1000 ug via INTRAMUSCULAR

## 2013-01-24 NOTE — Progress Notes (Addendum)
[facility] [phone]   [phone]  OFFICE PROGRESS NOTE  Cassell Smiles., MD [address] [place] [address]  DIAGNOSIS: Metastatic non-small cell lung cancer, adenocarcinoma with negative EGFR mutation and negative ALK gene translocation diagnosed in April 2014   PRIOR THERAPY:  1. Status post stereotactic radiotherapy to 2 brain lesions under the care of Dr. Kathrynn Running.  2. Status post palliative radiotherapy to the left lung mass.   CURRENT THERAPY:  Systemic chemotherapy with carboplatin for AUC of 5 and Alimta 500 mg/M2 every 3 weeks, status post 5 cycles.   CHEMOTHERAPY INTENT: Palliative  CURRENT # OF CHEMOTHERAPY CYCLES: 6 CURRENT ANTIEMETICS: Zofran , dexamethasone  CURRENT SMOKING STATUS: Former smoker, quit 09/28/2012  ORAL CHEMOTHERAPY AND CONSENT: n/a  CURRENT BISPHOSPHONATES USE: none  PAIN MANAGEMENT: Percocet  NARCOTICS INDUCED CONSTIPATION: none  LIVING WILL AND CODE STATUS: No code Blue.   INTERVAL HISTORY: Luis Payne 72 y.o. male returns to the clinic today for followup visit accompanied his wife. He had a few days of generalized malaise however felt significantly better after receiving his blood transfusion. He also had some nausea for one 2 days but this was well-controlled with his current antibiotic. He has no specific complaints today. The patient is tolerating his treatment fairly well with no significant adverse effects except for fatigue. He denied having any significant chest pain, shortness of breath, cough or hemoptysis. He has no fever or chills. He has no weight loss or night sweats. He is here today to start cycle #6 of his chemotherapy.  MEDICAL HISTORY: Past Medical History  Diagnosis Date  . Substance abuse     TOBACCO  . Lung mass   . 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 Brain metastases 08/18/12    mri-  . HOH (hard of hearing)     wears hearing aids  . COPD  (chronic obstructive pulmonary disease)   . Systolic heart failure     EF of 30 to 35% per echo in June 2014    ALLERGIES:  has No Known Allergies.  MEDICATIONS:  Current Outpatient Prescriptions  Medication Sig Dispense Refill  . Ascorbic Acid (VITAMIN C) 1000 MG tablet Take 1,000 mg by mouth daily.      . carvedilol (COREG) 3.125 MG tablet Take 1 tablet (3.125 mg total) by mouth 2 (two) times daily.  180 tablet  3  . cholecalciferol (VITAMIN D) 1000 UNITS tablet Take 1,000 Units by mouth every morning.      . ciprofloxacin (CIPRO) 500 MG tablet Take 1 tablet (500 mg total) by mouth 2 (two) times daily.  10 tablet  0  . dexamethasone (DECADRON) 4 MG tablet Take 4 mg by mouth 2 (two) times daily with a meal. Day before, day of and day after chemo.      . digoxin (LANOXIN) 0.125 MG tablet Take 1 tablet (0.125 mg total) by mouth daily.  90 tablet  3  . feeding supplement (ENSURE COMPLETE) LIQD Take 237 mLs by mouth 2 (two) times daily between meals.  30 Bottle  5  . folic acid (FOLVITE) 1 MG tablet Take 1 tablet (1 mg total) by mouth daily.  30 tablet  4  . furosemide (LASIX) 20 MG tablet Take 1 tablet (20 mg total) by mouth daily.  30 tablet  3  . guaiFENesin (MUCINEX) 600 MG 12 hr tablet Take 600 mg by mouth daily.      Marland Kitchen  levalbuterol (XOPENEX) 0.63 MG/3ML nebulizer solution Take 3 mLs (0.63 mg total) by nebulization every 4 (four) hours as needed for wheezing.  3 mL  120  . lisinopril (PRINIVIL,ZESTRIL) 2.5 MG tablet Take 1 tablet (2.5 mg total) by mouth daily.  90 tablet  3  . Multiple Vitamin (MULTIVITAMIN WITH MINERALS) TABS Take 1 tablet by mouth every morning.      Marland Kitchen oxyCODONE-acetaminophen (PERCOCET/ROXICET) 5-325 MG per tablet Take 1 tablet by mouth every 6 (six) hours as needed for pain.  90 tablet  0  . OXYGEN-HELIUM IN Inhale 3 L into the lungs daily.      Marland Kitchen PRESCRIPTION MEDICATION Inject into the vein every 21 ( twenty-one) days. Alimta and Paraplatin infusion q21d.       No  current facility-administered medications for this visit.    SURGICAL HISTORY:  Past Surgical History  Procedure Laterality Date  . Wrist surgery    . Lung biopsy Left 08/17/12    LUL lung mass-adenocarcinoma    REVIEW OF SYSTEMS:  A comprehensive review of systems was negative except for: Constitutional: positive for fatigue   PHYSICAL EXAMINATION: General appearance: alert, cooperative, fatigued and no distress Head: Normocephalic, without obvious abnormality, atraumatic Neck: no adenopathy Lymph nodes: Cervical, supraclavicular, and axillary nodes normal. Resp: clear to auscultation bilaterally Cardio: regular rate and rhythm, S1, S2 normal, no murmur, click, rub or gallop GI: soft, non-tender; bowel sounds normal; no masses,  no organomegaly Extremities: extremities normal, atraumatic, no cyanosis or edema  ECOG PERFORMANCE STATUS: 1 - Symptomatic but completely ambulatory  Blood pressure 112/67, pulse 86, temperature 96.8 F (36 C), temperature source Oral, resp. rate 18, height 5\' 5"  (1.651 m), weight 120 lb 8 oz (54.658 kg).  LABORATORY DATA: Lab Results  Component Value Date   WBC 6.7 01/24/2013   HGB 10.1* 01/24/2013   HCT 29.4* 01/24/2013   MCV 91.3 01/24/2013   PLT 154 01/24/2013      Chemistry      Component Value Date/Time   NA 137 01/17/2013 0912   NA 137 10/22/2012 0448   K 4.5 01/17/2013 0912   K 4.2 10/22/2012 0448   CL 94* 11/01/2012 1105   CL 98 10/22/2012 0448   CO2 28 01/17/2013 0912   CO2 32 10/22/2012 0448   BUN 24.9 01/17/2013 0912   BUN 26* 10/22/2012 0448   CREATININE 1.0 01/17/2013 0912   CREATININE 0.80 10/22/2012 0448      Component Value Date/Time   CALCIUM 10.0 01/17/2013 0912   CALCIUM 9.3 10/22/2012 0448   ALKPHOS 117 01/17/2013 0912   ALKPHOS 128* 10/18/2012 0500   AST 27 01/17/2013 0912   AST 40* 10/18/2012 0500   ALT 25 01/17/2013 0912   ALT 34 10/18/2012 0500   BILITOT 0.60 01/17/2013 0912   BILITOT 0.9 10/18/2012 0500       RADIOGRAPHIC STUDIES: Ct  Chest W Contrast  12/10/2012   *RADIOLOGY REPORT*  Clinical Data:  Restaging metastatic colon cancer  CT CHEST, ABDOMEN AND PELVIS WITH CONTRAST  Technique:  Multidetector CT imaging of the chest, abdomen and pelvis was performed following the standard protocol during bolus administration of intravenous contrast.  Contrast: OMNIPAQUE IOHEXOL 300 MG/ML  SOLN  Comparison:  CT 07/23/2012, PET CT 08/02/2012    CT CHEST  Findings:  The left upper lobe mass is decreased in volume measuring 4.3 x 3.9 cm in axial dimension (image 11) compared to 5.6 x 4.8 cm on prior.  More inferiorly in  the left upper lobe there is new air space disease superimposed on chronic interstitial lung disease.  There is new consolidation in the superior segment of the left lower lobe.  These findings are likely relate to radiation therapy.  A small focus of consolidation / atelectasis at the left lung base measures 2.5 x 2.2 cm (image 34).  In the right lower lobe there is increased consolidation medially with air bronchograms as well as in the right upper lobe along the oblique fissure.  No axillary supraclavicular lymphadenopathy.  No mediastinal lymphadenopathy.  8 mm right lower paratracheal lymph node is slightly increased from 6 mm on prior.  IMPRESSION:  1.  Interval contraction of the left upper lobe mass suggests positive chemo radiation therapy effect. 2.  New consolidation within the lingula, left lower lobe, and right lower lobe likely represents postradiation change.  Cannot exclude superimposed infection. 3.  Severe interstitial lung disease and emphysema. 4.  Slight enlargement of a small paratracheal lymph node is likely reactive.    CT ABDOMEN AND PELVIS  Findings:  No focal hepatic lesion in the.  The gallbladder, pancreas, spleen, adrenal glands, and kidneys are normal.  There are multiple calculi within the left and right kidney which are nonobstructive.  The stomach, small bowel, appendix, and cecum are normal.  The  colon and rectosigmoid colon are normal.  Abdominal aorta is normal caliber.  No retroperitoneal periportal lymphadenopathy.  IMPRESSION:  1.  No evidence of metastasis in the abdomen or pelvis. 2.  Bilateral nephrolithiasis.   Original Report Authenticated By: Genevive Bi, M.D.   ASSESSMENT AND PLAN: This is a very pleasant 72 years old white male with history of metastatic non-small cell lung cancer currently on systemic chemotherapy with carboplatin and Alimta status post 5 cycles and tolerating his treatment fairly well except for fatigue from chemotherapy-induced anemia. Patient's hemoglobin today is 10.1 g/dL. Patient was discussed with also seen by Dr. Arbutus Ped. He will proceed with cycle #6 of his systemic chemotherapy with carboplatin and Alimta as scheduled. He'll continue with weekly labs as scheduled. He'll followup with Dr. Arbutus Ped in 3 weeks with a restaging CT scan of the chest, abdomen and pelvis with contrast to reevaluate his disease.  Laural Benes, Cristela Stalder E, PA-C   He was advised to call immediately if he has any concerning symptoms and interval. The patient voices understanding of current disease status and treatment options and is in agreement with the current care plan.  All questions were answered. The patient knows to call the clinic with any problems, questions or concerns. We can certainly see the patient much sooner if necessary.   ADDENDUM: Hematology/Oncology Attending: I have a face-to-face encounter with the patient today. I recommended his care. The patient is a very pleasant 72 years old white male with metastatic non-small cell lung cancer currently undergoing systemic chemotherapy with carboplatin and Alimta status post 5 cycles. He is tolerating his treatment fairly well except for chemotherapy-induced anemia that required PRBCs transfusion. The patient is feeling much better today. We will proceed with cycle #6 today as scheduled. He would come back for followup  visit in 3 weeks with repeat CT scan of the chest, abdomen and pelvis for restaging of his disease. He was advised to call immediately if he has any concerning symptoms in the interval. Lajuana Matte., MD 01/24/2013

## 2013-01-24 NOTE — Patient Instructions (Signed)
Continue weekly labs as scheduled Followup with Dr. Mohamed in 3 weeks with a restaging CT scan of your chest, abdomen and pelvis to reevaluate your disease. 

## 2013-01-24 NOTE — Telephone Encounter (Signed)
[facility] PT APPT SCHEDULE FOR September AND October. ALSO S/W CHERYL IN CENTRAL TO MOVED CT TO 10/2 AS 10/6 IS DAY OF F/U.

## 2013-01-24 NOTE — Progress Notes (Signed)
01/24/2013 Patient in to clinic today for anticipated start of next treatment cycle. The patient completed the Patient Reported Outcomes (PROs) independently, including the EQ-5D-3L followed by the Lung Cancer Symptom Scale (LCSS), following his lab visit, but prior to his provider appointment. Following completion, patient identifiers were added to the EQ-5D-3L questionnaire. Patient aware that next study visit will occur in approximately 3 weeks, depending on treatment schedule. At that time, PROs and research blood samples will be collected. Thanked patient for his participation in the research study. Cindy S. Clelia Croft BSN, RN, CCRP 01/24/2013 11:35 AM

## 2013-01-24 NOTE — Patient Instructions (Addendum)
[facility] Discharge Instructions for Patients Receiving Chemotherapy  Today you received the following chemotherapy agents Alimta and Carboplatin.  To help prevent nausea and vomiting after your treatment, we encourage you to take your nausea medication.   If you develop nausea and vomiting that is not controlled by your nausea medication, call the clinic.   BELOW ARE SYMPTOMS THAT SHOULD BE REPORTED IMMEDIATELY:  *FEVER GREATER THAN 100.5 F  *CHILLS WITH OR WITHOUT FEVER  NAUSEA AND VOMITING THAT IS NOT CONTROLLED WITH YOUR NAUSEA MEDICATION  *UNUSUAL SHORTNESS OF BREATH  *UNUSUAL BRUISING OR BLEEDING  TENDERNESS IN MOUTH AND THROAT WITH OR WITHOUT PRESENCE OF ULCERS  *URINARY PROBLEMS  *BOWEL PROBLEMS  UNUSUAL RASH Items with * indicate a potential emergency and should be followed up as soon as possible.  Feel free to call the clinic you have any questions or concerns. The clinic phone number is [phone].    

## 2013-01-27 ENCOUNTER — Other Ambulatory Visit: Payer: Self-pay | Admitting: Internal Medicine

## 2013-01-28 ENCOUNTER — Other Ambulatory Visit: Payer: Self-pay | Admitting: *Deleted

## 2013-01-28 ENCOUNTER — Telehealth: Payer: Self-pay | Admitting: *Deleted

## 2013-01-28 NOTE — Telephone Encounter (Signed)
Pt's wife called stating that BP is 105/49.  He is seeing his cardiologist next week.  Informed her to make sure he is drinking plenty of fluids and to call if there are any changes, but for now to continue to monitor and let cardiologist know.  She verbalized understanding.  SLJ

## 2013-01-31 ENCOUNTER — Other Ambulatory Visit (HOSPITAL_BASED_OUTPATIENT_CLINIC_OR_DEPARTMENT_OTHER): Payer: Medicare Other

## 2013-01-31 DIAGNOSIS — C3410 Malignant neoplasm of upper lobe, unspecified bronchus or lung: Secondary | ICD-10-CM

## 2013-01-31 LAB — CBC WITH DIFFERENTIAL/PLATELET
Basophils Absolute: 0 10*3/uL (ref 0.0–0.1)
EOS%: 0.4 % (ref 0.0–7.0)
Eosinophils Absolute: 0 10*3/uL (ref 0.0–0.5)
HCT: 26.2 % — ABNORMAL LOW (ref 38.4–49.9)
HGB: 9.3 g/dL — ABNORMAL LOW (ref 13.0–17.1)
MCH: 32.4 pg (ref 27.2–33.4)
MCV: 91.3 fL (ref 79.3–98.0)
NEUT#: 1.1 10*3/uL — ABNORMAL LOW (ref 1.5–6.5)
NEUT%: 72.6 % (ref 39.0–75.0)
lymph#: 0.3 10*3/uL — ABNORMAL LOW (ref 0.9–3.3)

## 2013-01-31 LAB — COMPREHENSIVE METABOLIC PANEL (CC13)
AST: 33 U/L (ref 5–34)
Albumin: 3.1 g/dL — ABNORMAL LOW (ref 3.5–5.0)
BUN: 32.3 mg/dL — ABNORMAL HIGH (ref 7.0–26.0)
Calcium: 9.8 mg/dL (ref 8.4–10.4)
Chloride: 97 mEq/L — ABNORMAL LOW (ref 98–109)
Creatinine: 1 mg/dL (ref 0.7–1.3)
Glucose: 113 mg/dl (ref 70–140)
Potassium: 4.6 mEq/L (ref 3.5–5.1)

## 2013-02-02 ENCOUNTER — Encounter: Payer: Self-pay | Admitting: Nurse Practitioner

## 2013-02-02 ENCOUNTER — Ambulatory Visit (INDEPENDENT_AMBULATORY_CARE_PROVIDER_SITE_OTHER): Payer: Medicare Other | Admitting: Nurse Practitioner

## 2013-02-02 VITALS — BP 108/60 | HR 80 | Ht 66.0 in | Wt 124.8 lb

## 2013-02-02 DIAGNOSIS — I509 Heart failure, unspecified: Secondary | ICD-10-CM

## 2013-02-02 DIAGNOSIS — I5022 Chronic systolic (congestive) heart failure: Secondary | ICD-10-CM

## 2013-02-02 MED ORDER — FUROSEMIDE 20 MG PO TABS: 20.0000 mg | ORAL_TABLET | Freq: Every day | ORAL | Status: DC | PRN

## 2013-02-02 NOTE — Patient Instructions (Addendum)
Stay on your current medicines but you may take the Lasix only as needed - this means for weight gain of 2 to 3 pounds overnight, swelling or more shortness of breath  I will see you in 3 months  Call the [facility] office at [phone] if you have any questions, problems or concerns.

## 2013-02-02 NOTE — Progress Notes (Signed)
Luis Payne Date of Birth: 03/04/1941 Medical Record #[id number]  History of Present Illness: Luis Payne is seen back today for a 1 month check. Seen for Dr. Elease Hashimoto. He has metastatic lung cancer with brain involvement. Has had radiation and chemo. 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 Echo at that time showed an EF down to 30 to 35%. Has been managed conservatively due to his lung cancer.   I have seen him several times - have started very low dose ACE. He has completed his radiation and almost finished with his chemo. He remains short of breath and on oxygen.   Comes back today. Here with his wife. Doing ok. Remains pretty weak. Remains short of breath. Has finished all of his chemo. Counts are pretty low. For repeat scan in early October with follow up with Dr. Shirline Frees. No chest pain. Tolerating his medicines but would like to change his Lasix. Still able to sleep flat and no longer using the recliner.   Current Outpatient Prescriptions  Medication Sig Dispense Refill  . Ascorbic Acid (VITAMIN C) 1000 MG tablet Take 1,000 mg by mouth daily.      . carvedilol (COREG) 3.125 MG tablet Take 1 tablet (3.125 mg total) by mouth 2 (two) times daily.  180 tablet  3  . cholecalciferol (VITAMIN D) 1000 UNITS tablet Take 1,000 Units by mouth every morning.      . ciprofloxacin (CIPRO) 500 MG tablet Take 1 tablet (500 mg total) by mouth 2 (two) times daily.  10 tablet  0  . dexamethasone (DECADRON) 4 MG tablet Take 4 mg by mouth 2 (two) times daily with a meal. Day before, day of and day after chemo.      . digoxin (LANOXIN) 0.125 MG tablet Take 1 tablet (0.125 mg total) by mouth daily.  90 tablet  3  . feeding supplement (ENSURE COMPLETE) LIQD Take 237 mLs by mouth 2 (two) times daily between meals.  30 Bottle  5  . folic acid (FOLVITE) 1 MG tablet TAKE 1 TABLET (1 MG TOTAL) BY MOUTH DAILY.  90 tablet  1  . furosemide (LASIX) 20 MG tablet Take 1 tablet (20 mg total) by mouth daily.  30 tablet  3  .  guaiFENesin (MUCINEX) 600 MG 12 hr tablet Take 600 mg by mouth daily.      Marland Kitchen levalbuterol (XOPENEX) 0.63 MG/3ML nebulizer solution Take 3 mLs (0.63 mg total) by nebulization every 4 (four) hours as needed for wheezing.  3 mL  120  . lisinopril (PRINIVIL,ZESTRIL) 2.5 MG tablet Take 1 tablet (2.5 mg total) by mouth daily.  90 tablet  3  . Multiple Vitamin (MULTIVITAMIN WITH MINERALS) TABS Take 1 tablet by mouth every morning.      Marland Kitchen oxyCODONE-acetaminophen (PERCOCET/ROXICET) 5-325 MG per tablet Take 1 tablet by mouth every 6 (six) hours as needed for pain.  90 tablet  0  . OXYGEN-HELIUM IN Inhale 3 L into the lungs daily.      Marland Kitchen PRESCRIPTION MEDICATION Inject into the vein every 21 ( twenty-one) days. Alimta and Paraplatin infusion q21d.       No current facility-administered medications for this visit.    No Known Allergies  Past Medical History  Diagnosis Date  . Substance abuse     TOBACCO  . Lung mass   . 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 Brain metastases 08/18/12    mri-  . HOH (hard of hearing)     wears  hearing aids  . COPD (chronic obstructive pulmonary disease)   . Systolic heart failure     EF of 30 to 35% per echo in June 2014    Past Surgical History  Procedure Laterality Date  . Wrist surgery    . Lung biopsy Left 08/17/12    LUL lung mass-adenocarcinoma    History  Smoking status  . Former Smoker -- 1.00 packs/day  . Types: Cigarettes  . Quit date: 09/28/2012  Smokeless tobacco  . Never Used    History  Alcohol Use No    Family History  Problem Relation Age of Onset  . COPD Father   . Cancer Sister   . Diabetes Brother     Review of Systems: The review of systems is per the HPI.  All other systems were reviewed and are negative.  Physical Exam: BP 108/60  Pulse 80  Ht 5\' 6"  (1.676 m)  Wt 124 lb 12.8 oz (56.609 kg)  BMI 20.15 kg/m2 Patient is alert and in no acute distress. Has oxygen in place. Remains thin but weight is steady and  up one pound since last visit. Skin is warm and dry. Color is normal.  HEENT is unremarkable. Normocephalic/atraumatic. PERRL. Sclera are nonicteric. Neck is supple. No masses. No JVD. Lungs are coarse. Cardiac exam shows a regular rate and rhythm. Abdomen is soft. Extremities are without edema. Gait and ROM are intact. No gross neurologic deficits noted.  LABORATORY DATA: Lab Results  Component Value Date   WBC 1.5* 01/31/2013   HGB 9.3* 01/31/2013   HCT 26.2* 01/31/2013   PLT 131* 01/31/2013   GLUCOSE 113 01/31/2013   ALT 32 01/31/2013   AST 33 01/31/2013   NA 134* 01/31/2013   K 4.6 01/31/2013   CL 94* 11/01/2012   CREATININE 1.0 01/31/2013   BUN 32.3* 01/31/2013   CO2 29 01/31/2013   INR 0.96 08/17/2012    Assessment / Plan: 1. Systolic HF - EF of 30 to 35% - on very low dose ACE and beta blocker. BP in the 90's at home. I do not think we have room to titrate medicines further. Overall course remains tenuous.   2. Metastatic lung cancer with brain mets - finished with radiation and chemo - for scan in early October.   3. Chronic dyspnea - suspect this is multifactorial from his cancer, CHF, emphysema,  etc. On oxygen.   I will see him back in 3 months.   Patient is agreeable to this plan and will call if any problems develop in the interim.   Rosalio Macadamia, RN, ANP-C [facility] [address] [place], [place]  [address]

## 2013-02-07 ENCOUNTER — Other Ambulatory Visit: Payer: Self-pay | Admitting: Medical Oncology

## 2013-02-07 ENCOUNTER — Ambulatory Visit (HOSPITAL_BASED_OUTPATIENT_CLINIC_OR_DEPARTMENT_OTHER): Payer: Medicare Other

## 2013-02-07 ENCOUNTER — Ambulatory Visit: Payer: Medicare Other

## 2013-02-07 ENCOUNTER — Other Ambulatory Visit (HOSPITAL_BASED_OUTPATIENT_CLINIC_OR_DEPARTMENT_OTHER): Payer: Medicare Other

## 2013-02-07 VITALS — BP 101/55 | HR 86 | Temp 97.0°F | Resp 18

## 2013-02-07 DIAGNOSIS — D649 Anemia, unspecified: Secondary | ICD-10-CM

## 2013-02-07 DIAGNOSIS — C3410 Malignant neoplasm of upper lobe, unspecified bronchus or lung: Secondary | ICD-10-CM

## 2013-02-07 LAB — COMPREHENSIVE METABOLIC PANEL (CC13)
AST: 25 U/L (ref 5–34)
Albumin: 3.1 g/dL — ABNORMAL LOW (ref 3.5–5.0)
BUN: 23.6 mg/dL (ref 7.0–26.0)
CO2: 28 mEq/L (ref 22–29)
Calcium: 10.2 mg/dL (ref 8.4–10.4)
Chloride: 101 mEq/L (ref 98–109)
Creatinine: 1 mg/dL (ref 0.7–1.3)
Glucose: 104 mg/dl (ref 70–140)
Potassium: 5.1 mEq/L (ref 3.5–5.1)

## 2013-02-07 LAB — CBC WITH DIFFERENTIAL/PLATELET
Basophils Absolute: 0 10*3/uL (ref 0.0–0.1)
EOS%: 0.5 % (ref 0.0–7.0)
Eosinophils Absolute: 0 10*3/uL (ref 0.0–0.5)
HCT: 22.3 % — ABNORMAL LOW (ref 38.4–49.9)
HGB: 7.7 g/dL — ABNORMAL LOW (ref 13.0–17.1)
MCH: 31.7 pg (ref 27.2–33.4)
MONO#: 0.3 10*3/uL (ref 0.1–0.9)
NEUT#: 1.3 10*3/uL — ABNORMAL LOW (ref 1.5–6.5)
NEUT%: 65.2 % (ref 39.0–75.0)
RDW: 19.2 % — ABNORMAL HIGH (ref 11.0–14.6)
WBC: 2 10*3/uL — ABNORMAL LOW (ref 4.0–10.3)
lymph#: 0.4 10*3/uL — ABNORMAL LOW (ref 0.9–3.3)

## 2013-02-07 LAB — PREPARE RBC (CROSSMATCH)

## 2013-02-07 MED ORDER — ACETAMINOPHEN 325 MG PO TABS
650.0000 mg | ORAL_TABLET | Freq: Once | ORAL | Status: AC
  Administered 2013-02-07: 650 mg via ORAL

## 2013-02-07 MED ORDER — DIPHENHYDRAMINE HCL 25 MG PO CAPS
25.0000 mg | ORAL_CAPSULE | Freq: Once | ORAL | Status: AC
  Administered 2013-02-07: 25 mg via ORAL

## 2013-02-07 MED ORDER — SODIUM CHLORIDE 0.9 % IJ SOLN
10.0000 mL | INTRAMUSCULAR | Status: DC | PRN
  Filled 2013-02-07: qty 10

## 2013-02-07 MED ORDER — DIPHENHYDRAMINE HCL 25 MG PO CAPS
ORAL_CAPSULE | ORAL | Status: AC
  Filled 2013-02-07: qty 1

## 2013-02-07 MED ORDER — ACETAMINOPHEN 325 MG PO TABS
ORAL_TABLET | ORAL | Status: AC
  Filled 2013-02-07: qty 2

## 2013-02-07 MED ORDER — SODIUM CHLORIDE 0.9 % IV SOLN
250.0000 mL | Freq: Once | INTRAVENOUS | Status: AC
  Administered 2013-02-07: 250 mL via INTRAVENOUS

## 2013-02-07 NOTE — Patient Instructions (Addendum)
Blood Transfusion Information WHAT IS A BLOOD TRANSFUSION? A transfusion is the replacement of blood or some of its parts. Blood is made up of multiple cells which provide different functions.  Red blood cells carry oxygen and are used for blood loss replacement.  White blood cells fight against infection.  Platelets control bleeding.  Plasma helps clot blood.  Other blood products are available for specialized needs, such as hemophilia or other clotting disorders. BEFORE THE TRANSFUSION  Who gives blood for transfusions?   You may be able to donate blood to be used at a later date on yourself (autologous donation).  Relatives can be asked to donate blood. This is generally not any safer than if you have received blood from a stranger. The same precautions are taken to ensure safety when a relative's blood is donated.  Healthy volunteers who are fully evaluated to make sure their blood is safe. This is blood bank blood. Transfusion therapy is the safest it has ever been in the practice of medicine. Before blood is taken from a donor, a complete history is taken to make sure that person has no history of diseases nor engages in risky social behavior (examples are intravenous drug use or sexual activity with multiple partners). The donor's travel history is screened to minimize risk of transmitting infections, such as malaria. The donated blood is tested for signs of infectious diseases, such as HIV and hepatitis. The blood is then tested to be sure it is compatible with you in order to minimize the chance of a transfusion reaction. If you or a relative donates blood, this is often done in anticipation of surgery and is not appropriate for emergency situations. It takes many days to process the donated blood. RISKS AND COMPLICATIONS Although transfusion therapy is very safe and saves many lives, the main dangers of transfusion include:   Getting an infectious disease.  Developing a  transfusion reaction. This is an allergic reaction to something in the blood you were given. Every precaution is taken to prevent this. The decision to have a blood transfusion has been considered carefully by your caregiver before blood is given. Blood is not given unless the benefits outweigh the risks. AFTER THE TRANSFUSION  Right after receiving a blood transfusion, you will usually feel much better and more energetic. This is especially true if your red blood cells have gotten low (anemic). The transfusion raises the level of the red blood cells which carry oxygen, and this usually causes an energy increase.  The nurse administering the transfusion will monitor you carefully for complications. HOME CARE INSTRUCTIONS  No special instructions are needed after a transfusion. You may find your energy is better. Speak with your caregiver about any limitations on activity for underlying diseases you may have. SEEK MEDICAL CARE IF:   Your condition is not improving after your transfusion.  You develop redness or irritation at the intravenous (IV) site. SEEK IMMEDIATE MEDICAL CARE IF:  Any of the following symptoms occur over the next 12 hours:  Shaking chills.  You have a temperature by mouth above 102 F (38.9 C), not controlled by medicine.  Chest, back, or muscle pain.  People around you feel you are not acting correctly or are confused.  Shortness of breath or difficulty breathing.  Dizziness and fainting.  You get a rash or develop hives.  You have a decrease in urine output.  Your urine turns a dark color or changes to pink, red, or brown. Any of the following   symptoms occur over the next 10 days:  You have a temperature by mouth above 102 F (38.9 C), not controlled by medicine.  Shortness of breath.  Weakness after normal activity.  The white part of the eye turns yellow (jaundice).  You have a decrease in the amount of urine or are urinating less often.  Your  urine turns a dark color or changes to pink, red, or brown. Document Released: 04/25/2000 Document Revised: 07/21/2011 Document Reviewed: 12/13/2007 ExitCare Patient Information 2014 [employer].  

## 2013-02-07 NOTE — Progress Notes (Signed)
Har done 

## 2013-02-08 LAB — TYPE AND SCREEN
ABO/RH(D): A POS
Unit division: 0

## 2013-02-08 LAB — HOLD TUBE, BLOOD BANK

## 2013-02-10 ENCOUNTER — Ambulatory Visit (HOSPITAL_COMMUNITY)
Admission: RE | Admit: 2013-02-10 | Discharge: 2013-02-10 | Disposition: A | Discharge status: Home / Self Care | Payer: Medicare Other | Source: Ambulatory Visit | Attending: Physician Assistant | Admitting: Physician Assistant

## 2013-02-10 ENCOUNTER — Ambulatory Visit (HOSPITAL_COMMUNITY): Admission: RE | Admit: 2013-02-10 | Payer: Medicare Other | Source: Ambulatory Visit

## 2013-02-10 ENCOUNTER — Other Ambulatory Visit: Payer: Self-pay | Admitting: Radiation Therapy

## 2013-02-10 ENCOUNTER — Telehealth: Payer: Self-pay

## 2013-02-10 DIAGNOSIS — K4020 Bilateral inguinal hernia, without obstruction or gangrene, not specified as recurrent: Secondary | ICD-10-CM | POA: Insufficient documentation

## 2013-02-10 DIAGNOSIS — I2510 Atherosclerotic heart disease of native coronary artery without angina pectoris: Secondary | ICD-10-CM | POA: Insufficient documentation

## 2013-02-10 DIAGNOSIS — J90 Pleural effusion, not elsewhere classified: Secondary | ICD-10-CM | POA: Insufficient documentation

## 2013-02-10 DIAGNOSIS — J438 Other emphysema: Secondary | ICD-10-CM | POA: Insufficient documentation

## 2013-02-10 DIAGNOSIS — N200 Calculus of kidney: Secondary | ICD-10-CM | POA: Insufficient documentation

## 2013-02-10 DIAGNOSIS — N281 Cyst of kidney, acquired: Secondary | ICD-10-CM | POA: Insufficient documentation

## 2013-02-10 DIAGNOSIS — C3410 Malignant neoplasm of upper lobe, unspecified bronchus or lung: Secondary | ICD-10-CM | POA: Insufficient documentation

## 2013-02-10 DIAGNOSIS — C7931 Secondary malignant neoplasm of brain: Secondary | ICD-10-CM

## 2013-02-10 DIAGNOSIS — I700 Atherosclerosis of aorta: Secondary | ICD-10-CM | POA: Insufficient documentation

## 2013-02-10 MED ORDER — IOHEXOL 300 MG/ML  SOLN
100.0000 mL | Freq: Once | INTRAMUSCULAR | Status: AC | PRN
  Administered 2013-02-10: 100 mL via INTRAVENOUS

## 2013-02-10 NOTE — Telephone Encounter (Signed)
Luis Payne came to the cancer center today inquiring if he needed to take his decadron premed for the Alimta scheduled for Monday 02-14-13 as he just had a scan and will be discussing the results on10-6-14 with Dr. Arbutus Ped and determine treatment plan.  Told Mr. Derrell Lolling that Dr. Arbutus Ped said to take the decadron premed as scheduled.  It can be stopped on Monday if he does not receive the Alimta.  Patient and wife verbalized understanding.

## 2013-02-14 ENCOUNTER — Other Ambulatory Visit (HOSPITAL_BASED_OUTPATIENT_CLINIC_OR_DEPARTMENT_OTHER): Payer: Medicare Other

## 2013-02-14 ENCOUNTER — Telehealth: Payer: Self-pay | Admitting: Internal Medicine

## 2013-02-14 ENCOUNTER — Other Ambulatory Visit (HOSPITAL_COMMUNITY): Payer: Medicare Other

## 2013-02-14 ENCOUNTER — Encounter: Payer: Self-pay | Admitting: Oncology

## 2013-02-14 ENCOUNTER — Ambulatory Visit (HOSPITAL_BASED_OUTPATIENT_CLINIC_OR_DEPARTMENT_OTHER): Payer: Medicare Other | Admitting: Internal Medicine

## 2013-02-14 ENCOUNTER — Ambulatory Visit: Payer: Medicare Other

## 2013-02-14 ENCOUNTER — Other Ambulatory Visit: Payer: Medicare Other | Admitting: Lab

## 2013-02-14 ENCOUNTER — Encounter: Payer: Self-pay | Admitting: Internal Medicine

## 2013-02-14 DIAGNOSIS — C7931 Secondary malignant neoplasm of brain: Secondary | ICD-10-CM

## 2013-02-14 DIAGNOSIS — D6481 Anemia due to antineoplastic chemotherapy: Secondary | ICD-10-CM

## 2013-02-14 DIAGNOSIS — R5381 Other malaise: Secondary | ICD-10-CM

## 2013-02-14 DIAGNOSIS — C3410 Malignant neoplasm of upper lobe, unspecified bronchus or lung: Secondary | ICD-10-CM

## 2013-02-14 DIAGNOSIS — R0602 Shortness of breath: Secondary | ICD-10-CM

## 2013-02-14 LAB — CBC WITH DIFFERENTIAL/PLATELET
BASO%: 0.4 % (ref 0.0–2.0)
EOS%: 0.8 % (ref 0.0–7.0)
Eosinophils Absolute: 0 10*3/uL (ref 0.0–0.5)
HCT: 30.3 % — ABNORMAL LOW (ref 38.4–49.9)
HGB: 10.5 g/dL — ABNORMAL LOW (ref 13.0–17.1)
LYMPH%: 7.4 % — ABNORMAL LOW (ref 14.0–49.0)
MCH: 30.3 pg (ref 27.2–33.4)
MCHC: 34.7 g/dL (ref 32.0–36.0)
MCV: 87.6 fL (ref 79.3–98.0)
NEUT#: 4.1 10*3/uL (ref 1.5–6.5)
NEUT%: 78.8 % — ABNORMAL HIGH (ref 39.0–75.0)
RDW: 17.6 % — ABNORMAL HIGH (ref 11.0–14.6)
lymph#: 0.4 10*3/uL — ABNORMAL LOW (ref 0.9–3.3)

## 2013-02-14 LAB — COMPREHENSIVE METABOLIC PANEL (CC13)
ALT: 19 U/L (ref 0–55)
Albumin: 3.3 g/dL — ABNORMAL LOW (ref 3.5–5.0)
Alkaline Phosphatase: 122 U/L (ref 40–150)
BUN: 22.8 mg/dL (ref 7.0–26.0)
Creatinine: 1 mg/dL (ref 0.7–1.3)
Glucose: 117 mg/dl (ref 70–140)
Sodium: 140 mEq/L (ref 136–145)
Total Bilirubin: 0.41 mg/dL (ref 0.20–1.20)
Total Protein: 8.3 g/dL (ref 6.4–8.3)

## 2013-02-14 MED ORDER — ERLOTINIB HCL 150 MG PO TABS: 150.0000 mg | ORAL_TABLET | Freq: Every day | ORAL | Status: DC

## 2013-02-14 NOTE — Progress Notes (Signed)
[facility] [phone]   [phone]  OFFICE PROGRESS NOTE  Cassell Smiles., MD [address] [place] [address]  DIAGNOSIS: Metastatic non-small cell lung cancer, adenocarcinoma with negative EGFR mutation and negative ALK gene translocation diagnosed in April 2014   PRIOR THERAPY:  1. Status post stereotactic radiotherapy to 2 brain lesions under the care of Dr. Kathrynn Running.  2. Status post palliative radiotherapy to the left lung mass.  3. Systemic chemotherapy with carboplatin for AUC of 5 and Alimta 500 mg/M2 every 3 weeks, status post 6 cycles.  CURRENT THERAPY: Tarceva 150 mg by mouth daily.    CHEMOTHERAPY INTENT: Palliative  CURRENT # OF CHEMOTHERAPY CYCLES: 1  CURRENT ANTIEMETICS: Compazine CURRENT SMOKING STATUS: Former smoker, quit 09/28/2012  ORAL CHEMOTHERAPY AND CONSENT: Tarceva and the patient signed consent today.  CURRENT BISPHOSPHONATES USE: none  PAIN MANAGEMENT: Percocet  NARCOTICS INDUCED CONSTIPATION: none  LIVING WILL AND CODE STATUS: No code Blue.  INTERVAL HISTORY: Luis Payne 72 y.o. male returns to the clinic today for followup visit accompanied by his wife. The patient is feeling fine today except for the baseline shortness of breath and he is currently on home oxygen. He tolerated the last cycle of his systemic chemotherapy fairly well except for fatigue from the chemotherapy-induced anemia. He received 2 units of PRBCs transfusion I made him feel better. The patient denied having any nausea or vomiting. He denied having any chest pain, cough or hemoptysis. He has no significant weight loss or night sweats. The patient has repeat CT scan of the chest, abdomen and pelvis performed recently and he is here for evaluation and discussion of his scan results.  MEDICAL HISTORY: Past Medical History  Diagnosis Date  . Substance abuse     TOBACCO  . Lung mass   . Lung cancer 08/17/12    LUL  bx=adenocarcinoma  . Brain metastases 08/18/12    mri-  . HOH (hard of hearing)     wears hearing aids  . COPD (chronic obstructive pulmonary disease)   . Systolic heart failure     EF of 30 to 35% per echo in June 2014    ALLERGIES:  has No Known Allergies.  MEDICATIONS:  Current Outpatient Prescriptions  Medication Sig Dispense Refill  . Ascorbic Acid (VITAMIN C) 1000 MG tablet Take 1,000 mg by mouth daily.      . carvedilol (COREG) 3.125 MG tablet Take 1 tablet (3.125 mg total) by mouth 2 (two) times daily.  180 tablet  3  . cholecalciferol (VITAMIN D) 1000 UNITS tablet Take 1,000 Units by mouth every morning.      Marland Kitchen dexamethasone (DECADRON) 4 MG tablet Take 4 mg by mouth 2 (two) times daily with a meal. Day before, day of and day after chemo.      . digoxin (LANOXIN) 0.125 MG tablet Take 1 tablet (0.125 mg total) by mouth daily.  90 tablet  3  . feeding supplement (ENSURE COMPLETE) LIQD Take 237 mLs by mouth 2 (two) times daily between meals.  30 Bottle  5  . folic acid (FOLVITE) 1 MG tablet TAKE 1 TABLET (1 MG TOTAL) BY MOUTH DAILY.  90 tablet  1  . furosemide (LASIX) 20 MG tablet Take 1 tablet (20 mg total) by mouth daily as needed.  30 tablet  3  . guaiFENesin (MUCINEX) 600 MG 12 hr tablet Take 600 mg by mouth daily.      Marland Kitchen  levalbuterol (XOPENEX) 0.63 MG/3ML nebulizer solution Take 3 mLs (0.63 mg total) by nebulization every 4 (four) hours as needed for wheezing.  3 mL  120  . lisinopril (PRINIVIL,ZESTRIL) 2.5 MG tablet Take 1 tablet (2.5 mg total) by mouth daily.  90 tablet  3  . Multiple Vitamin (MULTIVITAMIN WITH MINERALS) TABS Take 1 tablet by mouth every morning.      Marland Kitchen oxyCODONE-acetaminophen (PERCOCET/ROXICET) 5-325 MG per tablet Take 1 tablet by mouth every 6 (six) hours as needed for pain.  90 tablet  0  . OXYGEN-HELIUM IN Inhale 3 L into the lungs daily.      Marland Kitchen PRESCRIPTION MEDICATION Inject into the vein every 21 ( twenty-one) days. Alimta and Paraplatin infusion q21d.        No current facility-administered medications for this visit.    SURGICAL HISTORY:  Past Surgical History  Procedure Laterality Date  . Wrist surgery    . Lung biopsy Left 08/17/12    LUL lung mass-adenocarcinoma    REVIEW OF SYSTEMS:  Constitutional: positive for fatigue Eyes: negative Ears, nose, mouth, throat, and face: negative Respiratory: positive for dyspnea on exertion Cardiovascular: negative Gastrointestinal: negative Genitourinary:negative Integument/breast: negative Hematologic/lymphatic: negative Musculoskeletal:negative Neurological: negative Behavioral/Psych: negative Endocrine: negative Allergic/Immunologic: negative   PHYSICAL EXAMINATION: General appearance: alert, cooperative, fatigued and no distress Head: Normocephalic, without obvious abnormality, atraumatic Neck: no adenopathy, no JVD, supple, symmetrical, trachea midline and thyroid not enlarged, symmetric, no tenderness/mass/nodules Lymph nodes: Cervical, supraclavicular, and axillary nodes normal. Resp: wheezes bilaterally Back: symmetric, no curvature. ROM normal. No CVA tenderness. Cardio: regular rate and rhythm, S1, S2 normal, no murmur, click, rub or gallop GI: soft, non-tender; bowel sounds normal; no masses,  no organomegaly Extremities: extremities normal, atraumatic, no cyanosis or edema Neurologic: Alert and oriented X 3, normal strength and tone. Normal symmetric reflexes. Normal coordination and gait  ECOG PERFORMANCE STATUS: 1 - Symptomatic but completely ambulatory  Blood pressure 126/90, pulse 109, temperature 97.6 F (36.4 C), temperature source Oral, resp. rate 18, height 5\' 6"  (1.676 m), weight 125 lb 11.2 oz (57.017 kg), SpO2 96.00%.  LABORATORY DATA: Lab Results  Component Value Date   WBC 5.2 02/14/2013   HGB 10.5* 02/14/2013   HCT 30.3* 02/14/2013   MCV 87.6 02/14/2013   PLT 91* 02/14/2013      Chemistry      Component Value Date/Time   NA 138 02/07/2013 0943   NA 137  10/22/2012 0448   K 5.1 02/07/2013 0943   K 4.2 10/22/2012 0448   CL 94* 11/01/2012 1105   CL 98 10/22/2012 0448   CO2 28 02/07/2013 0943   CO2 32 10/22/2012 0448   BUN 23.6 02/07/2013 0943   BUN 26* 10/22/2012 0448   CREATININE 1.0 02/07/2013 0943   CREATININE 0.80 10/22/2012 0448      Component Value Date/Time   CALCIUM 10.2 02/07/2013 0943   CALCIUM 9.3 10/22/2012 0448   ALKPHOS 130 02/07/2013 0943   ALKPHOS 128* 10/18/2012 0500   AST 25 02/07/2013 0943   AST 40* 10/18/2012 0500   ALT 21 02/07/2013 0943   ALT 34 10/18/2012 0500   BILITOT 0.75 02/07/2013 0943   BILITOT 0.9 10/18/2012 0500       RADIOGRAPHIC STUDIES: Ct Chest W Contrast  02/10/2013   CLINICAL DATA:  Left upper lobe non-small-cell lung cancer, restaging  EXAM: CT CHEST, ABDOMEN, AND PELVIS WITH CONTRAST  TECHNIQUE: Multidetector CT imaging of the chest, abdomen and pelvis was performed following the standard  protocol during bolus administration of intravenous contrast.  CONTRAST:  OMNIPAQUE IOHEXOL 300 MG/ML  SOLN  COMPARISON:  07/10/2012  FINDINGS:  CT CHEST FINDINGS  Left upper lobe mass measures 5.1 x 4.3 cm when measured in a similar fashion to the prior study (series 5/ image 9), previously 4.3 x 3.9 cm.  Left upper lobe/paramediastinal radiation changes. Small left pleural effusion, increased.  Underlying severe centrilobular and paraseptal emphysematous changes. No pneumothorax.  Visualized thyroid is unremarkable.  The heart is top-normal in size. No pericardial effusion. Coronary atherosclerosis. Mild atherosclerotic calcifications of the aortic arch.  8 mm short axis precarinal node (series 2/ image 20), unchanged. No suspicious hilar or axillary lymphadenopathy.  Degenerative changes of the thoracic spine.   CT ABDOMEN AND PELVIS FINDINGS  Liver, spleen, and pancreas are within normal limits.  Mild thickening of the bilateral adrenal glands without discrete nodule/mass.  Gallbladder is unremarkable. No intrahepatic or  extrahepatic ductal dilatation.  Bilateral nonobstructing renal calculi measuring up to 6 mm on the left and 8 mm on the right. Tiny right renal cysts measuring up to 6 mm in the right lower pole. No hydronephrosis.  No evidence of bowel obstruction. Normal appendix.  Atherosclerotic calcifications of the abdominal aorta and branch vessels.  No abdominopelvic ascites.  No suspicious abdominopelvic lymphadenopathy.  Prostate is unremarkable.  Bladder is within normal limits.  Small bilateral inguinal hernias containing tiny loops of small bowel (series 2/images 101 and 103).  Degenerative changes of the lumbar spine.  IMPRESSION: CT CHEST IMPRESSION  5.1 cm left upper lobe mass, corresponding to known primary bronchogenic carcinoma, mildly increased.  Small left pleural effusion, increased.  Left upper lobe/paramediastinal radiation changes.  CT ABDOMEN AND PELVIS IMPRESSION  No evidence of metastatic disease in the abdomen/pelvis.  Bilateral nonobstructing renal calculi. No hydronephrosis.   Electronically Signed   By: Charline Bills M.D.   On: 02/10/2013 14:19   ASSESSMENT AND PLAN: This is a very pleasant 72 years old white male with metastatic non-small cell lung cancer, adenocarcinoma status post stereotactic radiotherapy to 2 brain lesions in addition to palliative radiotherapy to the left lung mass followed by 6 cycles of systemic chemotherapy with carboplatin and Alimta completed recently but unfortunately the patient has evidence for disease progression in the left upper lobe lung mass. I discussed the scan results with the patient and his wife. I recommended for him to consider treatment with Tarceva 150 mg by mouth daily.  I discussed with the patient adverse effect of this treatment including but not limited to skin rash, diarrhea and possibility of interstitial lung disease. He would like to proceed with treatment as planned and he would signed consent today.  The patient would come back for  followup visit in 2 weeks for reevaluation with repeat CBC and comprehensive metabolic panel for close monitoring of his disease and treatment effect. He was advised to call immediately if he has any concerning symptoms in the interval.  The patient voices understanding of current disease status and treatment options and is in agreement with the current care plan.  All questions were answered. The patient knows to call the clinic with any problems, questions or concerns. We can certainly see the patient much sooner if necessary.  I spent 15 minutes counseling the patient face to face. The total time spent in the appointment was 25 minutes.

## 2013-02-14 NOTE — Progress Notes (Signed)
02/14/13 - BMS [id number] - Questionnaires - Patient into [facility] this morning to see Dr. Arbutus Ped.  The patient was given his questionnaires to complete upon arrival before any study procedures were performed. He completed the PRO's independently the EQ-5D-3L first then the LCSS next.  I checked the PRO's for completeness.  I thanked the patient for his continued support of this clinical trial.

## 2013-02-14 NOTE — Telephone Encounter (Signed)
gv and printed appt sched and avs for pt for OCT. °

## 2013-02-14 NOTE — Patient Instructions (Signed)
CURRENT THERAPY: Tarceva 150 mg by mouth daily.  CHEMOTHERAPY INTENT: Palliative  CURRENT # OF CHEMOTHERAPY CYCLES: 1  CURRENT ANTIEMETICS: Compazine  CURRENT SMOKING STATUS: Former smoker, quit 09/28/2012  ORAL CHEMOTHERAPY AND CONSENT: Tarceva and the patient signed consent today.  CURRENT BISPHOSPHONATES USE: none  PAIN MANAGEMENT: Percocet  NARCOTICS INDUCED CONSTIPATION: none  LIVING WILL AND CODE STATUS: No code Blue.

## 2013-02-15 ENCOUNTER — Encounter: Payer: Self-pay | Admitting: *Deleted

## 2013-02-15 ENCOUNTER — Encounter: Payer: Self-pay | Admitting: Internal Medicine

## 2013-02-15 NOTE — Progress Notes (Signed)
RECEIVED A FAX FROM [facility] CONCERNING A PRIOR AUTHORIZATION FOR TARCEVA. THIS REQUEST WAS PLACED IN THE MANAGED CARE BIN.

## 2013-02-15 NOTE — Progress Notes (Signed)
Faxed tarceva to [facility].

## 2013-02-18 ENCOUNTER — Encounter: Payer: Self-pay | Admitting: Internal Medicine

## 2013-02-18 NOTE — Progress Notes (Signed)
[facility] approved tarceva from 02/15/13-02/16/15 [id number]

## 2013-02-21 ENCOUNTER — Other Ambulatory Visit: Payer: Medicare Other

## 2013-02-24 ENCOUNTER — Telehealth: Payer: Self-pay | Admitting: *Deleted

## 2013-02-24 NOTE — Telephone Encounter (Signed)
Received call from pt's wife that pt will begin Tarceva on 02/25/13.

## 2013-02-24 NOTE — Telephone Encounter (Signed)
ALSO PT.'S WIFE WANTS TO KNOW IF PT. NEEDS TO KEEP HIS 02/28/13 APPOINTMENT WITH ADRENA JOHNSON,PA AND WITH THE LAB. THIS NOTE TO DR.MOHAMED'S NURSE, STEPHANIE JOHNSON,RN.

## 2013-02-28 ENCOUNTER — Ambulatory Visit: Payer: Medicare Other | Admitting: Physician Assistant

## 2013-02-28 ENCOUNTER — Other Ambulatory Visit: Payer: Medicare Other | Admitting: Lab

## 2013-03-10 ENCOUNTER — Other Ambulatory Visit (HOSPITAL_BASED_OUTPATIENT_CLINIC_OR_DEPARTMENT_OTHER): Payer: Medicare Other | Admitting: Lab

## 2013-03-10 ENCOUNTER — Ambulatory Visit (HOSPITAL_BASED_OUTPATIENT_CLINIC_OR_DEPARTMENT_OTHER): Payer: Medicare Other | Admitting: Physician Assistant

## 2013-03-10 ENCOUNTER — Encounter: Payer: Self-pay | Admitting: *Deleted

## 2013-03-10 ENCOUNTER — Other Ambulatory Visit: Payer: Self-pay | Admitting: *Deleted

## 2013-03-10 ENCOUNTER — Telehealth: Payer: Self-pay | Admitting: Internal Medicine

## 2013-03-10 DIAGNOSIS — R21 Rash and other nonspecific skin eruption: Secondary | ICD-10-CM

## 2013-03-10 DIAGNOSIS — R0602 Shortness of breath: Secondary | ICD-10-CM

## 2013-03-10 DIAGNOSIS — C3410 Malignant neoplasm of upper lobe, unspecified bronchus or lung: Secondary | ICD-10-CM

## 2013-03-10 DIAGNOSIS — M549 Dorsalgia, unspecified: Secondary | ICD-10-CM

## 2013-03-10 DIAGNOSIS — C7931 Secondary malignant neoplasm of brain: Secondary | ICD-10-CM

## 2013-03-10 LAB — COMPREHENSIVE METABOLIC PANEL (CC13)
Alkaline Phosphatase: 119 U/L (ref 40–150)
Anion Gap: 9 mEq/L (ref 3–11)
BUN: 19.7 mg/dL (ref 7.0–26.0)
CO2: 29 mEq/L (ref 22–29)
Glucose: 112 mg/dl (ref 70–140)
Sodium: 141 mEq/L (ref 136–145)
Total Bilirubin: 1.05 mg/dL (ref 0.20–1.20)

## 2013-03-10 LAB — CBC WITH DIFFERENTIAL/PLATELET
Basophils Absolute: 0 10*3/uL (ref 0.0–0.1)
EOS%: 2 % (ref 0.0–7.0)
Eosinophils Absolute: 0.1 10*3/uL (ref 0.0–0.5)
HCT: 31.1 % — ABNORMAL LOW (ref 38.4–49.9)
HGB: 10.6 g/dL — ABNORMAL LOW (ref 13.0–17.1)
LYMPH%: 7.2 % — ABNORMAL LOW (ref 14.0–49.0)
MCV: 96.6 fL (ref 79.3–98.0)
MONO%: 12.5 % (ref 0.0–14.0)
NEUT#: 4.1 10*3/uL (ref 1.5–6.5)
NEUT%: 78 % — ABNORMAL HIGH (ref 39.0–75.0)
Platelets: 221 10*3/uL (ref 140–400)
RBC: 3.22 10*6/uL — ABNORMAL LOW (ref 4.20–5.82)

## 2013-03-10 NOTE — Telephone Encounter (Signed)
gv and printed appt sched and avs for pt for NOV °

## 2013-03-10 NOTE — Progress Notes (Signed)
03/10/2013 Patient in to clinic today for routine visit. Upon arrival to clinic, patient completed the Patient Reported Outcomes (PROs) independently, including the EQ-5D-3L followed by the Lung Cancer Symptom Scale (LCSS), prior to the MD appointment. Following completion, patient identifiers were added to the EQ-5D-3L questionnaire. Medication records for study medications of special interest were confirmed by patient and wife and updated appropriately. Patient is aware that the next research blood draw sample is expected to occur at the next follow-up visit, as no samples were collected today. Thanked patient for his participation. Cindy S. Clelia Croft BSN, RN, CCRP 03/10/2013 11:27 AM

## 2013-03-10 NOTE — Patient Instructions (Signed)
Continue taking Tarceva 150 mg by mouth daily Follow up in 2 weeks

## 2013-03-14 NOTE — Progress Notes (Addendum)
[facility] [phone]   Fax:(336) [phone]  SHARED VISIT PROGRESS NOTE  Cassell Smiles., MD [address] [place] [address]  DIAGNOSIS: Metastatic non-small cell lung cancer, adenocarcinoma with negative EGFR mutation and negative ALK gene translocation diagnosed in April 2014   PRIOR THERAPY:  1. Status post stereotactic radiotherapy to 2 brain lesions under the care of Dr. Kathrynn Running.  2. Status post palliative radiotherapy to the left lung mass.  3. Systemic chemotherapy with carboplatin for AUC of 5 and Alimta 500 mg/M2 every 3 weeks, status post 6 cycles.  CURRENT THERAPY: Tarceva 150 mg by mouth daily. Started 02/25/2013   CHEMOTHERAPY INTENT: Palliative  CURRENT # OF CHEMOTHERAPY CYCLES: 1  CURRENT ANTIEMETICS: Compazine CURRENT SMOKING STATUS: Former smoker, quit 09/28/2012  ORAL CHEMOTHERAPY AND CONSENT: Tarceva and the patient signed consent today.  CURRENT BISPHOSPHONATES USE: none  PAIN MANAGEMENT: Percocet  NARCOTICS INDUCED CONSTIPATION: none  LIVING WILL AND CODE STATUS: No code Blue.  INTERVAL HISTORY: Luis Payne 72 y.o. male returns to the clinic today for followup visit accompanied by his wife. The patient is feeling fine today except for the baseline shortness of breath and he is currently on home oxygen. He started Tarceva on 02/25/2013. Thus far is tolerating this medication without difficulty. He has not had any issues with diarrhea as yet. He has a mild rash on his nose and cheeks. He complains of his nose running and he is obtaining no relief with Claritin or Zyrtec or Benadryl. He was having some back pain for about 2 weeks however it resolved. The patient denied having any nausea or vomiting. He denied having any chest pain, cough or hemoptysis. He has no significant weight loss or night sweats.   MEDICAL HISTORY: Past Medical History  Diagnosis Date  . Substance abuse     TOBACCO  . Lung mass    . 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 Brain metastases 08/18/12    mri-  . HOH (hard of hearing)     wears hearing aids  . COPD (chronic obstructive pulmonary disease)   . Systolic heart failure     EF of 30 to 35% per echo in June 2014    ALLERGIES:  has No Known Allergies.  MEDICATIONS:  Current Outpatient Prescriptions  Medication Sig Dispense Refill  . Ascorbic Acid (VITAMIN C) 1000 MG tablet Take 1,000 mg by mouth daily.      . carvedilol (COREG) 3.125 MG tablet Take 1 tablet (3.125 mg total) by mouth 2 (two) times daily.  180 tablet  3  . cholecalciferol (VITAMIN D) 1000 UNITS tablet Take 1,000 Units by mouth every morning.      . digoxin (LANOXIN) 0.125 MG tablet Take 1 tablet (0.125 mg total) by mouth daily.  90 tablet  3  . erlotinib (TARCEVA) 150 MG tablet Take 1 tablet (150 mg total) by mouth daily. Take on an empty stomach 1 hour before meals or 2 hours after.  30 tablet  2  . feeding supplement (ENSURE COMPLETE) LIQD Take 237 mLs by mouth 2 (two) times daily between meals.  30 Bottle  5  . folic acid (FOLVITE) 1 MG tablet TAKE 1 TABLET (1 MG TOTAL) BY MOUTH DAILY.  90 tablet  1  . furosemide (LASIX) 20 MG tablet Take 1 tablet (20 mg total) by mouth daily as needed.  30 tablet  3  . guaiFENesin (MUCINEX) 600 MG  12 hr tablet Take 600 mg by mouth daily.      Marland Kitchen levalbuterol (XOPENEX) 0.63 MG/3ML nebulizer solution Take 3 mLs (0.63 mg total) by nebulization every 4 (four) hours as needed for wheezing.  3 mL  120  . lisinopril (PRINIVIL,ZESTRIL) 2.5 MG tablet Take 1 tablet (2.5 mg total) by mouth daily.  90 tablet  3  . Multiple Vitamin (MULTIVITAMIN WITH MINERALS) TABS Take 1 tablet by mouth every morning.      Marland Kitchen oxyCODONE-acetaminophen (PERCOCET/ROXICET) 5-325 MG per tablet Take 1 tablet by mouth every 6 (six) hours as needed for pain.  90 tablet  0  . OXYGEN-HELIUM IN Inhale 3 L into the lungs daily.       No current facility-administered medications for this  visit.    SURGICAL HISTORY:  Past Surgical History  Procedure Laterality Date  . Wrist surgery    . Lung biopsy Left 08/17/12    LUL lung mass-adenocarcinoma    REVIEW OF SYSTEMS:  Constitutional: positive for fatigue Eyes: negative Ears, nose, mouth, throat, and face: negative Respiratory: positive for dyspnea on exertion Cardiovascular: negative Gastrointestinal: negative Genitourinary:negative Integument/breast: negative Hematologic/lymphatic: negative Musculoskeletal:negative Neurological: negative Behavioral/Psych: negative Endocrine: negative Allergic/Immunologic: negative   PHYSICAL EXAMINATION: General appearance: alert, cooperative, fatigued and no distress Head: Normocephalic, without obvious abnormality, atraumatic Neck: no adenopathy, no JVD, supple, symmetrical, trachea midline and thyroid not enlarged, symmetric, no tenderness/mass/nodules Lymph nodes: Cervical, supraclavicular, and axillary nodes normal. Resp: wheezes bilaterally Back: symmetric, no curvature. ROM normal. No CVA tenderness. Cardio: regular rate and rhythm, S1, S2 normal, no murmur, click, rub or gallop GI: soft, non-tender; bowel sounds normal; no masses,  no organomegaly Extremities: extremities normal, atraumatic, no cyanosis or edema Neurologic: Alert and oriented X 3, normal strength and tone. Normal symmetric reflexes. Normal coordination and gait Skin: Mild acneform eruptions over the nose and cheeks, no evidence of superinfection.  ECOG PERFORMANCE STATUS: 1 - Symptomatic but completely ambulatory  Blood pressure 100/55, pulse 91, temperature 97.5 F (36.4 C), temperature source Oral, resp. rate 19, height 5\' 6"  (1.676 m), weight 125 lb 3.2 oz (56.79 kg).  LABORATORY DATA: Lab Results  Component Value Date   WBC 5.2 03/10/2013   HGB 10.6* 03/10/2013   HCT 31.1* 03/10/2013   MCV 96.6 03/10/2013   PLT 221 03/10/2013      Chemistry      Component Value Date/Time   NA 141  03/10/2013 1009   NA 137 10/22/2012 0448   K 4.2 03/10/2013 1009   K 4.2 10/22/2012 0448   CL 94* 11/01/2012 1105   CL 98 10/22/2012 0448   CO2 29 03/10/2013 1009   CO2 32 10/22/2012 0448   BUN 19.7 03/10/2013 1009   BUN 26* 10/22/2012 0448   CREATININE 1.1 03/10/2013 1009   CREATININE 0.80 10/22/2012 0448      Component Value Date/Time   CALCIUM 10.3 03/10/2013 1009   CALCIUM 9.3 10/22/2012 0448   ALKPHOS 119 03/10/2013 1009   ALKPHOS 128* 10/18/2012 0500   AST 27 03/10/2013 1009   AST 40* 10/18/2012 0500   ALT 18 03/10/2013 1009   ALT 34 10/18/2012 0500   BILITOT 1.05 03/10/2013 1009   BILITOT 0.9 10/18/2012 0500       RADIOGRAPHIC STUDIES: Ct Chest W Contrast  02/10/2013   CLINICAL DATA:  Left upper lobe non-small-cell lung cancer, restaging  EXAM: CT CHEST, ABDOMEN, AND PELVIS WITH CONTRAST  TECHNIQUE: Multidetector CT imaging of the chest, abdomen and pelvis was  performed following the standard protocol during bolus administration of intravenous contrast.  CONTRAST:  OMNIPAQUE IOHEXOL 300 MG/ML  SOLN  COMPARISON:  07/10/2012  FINDINGS:  CT CHEST FINDINGS  Left upper lobe mass measures 5.1 x 4.3 cm when measured in a similar fashion to the prior study (series 5/ image 9), previously 4.3 x 3.9 cm.  Left upper lobe/paramediastinal radiation changes. Small left pleural effusion, increased.  Underlying severe centrilobular and paraseptal emphysematous changes. No pneumothorax.  Visualized thyroid is unremarkable.  The heart is top-normal in size. No pericardial effusion. Coronary atherosclerosis. Mild atherosclerotic calcifications of the aortic arch.  8 mm short axis precarinal node (series 2/ image 20), unchanged. No suspicious hilar or axillary lymphadenopathy.  Degenerative changes of the thoracic spine.   CT ABDOMEN AND PELVIS FINDINGS  Liver, spleen, and pancreas are within normal limits.  Mild thickening of the bilateral adrenal glands without discrete nodule/mass.  Gallbladder is  unremarkable. No intrahepatic or extrahepatic ductal dilatation.  Bilateral nonobstructing renal calculi measuring up to 6 mm on the left and 8 mm on the right. Tiny right renal cysts measuring up to 6 mm in the right lower pole. No hydronephrosis.  No evidence of bowel obstruction. Normal appendix.  Atherosclerotic calcifications of the abdominal aorta and branch vessels.  No abdominopelvic ascites.  No suspicious abdominopelvic lymphadenopathy.  Prostate is unremarkable.  Bladder is within normal limits.  Small bilateral inguinal hernias containing tiny loops of small bowel (series 2/images 101 and 103).  Degenerative changes of the lumbar spine.  IMPRESSION: CT CHEST IMPRESSION  5.1 cm left upper lobe mass, corresponding to known primary bronchogenic carcinoma, mildly increased.  Small left pleural effusion, increased.  Left upper lobe/paramediastinal radiation changes.  CT ABDOMEN AND PELVIS IMPRESSION  No evidence of metastatic disease in the abdomen/pelvis.  Bilateral nonobstructing renal calculi. No hydronephrosis.   Electronically Signed   By: Charline Bills M.D.   On: 02/10/2013 14:19   ASSESSMENT AND PLAN: This is a very pleasant 72 years old white male with metastatic non-small cell lung cancer, adenocarcinoma status post stereotactic radiotherapy to 2 brain lesions in addition to palliative radiotherapy to the left lung mass followed by 6 cycles of systemic chemotherapy with carboplatin and Alimta completed recently but unfortunately the patient has evidence for disease progression in the left upper lobe lung mass. Patient is currently being treated with Tarceva at 150 mg by mouth daily, therapy beginning 02/25/2013. Patient was discussed with and also seen by Dr. Arbutus Ped. He'll continue on Tarceva 150 mg by mouth daily. He'll return in 2 weeks for another symptom management visit with a repeat CBC differential and C. met.   Laural Benes, Shantee Hayne E, PA-C   He was advised to call immediately if he  has any concerning symptoms in the interval.  The patient voices understanding of current disease status and treatment options and is in agreement with the current care plan.  All questions were answered. The patient knows to call the clinic with any problems, questions or concerns. We can certainly see the patient much sooner if necessary.  ADDENDUM: Hematology/Oncology Attending: I had a face to face encounter with the patient. I recommended his care plan. The patient has metastatic non-small cell lung cancer, adenocarcinoma status post systemic chemotherapy with carboplatin and Alimta but unfortunately has evidence for disease progression after cycle #6. He was started on treatment with single agent oral Tarceva status post 2 weeks of treatment and he is tolerating it fairly well. The patient denied  having any significant weight loss or night sweats but he has grade 1 skin rash mainly on the nose and face but no diarrhea. I recommended for him to continue his current treatment with Tarceva. The patient would come back for follow up visit in 2 weeks for reevaluation and management any adverse effect of his treatment. He was advised to call immediately if he has any concerning symptoms in the interval. Lajuana Matte., MD 03/14/2013

## 2013-03-17 ENCOUNTER — Other Ambulatory Visit: Payer: Self-pay

## 2013-03-22 ENCOUNTER — Ambulatory Visit
Admission: RE | Admit: 2013-03-22 | Discharge: 2013-03-22 | Disposition: A | Discharge status: Home / Self Care | Payer: Medicare Other | Source: Ambulatory Visit | Attending: Radiation Oncology | Admitting: Radiation Oncology

## 2013-03-22 DIAGNOSIS — C7931 Secondary malignant neoplasm of brain: Secondary | ICD-10-CM

## 2013-03-22 MED ORDER — GADOBENATE DIMEGLUMINE 529 MG/ML IV SOLN
11.0000 mL | Freq: Once | INTRAVENOUS | Status: AC | PRN
  Administered 2013-03-22: 11 mL via INTRAVENOUS

## 2013-03-24 ENCOUNTER — Other Ambulatory Visit (HOSPITAL_BASED_OUTPATIENT_CLINIC_OR_DEPARTMENT_OTHER): Payer: Medicare Other | Admitting: Lab

## 2013-03-24 ENCOUNTER — Encounter: Payer: Self-pay | Admitting: Internal Medicine

## 2013-03-24 ENCOUNTER — Ambulatory Visit (HOSPITAL_BASED_OUTPATIENT_CLINIC_OR_DEPARTMENT_OTHER): Payer: Medicare Other | Admitting: Physician Assistant

## 2013-03-24 ENCOUNTER — Telehealth: Payer: Self-pay | Admitting: Internal Medicine

## 2013-03-24 ENCOUNTER — Encounter: Payer: Self-pay | Admitting: Physician Assistant

## 2013-03-24 VITALS — BP 127/68 | HR 94 | Temp 96.0°F | Resp 18 | Ht 66.0 in | Wt 123.6 lb

## 2013-03-24 DIAGNOSIS — R21 Rash and other nonspecific skin eruption: Secondary | ICD-10-CM

## 2013-03-24 DIAGNOSIS — C3410 Malignant neoplasm of upper lobe, unspecified bronchus or lung: Secondary | ICD-10-CM

## 2013-03-24 DIAGNOSIS — C7931 Secondary malignant neoplasm of brain: Secondary | ICD-10-CM

## 2013-03-24 DIAGNOSIS — R0602 Shortness of breath: Secondary | ICD-10-CM

## 2013-03-24 LAB — CBC WITH DIFFERENTIAL/PLATELET
BASO%: 0.6 % (ref 0.0–2.0)
Eosinophils Absolute: 0.2 10*3/uL (ref 0.0–0.5)
HCT: 32.7 % — ABNORMAL LOW (ref 38.4–49.9)
LYMPH%: 5.4 % — ABNORMAL LOW (ref 14.0–49.0)
MCH: 33.7 pg — ABNORMAL HIGH (ref 27.2–33.4)
MONO#: 0.7 10*3/uL (ref 0.1–0.9)
MONO%: 9.4 % (ref 0.0–14.0)
NEUT#: 6.5 10*3/uL (ref 1.5–6.5)
NEUT%: 82 % — ABNORMAL HIGH (ref 39.0–75.0)
Platelets: 246 10*3/uL (ref 140–400)
RBC: 3.27 10*6/uL — ABNORMAL LOW (ref 4.20–5.82)
WBC: 7.9 10*3/uL (ref 4.0–10.3)
lymph#: 0.4 10*3/uL — ABNORMAL LOW (ref 0.9–3.3)

## 2013-03-24 LAB — COMPREHENSIVE METABOLIC PANEL (CC13)
ALT: 18 U/L (ref 0–55)
AST: 25 U/L (ref 5–34)
Albumin: 3.3 g/dL — ABNORMAL LOW (ref 3.5–5.0)
Alkaline Phosphatase: 132 U/L (ref 40–150)
Anion Gap: 8 meq/L (ref 3–11)
BUN: 21.4 mg/dL (ref 7.0–26.0)
CO2: 29 meq/L (ref 22–29)
Calcium: 10.3 mg/dL (ref 8.4–10.4)
Chloride: 102 meq/L (ref 98–109)
Creatinine: 1 mg/dL (ref 0.7–1.3)
Glucose: 120 mg/dL (ref 70–140)
Potassium: 4.1 meq/L (ref 3.5–5.1)
Sodium: 139 meq/L (ref 136–145)
Total Bilirubin: 0.9 mg/dL (ref 0.20–1.20)
Total Protein: 7.9 g/dL (ref 6.4–8.3)

## 2013-03-24 LAB — RESEARCH LABS

## 2013-03-24 MED ORDER — CLINDAMYCIN PHOSPHATE 1 % EX LOTN: TOPICAL_LOTION | CUTANEOUS | Status: DC

## 2013-03-24 MED ORDER — DOXYCYCLINE HYCLATE 100 MG PO TABS: ORAL_TABLET | ORAL | Status: DC

## 2013-03-24 NOTE — Progress Notes (Addendum)
[facility] Telephone:(336) [phone]   [phone]  SHARED VISIT PROGRESS NOTE  Cassell Smiles., MD [address] [place] [address]  DIAGNOSIS: Metastatic non-small cell lung cancer, adenocarcinoma with negative EGFR mutation and negative ALK gene translocation diagnosed in April 2014   PRIOR THERAPY:  1. Status post stereotactic radiotherapy to 2 brain lesions under the care of Dr. Kathrynn Running.  2. Status post palliative radiotherapy to the left lung mass.  3. Systemic chemotherapy with carboplatin for AUC of 5 and Alimta 500 mg/M2 every 3 weeks, status post 6 cycles.  CURRENT THERAPY: Tarceva 150 mg by mouth daily. Started 02/25/2013. Status post approximately one month of therapy   CHEMOTHERAPY INTENT: Palliative  CURRENT # OF CHEMOTHERAPY CYCLES: 1  CURRENT ANTIEMETICS: Compazine CURRENT SMOKING STATUS: Former smoker, quit 09/28/2012  ORAL CHEMOTHERAPY AND CONSENT: Tarceva and the patient signed consent today.  CURRENT BISPHOSPHONATES USE: none  PAIN MANAGEMENT: Percocet  NARCOTICS INDUCED CONSTIPATION: none  LIVING WILL AND CODE STATUS: No code Blue.  INTERVAL HISTORY: Luis Payne 72 y.o. male returns to the clinic today for followup visit accompanied by his wife. The patient is feeling fine today except for the baseline shortness of breath and he is currently on home oxygen. He started Tarceva on 02/25/2013 and is status post approximately one month of therapy. Thus far is tolerating this medication without significant difficulty. He reports 2 episodes of diarrhea. He also complains of his eyes watering a lot. Her sometimes sticking together. He also has a skin rash affecting his nose and cheeks and to a lesser degree his chin. His biggest complaints are that of his shortness of breath and his use the oxygen. He is wondering if it is still related to his bowel with Legionella. He is not currently followed by any of the  pulmonologist. He also has a history of COPD.  The patient denied having any nausea or vomiting. He denied having any chest pain, cough or hemoptysis. He has no significant weight loss or night sweats.   MEDICAL HISTORY: Past Medical History  Diagnosis Date  . Substance abuse     TOBACCO  . Lung mass   . 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 Brain metastases 08/18/12    mri-  . HOH (hard of hearing)     wears hearing aids  . COPD (chronic obstructive pulmonary disease)   . Systolic heart failure     EF of 30 to 35% per echo in June 2014    ALLERGIES:  has No Known Allergies.  MEDICATIONS:  Current Outpatient Prescriptions  Medication Sig Dispense Refill  . Ascorbic Acid (VITAMIN C) 1000 MG tablet Take 1,000 mg by mouth daily.      . carvedilol (COREG) 3.125 MG tablet Take 1 tablet (3.125 mg total) by mouth 2 (two) times daily.  180 tablet  3  . cholecalciferol (VITAMIN D) 1000 UNITS tablet Take 1,000 Units by mouth every morning.      . digoxin (LANOXIN) 0.125 MG tablet Take 1 tablet (0.125 mg total) by mouth daily.  90 tablet  3  . erlotinib (TARCEVA) 150 MG tablet Take 1 tablet (150 mg total) by mouth daily. Take on an empty stomach 1 hour before meals or 2 hours after.  30 tablet  2  . feeding supplement (ENSURE COMPLETE) LIQD Take 237 mLs by mouth 2 (two) times daily between meals.  30 Bottle  5  .  folic acid (FOLVITE) 1 MG tablet TAKE 1 TABLET (1 MG TOTAL) BY MOUTH DAILY.  90 tablet  1  . furosemide (LASIX) 20 MG tablet Take 1 tablet (20 mg total) by mouth daily as needed.  30 tablet  3  . guaiFENesin (MUCINEX) 600 MG 12 hr tablet Take 600 mg by mouth daily.      Marland Kitchen levalbuterol (XOPENEX) 0.63 MG/3ML nebulizer solution Take 3 mLs (0.63 mg total) by nebulization every 4 (four) hours as needed for wheezing.  3 mL  120  . lisinopril (PRINIVIL,ZESTRIL) 2.5 MG tablet Take 1 tablet (2.5 mg total) by mouth daily.  90 tablet  3  . Multiple Vitamin (MULTIVITAMIN WITH MINERALS)  TABS Take 1 tablet by mouth every morning.      . OXYGEN-HELIUM IN Inhale 3 L into the lungs daily.      . clindamycin (CLEOCIN T) 1 % lotion Apply twice daily to affected area.  60 mL  1  . doxycycline (VIBRA-TABS) 100 MG tablet Take 1 tab (100mg ) by mouth 2 times daily for 10 days.  20 tablet  0  . oxyCODONE-acetaminophen (PERCOCET/ROXICET) 5-325 MG per tablet Take 1 tablet by mouth every 6 (six) hours as needed for pain.  90 tablet  0   No current facility-administered medications for this visit.    SURGICAL HISTORY:  Past Surgical History  Procedure Laterality Date  . Wrist surgery    . Lung biopsy Left 08/17/12    LUL lung mass-adenocarcinoma    REVIEW OF SYSTEMS:  Constitutional: positive for fatigue Eyes: positive for irritation Ears, nose, mouth, throat, and face: negative Respiratory: positive for dyspnea on exertion and On supplemental oxygen, 2 L nasal cannula Cardiovascular: negative Gastrointestinal: negative Genitourinary:negative Integument/breast: positive for rash Hematologic/lymphatic: negative Musculoskeletal:negative Neurological: negative Behavioral/Psych: negative Endocrine: negative Allergic/Immunologic: negative   PHYSICAL EXAMINATION: General appearance: alert, cooperative, fatigued and no distress Head: Normocephalic, without obvious abnormality, atraumatic Neck: no adenopathy, no JVD, supple, symmetrical, trachea midline and thyroid not enlarged, symmetric, no tenderness/mass/nodules Lymph nodes: Cervical, supraclavicular, and axillary nodes normal. Resp: wheezes bilaterally Back: symmetric, no curvature. ROM normal. No CVA tenderness. Cardio: regular rate and rhythm, S1, S2 normal, no murmur, click, rub or gallop GI: soft, non-tender; bowel sounds normal; no masses,  no organomegaly Extremities: extremities normal, atraumatic, no cyanosis or edema Neurologic: Alert and oriented X 3, normal strength and tone. Normal symmetric reflexes. Normal  coordination and gait Skin: Mild to moderate acneform eruptions over the nose, chin and cheeks, no evidence of superinfection. Eyes: There is some erythema about the eyelashes with some mild injection of the conjunctiva, no drainage present  ECOG PERFORMANCE STATUS: 1 - Symptomatic but completely ambulatory  Blood pressure 127/68, pulse 94, temperature 96 F (35.6 C), temperature source Oral, resp. rate 18, height 5\' 6"  (1.676 m), weight 123 lb 9.6 oz (56.065 kg).  LABORATORY DATA: Lab Results  Component Value Date   WBC 7.9 03/24/2013   HGB 11.0* 03/24/2013   HCT 32.7* 03/24/2013   MCV 100.1* 03/24/2013   PLT 246 03/24/2013      Chemistry      Component Value Date/Time   NA 139 03/24/2013 1001   NA 137 10/22/2012 0448   K 4.1 03/24/2013 1001   K 4.2 10/22/2012 0448   CL 94* 11/01/2012 1105   CL 98 10/22/2012 0448   CO2 29 03/24/2013 1001   CO2 32 10/22/2012 0448   BUN 21.4 03/24/2013 1001   BUN 26* 10/22/2012 0448  CREATININE 1.0 03/24/2013 1001   CREATININE 0.80 10/22/2012 0448      Component Value Date/Time   CALCIUM 10.3 03/24/2013 1001   CALCIUM 9.3 10/22/2012 0448   ALKPHOS 132 03/24/2013 1001   ALKPHOS 128* 10/18/2012 0500   AST 25 03/24/2013 1001   AST 40* 10/18/2012 0500   ALT 18 03/24/2013 1001   ALT 34 10/18/2012 0500   BILITOT 0.90 03/24/2013 1001   BILITOT 0.9 10/18/2012 0500       RADIOGRAPHIC STUDIES: Ct Chest W Contrast  02/10/2013   CLINICAL DATA:  Left upper lobe non-small-cell lung cancer, restaging  EXAM: CT CHEST, ABDOMEN, AND PELVIS WITH CONTRAST  TECHNIQUE: Multidetector CT imaging of the chest, abdomen and pelvis was performed following the standard protocol during bolus administration of intravenous contrast.  CONTRAST:  OMNIPAQUE IOHEXOL 300 MG/ML  SOLN  COMPARISON:  07/10/2012  FINDINGS:  CT CHEST FINDINGS  Left upper lobe mass measures 5.1 x 4.3 cm when measured in a similar fashion to the prior study (series 5/ image 9), previously 4.3 x 3.9  cm.  Left upper lobe/paramediastinal radiation changes. Small left pleural effusion, increased.  Underlying severe centrilobular and paraseptal emphysematous changes. No pneumothorax.  Visualized thyroid is unremarkable.  The heart is top-normal in size. No pericardial effusion. Coronary atherosclerosis. Mild atherosclerotic calcifications of the aortic arch.  8 mm short axis precarinal node (series 2/ image 20), unchanged. No suspicious hilar or axillary lymphadenopathy.  Degenerative changes of the thoracic spine.   CT ABDOMEN AND PELVIS FINDINGS  Liver, spleen, and pancreas are within normal limits.  Mild thickening of the bilateral adrenal glands without discrete nodule/mass.  Gallbladder is unremarkable. No intrahepatic or extrahepatic ductal dilatation.  Bilateral nonobstructing renal calculi measuring up to 6 mm on the left and 8 mm on the right. Tiny right renal cysts measuring up to 6 mm in the right lower pole. No hydronephrosis.  No evidence of bowel obstruction. Normal appendix.  Atherosclerotic calcifications of the abdominal aorta and branch vessels.  No abdominopelvic ascites.  No suspicious abdominopelvic lymphadenopathy.  Prostate is unremarkable.  Bladder is within normal limits.  Small bilateral inguinal hernias containing tiny loops of small bowel (series 2/images 101 and 103).  Degenerative changes of the lumbar spine.  IMPRESSION: CT CHEST IMPRESSION  5.1 cm left upper lobe mass, corresponding to known primary bronchogenic carcinoma, mildly increased.  Small left pleural effusion, increased.  Left upper lobe/paramediastinal radiation changes.  CT ABDOMEN AND PELVIS IMPRESSION  No evidence of metastatic disease in the abdomen/pelvis.  Bilateral nonobstructing renal calculi. No hydronephrosis.   Electronically Signed   By: Charline Bills M.D.   On: 02/10/2013 14:19   ASSESSMENT AND PLAN: This is a very pleasant 72 years old white male with metastatic non-small cell lung cancer,  adenocarcinoma status post stereotactic radiotherapy to 2 brain lesions in addition to palliative radiotherapy to the left lung mass followed by 6 cycles of systemic chemotherapy with carboplatin and Alimta completed recently but unfortunately the patient has evidence for disease progression in the left upper lobe lung mass. Patient is currently being treated with Tarceva at 150 mg by mouth daily, therapy beginning 02/25/2013, status post approximately one month of therapy. Patient was discussed with and also seen by Dr. Arbutus Ped. He'll continue on Tarceva 150 mg by mouth daily. He'll followup in one month for another symptom management visit with a restaging CT of the chest, abdomen and pelvis with contrast to reevaluate his disease. For his grade 1-2  skin rash and eye irritation you'll be placed on a 10 day course of doxycycline 100 mg by mouth twice daily. Additionally for a skin rash a prescription for clindamycin lotion to be applied twice daily was sent to his pharmacy of record in addition to the doxycycline a prescription via E. scribed. We'll also refer him to [facility] pulmonology for further evaluation of his complaints of shortness of breath and oxygen dependence.  Laural Benes, Wali Reinheimer E, PA-C   He was advised to call immediately if he has any concerning symptoms in the interval.  The patient voices understanding of current disease status and treatment options and is in agreement with the current care plan.  All questions were answered. The patient knows to call the clinic with any problems, questions or concerns. We can certainly see the patient much sooner if necessary.  ADDENDUM: Hematology/Oncology Attending: I had the face-to-face encounter with the patient. I recommended his plan. This is a very pleasant 72 years old white male with history of metastatic non-small cell lung cancer, adenocarcinoma currently undergoing treatment with oral Tarceva 150 mg by mouth daily status post 1 months of  treatment and tolerating it fairly well except for the grade 1-2 skin rash mainly on the face and nose. He denied having any significant diarrhea. I recommended for the patient to continue his treatment with Tarceva. We'll give the patient prescription for doxycycline 100 mg by mouth twice a day for 10 days in addition to clindamycin 1% lotion.  For the COPD, we will refer the patient to a pulmonologist for evaluation of his condition. Lajuana Matte., MD 03/26/2013

## 2013-03-24 NOTE — Progress Notes (Signed)
The pt was in the Cancer Center this am for his lab and md appts. I met with the pt and gave him the questionnaires (PRO's) upon arrival at the cancer center. The pt completed the PRO's ( first the EQ-[id number] then the LCSS booklet) before any study procedures were performed. I, the Arts development officer, then reviewed and checked the PRO's for completeness. The pt's research blood will be drawn today. The pt was thanked for his willingness to meet with me and for her continued participation in the clinical trial.

## 2013-03-24 NOTE — Telephone Encounter (Signed)
appts made per 11/13 POF contrast given pt adv Ct will call w appt for imaging AVS and cal given shh

## 2013-03-25 NOTE — Patient Instructions (Signed)
Continue taking Tarceva 150 mg by mouth daily For your skin rash and I irritation take the doxycycline as prescribed Used for clindamycin lotion for your skin rash also as prescribed Keep the appointment with a pulmonologist for evaluation of your complaints of shortness of breath and oxygen dependent Followup with Dr. Arbutus Ped in one month with a restaging CT scan of your chest, abdomen and pelvis to reevaluate your disease

## 2013-04-18 ENCOUNTER — Ambulatory Visit (HOSPITAL_COMMUNITY)
Admission: RE | Admit: 2013-04-18 | Discharge: 2013-04-18 | Disposition: A | Discharge status: Home / Self Care | Payer: Medicare Other | Source: Ambulatory Visit | Attending: Physician Assistant | Admitting: Physician Assistant

## 2013-04-18 DIAGNOSIS — J90 Pleural effusion, not elsewhere classified: Secondary | ICD-10-CM | POA: Insufficient documentation

## 2013-04-18 DIAGNOSIS — K4020 Bilateral inguinal hernia, without obstruction or gangrene, not specified as recurrent: Secondary | ICD-10-CM | POA: Insufficient documentation

## 2013-04-18 DIAGNOSIS — N200 Calculus of kidney: Secondary | ICD-10-CM | POA: Insufficient documentation

## 2013-04-18 DIAGNOSIS — C3490 Malignant neoplasm of unspecified part of unspecified bronchus or lung: Secondary | ICD-10-CM | POA: Insufficient documentation

## 2013-04-18 DIAGNOSIS — C7931 Secondary malignant neoplasm of brain: Secondary | ICD-10-CM

## 2013-04-18 DIAGNOSIS — J438 Other emphysema: Secondary | ICD-10-CM | POA: Insufficient documentation

## 2013-04-18 MED ORDER — IOHEXOL 300 MG/ML  SOLN
100.0000 mL | Freq: Once | INTRAMUSCULAR | Status: AC | PRN
  Administered 2013-04-18: 100 mL via INTRAVENOUS

## 2013-04-21 ENCOUNTER — Ambulatory Visit (HOSPITAL_COMMUNITY)
Admission: RE | Admit: 2013-04-21 | Discharge: 2013-04-21 | Disposition: A | Discharge status: Home / Self Care | Payer: Medicare Other | Source: Ambulatory Visit | Attending: Radiology | Admitting: Radiology

## 2013-04-21 ENCOUNTER — Telehealth: Payer: Self-pay | Admitting: *Deleted

## 2013-04-21 ENCOUNTER — Ambulatory Visit (HOSPITAL_COMMUNITY)
Admission: RE | Admit: 2013-04-21 | Discharge: 2013-04-21 | Disposition: A | Discharge status: Home / Self Care | Payer: Medicare Other | Source: Ambulatory Visit | Attending: Internal Medicine | Admitting: Internal Medicine

## 2013-04-21 ENCOUNTER — Encounter: Payer: Self-pay | Admitting: Internal Medicine

## 2013-04-21 ENCOUNTER — Telehealth: Payer: Self-pay | Admitting: Internal Medicine

## 2013-04-21 ENCOUNTER — Other Ambulatory Visit (HOSPITAL_BASED_OUTPATIENT_CLINIC_OR_DEPARTMENT_OTHER): Payer: Medicare Other

## 2013-04-21 ENCOUNTER — Ambulatory Visit (HOSPITAL_BASED_OUTPATIENT_CLINIC_OR_DEPARTMENT_OTHER): Payer: Medicare Other | Admitting: Internal Medicine

## 2013-04-21 VITALS — BP 111/64 | HR 86 | Temp 97.0°F | Resp 16 | Ht 66.0 in | Wt 125.5 lb

## 2013-04-21 DIAGNOSIS — J90 Pleural effusion, not elsewhere classified: Secondary | ICD-10-CM | POA: Insufficient documentation

## 2013-04-21 DIAGNOSIS — C3492 Malignant neoplasm of unspecified part of left bronchus or lung: Secondary | ICD-10-CM

## 2013-04-21 DIAGNOSIS — C782 Secondary malignant neoplasm of pleura: Secondary | ICD-10-CM

## 2013-04-21 DIAGNOSIS — C3410 Malignant neoplasm of upper lobe, unspecified bronchus or lung: Secondary | ICD-10-CM

## 2013-04-21 DIAGNOSIS — C3490 Malignant neoplasm of unspecified part of unspecified bronchus or lung: Secondary | ICD-10-CM | POA: Insufficient documentation

## 2013-04-21 LAB — COMPREHENSIVE METABOLIC PANEL (CC13)
ALT: 24 U/L (ref 0–55)
AST: 32 U/L (ref 5–34)
Albumin: 2.7 g/dL — ABNORMAL LOW (ref 3.5–5.0)
Alkaline Phosphatase: 124 U/L (ref 40–150)
Anion Gap: 8 mEq/L (ref 3–11)
BUN: 16 mg/dL (ref 7.0–26.0)
CO2: 29 mEq/L (ref 22–29)
Calcium: 9.6 mg/dL (ref 8.4–10.4)
Chloride: 100 mEq/L (ref 98–109)
Creatinine: 1.1 mg/dL (ref 0.7–1.3)
Potassium: 5 mEq/L (ref 3.5–5.1)
Sodium: 137 mEq/L (ref 136–145)
Total Protein: 7 g/dL (ref 6.4–8.3)

## 2013-04-21 LAB — CBC WITH DIFFERENTIAL/PLATELET
BASO%: 0.5 % (ref 0.0–2.0)
Basophils Absolute: 0 10*3/uL (ref 0.0–0.1)
EOS%: 3.5 % (ref 0.0–7.0)
Eosinophils Absolute: 0.2 10*3/uL (ref 0.0–0.5)
HCT: 34.6 % — ABNORMAL LOW (ref 38.4–49.9)
HGB: 11.6 g/dL — ABNORMAL LOW (ref 13.0–17.1)
MCH: 34.3 pg — ABNORMAL HIGH (ref 27.2–33.4)
MCHC: 33.5 g/dL (ref 32.0–36.0)
MCV: 102.3 fL — ABNORMAL HIGH (ref 79.3–98.0)
MONO%: 11.1 % (ref 0.0–14.0)
NEUT#: 4.9 10*3/uL (ref 1.5–6.5)
Platelets: 234 10*3/uL (ref 140–400)
RBC: 3.38 10*6/uL — ABNORMAL LOW (ref 4.20–5.82)
RDW: 15.1 % — ABNORMAL HIGH (ref 11.0–14.6)
lymph#: 0.5 10*3/uL — ABNORMAL LOW (ref 0.9–3.3)

## 2013-04-21 NOTE — Progress Notes (Signed)
04/21/2013 BMS [id number] Pt came in today for his lab and md visits. I met the pt in the lobby after he had his labs drawn and gave him the questionnaires to complete. He first completed the EQ-5D-3L then the LCSS booklet without any assistance. I then checked the PRO's for completeness and thanked the pt for his continued participation in the clinical trial. He will not have research labs drawn today.

## 2013-04-21 NOTE — Telephone Encounter (Signed)
Per staff message and POF I have scheduled appts.  JMW  

## 2013-04-21 NOTE — Procedures (Signed)
Successful US guided left thoracentesis. Yielded 1.2L of bloody pleural fluid. Pt tolerated procedure well. No immediate complications.  Specimen was not sent for labs. CXR ordered.  Brayton El PA-C 04/21/2013 1:50 PM

## 2013-04-21 NOTE — Telephone Encounter (Signed)
Gave pt appt for lab and ML for December 2014, emailed Marcelino Duster regarding chemo, sent pt to thoracenthesis today

## 2013-04-21 NOTE — Progress Notes (Signed)
[facility] Telephone:([phone]) [phone]   Fax:([phone]  SHARED VISIT PROGRESS NOTE  Cassell Smiles., MD [address] [place] [address]  DIAGNOSIS: Metastatic non-small cell lung cancer, adenocarcinoma with negative EGFR mutation and negative ALK gene translocation diagnosed in April 2014   PRIOR THERAPY:  1. Status post stereotactic radiotherapy to 2 brain lesions under the care of Dr. Kathrynn Running.  2. Status post palliative radiotherapy to the left lung mass.  3. Systemic chemotherapy with carboplatin for AUC of 5 and Alimta 500 mg/M2 every 3 weeks, status post 6 cycles. 4. Tarceva 150 mg by mouth daily. Started 02/25/2013. Status post approximately two month of therapy discontinued today secondary to disease progression.  CURRENT THERAPY: Systemic chemotherapy with gemcitabine 1000 mg/M2 on days 1 and 8 every 3 weeks. First dose on 04/26/2013   CHEMOTHERAPY INTENT: Palliative  CURRENT # OF CHEMOTHERAPY CYCLES: 1  CURRENT ANTIEMETICS: Compazine CURRENT SMOKING STATUS: Former smoker, quit 09/28/2012  ORAL CHEMOTHERAPY AND CONSENT: Tarceva and the patient signed consent today.  CURRENT BISPHOSPHONATES USE: none  PAIN MANAGEMENT: Percocet  NARCOTICS INDUCED CONSTIPATION: none  LIVING WILL AND CODE STATUS: No code Blue.  INTERVAL HISTORY: Luis Payne 72 y.o. male returns to the clinic today for followup visit accompanied by his wife. The patient continues to complain of increasing dyspnea and mild pain on the left side of his chest. He denied having any significant hemoptysis. He is tolerating his treatment with Tarceva fairly well with no significant adverse effects except for mild skin rash. He denied having any significant nausea or vomiting. He has no fever or chills. The patient denied having any significant weight loss or night sweats. He had repeat CT scan of the chest, abdomen and pelvis performed recently and he is here for  evaluation and discussion of his scan results.  MEDICAL HISTORY: Past Medical History  Diagnosis Date  . Substance abuse     TOBACCO  . Lung mass   . 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 Brain metastases 08/18/12    mri-  . HOH (hard of hearing)     wears hearing aids  . COPD (chronic obstructive pulmonary disease)   . Systolic heart failure     EF of 30 to 35% per echo in June 2014    ALLERGIES:  has No Known Allergies.  MEDICATIONS:  Current Outpatient Prescriptions  Medication Sig Dispense Refill  . Ascorbic Acid (VITAMIN C) 1000 MG tablet Take 1,000 mg by mouth daily.      . carvedilol (COREG) 3.125 MG tablet Take 1 tablet (3.125 mg total) by mouth 2 (two) times daily.  180 tablet  3  . cholecalciferol (VITAMIN D) 1000 UNITS tablet Take 1,000 Units by mouth every morning.      . clindamycin (CLEOCIN T) 1 % lotion Apply twice daily to affected area.  60 mL  1  . digoxin (LANOXIN) 0.125 MG tablet Take 1 tablet (0.125 mg total) by mouth daily.  90 tablet  3  . erlotinib (TARCEVA) 150 MG tablet Take 1 tablet (150 mg total) by mouth daily. Take on an empty stomach 1 hour before meals or 2 hours after.  30 tablet  2  . feeding supplement (ENSURE COMPLETE) LIQD Take 237 mLs by mouth 2 (two) times daily between meals.  30 Bottle  5  . folic acid (FOLVITE) 1 MG tablet TAKE 1 TABLET (1 MG TOTAL) BY MOUTH DAILY.  90 tablet  1  . furosemide (LASIX) 20 MG tablet Take 1 tablet (20 mg total) by mouth daily as needed.  30 tablet  3  . guaiFENesin (MUCINEX) 600 MG 12 hr tablet Take 600 mg by mouth daily.      Marland Kitchen levalbuterol (XOPENEX) 0.63 MG/3ML nebulizer solution Take 3 mLs (0.63 mg total) by nebulization every 4 (four) hours as needed for wheezing.  3 mL  120  . lisinopril (PRINIVIL,ZESTRIL) 2.5 MG tablet Take 1 tablet (2.5 mg total) by mouth daily.  90 tablet  3  . Multiple Vitamin (MULTIVITAMIN WITH MINERALS) TABS Take 1 tablet by mouth every morning.      Marland Kitchen  oxyCODONE-acetaminophen (PERCOCET/ROXICET) 5-325 MG per tablet Take 1 tablet by mouth every 6 (six) hours as needed for pain.  90 tablet  0  . OXYGEN-HELIUM IN Inhale 3 L into the lungs daily.       No current facility-administered medications for this visit.    SURGICAL HISTORY:  Past Surgical History  Procedure Laterality Date  . Wrist surgery    . Lung biopsy Left 08/17/12    LUL lung mass-adenocarcinoma    REVIEW OF SYSTEMS:  Constitutional: positive for fatigue Eyes: positive for irritation Ears, nose, mouth, throat, and face: negative Respiratory: positive for dyspnea on exertion and On supplemental oxygen, 2 L nasal cannula Cardiovascular: negative Gastrointestinal: negative Genitourinary:negative Integument/breast: positive for rash Hematologic/lymphatic: negative Musculoskeletal:negative Neurological: negative Behavioral/Psych: negative Endocrine: negative Allergic/Immunologic: negative   PHYSICAL EXAMINATION: General appearance: alert, cooperative, fatigued and no distress Head: Normocephalic, without obvious abnormality, atraumatic Neck: no adenopathy, no JVD, supple, symmetrical, trachea midline and thyroid not enlarged, symmetric, no tenderness/mass/nodules Lymph nodes: Cervical, supraclavicular, and axillary nodes normal. Resp: wheezes bilaterally Back: symmetric, no curvature. ROM normal. No CVA tenderness. Cardio: regular rate and rhythm, S1, S2 normal, no murmur, click, rub or gallop GI: soft, non-tender; bowel sounds normal; no masses,  no organomegaly Extremities: extremities normal, atraumatic, no cyanosis or edema Neurologic: Alert and oriented X 3, normal strength and tone. Normal symmetric reflexes. Normal coordination and gait Skin: Mild to moderate acneform eruptions over the nose, chin and cheeks, no evidence of superinfection.   ECOG PERFORMANCE STATUS: 2 - Symptomatic, <50% confined to bed  Blood pressure 111/64, pulse 86, temperature 97 F (36.1  C), temperature source Oral, resp. rate 16, height 5\' 6"  (1.676 m), weight 125 lb 8 oz (56.926 kg).  LABORATORY DATA: Lab Results  Component Value Date   WBC 6.4 04/21/2013   HGB 11.6* 04/21/2013   HCT 34.6* 04/21/2013   MCV 102.3* 04/21/2013   PLT 234 04/21/2013      Chemistry      Component Value Date/Time   NA 137 04/21/2013 0944   NA 137 10/22/2012 0448   K 5.0 04/21/2013 0944   K 4.2 10/22/2012 0448   CL 94* 11/01/2012 1105   CL 98 10/22/2012 0448   CO2 29 04/21/2013 0944   CO2 32 10/22/2012 0448   BUN 16.0 04/21/2013 0944   BUN 26* 10/22/2012 0448   CREATININE 1.1 04/21/2013 0944   CREATININE 0.80 10/22/2012 0448      Component Value Date/Time   CALCIUM 9.6 04/21/2013 0944   CALCIUM 9.3 10/22/2012 0448   ALKPHOS 124 04/21/2013 0944   ALKPHOS 128* 10/18/2012 0500   AST 32 04/21/2013 0944   AST 40* 10/18/2012 0500   ALT 24 04/21/2013 0944   ALT 34 10/18/2012 0500   BILITOT 0.60 04/21/2013 0944   BILITOT  0.9 10/18/2012 0500       RADIOGRAPHIC STUDIES: Dg Chest 1 View  04/21/2013   CLINICAL DATA:  Status post left thoracentesis.  Lung cancer.  EXAM: CHEST - 1 VIEW  COMPARISON:  Chest CT 04/18/2013.  Chest radiographs 10/20/2012.  FINDINGS: The cardiac silhouette appears within normal limits for size although the left cardiac contour is partially obscured. When compared to recent chest CT, left-sided pleural effusion has decreased in size and is now moderate in size. There is left lung volume loss with superior retraction of the hilum. Irregular opacity in the left upper lung is similar to prior CT. Diffusely increased interstitial markings are present throughout the remainder of both lungs, with linear opacities present in the right lung base, likely unchanged from prior CT. No right-sided pleural effusion is seen. No pneumothorax is identified. No acute osseous abnormality is seen.  IMPRESSION: 1. Moderate left pleural effusion, decreased from prior. No pneumothorax identified. 2.  Similar appearance of left upper lung masslike opacity compared to recent CT.   Electronically Signed   By: Sebastian Ache   On: 04/21/2013 14:16   Ct Chest W Contrast  04/18/2013   CLINICAL DATA:  Followup metastatic non-small cell lung carcinoma. Restaging.  EXAM: CT CHEST, ABDOMEN, AND PELVIS WITH CONTRAST  TECHNIQUE: Multidetector CT imaging of the chest, abdomen and pelvis was performed following the standard protocol during bolus administration of intravenous contrast.  CONTRAST:  OMNIPAQUE IOHEXOL 300 MG/ML  SOLN  COMPARISON:  02/10/2013  FINDINGS: CT CHEST FINDINGS  Increased size of moderate left pleural effusion is seen with increased in multiple pleural-based nodules and masses in the left hemi thorax, consistent with malignant pleural effusion. No evidence of right pleural effusion or pericardial effusion.  Severe pulmonary emphysema is again demonstrated. Masslike opacity in the left lung apex is mildly decreased in size, currently measuring 3.6 x 5.3 cm on image 9 compared to 4.0 x 5.5 cm previously. Increased compressive atelectasis is seen in the left lung base related to the pleural effusion. A 7 mm pulmonary nodule in the right middle lobe on image 30 shows mild increase in size from 4 mm on previous study. No other new or enlarging pulmonary nodules identified. No suspicious bone lesions identified  CT ABDOMEN AND PELVIS FINDINGS  The liver, gallbladder, adrenal glands, spleen, and pancreas are normal in appearance. Nonobstructive renal calculi are again seen bilaterally, however there is no evidence of hydronephrosis or renal masses. No other soft tissue masses or lymphadenopathy identified within the abdomen or pelvis.  Prior hysterectomy noted. Adnexal regions are unremarkable. No evidence of inflammatory process or abnormal fluid collections within the abdomen or pelvis. Small bilateral inguinal hernias containing small bowel are stable. No evidence of bowel obstruction or ischemia no  suspicious bone lesions identified.  IMPRESSION: Increased malignant left pleural effusion and pleural based metastatic disease.  Increased size of 7 mm pulmonary nodule in the right middle lobe, suspicious for pulmonary metastasis.  Slight decrease in size of masslike opacity in the left lung apex.  No evidence of abdominal or pelvic metastatic disease.  Stable nonobstructive nephrolithiasis and small bilateral inguinal hernias.   Electronically Signed   By: Myles Rosenthal M.D.   On: 04/18/2013 16:50   US Thoracentesis Asp Pleural Space W/img Guide  04/21/2013   CLINICAL DATA:  Lung cancer, left-sided pleural effusion. Request therapeutic thoracentesis  EXAM: ULTRASOUND GUIDED left THORACENTESIS  COMPARISON:  CT scan of the chest from 04/18/2013.  FINDINGS: A total of  approximately 1.2 L of bloody pleural fluid was removed. A fluid sample was notsent for laboratory analysis.  IMPRESSION: Successful ultrasound guided left thoracentesis yielding 1.2 L of pleural fluid.  Read by: Brayton El PA-C  PROCEDURE: An ultrasound guided thoracentesis was thoroughly discussed with the patient and questions answered. The benefits, risks, alternatives and complications were also discussed. The patient understands and wishes to proceed with the procedure. Written consent was obtained.  Ultrasound was performed to localize and mark an adequate pocket of fluid in the left chest. The area was then prepped and draped in the normal sterile fashion. 1% Lidocaine was used for local anesthesia. Under ultrasound guidance a 19 gauge Yueh catheter was introduced. Thoracentesis was performed. The catheter was removed and a dressing applied.  Complications:  None immediate.   Electronically Signed   By: Oley Balm M.D.   On: 04/21/2013 13:57   ASSESSMENT AND PLAN: This is a very pleasant 72 years old white male with history of metastatic non-small cell lung cancer, adenocarcinoma currently undergoing treatment with oral Tarceva 150  mg by mouth daily status post 2 months of treatment and tolerating it fairly well except for the grade 1-2 skin rash mainly on the face and nose. He denied having any significant diarrhea. Unfortunately the recent CT scan of the chest, abdomen and pelvis showed evidence for disease progression especially in the left pleural based metastasis as well as enlarging left pleural effusion. The patient is currently symptomatic with significant dyspnea. I recommended for the patient to have ultrasound-guided left thoracentesis done today by interventional radiology. I also recommended for the patient to discontinue his treatment was Tarceva at this point because of the disease progression. I discussed with him other treatment options including palliative care versus treatment with single agent gemcitabine 1000 mg/M2 every 3 weeks. I discussed with the patient adverse effects of this treatment including but not limited to alopecia, myelosuppression, nausea and vomiting, peripheral neuropathy, liver or renal dysfunction. I also discussed with the patient treatment with single agent docetaxel but unfortunately his condition and performance status is very poor to tolerate this treatment. He agreed to proceed with the treatment with gemcitabine and first cycle be scheduled on 04/26/2013. He would come back for followup visit in 2 weeks for reevaluation and management any adverse effect of his treatment. He was advised to call immediately if he has any concerning symptoms in the interval. The patient voices understanding of current disease status and treatment options and is in agreement with the current care plan.  All questions were answered. The patient knows to call the clinic with any problems, questions or concerns. We can certainly see the patient much sooner if necessary.  Lajuana Matte., MD 04/21/2013

## 2013-04-22 ENCOUNTER — Telehealth: Payer: Self-pay | Admitting: Internal Medicine

## 2013-04-22 NOTE — Patient Instructions (Signed)
CURRENT THERAPY: Systemic chemotherapy with gemcitabine 1000 mg/M2 on days 1 and 8 every 3 weeks. First dose on 04/26/2013   CHEMOTHERAPY INTENT: Palliative  CURRENT # OF CHEMOTHERAPY CYCLES: 1  CURRENT ANTIEMETICS: Compazine CURRENT SMOKING STATUS: Former smoker, quit 09/28/2012  ORAL CHEMOTHERAPY AND CONSENT: Tarceva and the patient signed consent today.  CURRENT BISPHOSPHONATES USE: none  PAIN MANAGEMENT: Percocet  NARCOTICS INDUCED CONSTIPATION: none  LIVING WILL AND CODE STATUS: No code Blue.

## 2013-04-22 NOTE — Telephone Encounter (Signed)
Talked to pt's wife gave her appt for 12/16 advised pt to get appt calendar

## 2013-04-25 ENCOUNTER — Other Ambulatory Visit: Payer: Self-pay | Admitting: *Deleted

## 2013-04-26 ENCOUNTER — Telehealth: Payer: Self-pay | Admitting: *Deleted

## 2013-04-26 ENCOUNTER — Other Ambulatory Visit: Payer: Self-pay | Admitting: *Deleted

## 2013-04-26 ENCOUNTER — Encounter: Payer: Self-pay | Admitting: *Deleted

## 2013-04-26 ENCOUNTER — Ambulatory Visit (HOSPITAL_BASED_OUTPATIENT_CLINIC_OR_DEPARTMENT_OTHER): Payer: Medicare Other

## 2013-04-26 ENCOUNTER — Other Ambulatory Visit (HOSPITAL_BASED_OUTPATIENT_CLINIC_OR_DEPARTMENT_OTHER): Payer: Medicare Other

## 2013-04-26 DIAGNOSIS — C782 Secondary malignant neoplasm of pleura: Secondary | ICD-10-CM

## 2013-04-26 DIAGNOSIS — Z5111 Encounter for antineoplastic chemotherapy: Secondary | ICD-10-CM

## 2013-04-26 DIAGNOSIS — C3410 Malignant neoplasm of upper lobe, unspecified bronchus or lung: Secondary | ICD-10-CM

## 2013-04-26 LAB — COMPREHENSIVE METABOLIC PANEL (CC13)
AST: 38 U/L — ABNORMAL HIGH (ref 5–34)
Anion Gap: 11 mEq/L (ref 3–11)
BUN: 19.1 mg/dL (ref 7.0–26.0)
CO2: 28 mEq/L (ref 22–29)
Calcium: 10 mg/dL (ref 8.4–10.4)
Chloride: 98 mEq/L (ref 98–109)
Creatinine: 1 mg/dL (ref 0.7–1.3)

## 2013-04-26 LAB — CBC WITH DIFFERENTIAL/PLATELET
Basophils Absolute: 0 10*3/uL (ref 0.0–0.1)
EOS%: 0 % (ref 0.0–7.0)
Eosinophils Absolute: 0 10*3/uL (ref 0.0–0.5)
HCT: 33.9 % — ABNORMAL LOW (ref 38.4–49.9)
HGB: 11.5 g/dL — ABNORMAL LOW (ref 13.0–17.1)
LYMPH%: 6.1 % — ABNORMAL LOW (ref 14.0–49.0)
MCH: 33.6 pg — ABNORMAL HIGH (ref 27.2–33.4)
MCV: 99.1 fL — ABNORMAL HIGH (ref 79.3–98.0)
MONO%: 3 % (ref 0.0–14.0)
NEUT#: 7.7 10*3/uL — ABNORMAL HIGH (ref 1.5–6.5)
Platelets: 189 10*3/uL (ref 140–400)
RDW: 14.4 % (ref 11.0–14.6)
nRBC: 0 % (ref 0–0)

## 2013-04-26 MED ORDER — SODIUM CHLORIDE 0.9 % IV SOLN
Freq: Once | INTRAVENOUS | Status: AC
  Administered 2013-04-26: 10:00:00 via INTRAVENOUS

## 2013-04-26 MED ORDER — PROCHLORPERAZINE MALEATE 10 MG PO TABS
10.0000 mg | ORAL_TABLET | Freq: Once | ORAL | Status: AC
  Administered 2013-04-26: 10 mg via ORAL

## 2013-04-26 MED ORDER — SODIUM CHLORIDE 0.9 % IV SOLN
1000.0000 mg/m2 | Freq: Once | INTRAVENOUS | Status: AC
  Administered 2013-04-26: 1634 mg via INTRAVENOUS
  Filled 2013-04-26: qty 42.98

## 2013-04-26 MED ORDER — PROCHLORPERAZINE MALEATE 10 MG PO TABS: 10.0000 mg | ORAL_TABLET | Freq: Four times a day (QID) | ORAL | Status: DC | PRN

## 2013-04-26 MED ORDER — PROCHLORPERAZINE MALEATE 10 MG PO TABS
ORAL_TABLET | ORAL | Status: AC
  Filled 2013-04-26: qty 1

## 2013-04-26 NOTE — Telephone Encounter (Signed)
Pr patient request I have moved 1/6 appts to later in the morning

## 2013-04-26 NOTE — Progress Notes (Signed)
04/26/2013 Patient in to clinic today to begin new treatment regimen, transitioning from oral agent to IV chemotherapy. Explained to patient that research blood samples will be collected today, as he is beginning a new treatment, then will be collected at the start of every other treatment cycle. Patient is scheduled for 21 day cycles with gemcitabine to be administered on Days 1 and 8. Patient will not complete PROs today as they were completed at Thursday's visit on 04/21/2013. Patient is in agreement with this plan. Thanked patient for his participation. Cindy S. Clelia Croft BSN, RN, CCRP 04/26/2013 9:08 AM

## 2013-04-26 NOTE — Patient Instructions (Signed)
[facility] Discharge Instructions for Patients Receiving Chemotherapy  Today you received the following chemotherapy agents Gemzar  To help prevent nausea and vomiting after your treatment, we encourage you to take your nausea medication Compazine   If you develop nausea and vomiting that is not controlled by your nausea medication, call the clinic.   BELOW ARE SYMPTOMS THAT SHOULD BE REPORTED IMMEDIATELY:  *FEVER GREATER THAN 100.5 F  *CHILLS WITH OR WITHOUT FEVER  NAUSEA AND VOMITING THAT IS NOT CONTROLLED WITH YOUR NAUSEA MEDICATION  *UNUSUAL SHORTNESS OF BREATH  *UNUSUAL BRUISING OR BLEEDING  TENDERNESS IN MOUTH AND THROAT WITH OR WITHOUT PRESENCE OF ULCERS  *URINARY PROBLEMS  *BOWEL PROBLEMS  UNUSUAL RASH Items with * indicate a potential emergency and should be followed up as soon as possible.  Feel free to call the clinic you have any questions or concerns. The clinic phone number is [phone].   Gemcitabine injection What is this medicine? GEMCITABINE (jem SIT a been) is a chemotherapy drug. This medicine is used to treat many types of cancer like breast cancer, lung cancer, pancreatic cancer, and ovarian cancer. This medicine may be used for other purposes; ask your health care provider or pharmacist if you have questions. COMMON BRAND NAME(S): Gemzar What should I tell my health care provider before I take this medicine? They need to know if you have any of these conditions: -blood disorders -infection -kidney disease -liver disease -recent or ongoing radiation therapy -an unusual or allergic reaction to gemcitabine, other chemotherapy, other medicines, foods, dyes, or preservatives -pregnant or trying to get pregnant -breast-feeding How should I use this medicine? This drug is given as an infusion into a vein. It is administered in a hospital or clinic by a specially trained health care professional. Talk to your pediatrician regarding  the use of this medicine in children. Special care may be needed. Overdosage: If you think you have taken too much of this medicine contact a poison control center or emergency room at once. NOTE: This medicine is only for you. Do not share this medicine with others. What if I miss a dose? It is important not to miss your dose. Call your doctor or health care professional if you are unable to keep an appointment. What may interact with this medicine? -medicines to increase blood counts like filgrastim, pegfilgrastim, sargramostim -some other chemotherapy drugs like cisplatin -vaccines Talk to your doctor or health care professional before taking any of these medicines: -acetaminophen -aspirin -ibuprofen -ketoprofen -naproxen This list may not describe all possible interactions. Give your health care provider a list of all the medicines, herbs, non-prescription drugs, or dietary supplements you use. Also tell them if you smoke, drink alcohol, or use illegal drugs. Some items may interact with your medicine. What should I watch for while using this medicine? Visit your doctor for checks on your progress. This drug may make you feel generally unwell. This is not uncommon, as chemotherapy can affect healthy cells as well as cancer cells. Report any side effects. Continue your course of treatment even though you feel ill unless your doctor tells you to stop. In some cases, you may be given additional medicines to help with side effects. Follow all directions for their use. Call your doctor or health care professional for advice if you get a fever, chills or sore throat, or other symptoms of a cold or flu. Do not treat yourself. This drug decreases your body's ability to fight infections. Try to avoid  being around people who are sick. This medicine may increase your risk to bruise or bleed. Call your doctor or health care professional if you notice any unusual bleeding. Be careful brushing and  flossing your teeth or using a toothpick because you may get an infection or bleed more easily. If you have any dental work done, tell your dentist you are receiving this medicine. Avoid taking products that contain aspirin, acetaminophen, ibuprofen, naproxen, or ketoprofen unless instructed by your doctor. These medicines may hide a fever. Women should inform their doctor if they wish to become pregnant or think they might be pregnant. There is a potential for serious side effects to an unborn child. Talk to your health care professional or pharmacist for more information. Do not breast-feed an infant while taking this medicine. What side effects may I notice from receiving this medicine? Side effects that you should report to your doctor or health care professional as soon as possible: -allergic reactions like skin rash, itching or hives, swelling of the face, lips, or tongue -low blood counts - this medicine may decrease the number of white blood cells, red blood cells and platelets. You may be at increased risk for infections and bleeding. -signs of infection - fever or chills, cough, sore throat, pain or difficulty passing urine -signs of decreased platelets or bleeding - bruising, pinpoint red spots on the skin, black, tarry stools, blood in the urine -signs of decreased red blood cells - unusually weak or tired, fainting spells, lightheadedness -breathing problems -chest pain -mouth sores -nausea and vomiting -pain, swelling, redness at site where injected -pain, tingling, numbness in the hands or feet -stomach pain -swelling of ankles, feet, hands -unusual bleeding Side effects that usually do not require medical attention (report to your doctor or health care professional if they continue or are bothersome): -constipation -diarrhea -hair loss -loss of appetite -stomach upset This list may not describe all possible side effects. Call your doctor for medical advice about side effects.  You may report side effects to FDA at 1-800-FDA-1088. Where should I keep my medicine? This drug is given in a hospital or clinic and will not be stored at home. NOTE: This sheet is a summary. It may not cover all possible information. If you have questions about this medicine, talk to your doctor, pharmacist, or health care provider.  2014, [employer]/Gold Standard. (2007-09-07 18:45:54)

## 2013-04-27 ENCOUNTER — Telehealth: Payer: Self-pay | Admitting: Nurse Practitioner

## 2013-04-27 ENCOUNTER — Ambulatory Visit (HOSPITAL_COMMUNITY)
Admission: RE | Admit: 2013-04-27 | Discharge: 2013-04-27 | Disposition: A | Discharge status: Home / Self Care | Payer: Medicare Other | Source: Ambulatory Visit | Attending: Physician Assistant | Admitting: Physician Assistant

## 2013-04-27 ENCOUNTER — Other Ambulatory Visit: Payer: Self-pay | Admitting: *Deleted

## 2013-04-27 ENCOUNTER — Telehealth: Payer: Self-pay | Admitting: *Deleted

## 2013-04-27 ENCOUNTER — Other Ambulatory Visit: Payer: Self-pay | Admitting: Physician Assistant

## 2013-04-27 ENCOUNTER — Ambulatory Visit (HOSPITAL_COMMUNITY)
Admission: RE | Admit: 2013-04-27 | Discharge: 2013-04-27 | Disposition: A | Discharge status: Home / Self Care | Payer: Medicare Other | Source: Ambulatory Visit | Attending: Radiology | Admitting: Radiology

## 2013-04-27 VITALS — BP 107/70

## 2013-04-27 DIAGNOSIS — J90 Pleural effusion, not elsewhere classified: Secondary | ICD-10-CM | POA: Insufficient documentation

## 2013-04-27 DIAGNOSIS — C3410 Malignant neoplasm of upper lobe, unspecified bronchus or lung: Secondary | ICD-10-CM | POA: Insufficient documentation

## 2013-04-27 DIAGNOSIS — C7931 Secondary malignant neoplasm of brain: Secondary | ICD-10-CM

## 2013-04-27 DIAGNOSIS — C801 Malignant (primary) neoplasm, unspecified: Secondary | ICD-10-CM | POA: Insufficient documentation

## 2013-04-27 NOTE — Telephone Encounter (Signed)
Message copied by Augusto Garbe on Wed Apr 27, 2013 11:37 AM ------      Message from: Kallie Locks      Created: Tue Apr 26, 2013 11:16 AM      Regarding: "1st time chemotherapy"      Contact: [phone]       Patient received 1st time Gemzar today, per Dr. Arbutus Ped; tolerated treatment well; desk nurse to call anti-nausea medication today; patient is aware. ------

## 2013-04-27 NOTE — Telephone Encounter (Signed)
Patient wife phoned c/o patient having low oxygen levels, last night down in the low 80's and this am 86%. Yesterday O2 sat 95-96%. Per wife patient had thoracentesis last week and IV chemotherapy yesterday. Weight is up 2 pounds since yesterday. Blood pressure 111/62. Discussed with Dawayne Patricia NP and she feels this is not a cardiac issue and to go the ED if unable to get an appointment with oncology. Advised wife and she will try to call again for an appointment. Will go to ED if unable to see.

## 2013-04-27 NOTE — Telephone Encounter (Signed)
New problem     Pt's wife called to report pt's Oxegen level is lowe.

## 2013-04-27 NOTE — Procedures (Signed)
US guided therapeutic left thoracentesis performed yielding 950 cc's dark, bloody fluid. F/u CXR pending. No immediate complications.

## 2013-04-27 NOTE — Telephone Encounter (Signed)
Wife called reporting patient received first Gemzar treatment yesterday and was low saturations last night at 83 to 84%.  Today saturations = 86%.  Report he weighs 2 lbs more today than on yesterday.  Asked if this is from the chemotherapy.  Informed her Gemzar will not cause these symptoms..  Instructed to call cardiologist and I will notify Dr. Arbutus Ped.  Reports Kiyan has fluid around his lung from the oral Tarceva pill, had to have fluid removed from his lung and cardiologist will not know about this.  Home number is [phone].

## 2013-04-27 NOTE — Telephone Encounter (Signed)
Called Luis Payne for chemotherapy F/U and spoke with his wife, [name].  [name] had called originally today about low oxygen sats after this first treatment and thoracentesis has been ordered.  With return call noted patient otherwise is doing well.  Denies n/v.  Denies any new side effects or symptoms.  Bowel and bladder is functioning well.  Eating and drinking well and I instructed to drink 64 oz minimum daily or at least the day before, of and after treatment.  No fluid restrictions with other co-morbidities.  Denies questions at this time and encouraged to call if needed.  Reviewed how to call after hours in the case of an emergency.

## 2013-04-29 ENCOUNTER — Emergency Department (HOSPITAL_COMMUNITY): Payer: Medicare Other

## 2013-04-29 ENCOUNTER — Encounter (HOSPITAL_COMMUNITY): Payer: Self-pay | Admitting: Emergency Medicine

## 2013-04-29 ENCOUNTER — Telehealth: Payer: Self-pay | Admitting: *Deleted

## 2013-04-29 ENCOUNTER — Inpatient Hospital Stay (HOSPITAL_COMMUNITY)
Admission: EM | Admit: 2013-04-29 | Discharge: 2013-05-07 | DRG: 871 | Disposition: A | Discharge status: Home / Self Care | Payer: Medicare Other | Attending: Internal Medicine | Admitting: Internal Medicine

## 2013-04-29 DIAGNOSIS — J189 Pneumonia, unspecified organism: Secondary | ICD-10-CM

## 2013-04-29 DIAGNOSIS — A4159 Other Gram-negative sepsis: Principal | ICD-10-CM | POA: Diagnosis present

## 2013-04-29 DIAGNOSIS — J9819 Other pulmonary collapse: Secondary | ICD-10-CM | POA: Diagnosis present

## 2013-04-29 DIAGNOSIS — Z9221 Personal history of antineoplastic chemotherapy: Secondary | ICD-10-CM

## 2013-04-29 DIAGNOSIS — Z79899 Other long term (current) drug therapy: Secondary | ICD-10-CM

## 2013-04-29 DIAGNOSIS — Z66 Do not resuscitate: Secondary | ICD-10-CM | POA: Diagnosis present

## 2013-04-29 DIAGNOSIS — D638 Anemia in other chronic diseases classified elsewhere: Secondary | ICD-10-CM | POA: Diagnosis present

## 2013-04-29 DIAGNOSIS — A419 Sepsis, unspecified organism: Secondary | ICD-10-CM | POA: Diagnosis present

## 2013-04-29 DIAGNOSIS — H9190 Unspecified hearing loss, unspecified ear: Secondary | ICD-10-CM | POA: Diagnosis present

## 2013-04-29 DIAGNOSIS — I509 Heart failure, unspecified: Secondary | ICD-10-CM | POA: Diagnosis present

## 2013-04-29 DIAGNOSIS — J150 Pneumonia due to Klebsiella pneumoniae: Secondary | ICD-10-CM | POA: Diagnosis present

## 2013-04-29 DIAGNOSIS — J9620 Acute and chronic respiratory failure, unspecified whether with hypoxia or hypercapnia: Secondary | ICD-10-CM | POA: Diagnosis present

## 2013-04-29 DIAGNOSIS — Z87891 Personal history of nicotine dependence: Secondary | ICD-10-CM

## 2013-04-29 DIAGNOSIS — I2699 Other pulmonary embolism without acute cor pulmonale: Secondary | ICD-10-CM | POA: Diagnosis present

## 2013-04-29 DIAGNOSIS — C801 Malignant (primary) neoplasm, unspecified: Secondary | ICD-10-CM

## 2013-04-29 DIAGNOSIS — D696 Thrombocytopenia, unspecified: Secondary | ICD-10-CM

## 2013-04-29 DIAGNOSIS — C3490 Malignant neoplasm of unspecified part of unspecified bronchus or lung: Secondary | ICD-10-CM

## 2013-04-29 DIAGNOSIS — C799 Secondary malignant neoplasm of unspecified site: Secondary | ICD-10-CM

## 2013-04-29 DIAGNOSIS — Z923 Personal history of irradiation: Secondary | ICD-10-CM

## 2013-04-29 DIAGNOSIS — J910 Malignant pleural effusion: Secondary | ICD-10-CM | POA: Diagnosis present

## 2013-04-29 DIAGNOSIS — R079 Chest pain, unspecified: Secondary | ICD-10-CM

## 2013-04-29 DIAGNOSIS — C7931 Secondary malignant neoplasm of brain: Secondary | ICD-10-CM | POA: Diagnosis present

## 2013-04-29 DIAGNOSIS — I5022 Chronic systolic (congestive) heart failure: Secondary | ICD-10-CM | POA: Diagnosis present

## 2013-04-29 HISTORY — DX: Legionnaires' disease: A48.1

## 2013-04-29 HISTORY — DX: Heart failure, unspecified: I50.9

## 2013-04-29 LAB — CBC WITH DIFFERENTIAL/PLATELET
Basophils Absolute: 0 10*3/uL (ref 0.0–0.1)
Basophils Relative: 0 % (ref 0–1)
Eosinophils Absolute: 0 10*3/uL (ref 0.0–0.7)
Eosinophils Relative: 0 % (ref 0–5)
HCT: 27.8 % — ABNORMAL LOW (ref 39.0–52.0)
Hemoglobin: 9.4 g/dL — ABNORMAL LOW (ref 13.0–17.0)
Lymphocytes Relative: 2 % — ABNORMAL LOW (ref 12–46)
Lymphs Abs: 0.3 10*3/uL — ABNORMAL LOW (ref 0.7–4.0)
MCH: 33.8 pg (ref 26.0–34.0)
MCHC: 33.8 g/dL (ref 30.0–36.0)
MCV: 100 fL (ref 78.0–100.0)
Monocytes Absolute: 0 10*3/uL — ABNORMAL LOW (ref 0.1–1.0)
Monocytes Relative: 0 % — ABNORMAL LOW (ref 3–12)
Neutro Abs: 11.8 10*3/uL — ABNORMAL HIGH (ref 1.7–7.7)
Neutrophils Relative %: 98 % — ABNORMAL HIGH (ref 43–77)
Platelets: 146 10*3/uL — ABNORMAL LOW (ref 150–400)
RBC: 2.78 MIL/uL — ABNORMAL LOW (ref 4.22–5.81)
RDW: 14.1 % (ref 11.5–15.5)
WBC: 12.1 10*3/uL — ABNORMAL HIGH (ref 4.0–10.5)

## 2013-04-29 LAB — TROPONIN I: Troponin I: <0.3 ng/mL (ref <0.30)

## 2013-04-29 LAB — URINALYSIS, ROUTINE W REFLEX MICROSCOPIC
Bilirubin Urine: NEGATIVE
Glucose, UA: NEGATIVE mg/dL
Hgb urine dipstick: NEGATIVE
Ketones, ur: NEGATIVE mg/dL
Leukocytes, UA: NEGATIVE
Nitrite: NEGATIVE
Protein, ur: NEGATIVE mg/dL
Specific Gravity, Urine: 1.015 (ref 1.005–1.030)
Urobilinogen, UA: 0.2 mg/dL (ref 0.0–1.0)
pH: 6 (ref 5.0–8.0)

## 2013-04-29 LAB — BASIC METABOLIC PANEL
BUN: 33 mg/dL — ABNORMAL HIGH (ref 6–23)
CO2: 29 mEq/L (ref 19–32)
Calcium: 9.3 mg/dL (ref 8.4–10.5)
Chloride: 96 mEq/L (ref 96–112)
Creatinine, Ser: 1.27 mg/dL (ref 0.50–1.35)
GFR calc Af Amer: 63 mL/min — ABNORMAL LOW (ref >90)
GFR calc non Af Amer: 54 mL/min — ABNORMAL LOW (ref >90)
Glucose, Bld: 147 mg/dL — ABNORMAL HIGH (ref 70–99)
Potassium: 4.1 mEq/L (ref 3.5–5.1)
Sodium: 134 mEq/L — ABNORMAL LOW (ref 135–145)

## 2013-04-29 LAB — CREATININE, SERUM: Creatinine, Ser: 1.08 mg/dL (ref 0.50–1.35)

## 2013-04-29 LAB — CBC
HCT: 26.6 % — ABNORMAL LOW (ref 39.0–52.0)
MCH: 33.5 pg (ref 26.0–34.0)
Platelets: 125 10*3/uL — ABNORMAL LOW (ref 150–400)
RBC: 2.66 MIL/uL — ABNORMAL LOW (ref 4.22–5.81)
RDW: 14.2 % (ref 11.5–15.5)
WBC: 9.3 10*3/uL (ref 4.0–10.5)

## 2013-04-29 LAB — DIGOXIN LEVEL: Digoxin Level: 0.9 ng/mL (ref 0.8–2.0)

## 2013-04-29 MED ORDER — OSELTAMIVIR PHOSPHATE 75 MG PO CAPS
75.0000 mg | ORAL_CAPSULE | Freq: Two times a day (BID) | ORAL | Status: DC
  Administered 2013-04-29: 75 mg via ORAL
  Filled 2013-04-29 (×3): qty 1

## 2013-04-29 MED ORDER — VITAMIN D3 25 MCG (1000 UNIT) PO TABS
1000.0000 [IU] | ORAL_TABLET | Freq: Every day | ORAL | Status: DC
  Administered 2013-04-30 – 2013-05-07 (×8): 1000 [IU] via ORAL
  Filled 2013-04-29 (×8): qty 1

## 2013-04-29 MED ORDER — VANCOMYCIN HCL 10 G IV SOLR
1250.0000 mg | Freq: Once | INTRAVENOUS | Status: AC
  Administered 2013-04-29: 1250 mg via INTRAVENOUS
  Filled 2013-04-29: qty 1250

## 2013-04-29 MED ORDER — DEXTROSE 5 % IV SOLN
1.0000 g | INTRAVENOUS | Status: DC
  Filled 2013-04-29: qty 1

## 2013-04-29 MED ORDER — SODIUM CHLORIDE 0.9 % IV BOLUS (SEPSIS): 1000.0000 mL | Freq: Once | INTRAVENOUS | Status: DC

## 2013-04-29 MED ORDER — SODIUM CHLORIDE 0.9 % IV BOLUS (SEPSIS)
1000.0000 mL | Freq: Once | INTRAVENOUS | Status: AC
  Administered 2013-04-29: 1000 mL via INTRAVENOUS

## 2013-04-29 MED ORDER — ONDANSETRON HCL 4 MG PO TABS: 4.0000 mg | ORAL_TABLET | Freq: Four times a day (QID) | ORAL | Status: DC | PRN

## 2013-04-29 MED ORDER — VANCOMYCIN HCL IN DEXTROSE 750-5 MG/150ML-% IV SOLN: 750.0000 mg | INTRAVENOUS | Status: DC

## 2013-04-29 MED ORDER — ENOXAPARIN SODIUM 40 MG/0.4ML ~~LOC~~ SOLN
40.0000 mg | SUBCUTANEOUS | Status: DC
  Administered 2013-04-29 – 2013-05-01 (×3): 40 mg via SUBCUTANEOUS
  Filled 2013-04-29 (×4): qty 0.4

## 2013-04-29 MED ORDER — SENNOSIDES-DOCUSATE SODIUM 8.6-50 MG PO TABS
1.0000 | ORAL_TABLET | Freq: Every evening | ORAL | Status: DC | PRN
  Administered 2013-05-01: 22:00:00 1 via ORAL
  Filled 2013-04-29: qty 1

## 2013-04-29 MED ORDER — VANCOMYCIN HCL IN DEXTROSE 750-5 MG/150ML-% IV SOLN
750.0000 mg | INTRAVENOUS | Status: DC
  Administered 2013-04-30: 750 mg via INTRAVENOUS
  Filled 2013-04-29 (×3): qty 150

## 2013-04-29 MED ORDER — LEVALBUTEROL HCL 0.63 MG/3ML IN NEBU
0.6300 mg | INHALATION_SOLUTION | Freq: Four times a day (QID) | RESPIRATORY_TRACT | Status: DC | PRN
  Administered 2013-04-30 – 2013-05-01 (×3): 0.63 mg via RESPIRATORY_TRACT
  Filled 2013-04-29 (×3): qty 3

## 2013-04-29 MED ORDER — DEXTROSE 5 % IV SOLN: 1.0000 g | INTRAVENOUS | Status: DC

## 2013-04-29 MED ORDER — SODIUM CHLORIDE 0.9 % IV SOLN
INTRAVENOUS | Status: DC
  Administered 2013-04-29: 20:00:00 via INTRAVENOUS
  Administered 2013-04-30: 50 mL/h via INTRAVENOUS
  Administered 2013-05-01 – 2013-05-06 (×5): via INTRAVENOUS

## 2013-04-29 MED ORDER — FOLIC ACID 1 MG PO TABS
1.0000 mg | ORAL_TABLET | Freq: Every day | ORAL | Status: DC
  Administered 2013-04-30 – 2013-05-07 (×8): 1 mg via ORAL
  Filled 2013-04-29 (×8): qty 1

## 2013-04-29 MED ORDER — DEXTROSE 5 % IV SOLN
1.0000 g | INTRAVENOUS | Status: AC
  Administered 2013-04-29: 1 g via INTRAVENOUS
  Filled 2013-04-29: qty 1

## 2013-04-29 MED ORDER — ACETAMINOPHEN 325 MG PO TABS
650.0000 mg | ORAL_TABLET | Freq: Four times a day (QID) | ORAL | Status: DC | PRN
  Administered 2013-04-30 – 2013-05-01 (×2): 650 mg via ORAL
  Filled 2013-04-29 (×2): qty 2

## 2013-04-29 MED ORDER — LORATADINE 10 MG PO TABS
10.0000 mg | ORAL_TABLET | Freq: Every day | ORAL | Status: DC
  Administered 2013-04-30 – 2013-05-07 (×8): 10 mg via ORAL
  Filled 2013-04-29 (×8): qty 1

## 2013-04-29 MED ORDER — ACETAMINOPHEN 650 MG RE SUPP: 650.0000 mg | Freq: Four times a day (QID) | RECTAL | Status: DC | PRN

## 2013-04-29 MED ORDER — ALBUTEROL SULFATE (5 MG/ML) 0.5% IN NEBU
5.0000 mg | INHALATION_SOLUTION | Freq: Once | RESPIRATORY_TRACT | Status: AC
  Administered 2013-04-29: 5 mg via RESPIRATORY_TRACT
  Filled 2013-04-29: qty 1

## 2013-04-29 MED ORDER — ONDANSETRON HCL 4 MG/2ML IJ SOLN: 4.0000 mg | Freq: Four times a day (QID) | INTRAMUSCULAR | Status: DC | PRN

## 2013-04-29 MED ORDER — ADULT MULTIVITAMIN W/MINERALS CH
1.0000 | ORAL_TABLET | Freq: Every day | ORAL | Status: DC
  Administered 2013-04-30 – 2013-05-07 (×8): 1 via ORAL
  Filled 2013-04-29 (×8): qty 1

## 2013-04-29 NOTE — Progress Notes (Addendum)
ANTIBIOTIC CONSULT NOTE - INITIAL  Pharmacy Consult for cefepime and vancomcyin Indication: rule out pneumonia  No Known Allergies  Patient Measurements:   Reported Body Weight: 54.5kg  Vital Signs: Temp: 97.7 F (36.5 C) (12/19 1129) Temp src: Oral (12/19 1129) BP: 83/46 mmHg (12/19 1454) Pulse Rate: 78 (12/19 1159)  Labs:  Recent Labs  04/29/13 1130  WBC 12.1*  HGB 9.4*  PLT 146*  CREATININE 1.27     Medical History: Past Medical History  Diagnosis Date  . Lung cancer   . Legionella pneumonia   . CHF (congestive heart failure)     Medications:  Scheduled:   Infusions:  . ceFEPime (MAXIPIME) IV    . sodium chloride    . vancomycin     Assessment: 76 yoM admitted 12/19 with suspected pneumonia. Pharmacy has been consulted to dose cefepime and vancomycin  Patient reported weight 54.5kg  SCr 1.27 for CrCl 39 mL/min (GC), 52 mL/min (normalized)  Patient is currently afebrile  Patient is hypotensive  WBC elevated at 12.1  LLL opacity seen on CXR 12/19  No cultures have been drawn this admission  Noted cefepime 1g IV x1 ordered to be administered in the ED  Vancomycin loading dose:  Vd= 0.72 L x 54.5kg = 39  Loading dose = Vd x Cp = 39 L x 35 mcg/ mL= 1365mg  --> 1250mg  loading dose   Goal of Therapy:  Vancomycin trough level 15-20 mcg/ml eradication of infection  Plan:  - cefepime 1g IV q24h - vancomycin 1250mg  IV x 1 as a loading dose - vancomycin 750mg  IV q24h starting 12/20 at 1600 - vancomycin trough at steady state if indicated - follow-up clinical course, culture results, renal function - follow-up antibiotic de-escalation and length of therapy  Thank you for the consult.  Tomi Bamberger, PharmD, BCPS Clinical Pharmacist Pager: [phone] Pharmacy: [phone] 04/29/2013 4:06 PM

## 2013-04-29 NOTE — ED Provider Notes (Signed)
CSN: [id number]     Arrival date & time 04/29/13  1103 History   First MD Initiated Contact with Patient 04/29/13 1117     Chief Complaint  Patient presents with  . Shortness of Breath   (Consider location/radiation/quality/duration/timing/severity/associated sxs/prior Treatment) HPI  Pt with two MRN, this one and MRN: [id number].  72yM with worsening dyspnea for past 2 days. Hx of metastatic non-small cell lung cancer, adenocarcinoma. S/p radiotherapy for brain lesions under care of Dr Kathrynn Running, s/p palliative radiation and currently chemotherapy under care of Dr Arbutus Ped. Says he feels similar to way he was feeling prior to thoracentesis on Wednesday. No fever or chills. Denies new pain. Has cough, but not significantly changed. No unusual leg pain or swelling.    Past Medical History  Diagnosis Date  . Lung cancer   . Legionella pneumonia   . CHF (congestive heart failure)    History reviewed. No pertinent past surgical history. History reviewed. No pertinent family history. History  Substance Use Topics  . Smoking status: Former Games developer  . Smokeless tobacco: Not on file  . Alcohol Use: No    Review of Systems  All systems reviewed and negative, other than as noted in HPI.   Allergies  Review of patient's allergies indicates no known allergies.  Home Medications   Current Outpatient Rx  Name  Route  Sig  Dispense  Refill  . carvedilol (COREG) 3.125 MG tablet   Oral   Take 3.125 mg by mouth 2 (two) times daily with a meal.         . cetirizine (ZYRTEC) 10 MG tablet   Oral   Take 10 mg by mouth daily.         . cholecalciferol (VITAMIN D) 1000 UNITS tablet   Oral   Take 1,000 Units by mouth daily.         . digoxin (LANOXIN) 0.125 MG tablet   Oral   Take 0.125 mg by mouth daily.         . folic acid (FOLVITE) 1 MG tablet   Oral   Take 1 mg by mouth daily.         . furosemide (LASIX) 20 MG tablet   Oral   Take 20 mg by mouth as needed for  fluid.         Marland Kitchen lisinopril (PRINIVIL,ZESTRIL) 2.5 MG tablet   Oral   Take 2.5 mg by mouth daily.         . Multiple Vitamin (MULTIVITAMIN WITH MINERALS) TABS tablet   Oral   Take 1 tablet by mouth daily.         Marland Kitchen PRESCRIPTION MEDICATION      Gemzar.         . prochlorperazine (COMPAZINE) 10 MG tablet   Oral   Take 10 mg by mouth every 6 (six) hours as needed for nausea or vomiting.          BP 83/46  Pulse 78  Temp(Src) 97.7 F (36.5 C) (Oral)  Resp 18  SpO2 100% Physical Exam  Nursing note and vitals reviewed. Constitutional: He appears well-developed and well-nourished. No distress.  Laying in bed. Chronically ill appearing.   HENT:  Head: Normocephalic and atraumatic.  Eyes: Conjunctivae are normal. Right eye exhibits no discharge. Left eye exhibits no discharge.  Neck: Neck supple.  Cardiovascular: Normal rate, regular rhythm and normal heart sounds.  Exam reveals no gallop and no friction rub.   No murmur heard. Pulmonary/Chest: He  is in respiratory distress.  Tachypnea. Decreased breath sounds b/l.   Abdominal: Soft. He exhibits no distension. There is no tenderness.  Musculoskeletal: He exhibits no edema and no tenderness.  Neurological:  Drowsy, but awakens to conversational voice. Follows commands, and answers questions appropriately.   Skin: Skin is warm and dry. He is not diaphoretic.  Psychiatric: He has a normal mood and affect. His behavior is normal. Thought content normal.    ED Course  Procedures (including critical care time) Labs Review Labs Reviewed  BASIC METABOLIC PANEL - Abnormal; Notable for the following:    Sodium 134 (*)    Glucose, Bld 147 (*)    BUN 33 (*)    GFR calc non Af Amer 54 (*)    GFR calc Af Amer 63 (*)    All other components within normal limits  CBC WITH DIFFERENTIAL - Abnormal; Notable for the following:    WBC 12.1 (*)    RBC 2.78 (*)    Hemoglobin 9.4 (*)    HCT 27.8 (*)    Platelets 146 (*)     Neutrophils Relative % 98 (*)    Neutro Abs 11.8 (*)    Lymphocytes Relative 2 (*)    Lymphs Abs 0.3 (*)    Monocytes Relative 0 (*)    Monocytes Absolute 0.0 (*)    All other components within normal limits  TROPONIN I  URINALYSIS, ROUTINE W REFLEX MICROSCOPIC  DIGOXIN LEVEL  TYPE AND SCREEN   Imaging Review [place] Chest  04/29/2013   CLINICAL DATA:  Patient with history of metastatic non-small cell lung carcinoma, dyspnea, recurrent left pleural effusion. Request is made for therapeutic left thoracentesis.  EXAM: CHEST ULTRASOUND  COMPARISON:  Previous thoracentesis on 04/27/2013  FINDINGS: On limited ultrasound of the left posterior chest region today there is minimal free pleural fluid noted. The collection is extensively multi-septated size multiloculated. . Due to these findings therapeutic thoracentesis was not performed at this time.  IMPRESSION: Limited ultrasound left posterior chest today reveals extensive multiloculated/multi-septated collection in pleural space. Minimal free fluid noted. Thoracentesis was not performed at this time. Recommend consultation with thoracic surgery for further evaluation.  Read by: Jeananne Rama ,P.A.-C.   Electronically Signed   By: Irish Lack M.D.   On: 04/29/2013 15:04   Dg Chest Portable 1 View  04/29/2013   CLINICAL DATA:  Shortness of breath.  EXAM: PORTABLE CHEST - 1 VIEW  COMPARISON:  None.  FINDINGS: Opacity obscures the left heart border and left hemidiaphragm. There is coarse irregular opacity in the left upper lobe extending to the apex. Irregularly thickened bronchovascular/ interstitial markings are noted throughout the aerated left lung and in the right lung base with milder peripheral for interstitial scarring in the right upper lobe.  No pneumothorax. The cardiac silhouette is normal in overall size. The mediastinum is normal in contour.  IMPRESSION: Left lung base opacity is likely combination pleural fluid with either atelectasis,  infiltrate or a combination.  Left upper lobe irregular lung opacity is most suggestive of scarring although infiltrate is also possible.  No convincing acute findings in the right lung. There is evidence of COPD.   Electronically Signed   By: Amie Portland M.D.   On: 04/29/2013 12:14    EKG Interpretation    Date/Time:  Friday April 29 2013 11:28:19 EST Ventricular Rate:  97 PR Interval:  142 QRS Duration: 81 QT Interval:  306 QTC Calculation: 389 R Axis:   3 Text  Interpretation:  Sinus rhythm Low voltage, precordial leads Non-specific ST-t changes Confirmed by Juleen China  MD, Quantavious Eggert ([id number]) on 04/29/2013 4:12:56 PM            MDM   1. Sepsis   2. Metastatic cancer      72yM with dyspnea. Likely multifactorial. Hx of non small cell lung CA. Multiloculated collection, but no significant free fluid amenable to thoracentesis.  Will discuss with CTS to see if there is anything for them to potentially add. Hemoglobin 9.4 from 11.5 three days ago. Likely chemo related and has previously needed transfusion. Will cover for infection as concurrent hypotension concerning for possible sepsis. Recent cardiology note mentions SBP in 90s, but pt reports that takes BP regularly and consistently with SBP around 110.  IVF but careful with hx of systolic HF, EF 30-35%. Pt DNR. Will discuss with medicine for admission.    Raeford Razor, MD 04/29/13 [phone]

## 2013-04-29 NOTE — ED Notes (Signed)
Pt transported to US

## 2013-04-29 NOTE — H&P (Addendum)
[facility]          History and Physical    PCP:   No primary provider on file.   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 He states that today he had progressive shortness of breath had to increase his oxygen from his usual of 3-4 to about 5 at home without result. His wife brought him into the hospital because they thought he probably had more fluid in his lungs to be drained. On arrival he was found to be hypotensive with blood pressures in the 80s over 40s. He has been afebrile, chest x-ray shows a left lung base opacity that is likely a combination of pleural fluid with atelectasis infiltrate or combination. There is also a left upper lobe irregular opacity most adjustable scarring although infiltrate is also possible. He was sent to interventional radiology for another possible thoracenteses, however there was no free-flowing, layering effusion and as such that was aborted. His O2 sats were 80% on 5 L on arrival and have improved to 100% on a nonrebreather. We have been asked to admit him for further evaluation and management. He is a DO NOT RESUSCITATE. Please note that this medical record number does not have any information however he has another chart with all his information. Medical record number is [id number].  Allergies:  No Known Allergies    Past Medical History  Diagnosis Date  . Lung cancer   . Legionella pneumonia   . CHF (congestive heart failure)     Past Surgical History  Procedure Laterality Date  . Ganglion cyst excision    . Nose surgery      Prior to Admission medications   Medication Sig Start Date End Date Taking? Authorizing Provider  carvedilol (COREG) 3.125 MG  tablet Take 3.125 mg by mouth 2 (two) times daily with a meal.   Yes Historical Provider, MD  cetirizine (ZYRTEC) 10 MG tablet Take 10 mg by mouth daily.   Yes Historical Provider, MD  cholecalciferol (VITAMIN D) 1000 UNITS tablet Take 1,000 Units by mouth daily.   Yes Historical Provider, MD  digoxin (LANOXIN) 0.125 MG tablet Take 0.125 mg by mouth daily.   Yes Historical Provider, MD  folic acid (FOLVITE) 1 MG tablet Take 1 mg by mouth daily.   Yes Historical Provider, MD  furosemide (LASIX) 20 MG tablet Take 20 mg by mouth as needed for fluid.   Yes Historical Provider, MD  lisinopril (PRINIVIL,ZESTRIL) 2.5 MG tablet Take 2.5 mg by mouth daily.   Yes Historical Provider, MD  Multiple Vitamin (MULTIVITAMIN WITH MINERALS) TABS tablet Take 1 tablet by mouth daily.   Yes Historical Provider, MD  PRESCRIPTION MEDICATION Gemzar.   Yes Historical Provider, MD  prochlorperazine (COMPAZINE) 10 MG tablet Take 10 mg by mouth every 6 (six) hours as needed for nausea or vomiting.   Yes Historical Provider, MD    Social History:  reports that he quit smoking about 7 months ago. His smoking use included Cigarettes. He smoked 0.00 packs per day for 60 years. He has never used smokeless tobacco. He reports that he does not drink alcohol or use illicit drugs.  History reviewed. No pertinent family history.  Review of Systems:  Constitutional: Denies fever,  chills, diaphoresis, endorses appetite change and fatigue.  HEENT: Denies photophobia, eye pain, redness, hearing loss, ear pain, congestion, sore throat, rhinorrhea, sneezing, mouth sores, trouble swallowing, neck pain, neck stiffness and tinnitus.   Respiratory: Denies cough, chest tightness,  and wheezing.   positive for dyspnea on exertion or shortness of breath. Cardiovascular: Denies chest pain, palpitations and leg swelling.  Gastrointestinal: Denies nausea, vomiting, abdominal pain, diarrhea, constipation, blood in stool and abdominal distention.   Genitourinary: Denies dysuria, urgency, frequency, hematuria, flank pain and difficulty urinating.  Endocrine: Denies: hot or cold intolerance, sweats, changes in hair or nails, polyuria, polydipsia. Musculoskeletal: Denies myalgias, back pain, joint swelling, arthralgias and gait problem.  Skin: Denies pallor, rash and wound.  Neurological: Denies dizziness, seizures, syncope, weakness, light-headedness, numbness and headaches.  Hematological: Denies adenopathy. Easy bruising, personal or family bleeding history  Psychiatric/Behavioral: Denies suicidal ideation, mood changes, confusion, nervousness, sleep disturbance and agitation   Physical Exam: Blood pressure 99/45, pulse 104, temperature 98.2 F (36.8 C), temperature source Rectal, resp. rate 20, SpO2 95.00%. General: Alert, awake, oriented x3, pleasant and cooperative with exam. HEENT: Normocephalic, atraumatic, pupils equal round and reactive to light, wearing a nonrebreather mask. Neck: Supple, no JVD, no lymphadenopathy, no bruits, no goiter. Cardiovascular: Regular rate and rhythm, no murmurs, rubs or gallops auscultated. Lungs: Decreased breath sounds at the left base. Abdomen: Soft, nontender, nondistended, positive bowel sounds. Extremities: Trace bilateral pitting edema. Neurologic: Grossly intact and nonfocal  Labs on Admission:  Results for orders placed during the hospital encounter of 04/29/13 (from the past 48 hour(s))  BASIC METABOLIC PANEL     Status: Abnormal   Collection Time    04/29/13 11:30 AM      Result Value Range   Sodium 134 (*) 135 - 145 mEq/L   Potassium 4.1  3.5 - 5.1 mEq/L   Chloride 96  96 - 112 mEq/L   CO2 29  19 - 32 mEq/L   Glucose, Bld 147 (*) 70 - 99 mg/dL   BUN 33 (*) 6 - 23 mg/dL   Creatinine, Ser 1.61  0.50 - 1.35 mg/dL   Calcium 9.3  8.4 - 09.6 mg/dL   GFR calc non Af Amer 54 (*) >90 mL/min   GFR calc Af Amer 63 (*) >90 mL/min   Comment: (NOTE)     The eGFR has been calculated using  the CKD EPI equation.     This calculation has not been validated in all clinical situations.     eGFR's persistently <90 mL/min signify possible Chronic Kidney     Disease.  CBC WITH DIFFERENTIAL     Status: Abnormal   Collection Time    04/29/13 11:30 AM      Result Value Range   WBC 12.1 (*) 4.0 - 10.5 K/uL   RBC 2.78 (*) 4.22 - 5.81 MIL/uL   Hemoglobin 9.4 (*) 13.0 - 17.0 g/dL   HCT 04.5 (*) 40.9 - 81.1 %   MCV 100.0  78.0 - 100.0 fL   MCH 33.8  26.0 - 34.0 pg   MCHC 33.8  30.0 - 36.0 g/dL   RDW 91.4  78.2 - 95.6 %   Platelets 146 (*) 150 - 400 K/uL   Neutrophils Relative % 98 (*) 43 - 77 %   Neutro Abs 11.8 (*) 1.7 - 7.7 K/uL   Lymphocytes Relative 2 (*) 12 - 46 %   Lymphs Abs 0.3 (*) 0.7 - 4.0 K/uL   Monocytes Relative 0 (*) 3 -  12 %   Monocytes Absolute 0.0 (*) 0.1 - 1.0 K/uL   Eosinophils Relative 0  0 - 5 %   Eosinophils Absolute 0.0  0.0 - 0.7 K/uL   Basophils Relative 0  0 - 1 %   Basophils Absolute 0.0  0.0 - 0.1 K/uL  TROPONIN I     Status: None   Collection Time    04/29/13 11:30 AM      Result Value Range   Troponin I <0.30  <0.30 ng/mL   Comment:            Due to the release kinetics of cTnI,     a negative result within the first hours     of the onset of symptoms does not rule out     myocardial infarction with certainty.     If myocardial infarction is still suspected,     repeat the test at appropriate intervals.  TYPE AND SCREEN     Status: None   Collection Time    04/29/13  4:38 PM      Result Value Range   ABO/RH(D) A POS     Antibody Screen NEG     Sample Expiration 05/02/2013    DIGOXIN LEVEL     Status: None   Collection Time    04/29/13  4:38 PM      Result Value Range   Digoxin Level 0.9  0.8 - 2.0 ng/mL    Radiological Exams on Admission: [place] Chest  04/29/2013   CLINICAL DATA:  Patient with history of metastatic non-small cell lung carcinoma, dyspnea, recurrent left pleural effusion. Request is made for therapeutic left  thoracentesis.  EXAM: CHEST ULTRASOUND  COMPARISON:  Previous thoracentesis on 04/27/2013  FINDINGS: On limited ultrasound of the left posterior chest region today there is minimal free pleural fluid noted. The collection is extensively multi-septated size multiloculated. . Due to these findings therapeutic thoracentesis was not performed at this time.  IMPRESSION: Limited ultrasound left posterior chest today reveals extensive multiloculated/multi-septated collection in pleural space. Minimal free fluid noted. Thoracentesis was not performed at this time. Recommend consultation with thoracic surgery for further evaluation.  Read by: Jeananne Rama ,P.A.-C.   Electronically Signed   By: Irish Lack M.D.   On: 04/29/2013 15:04   Dg Chest Portable 1 View  04/29/2013   CLINICAL DATA:  Shortness of breath.  EXAM: PORTABLE CHEST - 1 VIEW  COMPARISON:  None.  FINDINGS: Opacity obscures the left heart border and left hemidiaphragm. There is coarse irregular opacity in the left upper lobe extending to the apex. Irregularly thickened bronchovascular/ interstitial markings are noted throughout the aerated left lung and in the right lung base with milder peripheral for interstitial scarring in the right upper lobe.  No pneumothorax. The cardiac silhouette is normal in overall size. The mediastinum is normal in contour.  IMPRESSION: Left lung base opacity is likely combination pleural fluid with either atelectasis, infiltrate or a combination.  Left upper lobe irregular lung opacity is most suggestive of scarring although infiltrate is also possible.  No convincing acute findings in the right lung. There is evidence of COPD.   Electronically Signed   By: Amie Portland M.D.   On: 04/29/2013 12:14    Assessment/Plan Principal Problem:   Acute-on-chronic respiratory failure Active Problems:   HCAP (healthcare-associated pneumonia)   Lung cancer   Brain metastasis   Anemia, chronic disease    Acute on chronic  respiratory failure -Given his hypotension, shortness  of breath and possibility of an infiltrate on chest x-ray, I believe that the acute component of his respiratory failure is probably related to a hospital-acquired pneumonia on top of his baseline shortness of breath from his metastatic lung cancer and malignant pleural effusions. -Not sufficient pleural fluid for a therapeutic thoracentesis.  Hospital-acquired pneumonia -Pancultures including blood, sputum. -Check Legionella/strep pneumo antigen, especially since he has a history of Legionella pneumonia. -Check influenza PCR and we'll presumptively start him on Tamiflu pending results. -Vancomycin/cefepime for hospital-acquired coverage.  Metastatic lung cancer -Continue treatment as per oncology. -Have added Dr. Shirline Frees as a consult so he can be aware that this patient is in the hospital.  Anemia of chronic disease -Hemoglobin is 9.4. -Monitor for need of transfusion. -Given his history of congestive heart failure would transfuse at a higher threshold than the typical 7.  Chronic systolic congestive heart failure -Known ejection fraction of 30-35%. -Appears compensated at this time. -Gentle fluid hydration.  CODE STATUS -DO NOT RESUSCITATE  DVT prophylaxis -Lovenox.  Disposition -Admit to step down unit overnight for close monitoring of blood pressure. -Patient is a DO NOT RESUSCITATE, however they (patient and his wife) do agree to antibiotics and fluids at this time. -Consider palliative care consult for goals of care if he does not improve in the next few days.  Time Spent on Admission: 75 minutes  HERNANDEZ ACOSTA,ESTELA [facility] Pager: [phone] 04/29/2013, 5:48 PM

## 2013-04-29 NOTE — ED Notes (Signed)
Pt returned from IR and they were unable to get the fluid and the IR will call MD.

## 2013-04-29 NOTE — Telephone Encounter (Signed)
VERBAL ORDER AND READ BACK TO DR.MOHAMED- PT. NEEDS TO GO TO THE EMERGENCY ROOM TO BE EVALUATED.

## 2013-04-29 NOTE — ED Notes (Signed)
Patient transported to IR 

## 2013-04-29 NOTE — ED Notes (Signed)
Pt made aware of need for UA 

## 2013-04-29 NOTE — Progress Notes (Signed)
Utilization Review completed.  Kinzey Sheriff RN CM  

## 2013-04-29 NOTE — ED Notes (Signed)
PT has hx lung ca. Had fluid removed from lungs on Wednesday, since then has had sob. Pt on 3L [place] home O2. O2 sats 80s on arrival.

## 2013-04-29 NOTE — Progress Notes (Signed)
Patient ID: IGNACIO LOWDER, male   DOB: 10/07/1940, 72 y.o.   MRN: [id number] Pt presented to [place] dept today for therapeutic left thoracentesis (hx of met NSC lung ca) He is s/p  therapeutic left thoracentesis on 12/17 yielding 950 cc's. On limited US post left chest today there is minimal free fluid noted. The collection is extensively multiloculated and therapeutic tap at this time is not indicated. Findings d/w Dr. Fredia Sorrow and Dr. Ricki Miller. Recommend thoracic surg consult for further evaluation.

## 2013-04-30 DIAGNOSIS — J9620 Acute and chronic respiratory failure, unspecified whether with hypoxia or hypercapnia: Secondary | ICD-10-CM

## 2013-04-30 DIAGNOSIS — D638 Anemia in other chronic diseases classified elsewhere: Secondary | ICD-10-CM

## 2013-04-30 LAB — BASIC METABOLIC PANEL
BUN: 25 mg/dL — ABNORMAL HIGH (ref 6–23)
Calcium: 8.6 mg/dL (ref 8.4–10.5)
Creatinine, Ser: 1.04 mg/dL (ref 0.50–1.35)
GFR calc Af Amer: 80 mL/min — ABNORMAL LOW (ref >90)
GFR calc non Af Amer: 69 mL/min — ABNORMAL LOW (ref >90)
Glucose, Bld: 95 mg/dL (ref 70–99)
Sodium: 132 mEq/L — ABNORMAL LOW (ref 135–145)

## 2013-04-30 LAB — INFLUENZA PANEL BY PCR (TYPE A & B): Influenza A By PCR: NEGATIVE

## 2013-04-30 LAB — CBC
HCT: 32.5 % — ABNORMAL LOW (ref 39.0–52.0)
Hemoglobin: 8 g/dL — ABNORMAL LOW (ref 13.0–17.0)
MCH: 33.9 pg (ref 26.0–34.0)
MCHC: 33.9 g/dL (ref 30.0–36.0)
MCHC: 34.8 g/dL (ref 30.0–36.0)
Platelets: 125 10*3/uL — ABNORMAL LOW (ref 150–400)
Platelets: 82 10*3/uL — ABNORMAL LOW (ref 150–400)
RBC: 2.36 MIL/uL — ABNORMAL LOW (ref 4.22–5.81)
RBC: 3.46 MIL/uL — ABNORMAL LOW (ref 4.22–5.81)
RDW: 14.2 % (ref 11.5–15.5)
WBC: 4.4 10*3/uL (ref 4.0–10.5)

## 2013-04-30 LAB — EXPECTORATED SPUTUM ASSESSMENT W GRAM STAIN, RFLX TO RESP C

## 2013-04-30 LAB — EXPECTORATED SPUTUM ASSESSMENT W REFEX TO RESP CULTURE

## 2013-04-30 LAB — TROPONIN I: Troponin I: <0.3 ng/mL (ref <0.30)

## 2013-04-30 LAB — PREPARE RBC (CROSSMATCH)

## 2013-04-30 MED ORDER — FUROSEMIDE 10 MG/ML IJ SOLN
20.0000 mg | Freq: Once | INTRAMUSCULAR | Status: AC
  Administered 2013-04-30: 20 mg via INTRAVENOUS
  Filled 2013-04-30: qty 2

## 2013-04-30 MED ORDER — DEXTROSE 5 % IV SOLN
1.0000 g | Freq: Two times a day (BID) | INTRAVENOUS | Status: DC
  Administered 2013-04-30 – 2013-05-01 (×3): 1 g via INTRAVENOUS
  Filled 2013-04-30 (×4): qty 1

## 2013-04-30 MED ORDER — MORPHINE SULFATE 2 MG/ML IJ SOLN
1.0000 mg | Freq: Once | INTRAMUSCULAR | Status: AC
  Administered 2013-04-30: 1 mg via INTRAVENOUS
  Filled 2013-04-30: qty 1

## 2013-04-30 NOTE — Progress Notes (Addendum)
[facility] progress note. Chief complaint. Chest pressure. History present illness. This 73 year old male in hospital with acute on chronic respiratory failure and sepsis thought secondary to healthcare acquired pneumonia. Patient complained to nursing of chest pressure this evening. 12-lead EKG was obtained and it shows sinus rhythm with nonspecific ST changes though EKG does not look significantly changed from prior EKG and does not currently suggest acute ischemia. Patient denies diaphoresis, nausea but does state the pain now has located at the upper back. Patient does have a significant history of congestive heart failure. Vital signs. Temperature 98.6, pulse 107, respiration 22, blood pressure 123/51. O2 sats 95%. General appearance. Frail elderly male who is alert, cooperative, and in no distress. Cardiac. Rate and rhythm regular. No jugular venous distention or edema. Lungs. Breath sounds are reduced midlung to base bilaterally. No distress. Abdomen. Soft with positive bowel sounds. Somewhat protuberant. No pain with palpation. Impression/plan. Problem #1. Chest pressure. Unclear etiology though chest pressure has resolved and patient now is complaining of upper back pain. Will provide him a dose of 1 mg morphine for pain. I've ordered troponin now and then every 6 hours for a total of 3 sets. We'll repeat a 12-lead EKG in about 6 hours. Follow for results.  Addendum: Patient's first troponin resulted normal less than 0.3. Second troponin resulted elevated however, 0.44. Discussed the case with Doctor [facility] on-call cardiology for patient's previous cardiologist Doctor Nasher. Cardiology recommends medical management and I have added aspirin 162 mg daily, Metroprolol 12.5 mg twice daily, Zocor 20 mg daily, 2-D echocardiogram. Cardiology will see the patient in consult later this morning. A repeat EKG is still pending.

## 2013-04-30 NOTE — Progress Notes (Signed)
[facility] PROGRESS NOTE  [name] RUE:[id number] DOB: 01/20/1941 DOA: 04/29/2013 PCP: No primary provider on file.  Assessment/Plan: Acute on Chronic Respiratory Failure -Suspect HCAP on top of baseline metastatic lung cancer and malignant pleural effusions. -Seems clinically improved today on [place] oxygen. -Not sufficient pleural fluid for thoracentesis.  Sepsis -2/2 HCAP. -Septic parameters improved. -BP, HR better. -May restart BP meds tomorrow if BP remains stable.  HCAP -Continue vanc/cefepime. -Cx data pending. -Influenza PCR negative: DC tamiflu, DC droplet precautions.  AOCD -Hb has further decreased to 8 today. -Given his h/o CAD/CHF and the fact that the anemia could be contributing to his SOB, will transfuse 2 units of PRBCs today.  Chronic Systolic CHF -Compensated.   Code Status: DNR Family Communication: Discussed with wife at bedside.  Disposition Plan: Transfer to telemetry. Transfuse.   Consultants:  None   Antibiotics:  Vancomycin 04/29/13-->  Cefepime 04/29/13-->  Tamiflu 12/19-->04/30/13     Subjective: Sleeping. No WOB. Feels well.  Objective: Filed Vitals:   04/30/13 0000 04/30/13 0139 04/30/13 0400 04/30/13 0520  BP: 96/40   98/36  Pulse: 109   99  Temp: 98.2 F (36.8 C)  98.4 F (36.9 C)   TempSrc: Oral  Oral   Resp: 28   24  Height:      Weight:      SpO2: 94% 96%  94%    Intake/Output Summary (Last 24 hours) at 04/30/13 0844 Last data filed at 04/30/13 0500  Gross per 24 hour  Intake    500 ml  Output    450 ml  Net     50 ml   Filed Weights   04/29/13 2000  Weight: 61.3 kg (135 lb 2.3 oz)    Exam:   General:  Sleeping.  Cardiovascular: RRR  Respiratory: decreased BS left base.  Abdomen: S/NT/ND/+BS  Extremities: no C/C/E/+pulses   Neurologic:  Non-focal.  Data Reviewed: Basic Metabolic Panel:  Recent Labs Lab 04/29/13 1130 04/29/13 2047 04/30/13 0340  NA 134*  --  132*  K 4.1   --  4.2  CL 96  --  96  CO2 29  --  29  GLUCOSE 147*  --  95  BUN 33*  --  25*  CREATININE 1.27 1.08 1.04  CALCIUM 9.3  --  8.6   Liver Function Tests: No results found for this basename: AST, ALT, ALKPHOS, BILITOT, PROT, ALBUMIN,  in the last 168 hours No results found for this basename: LIPASE, AMYLASE,  in the last 168 hours No results found for this basename: AMMONIA,  in the last 168 hours CBC:  Recent Labs Lab 04/29/13 1130 04/29/13 2047 04/30/13 0340  WBC 12.1* 9.3 7.2  NEUTROABS 11.8*  --   --   HGB 9.4* 8.9* 8.0*  HCT 27.8* 26.6* 23.6*  MCV 100.0 100.0 100.0  PLT 146* 125* 125*   Cardiac Enzymes:  Recent Labs Lab 04/29/13 1130  TROPONINI <0.30   BNP (last 3 results) No results found for this basename: PROBNP,  in the last 8760 hours CBG: No results found for this basename: GLUCAP,  in the last 168 hours  Recent Results (from the past 240 hour(s))  MRSA PCR SCREENING     Status: None   Collection Time    04/29/13  7:51 PM      Result Value Range Status   MRSA by PCR NEGATIVE  NEGATIVE Final   Comment:  The GeneXpert MRSA Assay (FDA     approved for NASAL specimens     only), is one component of a     comprehensive MRSA colonization     surveillance program. It is not     intended to diagnose MRSA     infection nor to guide or     monitor treatment for     MRSA infections.  CULTURE, EXPECTORATED SPUTUM-ASSESSMENT     Status: None   Collection Time    04/30/13  2:18 AM      Result Value Range Status   Specimen Description SPU   Final   Special Requests NONE   Final   Sputum evaluation     Final   Value: THIS SPECIMEN IS ACCEPTABLE. RESPIRATORY CULTURE REPORT TO FOLLOW.   Report Status 04/30/2013 FINAL   Final     Studies: [place] Chest  04/29/2013   CLINICAL DATA:  Patient with history of metastatic non-small cell lung carcinoma, dyspnea, recurrent left pleural effusion. Request is made for therapeutic left thoracentesis.  EXAM: CHEST  ULTRASOUND  COMPARISON:  Previous thoracentesis on 04/27/2013  FINDINGS: On limited ultrasound of the left posterior chest region today there is minimal free pleural fluid noted. The collection is extensively multi-septated size multiloculated. . Due to these findings therapeutic thoracentesis was not performed at this time.  IMPRESSION: Limited ultrasound left posterior chest today reveals extensive multiloculated/multi-septated collection in pleural space. Minimal free fluid noted. Thoracentesis was not performed at this time. Recommend consultation with thoracic surgery for further evaluation.  Read by: Jeananne Rama ,P.A.-C.   Electronically Signed   By: Irish Lack M.D.   On: 04/29/2013 15:04   Dg Chest Portable 1 View  04/29/2013   CLINICAL DATA:  Shortness of breath.  EXAM: PORTABLE CHEST - 1 VIEW  COMPARISON:  None.  FINDINGS: Opacity obscures the left heart border and left hemidiaphragm. There is coarse irregular opacity in the left upper lobe extending to the apex. Irregularly thickened bronchovascular/ interstitial markings are noted throughout the aerated left lung and in the right lung base with milder peripheral for interstitial scarring in the right upper lobe.  No pneumothorax. The cardiac silhouette is normal in overall size. The mediastinum is normal in contour.  IMPRESSION: Left lung base opacity is likely combination pleural fluid with either atelectasis, infiltrate or a combination.  Left upper lobe irregular lung opacity is most suggestive of scarring although infiltrate is also possible.  No convincing acute findings in the right lung. There is evidence of COPD.   Electronically Signed   By: Amie Portland M.D.   On: 04/29/2013 12:14    Scheduled Meds: . ceFEPime (MAXIPIME) IV  1 g Intravenous Q24H  . cholecalciferol  1,000 Units Oral Daily  . enoxaparin (LOVENOX) injection  40 mg Subcutaneous Q24H  . folic acid  1 mg Oral Daily  . furosemide  20 mg Intravenous Once  .  loratadine  10 mg Oral Daily  . multivitamin with minerals  1 tablet Oral Daily  . oseltamivir  75 mg Oral BID  . vancomycin  750 mg Intravenous Q24H   Continuous Infusions: . sodium chloride 50 mL/hr (04/30/13 0523)    Principal Problem:   Acute-on-chronic respiratory failure Active Problems:   HCAP (healthcare-associated pneumonia)   Lung cancer   Brain metastasis   Anemia, chronic disease   Sepsis    Time spent: 35 minutes. Greater than 50% of this time was spent in direct contact with the patient  coordinating care.    Luis Payne  [facility] Pager [phone]  If 7PM-7AM, please contact night-coverage at [email or web address], password [facility] 04/30/2013, 8:44 AM  LOS: 1 day

## 2013-04-30 NOTE — Progress Notes (Signed)
Pt complains of "chest pressure". Pt received two units of blood on day shift. [facility] mid-level paged and notified. Obtained Vitals Signs and EKG. Reported findings to mid-level. I mg morphine ordered for pain and administered. Will continue to monitor.

## 2013-04-30 NOTE — Progress Notes (Signed)
Report called to 4E RN,

## 2013-05-01 DIAGNOSIS — A419 Sepsis, unspecified organism: Secondary | ICD-10-CM

## 2013-05-01 DIAGNOSIS — R079 Chest pain, unspecified: Secondary | ICD-10-CM

## 2013-05-01 DIAGNOSIS — I359 Nonrheumatic aortic valve disorder, unspecified: Secondary | ICD-10-CM

## 2013-05-01 LAB — BASIC METABOLIC PANEL
CO2: 29 mEq/L (ref 19–32)
Calcium: 8.9 mg/dL (ref 8.4–10.5)
Creatinine, Ser: 0.98 mg/dL (ref 0.50–1.35)
GFR calc non Af Amer: 79 mL/min — ABNORMAL LOW (ref >90)
Potassium: 4.1 mEq/L (ref 3.5–5.1)
Sodium: 133 mEq/L — ABNORMAL LOW (ref 135–145)

## 2013-05-01 LAB — CBC
Hemoglobin: 11.5 g/dL — ABNORMAL LOW (ref 13.0–17.0)
Platelets: 74 10*3/uL — ABNORMAL LOW (ref 150–400)
RBC: 3.52 MIL/uL — ABNORMAL LOW (ref 4.22–5.81)
RDW: 16 % — ABNORMAL HIGH (ref 11.5–15.5)
WBC: 4.2 10*3/uL (ref 4.0–10.5)

## 2013-05-01 LAB — LEGIONELLA ANTIGEN, URINE: Legionella Antigen, Urine: NEGATIVE

## 2013-05-01 LAB — TROPONIN I: Troponin I: 0.39 ng/mL (ref <0.30)

## 2013-05-01 MED ORDER — CARVEDILOL 3.125 MG PO TABS
3.1250 mg | ORAL_TABLET | Freq: Two times a day (BID) | ORAL | Status: DC
  Administered 2013-05-01 – 2013-05-07 (×12): 3.125 mg via ORAL
  Filled 2013-05-01 (×15): qty 1

## 2013-05-01 MED ORDER — ASPIRIN EC 81 MG PO TBEC
162.0000 mg | DELAYED_RELEASE_TABLET | Freq: Every day | ORAL | Status: DC
  Administered 2013-05-01 – 2013-05-07 (×7): 162 mg via ORAL
  Filled 2013-05-01 (×7): qty 2

## 2013-05-01 MED ORDER — MORPHINE SULFATE 2 MG/ML IJ SOLN
1.0000 mg | Freq: Once | INTRAMUSCULAR | Status: AC
  Administered 2013-05-01: 03:00:00 1 mg via INTRAVENOUS
  Filled 2013-05-01: qty 1

## 2013-05-01 MED ORDER — DEXTROSE 5 % IV SOLN
1.0000 g | Freq: Three times a day (TID) | INTRAVENOUS | Status: DC
  Administered 2013-05-01 – 2013-05-03 (×6): 1 g via INTRAVENOUS
  Filled 2013-05-01 (×8): qty 1

## 2013-05-01 MED ORDER — SIMVASTATIN 20 MG PO TABS
20.0000 mg | ORAL_TABLET | Freq: Every day | ORAL | Status: DC
  Administered 2013-05-01: 20 mg via ORAL
  Filled 2013-05-01 (×2): qty 1

## 2013-05-01 MED ORDER — LEVALBUTEROL HCL 0.63 MG/3ML IN NEBU
0.6300 mg | INHALATION_SOLUTION | RESPIRATORY_TRACT | Status: DC
  Administered 2013-05-01 – 2013-05-02 (×11): 0.63 mg via RESPIRATORY_TRACT
  Filled 2013-05-01 (×25): qty 3

## 2013-05-01 MED ORDER — METOPROLOL TARTRATE 12.5 MG HALF TABLET
12.5000 mg | ORAL_TABLET | Freq: Two times a day (BID) | ORAL | Status: DC
  Administered 2013-05-01: 12.5 mg via ORAL
  Filled 2013-05-01 (×2): qty 1

## 2013-05-01 MED ORDER — IPRATROPIUM BROMIDE 0.02 % IN SOLN
0.5000 mg | Freq: Four times a day (QID) | RESPIRATORY_TRACT | Status: DC
  Administered 2013-05-01 – 2013-05-02 (×8): 0.5 mg via RESPIRATORY_TRACT
  Filled 2013-05-01 (×10): qty 2.5

## 2013-05-01 MED ORDER — VANCOMYCIN HCL 500 MG IV SOLR
500.0000 mg | Freq: Two times a day (BID) | INTRAVENOUS | Status: DC
  Administered 2013-05-01 – 2013-05-02 (×4): 500 mg via INTRAVENOUS
  Filled 2013-05-01 (×6): qty 500

## 2013-05-01 NOTE — Progress Notes (Signed)
  Echocardiogram 2D Echocardiogram has been performed.  Luis Payne 05/01/2013, 11:19 AM

## 2013-05-01 NOTE — Progress Notes (Signed)
ANTIBIOTIC CONSULT NOTE - INITIAL  Pharmacy Consult for cefepime and vancomcyin Indication: HCAP  No Known Allergies  Patient Measurements: Height: 5\' 6"  (167.6 cm) Weight: 135 lb 2.3 oz (61.3 kg) IBW/kg (Calculated) : 63.8 Reported Body Weight: 54.5kg  Vital Signs: Temp: 98.5 F (36.9 C) (12/21 0459) Temp src: Oral (12/21 0459) BP: 106/55 mmHg (12/21 0459) Pulse Rate: 100 (12/21 0459)  Labs:  Recent Labs  04/29/13 2047 04/30/13 0340 04/30/13 2129 05/01/13 0241  WBC 9.3 7.2 4.4 4.2  HGB 8.9* 8.0* 11.3* 11.5*  PLT 125* 125* 82* 74*  CREATININE 1.08 1.04  --  0.98     Medical History: Past Medical History  Diagnosis Date  . Lung cancer   . Legionella pneumonia   . CHF (congestive heart failure)     Medications:  Scheduled:  . aspirin EC  162 mg Oral Daily  . carvedilol  3.125 mg Oral BID WC  . ceFEPime (MAXIPIME) IV  1 g Intravenous Q12H  . cholecalciferol  1,000 Units Oral Daily  . enoxaparin (LOVENOX) injection  40 mg Subcutaneous Q24H  . folic acid  1 mg Oral Daily  . ipratropium  0.5 mg Nebulization QID  . levalbuterol  0.63 mg Nebulization Q4H WA  . loratadine  10 mg Oral Daily  . multivitamin with minerals  1 tablet Oral Daily  . simvastatin  20 mg Oral q1800  . vancomycin  750 mg Intravenous Q24H   Infusions:  . sodium chloride 50 mL/hr at 05/01/13 1130   Assessment: 72 yoM admitted 12/19 with suspected pneumonia. Hx of metastatic lung CA with worsening SOB for past day. On Gemzar for chemo as outpatient. Pharmacy has been consulted to dose cefepime and vancomycin   Total Antibiotic Days: #3 12/19 >> cefepime >> 12/19 >> vancomcyin >>   Tmax: AF WBCs: Improved to WNL Renal: SCr 0.98, CrCl 57 (CG), 67 (N)  12/19 blood: NGTD 12/20 sputum: collected  12/19 MRSA PCR negative Influenza negative   Goal of Therapy:  Vancomycin trough level 15-20 mcg/ml eradication of infection  Plan:  - Increase to cefepime 1 gram IV q 8 hours -  Increase to vancomycin 500mg  IV q 12 hours - vancomycin trough at steady state if indicated - follow-up clinical course, culture results, renal function - follow-up antibiotic de-escalation and length of therapy  Thank you for the consult.  Haynes Hoehn, PharmD, BCPS 05/01/2013, 11:35 AM  Pager: [phone]

## 2013-05-01 NOTE — Progress Notes (Signed)
CRITICAL VALUE ALERT  Critical value received:  Troponin 0.41  Date of notification:  05/01/13  Time of notification:  0318   Critical value read back:yes  Nurse who received alert:  Mignon Pine, RN  MD notified (1st page):  Lenny Pastel, NP  Time of first page:  0320  MD notified (2nd page):  Time of second page:  Responding MD:  Lenny Pastel, NP  Time MD responded:  [phone]

## 2013-05-01 NOTE — Progress Notes (Signed)
Patient Name: Luis Payne Date of Encounter: 05/01/2013     Principal Problem:   Acute-on-chronic respiratory failure Active Problems:   HCAP (healthcare-associated pneumonia)   Lung cancer   Brain metastasis   Anemia, chronic disease   Sepsis    SUBJECTIVE  The patient is still dyspneic.  His chest pain has decreased significantly.  He states that he has trouble whenever his chemotherapy regimen is changed.  He started on a new regimen last week.  CURRENT MEDS . aspirin EC  162 mg Oral Daily  . ceFEPime (MAXIPIME) IV  1 g Intravenous Q12H  . cholecalciferol  1,000 Units Oral Daily  . enoxaparin (LOVENOX) injection  40 mg Subcutaneous Q24H  . folic acid  1 mg Oral Daily  . ipratropium  0.5 mg Nebulization QID  . levalbuterol  0.63 mg Nebulization Q4H WA  . loratadine  10 mg Oral Daily  . metoprolol tartrate  12.5 mg Oral BID  . multivitamin with minerals  1 tablet Oral Daily  . simvastatin  20 mg Oral q1800  . vancomycin  750 mg Intravenous Q24H    OBJECTIVE  Filed Vitals:   04/30/13 2022 04/30/13 2036 05/01/13 0209 05/01/13 0459  BP: 123/51   106/55  Pulse: 107   100  Temp: 98.6 F (37 C)   98.5 F (36.9 C)  TempSrc: Oral   Oral  Resp: 22   22  Height:      Weight:      SpO2: 92% 95% 92% 91%    Intake/Output Summary (Last 24 hours) at 05/01/13 0805 Last data filed at 05/01/13 0600  Gross per 24 hour  Intake 2873.5 ml  Output   2325 ml  Net  548.5 ml   Filed Weights   04/29/13 2000  Weight: 135 lb 2.3 oz (61.3 kg)    PHYSICAL EXAM  General: Pleasant, NAD.  Nasal oxygen in place Neuro: Alert and oriented X 3. Moves all extremities spontaneously. Psych: Normal affect. HEENT:  Normal  Neck: Supple without bruits or JVD. Lungs:  Bronchial breath sounds at right apex.  Decreased breath sounds on the left. Heart: RRR no s3, s4, or murmurs. Abdomen: Soft, non-tender, non-distended, BS + x 4.  Extremities: No clubbing, cyanosis or edema.  DP/PT/Radials 2+ and equal bilaterally.  Accessory Clinical Findings  CBC  Recent Labs  04/29/13 1130  04/30/13 2129 05/01/13 0241  WBC 12.1*  < > 4.4 4.2  NEUTROABS 11.8*  --   --   --   HGB 9.4*  < > 11.3* 11.5*  HCT 27.8*  < > 32.5* 33.1*  MCV 100.0  < > 93.9 94.0  PLT 146*  < > 82* 74*  < > = values in this interval not displayed. Basic Metabolic Panel  Recent Labs  04/30/13 0340 05/01/13 0241  NA 132* 133*  K 4.2 4.1  CL 96 99  CO2 29 29  GLUCOSE 95 114*  BUN 25* 24*  CREATININE 1.04 0.98  CALCIUM 8.6 8.9   Liver Function Tests No results found for this basename: AST, ALT, ALKPHOS, BILITOT, PROT, ALBUMIN,  in the last 72 hours No results found for this basename: LIPASE, AMYLASE,  in the last 72 hours Cardiac Enzymes  Recent Labs  04/29/13 1130 04/30/13 2129 05/01/13 0241  TROPONINI <0.30 <0.30 0.41*   BNP No components found with this basename: POCBNP,  D-Dimer No results found for this basename: DDIMER,  in the last 72 hours Hemoglobin A1C No results  found for this basename: HGBA1C,  in the last 72 hours Fasting Lipid Panel No results found for this basename: CHOL, HDL, LDLCALC, TRIG, CHOLHDL, LDLDIRECT,  in the last 72 hours Thyroid Function Tests No results found for this basename: TSH, T4TOTAL, FREET3, T3FREE, THYROIDAB,  in the last 72 hours  TELE  Sinus tachycardia  ECG  Sinus tachycardia Nonspecific ST abnormality Abnormal ECG  Radiology/Studies  [place] Chest  04/29/2013   CLINICAL DATA:  Patient with history of metastatic non-small cell lung carcinoma, dyspnea, recurrent left pleural effusion. Request is made for therapeutic left thoracentesis.  EXAM: CHEST ULTRASOUND  COMPARISON:  Previous thoracentesis on 04/27/2013  FINDINGS: On limited ultrasound of the left posterior chest region today there is minimal free pleural fluid noted. The collection is extensively multi-septated size multiloculated. . Due to these findings therapeutic  thoracentesis was not performed at this time.  IMPRESSION: Limited ultrasound left posterior chest today reveals extensive multiloculated/multi-septated collection in pleural space. Minimal free fluid noted. Thoracentesis was not performed at this time. Recommend consultation with thoracic surgery for further evaluation.  Read by: Jeananne Rama ,P.A.-C.   Electronically Signed   By: Irish Lack M.D.   On: 04/29/2013 15:04   Dg Chest Portable 1 View  04/29/2013   CLINICAL DATA:  Shortness of breath.  EXAM: PORTABLE CHEST - 1 VIEW  COMPARISON:  None.  FINDINGS: Opacity obscures the left heart border and left hemidiaphragm. There is coarse irregular opacity in the left upper lobe extending to the apex. Irregularly thickened bronchovascular/ interstitial markings are noted throughout the aerated left lung and in the right lung base with milder peripheral for interstitial scarring in the right upper lobe.  No pneumothorax. The cardiac silhouette is normal in overall size. The mediastinum is normal in contour.  IMPRESSION: Left lung base opacity is likely combination pleural fluid with either atelectasis, infiltrate or a combination.  Left upper lobe irregular lung opacity is most suggestive of scarring although infiltrate is also possible.  No convincing acute findings in the right lung. There is evidence of COPD.   Electronically Signed   By: Amie Portland M.D.   On: 04/29/2013 12:14    ASSESSMENT AND PLAN 1. Chest pain with abnormal troponin  2. Metastatic Lung Cancer  3. Malignant Pleural Effusion  4. Back pain s/p recent left thoracentesis  3. Health Care Associated Pneumonia  He has chest pain with some typical and atypical features. His abnormal troponin-I could be secondary to his pneumonia or injuries from recent thoracentesis; however, given his age and prior history of tobacco abuse, cannot exclude the possibility of obstructive CAD. Given his co-morbidities, medical therapy is recommended at  this time with ASA 81 mg qd, low-dose beta blockers, statins and prn Morphine/Nitrates if there is recurrent chest pain.  We'll obtain a TTE to evaluate his RV/LV function, serial cardiac markers and continued observation on telemetry.    Signed, Cassell Clement  MD

## 2013-05-01 NOTE — Progress Notes (Signed)
Went in patient's room to check oxygen saturation and it was 88% on 5 L/M [place]. Pt. States he uses 5 L/M at home. Increased oxygen to 6 L/M and sats improved to 91%. Will continue to spot check sats prn.

## 2013-05-01 NOTE — Progress Notes (Signed)
Pt called out stating that he was unable to breathe. On assessment patient respirations were 26 and oxygen level was 89% on 4L of Oxygen. Page midlevel and respiratory for treatment. Will continue to monitor. Will page Mid-level is patient does not improve.

## 2013-05-01 NOTE — Consult Note (Signed)
Reason for Consult:Chest pain with abnormal troponin  Referring Physician: Dr. Everett Graff  Primary Cardiologist: Dr. Elease Hashimoto  Patient is DNR/DNI  Luis Payne is an 72 y.o. male.   HPI: 72 y/o male currently seen in consultation with Dr. Claiborne Billings for evaluation of chest pain and abnormal troponin. He has a past medical history of Metastatic lung cancer s/p chemo/radiation, malignant pleural effusion s/p left thoracentesis 04/27/2013, health care associated pneumonia (currently on Vanc/Cefepime) and acute on chronic respiratory failure.  He started to complain of intermittent chest and back discomfort.  His chest discomfort is described as pressure, 7/10 in severity, occurs at rest, last a few minute before spontaneous relief. His pain is made worse with taking a deep breath and coughing.  He denies nausea/vomiting, diaphoresis, palpitation or syncope.  He has chronic shortness of breath from lung cancer and pneumonia, but his shortness of breath is not worse when he has chest pain.  His back pain mainly occurs with coughing and with deep breath.  It occurs around the area he was stuck for thoracentesis.  As part of his evaluation, he had EKG obtained 04/30/2013 at 20:30 which showed sinus rhythm with heart rate of 108 bpm and non-specific T-wave abnormalities.  His first set of cardiac marker showed a negative troponin-I, but his second set of troponin-I was abnormal at 0.41.  He received Morphine prior to my arrival, and he is currently asymptomatic and is hemodynamically stable.  He does have a 40 pack-year of smoking, but has no prior history of documented CAD.   Past Medical History  Diagnosis Date  . Lung cancer   . Legionella pneumonia   . CHF (congestive heart failure)     Past Surgical History  Procedure Laterality Date  . Ganglion cyst excision    . Nose surgery      History reviewed. No pertinent family history.  Social History:  reports that he quit smoking about 7 months  ago. His smoking use included Cigarettes. He smoked 0.00 packs per day for 60 years. He has never used smokeless tobacco. He reports that he does not drink alcohol or use illicit drugs.  Allergies: No Known Allergies  Medications: . aspirin EC  162 mg Oral Daily  . ceFEPime (MAXIPIME) IV  1 g Intravenous Q12H  . cholecalciferol  1,000 Units Oral Daily  . enoxaparin (LOVENOX) injection  40 mg Subcutaneous Q24H  . folic acid  1 mg Oral Daily  . ipratropium  0.5 mg Nebulization QID  . levalbuterol  0.63 mg Nebulization Q4H WA  . loratadine  10 mg Oral Daily  . metoprolol tartrate  12.5 mg Oral BID  . multivitamin with minerals  1 tablet Oral Daily  . simvastatin  20 mg Oral q1800  . vancomycin  750 mg Intravenous Q24H    Results for orders placed during the hospital encounter of 04/29/13 (from the past 48 hour(s))  BASIC METABOLIC PANEL     Status: Abnormal   Collection Time    04/29/13 11:30 AM      Result Value Range   Sodium 134 (*) 135 - 145 mEq/L   Potassium 4.1  3.5 - 5.1 mEq/L   Chloride 96  96 - 112 mEq/L   CO2 29  19 - 32 mEq/L   Glucose, Bld 147 (*) 70 - 99 mg/dL   BUN 33 (*) 6 - 23 mg/dL   Creatinine, Ser 4.09  0.50 - 1.35 mg/dL   Calcium 9.3  8.4 - 81.1  mg/dL   GFR calc non Af Amer 54 (*) >90 mL/min   GFR calc Af Amer 63 (*) >90 mL/min   Comment: (NOTE)     The eGFR has been calculated using the CKD EPI equation.     This calculation has not been validated in all clinical situations.     eGFR's persistently <90 mL/min signify possible Chronic Kidney     Disease.  CBC WITH DIFFERENTIAL     Status: Abnormal   Collection Time    04/29/13 11:30 AM      Result Value Range   WBC 12.1 (*) 4.0 - 10.5 K/uL   RBC 2.78 (*) 4.22 - 5.81 MIL/uL   Hemoglobin 9.4 (*) 13.0 - 17.0 g/dL   HCT 21.3 (*) 08.6 - 57.8 %   MCV 100.0  78.0 - 100.0 fL   MCH 33.8  26.0 - 34.0 pg   MCHC 33.8  30.0 - 36.0 g/dL   RDW 46.9  62.9 - 52.8 %   Platelets 146 (*) 150 - 400 K/uL   Neutrophils  Relative % 98 (*) 43 - 77 %   Neutro Abs 11.8 (*) 1.7 - 7.7 K/uL   Lymphocytes Relative 2 (*) 12 - 46 %   Lymphs Abs 0.3 (*) 0.7 - 4.0 K/uL   Monocytes Relative 0 (*) 3 - 12 %   Monocytes Absolute 0.0 (*) 0.1 - 1.0 K/uL   Eosinophils Relative 0  0 - 5 %   Eosinophils Absolute 0.0  0.0 - 0.7 K/uL   Basophils Relative 0  0 - 1 %   Basophils Absolute 0.0  0.0 - 0.1 K/uL  TROPONIN I     Status: None   Collection Time    04/29/13 11:30 AM      Result Value Range   Troponin I <0.30  <0.30 ng/mL   Comment:            Due to the release kinetics of cTnI,     a negative result within the first hours     of the onset of symptoms does not rule out     myocardial infarction with certainty.     If myocardial infarction is still suspected,     repeat the test at appropriate intervals.  TYPE AND SCREEN     Status: None   Collection Time    04/29/13  4:38 PM      Result Value Range   ABO/RH(D) A POS     Antibody Screen NEG     Sample Expiration 05/02/2013     Unit Number [id number]     Blood Component Type RED CELLS,LR     Unit division 00     Status of Unit ISSUED     Transfusion Status OK TO TRANSFUSE     Crossmatch Result Compatible     Unit Number [id number]     Blood Component Type RED CELLS,LR     Unit division 00     Status of Unit ISSUED     Transfusion Status OK TO TRANSFUSE     Crossmatch Result Compatible     Unit Number [id number]     Blood Component Type RED CELLS,LR     Unit division 00     Status of Unit ALLOCATED     Transfusion Status OK TO TRANSFUSE     Crossmatch Result Compatible    DIGOXIN LEVEL     Status: None   Collection Time    04/29/13  4:38  PM      Result Value Range   Digoxin Level 0.9  0.8 - 2.0 ng/mL  ABO/RH     Status: None   Collection Time    04/29/13  4:38 PM      Result Value Range   ABO/RH(D) A POS    URINALYSIS, ROUTINE W REFLEX MICROSCOPIC     Status: None   Collection Time    04/29/13  6:11 PM      Result Value Range    Color, Urine YELLOW  YELLOW   APPearance CLEAR  CLEAR   Specific Gravity, Urine 1.015  1.005 - 1.030   pH 6.0  5.0 - 8.0   Glucose, UA NEGATIVE  NEGATIVE mg/dL   Hgb urine dipstick NEGATIVE  NEGATIVE   Bilirubin Urine NEGATIVE  NEGATIVE   Ketones, ur NEGATIVE  NEGATIVE mg/dL   Protein, ur NEGATIVE  NEGATIVE mg/dL   Urobilinogen, UA 0.2  0.0 - 1.0 mg/dL   Nitrite NEGATIVE  NEGATIVE   Leukocytes, UA NEGATIVE  NEGATIVE   Comment: MICROSCOPIC NOT DONE ON URINES WITH NEGATIVE PROTEIN, BLOOD, LEUKOCYTES, NITRITE, OR GLUCOSE <1000 mg/dL.  MRSA PCR SCREENING     Status: None   Collection Time    04/29/13  7:51 PM      Result Value Range   MRSA by PCR NEGATIVE  NEGATIVE   Comment:            The GeneXpert MRSA Assay (FDA     approved for NASAL specimens     only), is one component of a     comprehensive MRSA colonization     surveillance program. It is not     intended to diagnose MRSA     infection nor to guide or     monitor treatment for     MRSA infections.  CBC     Status: Abnormal   Collection Time    04/29/13  8:47 PM      Result Value Range   WBC 9.3  4.0 - 10.5 K/uL   RBC 2.66 (*) 4.22 - 5.81 MIL/uL   Hemoglobin 8.9 (*) 13.0 - 17.0 g/dL   HCT 19.1 (*) 47.8 - 29.5 %   MCV 100.0  78.0 - 100.0 fL   MCH 33.5  26.0 - 34.0 pg   MCHC 33.5  30.0 - 36.0 g/dL   RDW 62.1  30.8 - 65.7 %   Platelets 125 (*) 150 - 400 K/uL  CREATININE, SERUM     Status: Abnormal   Collection Time    04/29/13  8:47 PM      Result Value Range   Creatinine, Ser 1.08  0.50 - 1.35 mg/dL   GFR calc non Af Amer 66 (*) >90 mL/min   GFR calc Af Amer 76 (*) >90 mL/min   Comment: (NOTE)     The eGFR has been calculated using the CKD EPI equation.     This calculation has not been validated in all clinical situations.     eGFR's persistently <90 mL/min signify possible Chronic Kidney     Disease.  INFLUENZA PANEL BY PCR     Status: None   Collection Time    04/29/13 11:10 PM      Result Value Range    Influenza A By PCR NEGATIVE  NEGATIVE   Influenza B By PCR NEGATIVE  NEGATIVE   H1N1 flu by pcr NOT DETECTED  NOT DETECTED   Comment:  The Xpert Flu assay (FDA approved for     nasal aspirates or washes and     nasopharyngeal swab specimens), is     intended as an aid in the diagnosis of     influenza and should not be used as     a sole basis for treatment.     Performed at [facility]  CULTURE, EXPECTORATED SPUTUM-ASSESSMENT     Status: None   Collection Time    04/30/13  2:18 AM      Result Value Range   Specimen Description SPU     Special Requests NONE     Sputum evaluation       Value: THIS SPECIMEN IS ACCEPTABLE. RESPIRATORY CULTURE REPORT TO FOLLOW.   Report Status 04/30/2013 FINAL    STREP PNEUMONIAE URINARY ANTIGEN     Status: None   Collection Time    04/30/13  2:19 AM      Result Value Range   Strep Pneumo Urinary Antigen NEGATIVE  NEGATIVE   Comment: PERFORMED AT [facility]                Infection due to S. pneumoniae     cannot be absolutely ruled out     since the antigen present     may be below the detection limit     of the test.     Performed at [facility]  BASIC METABOLIC PANEL     Status: Abnormal   Collection Time    04/30/13  3:40 AM      Result Value Range   Sodium 132 (*) 135 - 145 mEq/L   Potassium 4.2  3.5 - 5.1 mEq/L   Chloride 96  96 - 112 mEq/L   CO2 29  19 - 32 mEq/L   Glucose, Bld 95  70 - 99 mg/dL   BUN 25 (*) 6 - 23 mg/dL   Creatinine, Ser 9.60  0.50 - 1.35 mg/dL   Calcium 8.6  8.4 - 45.4 mg/dL   GFR calc non Af Amer 69 (*) >90 mL/min   GFR calc Af Amer 80 (*) >90 mL/min   Comment: (NOTE)     The eGFR has been calculated using the CKD EPI equation.     This calculation has not been validated in all clinical situations.     eGFR's persistently <90 mL/min signify possible Chronic Kidney     Disease.  CBC     Status: Abnormal   Collection Time    04/30/13  3:40 AM      Result Value Range   WBC  7.2  4.0 - 10.5 K/uL   RBC 2.36 (*) 4.22 - 5.81 MIL/uL   Hemoglobin 8.0 (*) 13.0 - 17.0 g/dL   HCT 09.8 (*) 11.9 - 14.7 %   MCV 100.0  78.0 - 100.0 fL   MCH 33.9  26.0 - 34.0 pg   MCHC 33.9  30.0 - 36.0 g/dL   RDW 82.9  56.2 - 13.0 %   Platelets 125 (*) 150 - 400 K/uL  PREPARE RBC (CROSSMATCH)     Status: None   Collection Time    04/30/13  8:00 AM      Result Value Range   Order Confirmation ORDER PROCESSED BY BLOOD BANK    TROPONIN I     Status: None   Collection Time    04/30/13  9:29 PM      Result Value Range   Troponin I <  0.30  <0.30 ng/mL   Comment:            Due to the release kinetics of cTnI,     a negative result within the first hours     of the onset of symptoms does not rule out     myocardial infarction with certainty.     If myocardial infarction is still suspected,     repeat the test at appropriate intervals.  CBC     Status: Abnormal   Collection Time    04/30/13  9:29 PM      Result Value Range   WBC 4.4  4.0 - 10.5 K/uL   RBC 3.46 (*) 4.22 - 5.81 MIL/uL   Hemoglobin 11.3 (*) 13.0 - 17.0 g/dL   Comment: POST TRANSFUSION SPECIMEN     DELTA CHECK NOTED   HCT 32.5 (*) 39.0 - 52.0 %   MCV 93.9  78.0 - 100.0 fL   MCH 32.7  26.0 - 34.0 pg   MCHC 34.8  30.0 - 36.0 g/dL   RDW 19.1 (*) 47.8 - 29.5 %   Platelets 82 (*) 150 - 400 K/uL   Comment: SPECIMEN CHECKED FOR CLOTS     PLATELET COUNT CONFIRMED BY SMEAR     DELTA CHECK NOTED  BASIC METABOLIC PANEL     Status: Abnormal   Collection Time    05/01/13  2:41 AM      Result Value Range   Sodium 133 (*) 135 - 145 mEq/L   Potassium 4.1  3.5 - 5.1 mEq/L   Chloride 99  96 - 112 mEq/L   CO2 29  19 - 32 mEq/L   Glucose, Bld 114 (*) 70 - 99 mg/dL   BUN 24 (*) 6 - 23 mg/dL   Creatinine, Ser 6.21  0.50 - 1.35 mg/dL   Calcium 8.9  8.4 - 30.8 mg/dL   GFR calc non Af Amer 79 (*) >90 mL/min   GFR calc Af Amer >90  >90 mL/min   Comment: (NOTE)     The eGFR has been calculated using the CKD EPI equation.      This calculation has not been validated in all clinical situations.     eGFR's persistently <90 mL/min signify possible Chronic Kidney     Disease.  CBC     Status: Abnormal   Collection Time    05/01/13  2:41 AM      Result Value Range   WBC 4.2  4.0 - 10.5 K/uL   RBC 3.52 (*) 4.22 - 5.81 MIL/uL   Hemoglobin 11.5 (*) 13.0 - 17.0 g/dL   HCT 65.7 (*) 84.6 - 96.2 %   MCV 94.0  78.0 - 100.0 fL   MCH 32.7  26.0 - 34.0 pg   MCHC 34.7  30.0 - 36.0 g/dL   RDW 95.2 (*) 84.1 - 32.4 %   Platelets 74 (*) 150 - 400 K/uL   Comment: CONSISTENT WITH PREVIOUS RESULT  TROPONIN I     Status: Abnormal   Collection Time    05/01/13  2:41 AM      Result Value Range   Troponin I 0.41 (*) <0.30 ng/mL   Comment:            Due to the release kinetics of cTnI,     a negative result within the first hours     of the onset of symptoms does not rule out     myocardial infarction with certainty.  If myocardial infarction is still suspected,     repeat the test at appropriate intervals.     CRITICAL RESULT CALLED TO, READ BACK BY AND VERIFIED WITH:     SPOKE WITH BURNS,J RN [phone] HAMER,N     [place] Chest  04/29/2013   CLINICAL DATA:  Patient with history of metastatic non-small cell lung carcinoma, dyspnea, recurrent left pleural effusion. Request is made for therapeutic left thoracentesis.  EXAM: CHEST ULTRASOUND  COMPARISON:  Previous thoracentesis on 04/27/2013  FINDINGS: On limited ultrasound of the left posterior chest region today there is minimal free pleural fluid noted. The collection is extensively multi-septated size multiloculated. . Due to these findings therapeutic thoracentesis was not performed at this time.  IMPRESSION: Limited ultrasound left posterior chest today reveals extensive multiloculated/multi-septated collection in pleural space. Minimal free fluid noted. Thoracentesis was not performed at this time. Recommend consultation with thoracic surgery for further evaluation.  Read by:  Jeananne Rama ,P.A.-C.   Electronically Signed   By: Irish Lack M.D.   On: 04/29/2013 15:04   Dg Chest Portable 1 View  04/29/2013   CLINICAL DATA:  Shortness of breath.  EXAM: PORTABLE CHEST - 1 VIEW  COMPARISON:  None.  FINDINGS: Opacity obscures the left heart border and left hemidiaphragm. There is coarse irregular opacity in the left upper lobe extending to the apex. Irregularly thickened bronchovascular/ interstitial markings are noted throughout the aerated left lung and in the right lung base with milder peripheral for interstitial scarring in the right upper lobe.  No pneumothorax. The cardiac silhouette is normal in overall size. The mediastinum is normal in contour.  IMPRESSION: Left lung base opacity is likely combination pleural fluid with either atelectasis, infiltrate or a combination.  Left upper lobe irregular lung opacity is most suggestive of scarring although infiltrate is also possible.  No convincing acute findings in the right lung. There is evidence of COPD.   Electronically Signed   By: Amie Portland M.D.   On: 04/29/2013 12:14    Review of Systems  Constitutional: Negative for fever and diaphoresis.  HENT: Negative for nosebleeds and tinnitus.   Eyes: Negative for blurred vision and pain.  Respiratory: Positive for cough and shortness of breath. Negative for hemoptysis and sputum production.   Cardiovascular: Positive for chest pain. Negative for palpitations, orthopnea, claudication and leg swelling.  Gastrointestinal: Negative for heartburn, nausea, vomiting, abdominal pain, diarrhea and constipation.  Genitourinary: Negative for dysuria and frequency.  Musculoskeletal: Positive for back pain. Negative for myalgias.  Skin: Negative for itching and rash.  Neurological: Negative for dizziness, tremors, speech change and headaches.  Psychiatric/Behavioral: Negative for hallucinations.   Blood pressure 106/55, pulse 100, temperature 98.5 F (36.9 C), temperature  source Oral, resp. rate 22, height 5\' 6"  (1.676 m), weight 61.3 kg (135 lb 2.3 oz), SpO2 91.00%. Physical Exam  Constitutional: He is oriented to person, place, and time. He appears well-developed and well-nourished. No distress.  HENT:  Head: Normocephalic and atraumatic.  Eyes: Right eye exhibits no discharge. Left eye exhibits no discharge.  Neck: Normal range of motion. Neck supple. No JVD present.  Cardiovascular: Normal rate and regular rhythm.  Exam reveals no friction rub.   No murmur heard. Respiratory: No stridor. He has no wheezes. He has no rales.  Decreased breath sounds left lung.  GI: He exhibits no distension. There is no tenderness. There is no rebound and no guarding.  Musculoskeletal: Normal range of motion. He exhibits no edema and  no tenderness.  Neurological: He is alert and oriented to person, place, and time.  Skin: No rash noted. He is not diaphoretic. No erythema.  Psychiatric: He has a normal mood and affect.    Assessment/Plan:  1.  Chest pain with abnormal troponin 2.  Metastatic Lung Cancer 3.  Malignant Pleural Effusion 4.  Back pain s/p recent left thoracentesis 3.  Health Care Associated Pneumonia  He has chest pain with some typical and atypical features. His abnormal troponin-I could be secondary to his pneumonia or injuries from recent thoracentesis; however, given his age and prior history of tobacco abuse, cannot exclude the possibility of obstructive CAD. Given his co-morbidities, medical therapy is recommended at this time with ASA 81 mg qd, low-dose beta blockers, statins and prn Morphine/Nitrates if there is recurrent chest pain.  May obtain a TTE to evaluate his RV/LV function, serial cardiac markers and continued observation on telemetry.   Kamla Skilton E 05/01/2013, 5:16 AM

## 2013-05-01 NOTE — Progress Notes (Signed)
[facility] PROGRESS NOTE  Luis Payne ZOX:[id number] DOB: 1940-05-17 DOA: 04/29/2013 PCP: No primary provider on file.  Assessment/Plan: Acute on Chronic Respiratory Failure -Suspect HCAP on top of baseline metastatic lung cancer and malignant pleural effusions. -Seems clinically improved today on  oxygen. -Not sufficient pleural fluid for thoracentesis. -Recheck CXR in am.  Sepsis -2/2 HCAP. -Septic parameters improved. -BP, HR better.  HCAP -Continue vanc/cefepime. -Cx data pending. -Influenza PCR negative: DC tamiflu, DC droplet precautions.  AOCD -Hb has further decreased to 8 today. -Given his h/o CAD/CHF and the fact that the anemia could be contributing to his SOB, will transfuse 2 units of PRBCs today.  Chronic Systolic CHF -Compensated. -Restart coreg today as BP improved. -Had some chest discomfort overnight that I suspect is related to cough from his acute respiratory illness, second troponin was elevated. -Appreciate cards input. -2D ECHO is pending.   Code Status: DNR Family Communication: Discussed with wife at bedside.  Disposition Plan: Home once medically stable.   Consultants:  None   Antibiotics:  Vancomycin 04/29/13-->  Cefepime 04/29/13-->  Tamiflu 12/19-->04/30/13     Subjective: Sleeping. No WOB. Feels well.  Objective: Filed Vitals:   04/30/13 2036 05/01/13 0209 05/01/13 0459 05/01/13 0812  BP:   106/55   Pulse:   100   Temp:   98.5 F (36.9 C)   TempSrc:   Oral   Resp:   22   Height:      Weight:      SpO2: 95% 92% 91% 91%    Intake/Output Summary (Last 24 hours) at 05/01/13 1012 Last data filed at 05/01/13 0600  Gross per 24 hour  Intake 2673.5 ml  Output   2225 ml  Net  448.5 ml   Filed Weights   04/29/13 2000  Weight: 61.3 kg (135 lb 2.3 oz)    Exam:   General:  AA Ox3  Cardiovascular: RRR  Respiratory: decreased BS left base.  Abdomen: S/NT/ND/+BS  Extremities: no C/C/E/+pulses    Neurologic:  Non-focal.  Data Reviewed: Basic Metabolic Panel:  Recent Labs Lab 04/29/13 1130 04/29/13 2047 04/30/13 0340 05/01/13 0241  NA 134*  --  132* 133*  K 4.1  --  4.2 4.1  CL 96  --  96 99  CO2 29  --  29 29  GLUCOSE 147*  --  95 114*  BUN 33*  --  25* 24*  CREATININE 1.27 1.08 1.04 0.98  CALCIUM 9.3  --  8.6 8.9   Liver Function Tests: No results found for this basename: AST, ALT, ALKPHOS, BILITOT, PROT, ALBUMIN,  in the last 168 hours No results found for this basename: LIPASE, AMYLASE,  in the last 168 hours No results found for this basename: AMMONIA,  in the last 168 hours CBC:  Recent Labs Lab 04/29/13 1130 04/29/13 2047 04/30/13 0340 04/30/13 2129 05/01/13 0241  WBC 12.1* 9.3 7.2 4.4 4.2  NEUTROABS 11.8*  --   --   --   --   HGB 9.4* 8.9* 8.0* 11.3* 11.5*  HCT 27.8* 26.6* 23.6* 32.5* 33.1*  MCV 100.0 100.0 100.0 93.9 94.0  PLT 146* 125* 125* 82* 74*   Cardiac Enzymes:  Recent Labs Lab 04/29/13 1130 04/30/13 2129 05/01/13 0241  TROPONINI <0.30 <0.30 0.41*   BNP (last 3 results) No results found for this basename: PROBNP,  in the last 8760 hours CBG: No results found for this basename: GLUCAP,  in the last 168 hours  Recent Results (from the  past 240 hour(s))  MRSA PCR SCREENING     Status: None   Collection Time    04/29/13  7:51 PM      Result Value Range Status   MRSA by PCR NEGATIVE  NEGATIVE Final   Comment:            The GeneXpert MRSA Assay (FDA     approved for NASAL specimens     only), is one component of a     comprehensive MRSA colonization     surveillance program. It is not     intended to diagnose MRSA     infection nor to guide or     monitor treatment for     MRSA infections.  CULTURE, EXPECTORATED SPUTUM-ASSESSMENT     Status: None   Collection Time    04/30/13  2:18 AM      Result Value Range Status   Specimen Description SPU   Final   Special Requests NONE   Final   Sputum evaluation     Final   Value:  THIS SPECIMEN IS ACCEPTABLE. RESPIRATORY CULTURE REPORT TO FOLLOW.   Report Status 04/30/2013 FINAL   Final     Studies: [place] Chest  04/29/2013   CLINICAL DATA:  Patient with history of metastatic non-small cell lung carcinoma, dyspnea, recurrent left pleural effusion. Request is made for therapeutic left thoracentesis.  EXAM: CHEST ULTRASOUND  COMPARISON:  Previous thoracentesis on 04/27/2013  FINDINGS: On limited ultrasound of the left posterior chest region today there is minimal free pleural fluid noted. The collection is extensively multi-septated size multiloculated. . Due to these findings therapeutic thoracentesis was not performed at this time.  IMPRESSION: Limited ultrasound left posterior chest today reveals extensive multiloculated/multi-septated collection in pleural space. Minimal free fluid noted. Thoracentesis was not performed at this time. Recommend consultation with thoracic surgery for further evaluation.  Read by: Jeananne Rama ,P.A.-C.   Electronically Signed   By: Irish Lack M.D.   On: 04/29/2013 15:04   Dg Chest Portable 1 View  04/29/2013   CLINICAL DATA:  Shortness of breath.  EXAM: PORTABLE CHEST - 1 VIEW  COMPARISON:  None.  FINDINGS: Opacity obscures the left heart border and left hemidiaphragm. There is coarse irregular opacity in the left upper lobe extending to the apex. Irregularly thickened bronchovascular/ interstitial markings are noted throughout the aerated left lung and in the right lung base with milder peripheral for interstitial scarring in the right upper lobe.  No pneumothorax. The cardiac silhouette is normal in overall size. The mediastinum is normal in contour.  IMPRESSION: Left lung base opacity is likely combination pleural fluid with either atelectasis, infiltrate or a combination.  Left upper lobe irregular lung opacity is most suggestive of scarring although infiltrate is also possible.  No convincing acute findings in the right lung. There is evidence  of COPD.   Electronically Signed   By: Amie Portland M.D.   On: 04/29/2013 12:14    Scheduled Meds: . aspirin EC  162 mg Oral Daily  . ceFEPime (MAXIPIME) IV  1 g Intravenous Q12H  . cholecalciferol  1,000 Units Oral Daily  . enoxaparin (LOVENOX) injection  40 mg Subcutaneous Q24H  . folic acid  1 mg Oral Daily  . ipratropium  0.5 mg Nebulization QID  . levalbuterol  0.63 mg Nebulization Q4H WA  . loratadine  10 mg Oral Daily  . metoprolol tartrate  12.5 mg Oral BID  . multivitamin with minerals  1 tablet  Oral Daily  . simvastatin  20 mg Oral q1800  . vancomycin  750 mg Intravenous Q24H   Continuous Infusions: . sodium chloride 50 mL/hr (04/30/13 0523)    Principal Problem:   Acute-on-chronic respiratory failure Active Problems:   HCAP (healthcare-associated pneumonia)   Lung cancer   Brain metastasis   Anemia, chronic disease   Sepsis    Time spent: 35 minutes. Greater than 50% of this time was spent in direct contact with the patient coordinating care.    Chaya Jan  [facility] Pager [phone]  If 7PM-7AM, please contact night-coverage at [email or web address], password [facility] 05/01/2013, 10:12 AM  LOS: 2 days

## 2013-05-02 ENCOUNTER — Ambulatory Visit: Payer: Medicare Other | Admitting: Nurse Practitioner

## 2013-05-02 ENCOUNTER — Encounter (HOSPITAL_COMMUNITY): Payer: Self-pay | Admitting: Radiology

## 2013-05-02 ENCOUNTER — Inpatient Hospital Stay (HOSPITAL_COMMUNITY): Payer: Medicare Other

## 2013-05-02 DIAGNOSIS — J90 Pleural effusion, not elsewhere classified: Secondary | ICD-10-CM

## 2013-05-02 LAB — CULTURE, RESPIRATORY W GRAM STAIN

## 2013-05-02 LAB — CBC
HCT: 33.5 % — ABNORMAL LOW (ref 39.0–52.0)
MCV: 95.7 fL (ref 78.0–100.0)
Platelets: 48 10*3/uL — ABNORMAL LOW (ref 150–400)
RBC: 3.5 MIL/uL — ABNORMAL LOW (ref 4.22–5.81)
WBC: 3.9 10*3/uL — ABNORMAL LOW (ref 4.0–10.5)

## 2013-05-02 LAB — CULTURE, RESPIRATORY

## 2013-05-02 LAB — BASIC METABOLIC PANEL
CO2: 26 mEq/L (ref 19–32)
Chloride: 96 mEq/L (ref 96–112)
Sodium: 133 mEq/L — ABNORMAL LOW (ref 135–145)

## 2013-05-02 MED ORDER — METHYLPREDNISOLONE SODIUM SUCC 40 MG IJ SOLR
40.0000 mg | Freq: Two times a day (BID) | INTRAMUSCULAR | Status: DC
  Administered 2013-05-02 – 2013-05-07 (×10): 40 mg via INTRAVENOUS
  Filled 2013-05-02 (×13): qty 1

## 2013-05-02 MED ORDER — ENOXAPARIN SODIUM 60 MG/0.6ML ~~LOC~~ SOLN
1.0000 mg/kg | Freq: Two times a day (BID) | SUBCUTANEOUS | Status: DC
  Administered 2013-05-02: 22:00:00 60 mg via SUBCUTANEOUS
  Filled 2013-05-02 (×3): qty 0.6

## 2013-05-02 MED ORDER — IOHEXOL 350 MG/ML SOLN
100.0000 mL | Freq: Once | INTRAVENOUS | Status: AC | PRN
  Administered 2013-05-02: 20:00:00 100 mL via INTRAVENOUS

## 2013-05-02 MED ORDER — ENOXAPARIN SODIUM 100 MG/ML ~~LOC~~ SOLN: 1.5000 mg/kg | SUBCUTANEOUS | Status: DC

## 2013-05-02 NOTE — Progress Notes (Signed)
Patient's Spo2 maintaining at 87-88% on 6 lpm nasal cannula.  Placed patient on 50% venturi mask.  Patient states he doesn't feel SOB.  No distress noted. Post venturi mask, Spo2 currently at 91%.

## 2013-05-02 NOTE — Progress Notes (Signed)
[facility]  [phone]  Cassell Smiles., MD  [address]  [place] [address]   DIAGNOSIS: Metastatic non-small cell lung cancer, adenocarcinoma with negative EGFR mutation and negative ALK gene translocation diagnosed in April 2014   PRIOR THERAPY:  1. Status post stereotactic radiotherapy to 2 brain lesions under the care of Dr. Kathrynn Running.  2. Status post palliative radiotherapy to the left lung mass.  3. Systemic chemotherapy with carboplatin for AUC of 5 and Alimta 500 mg/M2 every 3 weeks, status post 6 cycles. 4. Tarceva 150 mg by mouth daily. Started 02/25/2013. Status post approximately two month of therapy discontinued today secondary to disease progression.                                                                                                                             CURRENT THERAPY: Systemic chemotherapy with gemcitabine 1000 mg/M2 on days 1 and 8 every 3 weeks. First dose on 04/26/2013   CHEMOTHERAPY INTENT: Palliative  CURRENT # OF CHEMOTHERAPY CYCLES: 1  CURRENT ANTIEMETICS: Compazine  CURRENT SMOKING STATUS: Former smoker, quit 09/28/2012  ORAL CHEMOTHERAPY AND CONSENT: Tarceva and the patient signed consent today.  CURRENT BISPHOSPHONATES USE: none  PAIN MANAGEMENT: Percocet  NARCOTICS INDUCED CONSTIPATION: none  LIVING WILL AND CODE STATUS: No code Blue.   HPI: This is a very pleasant 72 years old white male with history of metastatic non-small cell lung cancer, adenocarcinoma who was  undergoing treatment with oral Tarceva 150 mg by mouth daily status post 2 months of treatment and tolerating it fairly well except for the grade 1-2 skin rash mainly on the face and nose. He was last seen on 04/21/2013 at our office. Unfortunately the recent CT scan of the chest, abdomen and pelvis showed evidence for disease progression especially in the left pleural based metastasis as well as enlarging left pleural  effusion. He was experiencing  significant dyspnea at the time. ultrasound-guided left thoracentesis done on 12/11 by interventional radiology with improvement of respiratory status. However, Tarceva was discontinued at this point because of the disease progression.  Other treatment options including palliative care versus treatment with single agent gemcitabine 1000 mg/M2 every 3 weeks were discussed versus single agent docetaxel although  unfortunately his condition and performance status is very poor to tolerate this treatment. He agreed to proceed with the treatment with gemcitabine 21 day cycles with gemcitabine to be administered on Days 1 and 8, scheduled for 04/26/2013. He receiver 1st dose Gemzar on 12/16. He had a second thoracentesis on 12/17 by IR,yielding 950 cc's dark, bloody fluid. He presented on 12/19 with progressive shortness of breath despite increased in home O2 to  5 liters  at home On arrival he was found to be hypotensive with blood pressures in the 80s over 40s. He has been afebrile, chest x-ray shows a left lung base opacity that is likely a combination of pleural fluid with atelectasis  infiltrate or combination. There is also a left upper lobe irregular opacity most adjustable scarring although infiltrate is also possible. He was sent to interventional radiology for another possible thoracenteses, however there was no free-flowing, layering effusion and as such that was aborted. His O2 sats were 80% on 5 L on arrival and have improved to 100% on a NRB per chart report.Currently is in 6 L [place], NAD.  In addition, he received 2 units of blood on 12/20 for a Hb of 8 with good response. He is DNRPlease note that this medical record number does not have any information however he has another chart with all his information. Medical record number is [id number].We were kindly informed of the patient's admission. He is still dyspneic. No chest pain.  Complains of clear cough, occasionally blood tinged  due to increased cough effort. Alert and conversant.   MEDICATIONS:  . aspirin EC  162 mg Oral Daily  . carvedilol  3.125 mg Oral BID WC  . ceFEPime (MAXIPIME) IV  1 g Intravenous Q8H  . cholecalciferol  1,000 Units Oral Daily  . enoxaparin (LOVENOX) injection  40 mg Subcutaneous Q24H  . folic acid  1 mg Oral Daily  . ipratropium  0.5 mg Nebulization QID  . levalbuterol  0.63 mg Nebulization Q4H WA  . loratadine  10 mg Oral Daily  . multivitamin with minerals  1 tablet Oral Daily  . vancomycin  500 mg Intravenous Q12H   acetaminophen, acetaminophen, levalbuterol, ondansetron (ZOFRAN) IV, ondansetron, senna-docusate   ALLERGIES:  No Known Allergies   PHYSICAL EXAMINATION:   Filed Vitals:   05/02/13 0452  BP: 125/57  Pulse: 99  Temp: 97.5 F (36.4 C)  Resp: 22   Filed Weights   04/29/13 2000 05/02/13 0452  Weight: 135 lb 2.3 oz (61.3 kg) 128 lb 4.9 oz (58.76 kg)    72 year old  in no acute distress A. and O. x3 General well-developed , chronically ill appearing, dyspneic,   HEENT: Normocephalic, atraumatic, PERRLA. Oral cavity without thrush or lesions. Neck supple. no thyromegaly, no cervical or supraclavicular adenopathy  Lungs decreased left breath sounds.  Cardiac: regular rate and rhythm,no murmur , rubs or gallops Abdomen soft nontender , bowel sounds x4. No HSM Extremities no clubbing cyanosis or edema. No bruising or petechial rash Neuro: non focal except for hard of hearing.  LABORATORY/RADIOLOGY DATA:   Recent Labs Lab 04/29/13 1130 04/29/13 2047 04/30/13 0340 04/30/13 2129 05/01/13 0241 05/02/13 0510  WBC 12.1* 9.3 7.2 4.4 4.2 3.9*  HGB 9.4* 8.9* 8.0* 11.3* 11.5* 11.6*  HCT 27.8* 26.6* 23.6* 32.5* 33.1* 33.5*  PLT 146* 125* 125* 82* 74* 48*  MCV 100.0 100.0 100.0 93.9 94.0 95.7  MCH 33.8 33.5 33.9 32.7 32.7 33.1  MCHC 33.8 33.5 33.9 34.8 34.7 34.6  RDW 14.1 14.2 14.2 16.0* 16.0* 15.2  LYMPHSABS 0.3*  --   --   --   --   --   MONOABS 0.0*  --    --   --   --   --   EOSABS 0.0  --   --   --   --   --   BASOSABS 0.0  --   --   --   --   --     CMP    Recent Labs Lab 04/29/13 1130 04/29/13 2047 04/30/13 0340 05/01/13 0241 05/02/13 0510  NA 134*  --  132* 133* 133*  K 4.1  --  4.2 4.1 4.0  CL  96  --  96 99 96  CO2 29  --  29 29 26   GLUCOSE 147*  --  95 114* 99  BUN 33*  --  25* 24* 22  CREATININE 1.27 1.08 1.04 0.98 1.02  CALCIUM 9.3  --  8.6 8.9 8.8       Urinalysis    Component Value Date/Time   COLORURINE YELLOW 04/29/2013 1811   APPEARANCEUR CLEAR 04/29/2013 1811   LABSPEC 1.015 04/29/2013 1811   PHURINE 6.0 04/29/2013 1811   GLUCOSEU NEGATIVE 04/29/2013 1811   HGBUR NEGATIVE 04/29/2013 1811   BILIRUBINUR NEGATIVE 04/29/2013 1811   KETONESUR NEGATIVE 04/29/2013 1811   PROTEINUR NEGATIVE 04/29/2013 1811   UROBILINOGEN 0.2 04/29/2013 1811   NITRITE NEGATIVE 04/29/2013 1811   LEUKOCYTESUR NEGATIVE 04/29/2013 1811     Cardiac Enzymes:  Recent Labs Lab 04/29/13 1130 04/30/13 2129 05/01/13 0241 05/01/13 0915  TROPONINI <0.30 <0.30 0.41* 0.39*     Radiology Studies:  [place] Chest  04/29/2013   CLINICAL DATA:  Patient with history of metastatic non-small cell lung carcinoma, dyspnea, recurrent left pleural effusion. Request is made for therapeutic left thoracentesis.  EXAM: CHEST ULTRASOUND  COMPARISON:  Previous thoracentesis on 04/27/2013  FINDINGS: On limited ultrasound of the left posterior chest region today there is minimal free pleural fluid noted. The collection is extensively multi-septated size multiloculated. . Due to these findings therapeutic thoracentesis was not performed at this time.  IMPRESSION: Limited ultrasound left posterior chest today reveals extensive multiloculated/multi-septated collection in pleural space. Minimal free fluid noted. Thoracentesis was not performed at this time. Recommend consultation with thoracic surgery for further evaluation.  Read by: Jeananne Rama ,P.A.-C.    Electronically Signed   By: Irish Lack M.D.   On: 04/29/2013 15:04   Dg Chest Port 1 View  05/02/2013   CLINICAL DATA:  Pneumonia  EXAM: PORTABLE CHEST - 1 VIEW  COMPARISON:  April 29, 2013  FINDINGS: There is new airspace consolidation in the right upper lobe. Extensive interstitial edema as well as airspace consolidation throughout much of the remainder the left lung is clear. There is interstitial edema on the right with areas of scarring, stable. Heart is enlarged with pulmonary vascularity reflecting underlying emphysema. No adenopathy appreciable.  IMPRESSION: Extensive consolidation on the left, increase, particularly in the left upper lobe. There appears to be underlying congestive heart failure superimposed on emphysema. The changes on the left are suspicious for a degree of pneumonia, particularly in the left upper lobe.   Electronically Signed   By: Bretta Bang M.D.   On: 05/02/2013 07:29   Dg Chest Portable 1 View  04/29/2013   CLINICAL DATA:  Shortness of breath.  EXAM: PORTABLE CHEST - 1 VIEW  COMPARISON:  None.  FINDINGS: Opacity obscures the left heart border and left hemidiaphragm. There is coarse irregular opacity in the left upper lobe extending to the apex. Irregularly thickened bronchovascular/ interstitial markings are noted throughout the aerated left lung and in the right lung base with milder peripheral for interstitial scarring in the right upper lobe.  No pneumothorax. The cardiac silhouette is normal in overall size. The mediastinum is normal in contour.  IMPRESSION: Left lung base opacity is likely combination pleural fluid with either atelectasis, infiltrate or a combination.  Left upper lobe irregular lung opacity is most suggestive of scarring although infiltrate is also possible.  No convincing acute findings in the right lung. There is evidence of COPD.   Electronically Signed   By: Onalee Hua  Ormond M.D.   On: 04/29/2013 12:14       ASSESSMENT AND PLAN:    This is a very pleasant 72 years old white male with history of metastatic non-small cell lung cancer, adenocarcinoma currently undergoing treatment with gemcitabine 1000 mg/M2 every 3 weeks were  with gemcitabine to be administered on Days 1 and 8 withfirst Cycle on  04/26/13. We have been kindly informed of the patient's admission, as he presented with increased dyspnea despite a recent repeat left thoracentesis on 12/17, hypoxia and chest pain evaluated by Cards with normal LVF. He was due for Gemzar on 12/23, which may have to be placed on hold due to his current status. He is DNR.  As for his anemia, will continue to monitor his counts. He has received to date 2 units f blood during this admission, with good response, in the setting of recent chemotherapy.  DVT prophylaxis: on Lovenox   Dr. Arbutus Ped to see patient later today and write an addendum to this note. Thank you for allowing [place] the opportunity to participate on this patients' care.    Marcos Eke, PA-C 05/02/2013, 10:16 AM  ADDENDUM:  Hematology/Oncology Attending:  The patient is seen and examined today. I agree with the above note. His wife does at the bedside. This is a very pleasant 72 years old white male with metastatic non-small cell lung cancer, adenocarcinoma with metastatic brain lesion status post stereotactic radiotherapy to 2 brain lesions in addition to palliative radiotherapy to the left lung mass followed by 6 cycles of systemic chemotherapy with carboplatin and Alimta discontinued secondary to disease progression. He was also treated with Tarceva for 2 months but unfortunately has evidence for disease progression. He was started on treatment with single agent gemcitabine status post 1 dose.  The patient was admitted with significant dyspnea and was found to have bilateral pleural effusions status post left-sided thoracentesis few times recently. His recent chest x-ray showed left lung base opacity likely a combination  of pleural fluid with either atelectasis, infiltrate or a combination there is also new left upper lobe irregular lung opacity suggestive of scarring with questionable infiltrate. The patient is currently undergoing treatment for questionable pneumonia with cefepime and vancomycin. There is worsening of the infiltrate in the left lung. This could be also secondary to gemcitabine radiation recall with severe pneumonitis. CT angiogram of the chest may helpful to rule out any other etiology. I would consider adding steroids to his treatment regimen with methylprednisolone 1 mg/kg on daily basis. The patient was seen by cardiology to rule out congestive heart failure. I also had a lengthy discussion with the patient and his wife about his CODE STATUS and he indicated no CODE BLUE. Thank you so much for taking good care of Mr. and, I will continue to follow the patient with you and assist in his management.

## 2013-05-02 NOTE — Care Management Note (Addendum)
    Page 1 of 2   05/06/2013     3:15:47 PM   CARE MANAGEMENT NOTE 05/06/2013  Patient:  Luis Payne, Luis Payne   Account Number:  [id number]  Date Initiated:  05/02/2013  Documentation initiated by:  Lanier Clam  Subjective/Objective Assessment:   72 Y/O M ADMITTED W/RESP FAILURE.ZO:XWRU CA.     Action/Plan:   FROM HOME W/SPOUSE.HOME 02-[facility] [phone].   Anticipated DC Date:  05/09/2013   Anticipated DC Plan:  HOME W HOME HEALTH SERVICES      DC Planning Services  CM consult      Choice offered to / List presented to:  C-1 Patient           Status of service:  In process, will continue to follow Medicare Important Message given?   (If response is "NO", the following Medicare IM given date fields will be blank) Date Medicare IM given:   Date Additional Medicare IM given:    Discharge Disposition:    Per UR Regulation:  Reviewed for med. necessity/level of care/duration of stay  If discussed at Long Length of Stay Meetings, dates discussed:    Comments:  05/06/13 Harshith Pursell RN,BSN NCM [phone] [facility] WILL HAVE HOME 02 DELIVERED TO HOME VIA UPS,SPOUSE AWARE.THEY WILL NOT USE [facility] FOR HOME 02.[facility] FOLLOWING IF HHC NEEDED.AWAIT HHC ORDERS.  05/04/13 Olegario Emberson RN,BSN NCM [phone] FAXED 02 SATS, & SCRIPT TO [facility] CINDY W/CONFIRMATION #[phone].EAV#[phone].PATIENT QUALIFIED FOR HOME 02 OF 6L PORTABLE,& 10L CONCENTRATOR.[facility] WILL DELIVER TO HOME ON FRIDAY,CINDY HAS ALREADY SPOKEN TO SPOUSE & INFORMED OF DELIVERY FRIDAY.[facility] HAS BEEN CHOSEN FOR HHC, FOLLOWING IF HHC IS NEEDED.THERE HAS BEEN NO MENTION OF HOSPICE SERVICES, IF NEEDED WOULD NEED NOTED THAT PATIENT QUALIFIES FOR HOSPICE SERVICES,THEN HOME HOSPICE CHOICE REFERRAL,BUT NOT SURE IF PATIENT IS READY FOR THAT LEVEL AT THIS POINT.  05/03/13 Niemah Schwebke RN,BSN NCM [phone] 02 SATS CHECKED @ REST ON 4LNC,SATS 97%,DOES NOT QUALIFY FOR INCREASE IN HOME 02 LEVEL.INFORMED CINDY ROGERS(TEL#[phone],SHE WILL BE  AVAILABLE TOMORROW TILL 6P CENTRAL TIME THEY ARE 1HR AHEAD OF [place].CURRENTLY HOME 02 PORTABLE TANK HOLDS UP TO 4L,& HOME CONCENTRATOR HOLDS UP TO 5L. TC [facility](HOME 02) [phone],SPOKE TO ANGIE(CUST SERVICE)INFORMED HER OF HOME 02 NEEDS OF POSSIBLE HIGHER LEVEL 13F HOME 02.FAXED W/CONFIRMATION WJX#[phone] H&P,FACE SHEET,PROGRESS NOTES.RECEIVED CALL FROM CINDY ROGERS(RESP THERAPIST)REQUESTING 02 SATS TO BE CHECKED ON 4L 02.IF 02 SATS BELOW 88%,& W/SCRIPT ON HOME 02 NEEDED THEY CAN DELIVER HOME 02 TO THE HOME BY FRIDAY.WILL FAX RESULTS TO CINDY ROGERS FAX#[phone].NWG#[phone].MD/NURSE NOTIFIED.ALSO INFORMED [facility] DME REP IF HOME 02 NEEDED @ D/C,THEY WOULD BE ABLE TO PROVIDE ALSO.[facility] HHC CHOSEN IF HHC NEEDED,TC [facility] REP INFORMED OF REFERRAL.  05/02/13 Meade Hogeland RN,BSN NCM [phone] RESP FAILURE.IV ABX,NEBS,INCENTIVE SPIROMETER.IF HHC NEEDED CAN ARRANGE.

## 2013-05-02 NOTE — Progress Notes (Addendum)
Patient Name: Luis Payne Date of Encounter: 05/02/2013     Principal Problem:   Acute-on-chronic respiratory failure Active Problems:   HCAP (healthcare-associated pneumonia)   Lung cancer   Brain metastasis   Anemia, chronic disease   Sepsis    SUBJECTIVE  72 yo with metastatic lung CA- admitted with CP and dyspnea.  Troponin levels are minimally elevated.    The patient is still dyspneic.  His chest pain has decreased significantly.  He states that he has trouble whenever his chemotherapy regimen is changed.  He started on a new regimen last week.  Past Medical History  Diagnosis Date  . Lung cancer   . Legionella pneumonia   . CHF (congestive heart failure)      CURRENT MEDS . aspirin EC  162 mg Oral Daily  . carvedilol  3.125 mg Oral BID WC  . ceFEPime (MAXIPIME) IV  1 g Intravenous Q8H  . cholecalciferol  1,000 Units Oral Daily  . enoxaparin (LOVENOX) injection  40 mg Subcutaneous Q24H  . folic acid  1 mg Oral Daily  . ipratropium  0.5 mg Nebulization QID  . levalbuterol  0.63 mg Nebulization Q4H WA  . loratadine  10 mg Oral Daily  . multivitamin with minerals  1 tablet Oral Daily  . simvastatin  20 mg Oral q1800  . vancomycin  500 mg Intravenous Q12H    OBJECTIVE  Filed Vitals:   05/02/13 0045 05/02/13 0105 05/02/13 0448 05/02/13 0452  BP:    125/57  Pulse:    99  Temp:    97.5 F (36.4 C)  TempSrc:    Oral  Resp:    22  Height:      Weight:    128 lb 4.9 oz (58.2 kg)  SpO2: 88% 91% 91% 92%    Intake/Output Summary (Last 24 hours) at 05/02/13 0724 Last data filed at 05/02/13 0452  Gross per 24 hour  Intake    735 ml  Output   1400 ml  Net   -665 ml   Filed Weights   04/29/13 2000 05/02/13 0452  Weight: 135 lb 2.3 oz (61.3 kg) 128 lb 4.9 oz (58.2 kg)    PHYSICAL EXAM  General: Pleasant, NAD.  Nasal oxygen in place Neuro: Alert and oriented X 3. Moves all extremities spontaneously. Psych: Normal affect. HEENT:  Normal  Neck:  Supple without bruits or JVD. Lungs:  Bronchial breath sounds at right apex.  Decreased breath sounds on the left. Heart: RRR no s3, s4, or murmurs. Abdomen: Soft, non-tender, non-distended, BS + x 4.  Extremities: No clubbing, cyanosis or edema. DP/PT/Radials 2+ and equal bilaterally.  Accessory Clinical Findings  CBC  Recent Labs  04/29/13 1130  05/01/13 0241 05/02/13 0510  WBC 12.1*  < > 4.2 3.9*  NEUTROABS 11.8*  --   --   --   HGB 9.4*  < > 11.5* 11.6*  HCT 27.8*  < > 33.1* 33.5*  MCV 100.0  < > 94.0 95.7  PLT 146*  < > 74* 48*  < > = values in this interval not displayed. Basic Metabolic Panel  Recent Labs  05/01/13 0241 05/02/13 0510  NA 133* 133*  K 4.1 4.0  CL 99 96  CO2 29 26  GLUCOSE 114* 99  BUN 24* 22  CREATININE 0.98 1.02  CALCIUM 8.9 8.8   Liver Function Tests No results found for this basename: AST, ALT, ALKPHOS, BILITOT, PROT, ALBUMIN,  in the last 72  hours No results found for this basename: LIPASE, AMYLASE,  in the last 72 hours Cardiac Enzymes  Recent Labs  04/30/13 2129 05/01/13 0241 05/01/13 0915  TROPONINI <0.30 0.41* 0.39*   BNP No components found with this basename: POCBNP,  D-Dimer No results found for this basename: DDIMER,  in the last 72 hours Hemoglobin A1C No results found for this basename: HGBA1C,  in the last 72 hours Fasting Lipid Panel No results found for this basename: CHOL, HDL, LDLCALC, TRIG, CHOLHDL, LDLDIRECT,  in the last 72 hours Thyroid Function Tests No results found for this basename: TSH, T4TOTAL, FREET3, T3FREE, THYROIDAB,  in the last 72 hours  TELE  Sinus tachycardia  ECG  Sinus tachycardia Nonspecific ST abnormality Abnormal ECG  Radiology/Studies  [place] Chest  04/29/2013   CLINICAL DATA:  Patient with history of metastatic non-small cell lung carcinoma, dyspnea, recurrent left pleural effusion. Request is made for therapeutic left thoracentesis.  EXAM: CHEST ULTRASOUND  COMPARISON:  Previous  thoracentesis on 04/27/2013  FINDINGS: On limited ultrasound of the left posterior chest region today there is minimal free pleural fluid noted. The collection is extensively multi-septated size multiloculated. . Due to these findings therapeutic thoracentesis was not performed at this time.  IMPRESSION: Limited ultrasound left posterior chest today reveals extensive multiloculated/multi-septated collection in pleural space. Minimal free fluid noted. Thoracentesis was not performed at this time. Recommend consultation with thoracic surgery for further evaluation.  Read by: Jeananne Rama ,P.A.-C.   Electronically Signed   By: Irish Lack M.D.   On: 04/29/2013 15:04   Dg Chest Portable 1 View  04/29/2013   CLINICAL DATA:  Shortness of breath.  EXAM: PORTABLE CHEST - 1 VIEW  COMPARISON:  None.  FINDINGS: Opacity obscures the left heart border and left hemidiaphragm. There is coarse irregular opacity in the left upper lobe extending to the apex. Irregularly thickened bronchovascular/ interstitial markings are noted throughout the aerated left lung and in the right lung base with milder peripheral for interstitial scarring in the right upper lobe.  No pneumothorax. The cardiac silhouette is normal in overall size. The mediastinum is normal in contour.  IMPRESSION: Left lung base opacity is likely combination pleural fluid with either atelectasis, infiltrate or a combination.  Left upper lobe irregular lung opacity is most suggestive of scarring although infiltrate is also possible.  No convincing acute findings in the right lung. There is evidence of COPD.   Electronically Signed   By: Amie Portland M.D.   On: 04/29/2013 12:14   Echo:  05/01/13:   Normal LV function 60-65%, trivial valvular disease   ASSESSMENT AND PLAN 1. Chest pain with abnormal troponin - his Troponin levels are quite minimal and do not need any further follow up .   I suspect they are due to issues from the metastatic cancer.   No a  candidate for invasive procedures.   Normal LV function.  Continue supportive care for his metastatic lung cancer.    I see no indication for simvastatin.  I will Dc.  I do not think this will make any difference given his metastatic lung ca.  Will sign off.  Call for questions.  2. Metastatic Lung Cancer  3. Malignant Pleural Effusion  4. Back pain s/p recent left thoracentesis  3. Health Care Associated Pneumonia    Alvia Grove., MD, [facility] 05/02/2013, 7:36 AM Office - [phone] Pager [phone]

## 2013-05-02 NOTE — Progress Notes (Signed)
ADDENDUM: Patient's platelet count is c. 50K and falling. CT scan shows a PE. Will start patient on lovenox but use the 1 mg/kg dose Q 12 instead of the 1.5 mg/kg Q 24. Will check CBC in AM and if platelets have fallen further would consider a further decrease in lovenox dose. Discussed with RN

## 2013-05-02 NOTE — Progress Notes (Signed)
[facility] PROGRESS NOTE  DEYON CHIZEK ZOX:[id number] DOB: 1940/05/20 DOA: 04/29/2013 PCP: No primary provider on file.  Assessment/Plan: Acute on Chronic Respiratory Failure -Suspect HCAP on top of baseline metastatic lung cancer and malignant pleural effusions. -Had respiratory distress overnight. -CXR with worsening left-sided consolidation. -Not sufficient pleural fluid for thoracentesis. -OOB to chair, mobilize more, incentive spirometry.  Sepsis -2/2 HCAP. -Septic parameters improved. -BP, HR better.  HCAP -Continue vanc/cefepime. -Cx data pending. -Influenza PCR negative: DC tamiflu, DC droplet precautions.  AOCD -Hb remains stable at 11.6 s/p 2 units of PRBCs on 12/20.  Chronic Systolic CHF -Compensated. -Coreg has been restarted as BP improved. -Had some chest discomfort overnight that I suspect is related to cough from his acute respiratory illness, second troponin was elevated. -Appreciate cards input. -2D ECHO: Left ventricle: The cavity size was normal. There was mild concentric hypertrophy. Systolic function was normal. The estimated ejection fraction was in the range of 60% to 65%. Wall motion was normal; there were no regional wall motion abnormalities. Doppler parameters are consistent with abnormal left ventricular relaxation (grade 1 diastolic dysfunction). -No further cardiac workup is anticipated at this point.     Code Status: DNR Family Communication: Discussed with wife at bedside.  Disposition Plan: Home once medically stable.   Consultants:  None   Antibiotics:  Vancomycin 04/29/13-->  Cefepime 04/29/13-->  Tamiflu 12/19-->04/30/13     Subjective: Had increased WOB and respiratory distress overnight requiring a NRB. Is now back on 6L [place].  Objective: Filed Vitals:   05/02/13 0045 05/02/13 0105 05/02/13 0448 05/02/13 0452  BP:    125/57  Pulse:    99  Temp:    97.5 F (36.4 C)  TempSrc:    Oral  Resp:    22   Height:      Weight:    58.2 kg (128 lb 4.9 oz)  SpO2: 88% 91% 91% 92%    Intake/Output Summary (Last 24 hours) at 05/02/13 1209 Last data filed at 05/02/13 0859  Gross per 24 hour  Intake    975 ml  Output   1400 ml  Net   -425 ml   Filed Weights   04/29/13 2000 05/02/13 0452  Weight: 61.3 kg (135 lb 2.3 oz) 58.2 kg (128 lb 4.9 oz)    Exam:   General:  AA Ox3  Cardiovascular: RRR  Respiratory: decreased BS left base.  Abdomen: S/NT/ND/+BS  Extremities: no C/C/E/+pulses   Neurologic:  Non-focal.  Data Reviewed: Basic Metabolic Panel:  Recent Labs Lab 04/29/13 1130 04/29/13 2047 04/30/13 0340 05/01/13 0241 05/02/13 0510  NA 134*  --  132* 133* 133*  K 4.1  --  4.2 4.1 4.0  CL 96  --  96 99 96  CO2 29  --  29 29 26   GLUCOSE 147*  --  95 114* 99  BUN 33*  --  25* 24* 22  CREATININE 1.27 1.08 1.04 0.98 1.02  CALCIUM 9.3  --  8.6 8.9 8.8   Liver Function Tests: No results found for this basename: AST, ALT, ALKPHOS, BILITOT, PROT, ALBUMIN,  in the last 168 hours No results found for this basename: LIPASE, AMYLASE,  in the last 168 hours No results found for this basename: AMMONIA,  in the last 168 hours CBC:  Recent Labs Lab 04/29/13 1130 04/29/13 2047 04/30/13 0340 04/30/13 2129 05/01/13 0241 05/02/13 0510  WBC 12.1* 9.3 7.2 4.4 4.2 3.9*  NEUTROABS 11.8*  --   --   --   --   --  HGB 9.4* 8.9* 8.0* 11.3* 11.5* 11.6*  HCT 27.8* 26.6* 23.6* 32.5* 33.1* 33.5*  MCV 100.0 100.0 100.0 93.9 94.0 95.7  PLT 146* 125* 125* 82* 74* 48*   Cardiac Enzymes:  Recent Labs Lab 04/29/13 1130 04/30/13 2129 05/01/13 0241 05/01/13 0915  TROPONINI <0.30 <0.30 0.41* 0.39*   BNP (last 3 results) No results found for this basename: PROBNP,  in the last 8760 hours CBG: No results found for this basename: GLUCAP,  in the last 168 hours  Recent Results (from the past 240 hour(s))  MRSA PCR SCREENING     Status: None   Collection Time    04/29/13  7:51 PM       Result Value Range Status   MRSA by PCR NEGATIVE  NEGATIVE Final   Comment:            The GeneXpert MRSA Assay (FDA     approved for NASAL specimens     only), is one component of a     comprehensive MRSA colonization     surveillance program. It is not     intended to diagnose MRSA     infection nor to guide or     monitor treatment for     MRSA infections.  CULTURE, BLOOD (ROUTINE X 2)     Status: None   Collection Time    04/29/13  8:52 PM      Result Value Range Status   Specimen Description BLOOD LEFT ARM   Final   Special Requests BOTTLES DRAWN AEROBIC AND ANAEROBIC 4CC   Final   Culture  Setup Time     Final   Value: 04/30/2013 00:39     Performed at [facility]   Culture     Final   Value:        BLOOD CULTURE RECEIVED NO GROWTH TO DATE CULTURE WILL BE HELD FOR 5 DAYS BEFORE ISSUING A FINAL NEGATIVE REPORT     Performed at [facility]   Report Status PENDING   Incomplete  CULTURE, BLOOD (ROUTINE X 2)     Status: None   Collection Time    04/29/13  9:00 PM      Result Value Range Status   Specimen Description BLOOD RIGHT HAND   Final   Special Requests BOTTLES DRAWN AEROBIC ONLY 1CC   Final   Culture  Setup Time     Final   Value: 04/30/2013 00:38     Performed at [facility]   Culture     Final   Value:        BLOOD CULTURE RECEIVED NO GROWTH TO DATE CULTURE WILL BE HELD FOR 5 DAYS BEFORE ISSUING A FINAL NEGATIVE REPORT     Performed at [facility]   Report Status PENDING   Incomplete  CULTURE, EXPECTORATED SPUTUM-ASSESSMENT     Status: None   Collection Time    04/30/13  2:18 AM      Result Value Range Status   Specimen Description SPU   Final   Special Requests NONE   Final   Sputum evaluation     Final   Value: THIS SPECIMEN IS ACCEPTABLE. RESPIRATORY CULTURE REPORT TO FOLLOW.   Report Status 04/30/2013 FINAL   Final  CULTURE, RESPIRATORY (NON-EXPECTORATED)     Status: None   Collection Time    04/30/13  2:18 AM       Result Value Range Status   Specimen Description SPUTUM  Final   Special Requests NONE   Final   Gram Stain PENDING   Incomplete   Culture     Final   Value: FEW GRAM NEGATIVE RODS     Performed at [facility]   Report Status PENDING   Incomplete     Studies: Dg Chest Port 1 View  05/02/2013   CLINICAL DATA:  Pneumonia  EXAM: PORTABLE CHEST - 1 VIEW  COMPARISON:  April 29, 2013  FINDINGS: There is new airspace consolidation in the right upper lobe. Extensive interstitial edema as well as airspace consolidation throughout much of the remainder the left lung is clear. There is interstitial edema on the right with areas of scarring, stable. Heart is enlarged with pulmonary vascularity reflecting underlying emphysema. No adenopathy appreciable.  IMPRESSION: Extensive consolidation on the left, increase, particularly in the left upper lobe. There appears to be underlying congestive heart failure superimposed on emphysema. The changes on the left are suspicious for a degree of pneumonia, particularly in the left upper lobe.   Electronically Signed   By: Bretta Bang M.D.   On: 05/02/2013 07:29    Scheduled Meds: . aspirin EC  162 mg Oral Daily  . carvedilol  3.125 mg Oral BID WC  . ceFEPime (MAXIPIME) IV  1 g Intravenous Q8H  . cholecalciferol  1,000 Units Oral Daily  . folic acid  1 mg Oral Daily  . ipratropium  0.5 mg Nebulization QID  . levalbuterol  0.63 mg Nebulization Q4H WA  . loratadine  10 mg Oral Daily  . multivitamin with minerals  1 tablet Oral Daily  . vancomycin  500 mg Intravenous Q12H   Continuous Infusions: . sodium chloride 50 mL/hr at 05/02/13 1112    Principal Problem:   Acute-on-chronic respiratory failure Active Problems:   HCAP (healthcare-associated pneumonia)   Lung cancer   Brain metastasis   Anemia, chronic disease   Sepsis    Time spent: 35 minutes. Greater than 50% of this time was spent in direct contact with the patient  coordinating care.    Luis Payne  [facility] Pager [phone]  If 7PM-7AM, please contact night-coverage at [email or web address], password [facility] 05/02/2013, 12:09 PM  LOS: 3 days

## 2013-05-03 ENCOUNTER — Ambulatory Visit: Payer: Medicare Other | Admitting: Physician Assistant

## 2013-05-03 ENCOUNTER — Ambulatory Visit: Payer: Medicare Other

## 2013-05-03 ENCOUNTER — Telehealth: Payer: Self-pay | Admitting: Medical Oncology

## 2013-05-03 ENCOUNTER — Other Ambulatory Visit: Payer: Medicare Other

## 2013-05-03 DIAGNOSIS — D696 Thrombocytopenia, unspecified: Secondary | ICD-10-CM

## 2013-05-03 DIAGNOSIS — C3490 Malignant neoplasm of unspecified part of unspecified bronchus or lung: Secondary | ICD-10-CM

## 2013-05-03 DIAGNOSIS — I2699 Other pulmonary embolism without acute cor pulmonale: Secondary | ICD-10-CM

## 2013-05-03 LAB — CBC WITH DIFFERENTIAL/PLATELET
Basophils Relative: 0 % (ref 0–1)
Eosinophils Absolute: 0 10*3/uL (ref 0.0–0.7)
HCT: 35.2 % — ABNORMAL LOW (ref 39.0–52.0)
Hemoglobin: 12.1 g/dL — ABNORMAL LOW (ref 13.0–17.0)
Lymphs Abs: 0.2 10*3/uL — ABNORMAL LOW (ref 0.7–4.0)
MCH: 32.4 pg (ref 26.0–34.0)
MCHC: 34.4 g/dL (ref 30.0–36.0)
MCV: 94.4 fL (ref 78.0–100.0)
Monocytes Absolute: 0.1 10*3/uL (ref 0.1–1.0)
Monocytes Relative: 2 % — ABNORMAL LOW (ref 3–12)
Neutro Abs: 4.3 10*3/uL (ref 1.7–7.7)
RBC: 3.73 MIL/uL — ABNORMAL LOW (ref 4.22–5.81)
RDW: 14.6 % (ref 11.5–15.5)

## 2013-05-03 LAB — TYPE AND SCREEN
ABO/RH(D): A POS
Antibody Screen: NEGATIVE
Unit division: 0
Unit division: 0
Unit division: 0

## 2013-05-03 MED ORDER — LEVALBUTEROL HCL 0.63 MG/3ML IN NEBU
0.6300 mg | INHALATION_SOLUTION | Freq: Four times a day (QID) | RESPIRATORY_TRACT | Status: DC
  Filled 2013-05-03 (×2): qty 3

## 2013-05-03 MED ORDER — CEFAZOLIN SODIUM 1-5 GM-% IV SOLN
1.0000 g | Freq: Three times a day (TID) | INTRAVENOUS | Status: AC
Start: 2013-05-03 — End: 2013-05-03
  Administered 2013-05-03: 14:00:00 1 g via INTRAVENOUS
  Filled 2013-05-03: qty 50

## 2013-05-03 MED ORDER — ENOXAPARIN SODIUM 60 MG/0.6ML ~~LOC~~ SOLN
1.0000 mg/kg | SUBCUTANEOUS | Status: DC
  Administered 2013-05-03 – 2013-05-06 (×4): 60 mg via SUBCUTANEOUS
  Filled 2013-05-03 (×5): qty 0.6

## 2013-05-03 MED ORDER — PHENYLEPHRINE HCL 0.5 % NA SOLN
2.0000 [drp] | Freq: Four times a day (QID) | NASAL | Status: DC | PRN
  Administered 2013-05-03: 04:00:00 2 [drp] via NASAL
  Filled 2013-05-03: qty 15

## 2013-05-03 NOTE — Telephone Encounter (Signed)
fax received- Dr Arbutus Ped aware

## 2013-05-03 NOTE — Progress Notes (Addendum)
Patient oxygen saturation at rest on 2L is 88%, on 4L at rest is 97%.  During ambulation patient drops to 83% on 4L.  Turned patient up to 6L while ambulating and oxygen saturation comes up to 93%.  Will continue to monitor.

## 2013-05-03 NOTE — Progress Notes (Signed)
ANTIBIOTIC CONSULT NOTE - FOLLOW UP  Pharmacy Consult for Vancomycin, Cefepime Indication: rule out pneumonia  No Known Allergies  Patient Measurements: Height: 5\' 6"  (167.6 cm) Weight: 130 lb 4.7 oz (59.1 kg) IBW/kg (Calculated) : 63.8  Vital Signs: Temp: 98.2 F (36.8 C) (12/23 0408) Temp src: Oral (12/23 0408) BP: 124/58 mmHg (12/23 0408) Pulse Rate: 101 (12/23 0408) Intake/Output from previous day: 12/22 0701 - 12/23 0700 In: 1895 [P.O.:240; I.V.:1455; IV Piggyback:200] Out: 1625 [Urine:1625]  Labs:  Recent Labs  05/01/13 0241 05/02/13 0510 05/03/13 0502  WBC 4.2 3.9* 4.6  HGB 11.5* 11.6* 12.1*  PLT 74* 48* 34*  CREATININE 0.98 1.02  --    Estimated Creatinine Clearance: 53.1 ml/min (by C-G formula based on Cr of 1.02).  Recent Labs  05/03/13 1042  VANCOTROUGH 11.9     Microbiology: 12/19 blood: NGTD 12/20 sputum: Klebsiella (pan-sens to all tested except ampicillin) 12/19 MRSA PCR: negative 12/19 Influenza: negative  Anti-infectives: 12/19 >> cefepime >> 12/19 >> vancomcyin >>   Assessment: Luis Payne admitted 12/19 with suspected pneumonia. Hx of metastatic lung CA with worsening SOB for past day. On Gemzar for chemo as outpatient. Pharmacy has been consulted to dose cefepime and vancomycin.  Day #5 Vancomycin and Cefepime  Tmax: AF  WBCs: Improved to WNL  Renal: SCr 1.02, CrCl 52 (CG)  Vancomycin trough level = 11.9  Respiratory culture with Klebsiella - narrow to cefazolin today per Md.   Goal of Therapy:  Vancomycin trough level 15-20 mcg/ml Appropriate abx dosing, eradication of infection.   Plan:   Cefazolin 1g IV q8h  D/C vancomycin and cefepime  Follow up renal fxn and culture results as available.   Lynann Beaver PharmD, BCPS Pager [phone] 05/03/2013 11:49 AM

## 2013-05-03 NOTE — Progress Notes (Signed)
Patient began experiencing epistaxis Platelets 48. Per patient, icepack applied to neck. NP on call notified, orders for afrin placed and administered. Nosebleed stopped about 15 mins after interventions.

## 2013-05-03 NOTE — Progress Notes (Signed)
Subjective: The patient is seen and examined today. His wife was at the bedside. He is feeling a bit better today with less shortness of breath after he was started yesterday on intravenous methylprednisolone. He had CT angiogram of the chest performed last night. He denied having any fever or chills. The patient denied having any nausea or vomiting.  Objective: Vital signs in last 24 hours: Temp:  [98 F (36.7 C)-98.2 F (36.8 C)] 98.2 F (36.8 C) (12/23 0408) Pulse Rate:  [99-101] 101 (12/23 0408) Resp:  [20-26] 20 (12/23 0408) BP: (122-128)/(58-59) 124/58 mmHg (12/23 0408) SpO2:  [89 %-96 %] 94 % (12/23 0408) Weight:  [130 lb 4.7 oz (59.1 kg)] 130 lb 4.7 oz (59.1 kg) (12/23 0411)  Intake/Output from previous day: 12/22 0701 - 12/23 0700 In: 1895 [P.O.:240; I.V.:1455; IV Piggyback:200] Out: 1625 [Urine:1625] Intake/Output this shift:    General appearance: alert, cooperative, fatigued and mild distress Resp: rales LLL and LUL Cardio: regular rate and rhythm, S1, S2 normal, no murmur, click, rub or gallop GI: soft, non-tender; bowel sounds normal; no masses,  no organomegaly Extremities: extremities normal, atraumatic, no cyanosis or edema  Lab Results:   Recent Labs  05/02/13 0510 05/03/13 0502  WBC 3.9* 4.6  HGB 11.6* 12.1*  HCT 33.5* 35.2*  PLT 48* 34*   BMET  Recent Labs  05/01/13 0241 05/02/13 0510  NA 133* 133*  K 4.1 4.0  CL 99 96  CO2 29 26  GLUCOSE 114* 99  BUN 24* 22  CREATININE 0.98 1.02  CALCIUM 8.9 8.8    Studies/Results: Ct Angio Chest Pe W/cm &/or Wo Cm  05/02/2013   CLINICAL DATA:  Dyspnea, history of lung cancer and congestive heart failure, evaluate for pulmonary embolism  EXAM: CT ANGIOGRAPHY CHEST WITH CONTRAST  TECHNIQUE: Multidetector CT imaging of the chest was performed using the standard protocol during bolus administration of intravenous contrast. Multiplanar CT image reconstructions including MIPs were obtained to evaluate the  vascular anatomy.  CONTRAST:  OMNIPAQUE IOHEXOL 350 MG/ML SOLN  COMPARISON:  Chest radiograph - 05/02/2013; 04/29/2013  FINDINGS: Vascular Findings:  There is adequate opacification of the pulmonary arterial system with the main pulmonary artery measuring 444 Hounsfield units. There are mixed occlusive and nonocclusive filling defects within the right upper (image 94, series 6), middle (image 140) and lower (image 149) lobe segmental bronchi. There is a minimal amount of nonocclusive thrombus within the caudal aspect of the right lower lobe main pulmonary artery (image 140). No discrete filling defects are seen within the pulmonary arterial tree of the left lung. Overall clot burden is deemed small in volume. No definitive enlargement of the caliber the main pulmonary artery. There is CT evidence of right-sided heart strain. There is no reflux of contrast into the hepatic venous system.  Normal heart size. Coronary artery calcifications. No pericardial effusion. Normal caliber the main pulmonary artery. Conventional configuration of the aortic arch. Major branch vessels of the aortic arch widely patent throughout their imaged course. No thoracic aortic dissection or periaortic stranding.  Review of the MIP images confirms the above findings.   ----------------------------------------------------------------------------------  Nonvascular Findings:  There is near complete consolidation of the left lung, most severely involving the left lower lobe and lingula. There is complete obstruction of the left lower lobe bronchus (representative axial images 30 and 33, series 4). There is rather extensive interstitial thickening within the aerated left upper lobe with scattered air bronchograms both within the left upper and lower lobes.  There are two punctate pneumothoraces within the left hemi thorax adjacent to the left lung apex (image 11, series 4) and left mid lung (image 38, series 4).  There is a moderate sized  left-sided pleural effusion. Additionally, there are multiple enhancing pleural nodules compatible with metastatic disease (representative axial images 34, 53, and 71).  There is extensive centrilobular and paraseptal emphysematous change within the aerated right lung. There is a large pneumatocele involving the majority of the superior segment of the right lower lobe. Note is made of a punctate (approximately 0.7 x 0.5 cm) subpleural nodule within in the right lower lobe middle lobe (image 62). There is a trace right-sided pleural effusion.  Scattered shotty mediastinal lymph nodes individually not enlarged by size criteria with index prevascular lymph node measuring 0.6 cm in diameter. Right hilar adenopathy with index super hilar nodal conglomeration measuring 1.2 cm in diameter. No definite axillary adenopathy.  Limited early arterial phase evaluation of the upper abdomen is normal.  Stigmata of ankylosing spondylitis within the thoracic and image caudal aspect of the cervical spine. No acute or aggressive osseous abnormalities.  IMPRESSION: 1. The examination is positive for small burden pulmonary emboli to the right lung arterial tree as detailed above. No CT evidence of right-sided heart strain. 2. Near complete consolidation of the left lung with associated moderate sized left sided pleural effusion and multiple enhancing left-sided pleural nodules compatible with metastatic disease. In the absence of prior examinations, the stability of these findings are indeterminate. 3. Two punctate foci of pneumothorax within the left lung, possibly ex vacuo in etiology. 4. Presumed metastatic disease to the contralateral right lung with right hilar lymphadenopathy and indeterminate punctate (approximately 0.7 cm) nodule within the subpleural aspect of the right middle lobe. 5. Advanced emphysematous change with large pneumatocele involving the majority of the superior segment of the right lower lobe. 6. Stigmata of  ankylosing spondylitis. No definite acute or aggressive osseus abnormalities.   Electronically Signed   By: Simonne Come M.D.   On: 05/02/2013 20:50   Dg Chest Port 1 View  05/02/2013   CLINICAL DATA:  Pneumonia  EXAM: PORTABLE CHEST - 1 VIEW  COMPARISON:  April 29, 2013  FINDINGS: There is new airspace consolidation in the right upper lobe. Extensive interstitial edema as well as airspace consolidation throughout much of the remainder the left lung is clear. There is interstitial edema on the right with areas of scarring, stable. Heart is enlarged with pulmonary vascularity reflecting underlying emphysema. No adenopathy appreciable.  IMPRESSION: Extensive consolidation on the left, increase, particularly in the left upper lobe. There appears to be underlying congestive heart failure superimposed on emphysema. The changes on the left are suspicious for a degree of pneumonia, particularly in the left upper lobe.   Electronically Signed   By: Bretta Bang M.D.   On: 05/02/2013 07:29    Medications: I have reviewed the patient's current medications.  CODE STATUS: No CODE BLUE  Assessment/Plan: 1) Metastatic non-small cell lung cancer, adenocarcinoma with metastatic brain lesion status post stereotactic radiotherapy to 2 brain lesions in addition to palliative radiotherapy to the left lung mass followed by 6 cycles of systemic chemotherapy with carboplatin and Alimta discontinued secondary to disease progression. He was also treated with Tarceva for 2 months but unfortunately has evidence for disease progression. He was started on treatment with single agent gemcitabine status post 1 dose.  2) dyspnea: Multifactorial including progressive non-small cell lung cancer, COPD, questionable left lung pneumonitis, pneumonia  and left pleural effusion. The patient is feeling a little bit better after starting intravenous methylprednisolone yesterday. He'll continue on the same treatment. Continue current  antibiotics. 3) New small pulmonary embolism: Continue Lovenox 1 mg/KG on daily basis as long his platelets count are still low less than 50,000. Consider increasing the dose to 1.5 mg/KG once his platelets count are over 50,000. 4) prognosis: Poor with the multifactorial etiology of his shortness of breath. Continue current supportive care. Consider palliative care consult for discussion of goals of care. 5) CODE STATUS: No CODE BLUE.     LOS: 4 days    Whittney Steenson K. 05/03/2013

## 2013-05-03 NOTE — Progress Notes (Signed)
[facility] PROGRESS NOTE  VICENT FEBLES ZOX:[id number] DOB: 04/19/1941 DOA: 04/29/2013 PCP: No primary provider on file.  Assessment/Plan: Acute on Chronic Respiratory Failure -2/2 HCAP, malignant effusions, lung cancer and now acute PE, on top of ? Gemcitabine-induced pneumonitis. -Sputum cx with klebsiella. -Will narrow abx down to Ancef. -Has been started on steroids for potential pneumonitis. -Not sufficient pleural fluid for thoracentesis. -OOB to chair, mobilize more, incentive spirometry.  Sepsis -2/2 HCAP. -Septic parameters improved. -BP, HR better.  HCAP -Abx narrowed to Ancef given respiratory cx data. -Influenza PCR negative: DC tamiflu, DC droplet precautions.  AOCD -Hb remains stable at 12.1 s/p 2 units of PRBCs on 12/20.  Chronic Systolic CHF -Compensated. -Coreg has been restarted as BP improved. -Appreciate cards input. -2D ECHO: Left ventricle: The cavity size was normal. There was mild concentric hypertrophy. Systolic function was normal. The estimated ejection fraction was in the range of 60% to 65%. Wall motion was normal; there were no regional wall motion abnormalities. Doppler parameters are consistent with abnormal left ventricular relaxation (grade 1 diastolic dysfunction). -No further cardiac workup is anticipated at this point.  Thrombocytopenia -Platelets continue to decrease. -Watch while on lovenox.  Acute PE -Continue lovenox at a reduced dose given thrombocytopenia per heme onc recommendations.     Code Status: DNR Family Communication: Discussed with wife at bedside.  Disposition Plan: Home once medically stable.   Consultants:  None   Antibiotics:  Vancomycin 04/29/13-->12/23  Cefepime 04/29/13-->12/23  Tamiflu 12/19-->04/30/13   Ancef 05/03/13-->  Subjective: WOB has improved. He feels much less SOB.  Objective: Filed Vitals:   05/02/13 2355 05/03/13 0408 05/03/13 0411 05/03/13 1350  BP:  124/58   131/61  Pulse:  101  93  Temp:  98.2 F (36.8 C)  97.6 F (36.4 C)  TempSrc:  Oral  Oral  Resp:  20  20  Height:      Weight:   59.1 kg (130 lb 4.7 oz)   SpO2: 96% 94%  95%    Intake/Output Summary (Last 24 hours) at 05/03/13 1405 Last data filed at 05/03/13 1300  Gross per 24 hour  Intake   1985 ml  Output   1950 ml  Net     35 ml   Filed Weights   04/29/13 2000 05/02/13 0452 05/03/13 0411  Weight: 61.3 kg (135 lb 2.3 oz) 58.2 kg (128 lb 4.9 oz) 59.1 kg (130 lb 4.7 oz)    Exam:   General:  AA Ox3  Cardiovascular: RRR  Respiratory: decreased BS left base.  Abdomen: S/NT/ND/+BS  Extremities: no C/C/E/+pulses   Neurologic:  Non-focal.  Data Reviewed: Basic Metabolic Panel:  Recent Labs Lab 04/29/13 1130 04/29/13 2047 04/30/13 0340 05/01/13 0241 05/02/13 0510  NA 134*  --  132* 133* 133*  K 4.1  --  4.2 4.1 4.0  CL 96  --  96 99 96  CO2 29  --  29 29 26   GLUCOSE 147*  --  95 114* 99  BUN 33*  --  25* 24* 22  CREATININE 1.27 1.08 1.04 0.98 1.02  CALCIUM 9.3  --  8.6 8.9 8.8   Liver Function Tests: No results found for this basename: AST, ALT, ALKPHOS, BILITOT, PROT, ALBUMIN,  in the last 168 hours No results found for this basename: LIPASE, AMYLASE,  in the last 168 hours No results found for this basename: AMMONIA,  in the last 168 hours CBC:  Recent Labs Lab 04/29/13 1130  04/30/13 0340 04/30/13  2129 05/01/13 0241 05/02/13 0510 05/03/13 0502  WBC 12.1*  < > 7.2 4.4 4.2 3.9* 4.6  NEUTROABS 11.8*  --   --   --   --   --  4.3  HGB 9.4*  < > 8.0* 11.3* 11.5* 11.6* 12.1*  HCT 27.8*  < > 23.6* 32.5* 33.1* 33.5* 35.2*  MCV 100.0  < > 100.0 93.9 94.0 95.7 94.4  PLT 146*  < > 125* 82* 74* 48* 34*  < > = values in this interval not displayed. Cardiac Enzymes:  Recent Labs Lab 04/29/13 1130 04/30/13 2129 05/01/13 0241 05/01/13 0915  TROPONINI <0.30 <0.30 0.41* 0.39*   BNP (last 3 results) No results found for this basename: PROBNP,  in the  last 8760 hours CBG: No results found for this basename: GLUCAP,  in the last 168 hours  Recent Results (from the past 240 hour(s))  MRSA PCR SCREENING     Status: None   Collection Time    04/29/13  7:51 PM      Result Value Range Status   MRSA by PCR NEGATIVE  NEGATIVE Final   Comment:            The GeneXpert MRSA Assay (FDA     approved for NASAL specimens     only), is one component of a     comprehensive MRSA colonization     surveillance program. It is not     intended to diagnose MRSA     infection nor to guide or     monitor treatment for     MRSA infections.  CULTURE, BLOOD (ROUTINE X 2)     Status: None   Collection Time    04/29/13  8:52 PM      Result Value Range Status   Specimen Description BLOOD LEFT ARM   Final   Special Requests BOTTLES DRAWN AEROBIC AND ANAEROBIC 4CC   Final   Culture  Setup Time     Final   Value: 04/30/2013 00:39     Performed at [facility]   Culture     Final   Value:        BLOOD CULTURE RECEIVED NO GROWTH TO DATE CULTURE WILL BE HELD FOR 5 DAYS BEFORE ISSUING A FINAL NEGATIVE REPORT     Performed at [facility]   Report Status PENDING   Incomplete  CULTURE, BLOOD (ROUTINE X 2)     Status: None   Collection Time    04/29/13  9:00 PM      Result Value Range Status   Specimen Description BLOOD RIGHT HAND   Final   Special Requests BOTTLES DRAWN AEROBIC ONLY 1CC   Final   Culture  Setup Time     Final   Value: 04/30/2013 00:38     Performed at [facility]   Culture     Final   Value:        BLOOD CULTURE RECEIVED NO GROWTH TO DATE CULTURE WILL BE HELD FOR 5 DAYS BEFORE ISSUING A FINAL NEGATIVE REPORT     Performed at [facility]   Report Status PENDING   Incomplete  CULTURE, EXPECTORATED SPUTUM-ASSESSMENT     Status: None   Collection Time    04/30/13  2:18 AM      Result Value Range Status   Specimen Description SPU   Final   Special Requests NONE   Final   Sputum evaluation     Final  Value: THIS SPECIMEN IS ACCEPTABLE. RESPIRATORY CULTURE REPORT TO FOLLOW.   Report Status 04/30/2013 FINAL   Final  CULTURE, RESPIRATORY (NON-EXPECTORATED)     Status: None   Collection Time    04/30/13  2:18 AM      Result Value Range Status   Specimen Description SPUTUM   Final   Special Requests NONE   Final   Gram Stain     Final   Value: FEW WBC PRESENT, PREDOMINANTLY PMN     NO SQUAMOUS EPITHELIAL CELLS SEEN     NO ORGANISMS SEEN     Performed at [facility]   Culture     Final   Value: FEW KLEBSIELLA SPECIES     Performed at [facility]   Report Status 05/02/2013 FINAL   Final   Organism ID, Bacteria KLEBSIELLA SPECIES   Final     Studies: Ct Angio Chest Pe W/cm &/or Wo Cm  05/02/2013   CLINICAL DATA:  Dyspnea, history of lung cancer and congestive heart failure, evaluate for pulmonary embolism  EXAM: CT ANGIOGRAPHY CHEST WITH CONTRAST  TECHNIQUE: Multidetector CT imaging of the chest was performed using the standard protocol during bolus administration of intravenous contrast. Multiplanar CT image reconstructions including MIPs were obtained to evaluate the vascular anatomy.  CONTRAST:  OMNIPAQUE IOHEXOL 350 MG/ML SOLN  COMPARISON:  Chest radiograph - 05/02/2013; 04/29/2013  FINDINGS: Vascular Findings:  There is adequate opacification of the pulmonary arterial system with the main pulmonary artery measuring 444 Hounsfield units. There are mixed occlusive and nonocclusive filling defects within the right upper (image 94, series 6), middle (image 140) and lower (image 149) lobe segmental bronchi. There is a minimal amount of nonocclusive thrombus within the caudal aspect of the right lower lobe main pulmonary artery (image 140). No discrete filling defects are seen within the pulmonary arterial tree of the left lung. Overall clot burden is deemed small in volume. No definitive enlargement of the caliber the main pulmonary artery. There is CT evidence of  right-sided heart strain. There is no reflux of contrast into the hepatic venous system.  Normal heart size. Coronary artery calcifications. No pericardial effusion. Normal caliber the main pulmonary artery. Conventional configuration of the aortic arch. Major branch vessels of the aortic arch widely patent throughout their imaged course. No thoracic aortic dissection or periaortic stranding.  Review of the MIP images confirms the above findings.   ----------------------------------------------------------------------------------  Nonvascular Findings:  There is near complete consolidation of the left lung, most severely involving the left lower lobe and lingula. There is complete obstruction of the left lower lobe bronchus (representative axial images 30 and 33, series 4). There is rather extensive interstitial thickening within the aerated left upper lobe with scattered air bronchograms both within the left upper and lower lobes. There are two punctate pneumothoraces within the left hemi thorax adjacent to the left lung apex (image 11, series 4) and left mid lung (image 38, series 4).  There is a moderate sized left-sided pleural effusion. Additionally, there are multiple enhancing pleural nodules compatible with metastatic disease (representative axial images 34, 53, and 71).  There is extensive centrilobular and paraseptal emphysematous change within the aerated right lung. There is a large pneumatocele involving the majority of the superior segment of the right lower lobe. Note is made of a punctate (approximately 0.7 x 0.5 cm) subpleural nodule within in the right lower lobe middle lobe (image 62). There is a trace right-sided pleural effusion.  Scattered  shotty mediastinal lymph nodes individually not enlarged by size criteria with index prevascular lymph node measuring 0.6 cm in diameter. Right hilar adenopathy with index super hilar nodal conglomeration measuring 1.2 cm in diameter. No definite axillary  adenopathy.  Limited early arterial phase evaluation of the upper abdomen is normal.  Stigmata of ankylosing spondylitis within the thoracic and image caudal aspect of the cervical spine. No acute or aggressive osseous abnormalities.  IMPRESSION: 1. The examination is positive for small burden pulmonary emboli to the right lung arterial tree as detailed above. No CT evidence of right-sided heart strain. 2. Near complete consolidation of the left lung with associated moderate sized left sided pleural effusion and multiple enhancing left-sided pleural nodules compatible with metastatic disease. In the absence of prior examinations, the stability of these findings are indeterminate. 3. Two punctate foci of pneumothorax within the left lung, possibly ex vacuo in etiology. 4. Presumed metastatic disease to the contralateral right lung with right hilar lymphadenopathy and indeterminate punctate (approximately 0.7 cm) nodule within the subpleural aspect of the right middle lobe. 5. Advanced emphysematous change with large pneumatocele involving the majority of the superior segment of the right lower lobe. 6. Stigmata of ankylosing spondylitis. No definite acute or aggressive osseus abnormalities.   Electronically Signed   By: Simonne Come M.D.   On: 05/02/2013 20:50   Dg Chest Port 1 View  05/02/2013   CLINICAL DATA:  Pneumonia  EXAM: PORTABLE CHEST - 1 VIEW  COMPARISON:  April 29, 2013  FINDINGS: There is new airspace consolidation in the right upper lobe. Extensive interstitial edema as well as airspace consolidation throughout much of the remainder the left lung is clear. There is interstitial edema on the right with areas of scarring, stable. Heart is enlarged with pulmonary vascularity reflecting underlying emphysema. No adenopathy appreciable.  IMPRESSION: Extensive consolidation on the left, increase, particularly in the left upper lobe. There appears to be underlying congestive heart failure superimposed on  emphysema. The changes on the left are suspicious for a degree of pneumonia, particularly in the left upper lobe.   Electronically Signed   By: Bretta Bang M.D.   On: 05/02/2013 07:29    Scheduled Meds: . aspirin EC  162 mg Oral Daily  . carvedilol  3.125 mg Oral BID WC  .  ceFAZolin (ANCEF) IV  1 g Intravenous Q8H  . cholecalciferol  1,000 Units Oral Daily  . enoxaparin (LOVENOX) injection  1 mg/kg Subcutaneous Q24H  . folic acid  1 mg Oral Daily  . ipratropium  0.5 mg Nebulization QID  . levalbuterol  0.63 mg Nebulization QID  . loratadine  10 mg Oral Daily  . methylPREDNISolone (SOLU-MEDROL) injection  40 mg Intravenous Q12H  . multivitamin with minerals  1 tablet Oral Daily   Continuous Infusions: . sodium chloride 50 mL/hr at 05/02/13 1112    Principal Problem:   Acute-on-chronic respiratory failure Active Problems:   HCAP (healthcare-associated pneumonia)   Lung cancer   Brain metastasis   Anemia, chronic disease   Sepsis   Thrombocytopenia, unspecified    Time spent: 35 minutes. Greater than 50% of this time was spent in direct contact with the patient coordinating care.    Chaya Jan  [facility] Pager [phone]  If 7PM-7AM, please contact night-coverage at [email or web address], password [facility] 05/03/2013, 2:05 PM  LOS: 4 days

## 2013-05-04 LAB — BASIC METABOLIC PANEL
Chloride: 100 mEq/L (ref 96–112)
Creatinine, Ser: 0.99 mg/dL (ref 0.50–1.35)
GFR calc Af Amer: >90 mL/min (ref >90)
Potassium: 4.1 mEq/L (ref 3.5–5.1)
Sodium: 134 mEq/L — ABNORMAL LOW (ref 135–145)

## 2013-05-04 LAB — CBC
HCT: 33.4 % — ABNORMAL LOW (ref 39.0–52.0)
MCH: 33 pg (ref 26.0–34.0)
MCHC: 34.4 g/dL (ref 30.0–36.0)
MCV: 95.7 fL (ref 78.0–100.0)
Platelets: 34 10*3/uL — ABNORMAL LOW (ref 150–400)
RDW: 14.3 % (ref 11.5–15.5)
WBC: 8.3 10*3/uL (ref 4.0–10.5)

## 2013-05-04 MED ORDER — UNABLE TO FIND: Status: DC

## 2013-05-04 NOTE — Progress Notes (Signed)
Patient ID: Luis Payne  male  YNW:[id number]    DOB: [birth year]    DOA: 04/29/2013  PCP: No primary provider on file.  Assessment/Plan: Principal Problem: Acute on Chronic Respiratory Failure  -2/2 HCAP, malignant effusions, lung cancer and now acute PE, on top of ? Gemcitabine-induced pneumonitis.  -Sputum cx with klebsiella, on Ancef.  -Has been started on steroids for potential pneumonitis.  -Not sufficient pleural fluid for thoracentesis.  -OOB to chair, mobilize more, incentive spirometry.   Sepsis  -2/2 HCAP.  -Septic parameters improved.   HCAP  -Abx narrowed to Ancef given respiratory cx data.  -Influenza PCR negative: DC tamiflu, DC droplet precautions.   AOCD  -Hb remains stable at 12.1 s/p 2 units of PRBCs on 12/20.   Chronic Systolic CHF  -Compensated.  -Coreg has been restarted as BP improved.  -Appreciate cards input.  -2D ECHO:  EF 60% to 65%. Wall motion was normal; there were no regional wall motion abnormalities. Doppler parameters are consistent with abnormal left ventricular relaxation (grade 1diastolic dysfunction).  -No further cardiac workup is anticipated at this point.   Thrombocytopenia  -Platelets continue to decrease.  -Watch while on lovenox.   Acute PE  -Continue lovenox at a reduced dose given thrombocytopenia per heme onc recommendations.  Code Status: DNR  DVT Prophylaxis:  Code Status:  Family Communication:  Disposition: When medically stable, home O2 evaluation done today, on 6 L O2, [place]    Subjective:  Feels better, less short of breath, no chest pain, fevers or chills  Objective: Weight change: 0.6 kg (1 lb 5.2 oz)  Intake/Output Summary (Last 24 hours) at 05/04/13 1320 Last data filed at 05/04/13 0900  Gross per 24 hour  Intake 1583.33 ml  Output      0 ml  Net 1583.33 ml   Blood pressure 128/64, pulse 95, temperature 97.5 F (36.4 C), temperature source Oral, resp. rate  22, height 5\' 6"  (1.676 m), weight 59.7 kg (131 lb 9.8 oz), SpO2 90.00%.  Physical Exam: General: Alert and awake, oriented x3, not in any acute distress. CVS: S1-S2 clear, no murmur rubs or gallops Chest: Decreased breath sound at the bases Abdomen: soft nontender, nondistended, normal bowel sounds  Extremities: no cyanosis, clubbing or edema noted bilaterally Neuro: Cranial nerves II-XII intact, no focal neurological deficits  Lab Results: Basic Metabolic Panel:  Recent Labs Lab 05/02/13 0510 05/04/13 0518  NA 133* 134*  K 4.0 4.1  CL 96 100  CO2 26 26  GLUCOSE 99 131*  BUN 22 28*  CREATININE 1.02 0.99  CALCIUM 8.8 9.0   Liver Function Tests: No results found for this basename: AST, ALT, ALKPHOS, BILITOT, PROT, ALBUMIN,  in the last 168 hours No results found for this basename: LIPASE, AMYLASE,  in the last 168 hours No results found for this basename: AMMONIA,  in the last 168 hours CBC:  Recent Labs Lab 05/03/13 0502 05/04/13 0518  WBC 4.6 8.3  NEUTROABS 4.3  --   HGB 12.1* 11.5*  HCT 35.2* 33.4*  MCV 94.4 95.7  PLT 34* 34*   Cardiac Enzymes:  Recent Labs Lab 04/30/13 2129 05/01/13 0241 05/01/13 0915  TROPONINI <0.30 0.41* 0.39*   BNP: No components found with this basename: POCBNP,  CBG: No results found for this basename: GLUCAP,  in the last 168 hours   Micro Results: Recent Results (from the past 240 hour(s))  MRSA PCR SCREENING     Status: None  Collection Time    04/29/13  7:51 PM      Result Value Range Status   MRSA by PCR NEGATIVE  NEGATIVE Final   Comment:            The GeneXpert MRSA Assay (FDA     approved for NASAL specimens     only), is one component of a     comprehensive MRSA colonization     surveillance program. It is not     intended to diagnose MRSA     infection nor to guide or     monitor treatment for     MRSA infections.  CULTURE, BLOOD (ROUTINE X 2)     Status: None   Collection Time    04/29/13  8:52 PM       Result Value Range Status   Specimen Description BLOOD LEFT ARM   Final   Special Requests BOTTLES DRAWN AEROBIC AND ANAEROBIC 4CC   Final   Culture  Setup Time     Final   Value: 04/30/2013 00:39     Performed at [facility]   Culture     Final   Value:        BLOOD CULTURE RECEIVED NO GROWTH TO DATE CULTURE WILL BE HELD FOR 5 DAYS BEFORE ISSUING A FINAL NEGATIVE REPORT     Performed at [facility]   Report Status PENDING   Incomplete  CULTURE, BLOOD (ROUTINE X 2)     Status: None   Collection Time    04/29/13  9:00 PM      Result Value Range Status   Specimen Description BLOOD RIGHT HAND   Final   Special Requests BOTTLES DRAWN AEROBIC ONLY 1CC   Final   Culture  Setup Time     Final   Value: 04/30/2013 00:38     Performed at [facility]   Culture     Final   Value:        BLOOD CULTURE RECEIVED NO GROWTH TO DATE CULTURE WILL BE HELD FOR 5 DAYS BEFORE ISSUING A FINAL NEGATIVE REPORT     Performed at [facility]   Report Status PENDING   Incomplete  CULTURE, EXPECTORATED SPUTUM-ASSESSMENT     Status: None   Collection Time    04/30/13  2:18 AM      Result Value Range Status   Specimen Description SPU   Final   Special Requests NONE   Final   Sputum evaluation     Final   Value: THIS SPECIMEN IS ACCEPTABLE. RESPIRATORY CULTURE REPORT TO FOLLOW.   Report Status 04/30/2013 FINAL   Final  CULTURE, RESPIRATORY (NON-EXPECTORATED)     Status: None   Collection Time    04/30/13  2:18 AM      Result Value Range Status   Specimen Description SPUTUM   Final   Special Requests NONE   Final   Gram Stain     Final   Value: FEW WBC PRESENT, PREDOMINANTLY PMN     NO SQUAMOUS EPITHELIAL CELLS SEEN     NO ORGANISMS SEEN     Performed at [facility]   Culture     Final   Value: FEW KLEBSIELLA SPECIES     Performed at [facility]   Report Status 05/02/2013 FINAL   Final   Organism ID, Bacteria KLEBSIELLA SPECIES   Final     Studies/Results: Ct Angio Chest Pe W/cm &/or Wo Cm  05/02/2013   CLINICAL DATA:  Dyspnea, history of lung cancer and congestive heart failure, evaluate for pulmonary embolism  EXAM: CT ANGIOGRAPHY CHEST WITH CONTRAST  TECHNIQUE: Multidetector CT imaging of the chest was performed using the standard protocol during bolus administration of intravenous contrast. Multiplanar CT image reconstructions including MIPs were obtained to evaluate the vascular anatomy.  CONTRAST:  OMNIPAQUE IOHEXOL 350 MG/ML SOLN  COMPARISON:  Chest radiograph - 05/02/2013; 04/29/2013  FINDINGS: Vascular Findings:  There is adequate opacification of the pulmonary arterial system with the main pulmonary artery measuring 444 Hounsfield units. There are mixed occlusive and nonocclusive filling defects within the right upper (image 94, series 6), middle (image 140) and lower (image 149) lobe segmental bronchi. There is a minimal amount of nonocclusive thrombus within the caudal aspect of the right lower lobe main pulmonary artery (image 140). No discrete filling defects are seen within the pulmonary arterial tree of the left lung. Overall clot burden is deemed small in volume. No definitive enlargement of the caliber the main pulmonary artery. There is CT evidence of right-sided heart strain. There is no reflux of contrast into the hepatic venous system.  Normal heart size. Coronary artery calcifications. No pericardial effusion. Normal caliber the main pulmonary artery. Conventional configuration of the aortic arch. Major branch vessels of the aortic arch widely patent throughout their imaged course. No thoracic aortic dissection or periaortic stranding.  Review of the MIP images confirms the above findings.   ----------------------------------------------------------------------------------  Nonvascular Findings:  There is near complete consolidation of the left lung, most severely involving the left lower lobe and lingula. There  is complete obstruction of the left lower lobe bronchus (representative axial images 30 and 33, series 4). There is rather extensive interstitial thickening within the aerated left upper lobe with scattered air bronchograms both within the left upper and lower lobes. There are two punctate pneumothoraces within the left hemi thorax adjacent to the left lung apex (image 11, series 4) and left mid lung (image 38, series 4).  There is a moderate sized left-sided pleural effusion. Additionally, there are multiple enhancing pleural nodules compatible with metastatic disease (representative axial images 34, 53, and 71).  There is extensive centrilobular and paraseptal emphysematous change within the aerated right lung. There is a large pneumatocele involving the majority of the superior segment of the right lower lobe. Note is made of a punctate (approximately 0.7 x 0.5 cm) subpleural nodule within in the right lower lobe middle lobe (image 62). There is a trace right-sided pleural effusion.  Scattered shotty mediastinal lymph nodes individually not enlarged by size criteria with index prevascular lymph node measuring 0.6 cm in diameter. Right hilar adenopathy with index super hilar nodal conglomeration measuring 1.2 cm in diameter. No definite axillary adenopathy.  Limited early arterial phase evaluation of the upper abdomen is normal.  Stigmata of ankylosing spondylitis within the thoracic and image caudal aspect of the cervical spine. No acute or aggressive osseous abnormalities.  IMPRESSION: 1. The examination is positive for small burden pulmonary emboli to the right lung arterial tree as detailed above. No CT evidence of right-sided heart strain. 2. Near complete consolidation of the left lung with associated moderate sized left sided pleural effusion and multiple enhancing left-sided pleural nodules compatible with metastatic disease. In the absence of prior examinations, the stability of these findings are  indeterminate. 3. Two punctate foci of pneumothorax within the left lung, possibly ex vacuo in etiology. 4. Presumed metastatic disease to the contralateral right  lung with right hilar lymphadenopathy and indeterminate punctate (approximately 0.7 cm) nodule within the subpleural aspect of the right middle lobe. 5. Advanced emphysematous change with large pneumatocele involving the majority of the superior segment of the right lower lobe. 6. Stigmata of ankylosing spondylitis. No definite acute or aggressive osseus abnormalities.   Electronically Signed   By: Simonne Come M.D.   On: 05/02/2013 20:50   [place] Chest  04/29/2013   CLINICAL DATA:  Patient with history of metastatic non-small cell lung carcinoma, dyspnea, recurrent left pleural effusion. Request is made for therapeutic left thoracentesis.  EXAM: CHEST ULTRASOUND  COMPARISON:  Previous thoracentesis on 04/27/2013  FINDINGS: On limited ultrasound of the left posterior chest region today there is minimal free pleural fluid noted. The collection is extensively multi-septated size multiloculated. . Due to these findings therapeutic thoracentesis was not performed at this time.  IMPRESSION: Limited ultrasound left posterior chest today reveals extensive multiloculated/multi-septated collection in pleural space. Minimal free fluid noted. Thoracentesis was not performed at this time. Recommend consultation with thoracic surgery for further evaluation.  Read by: Jeananne Rama ,P.A.-C.   Electronically Signed   By: Irish Lack M.D.   On: 04/29/2013 15:04   Dg Chest Port 1 View  05/02/2013   CLINICAL DATA:  Pneumonia  EXAM: PORTABLE CHEST - 1 VIEW  COMPARISON:  April 29, 2013  FINDINGS: There is new airspace consolidation in the right upper lobe. Extensive interstitial edema as well as airspace consolidation throughout much of the remainder the left lung is clear. There is interstitial edema on the right with areas of scarring, stable. Heart is enlarged  with pulmonary vascularity reflecting underlying emphysema. No adenopathy appreciable.  IMPRESSION: Extensive consolidation on the left, increase, particularly in the left upper lobe. There appears to be underlying congestive heart failure superimposed on emphysema. The changes on the left are suspicious for a degree of pneumonia, particularly in the left upper lobe.   Electronically Signed   By: Bretta Bang M.D.   On: 05/02/2013 07:29   Dg Chest Portable 1 View  04/29/2013   CLINICAL DATA:  Shortness of breath.  EXAM: PORTABLE CHEST - 1 VIEW  COMPARISON:  None.  FINDINGS: Opacity obscures the left heart border and left hemidiaphragm. There is coarse irregular opacity in the left upper lobe extending to the apex. Irregularly thickened bronchovascular/ interstitial markings are noted throughout the aerated left lung and in the right lung base with milder peripheral for interstitial scarring in the right upper lobe.  No pneumothorax. The cardiac silhouette is normal in overall size. The mediastinum is normal in contour.  IMPRESSION: Left lung base opacity is likely combination pleural fluid with either atelectasis, infiltrate or a combination.  Left upper lobe irregular lung opacity is most suggestive of scarring although infiltrate is also possible.  No convincing acute findings in the right lung. There is evidence of COPD.   Electronically Signed   By: Amie Portland M.D.   On: 04/29/2013 12:14    Medications: Scheduled Meds: . aspirin EC  162 mg Oral Daily  . carvedilol  3.125 mg Oral BID WC  . cholecalciferol  1,000 Units Oral Daily  . enoxaparin (LOVENOX) injection  1 mg/kg Subcutaneous Q24H  . folic acid  1 mg Oral Daily  . loratadine  10 mg Oral Daily  . methylPREDNISolone (SOLU-MEDROL) injection  40 mg Intravenous Q12H  . multivitamin with minerals  1 tablet Oral Daily      LOS: 5 days  RAI,RIPUDEEP M.D. [facility] 05/04/2013, 1:20 PM Pager: [phone]  If 7PM-7AM, please  contact night-coverage [email or web address] Password [email or web address]

## 2013-05-04 NOTE — Progress Notes (Signed)
   CARE MANAGEMENT NOTE 05/04/2013  Patient:  Luis, Payne   Account Number:  [id number]  Date Initiated:  05/02/2013  Documentation initiated by:  Lanier Clam  Subjective/Objective Assessment:   72 Y/O M ADMITTED W/RESP FAILURE.WU:JWJX CA.     Action/Plan:   FROM HOME W/SPOUSE.HOME 02-[facility] [phone].   Anticipated DC Date:  05/06/2013   Anticipated DC Plan:  HOME W HOME HEALTH SERVICES      DC Planning Services  CM consult      Choice offered to / List presented to:  C-1 Patient           Status of service:  In process, will continue to follow Medicare Important Message given?   (If response is "NO", the following Medicare IM given date fields will be blank) Date Medicare IM given:   Date Additional Medicare IM given:    Discharge Disposition:    Per UR Regulation:  Reviewed for med. necessity/level of care/duration of stay  If discussed at Long Length of Stay Meetings, dates discussed:    Comments:  05/04/13 [facility] Neria Procter RN,BSN NCM [phone] FAXED 02 SATS, & SCRIPT TO [facility] CINDY W/CONFIRMATION #[phone].BJY#[phone].PATIENT QUALIFIED FOR HOME 02 OF 6L PORTABLE,& 10L CONCENTRATOR.[facility] WILL DELIVER TO HOME ON FRIDAY,CINDY HAS ALREADY SPOKEN TO SPOUSE & INFORMED OF DELIVERY FRIDAY.[facility] HAS BEEN CHOSEN FOR HHC, FOLLOWING IF HHC IS NEEDED.THERE HAS BEEN NO MENTION OF HOSPICE SERVICES, IF NEEDED WOULD NEED NOTED THAT PATIENT QUALIFIES FOR HOSPICE SERVICES,THEN HOME HOSPICE CHOICE REFERRAL,BUT NOT SURE IF PATIENT IS READY FOR THAT LEVEL AT THIS POINT.  05/03/13 Kenton Fortin RN,BSN NCM [phone] 02 SATS CHECKED @ REST ON 4LNC,SATS 97%,DOES NOT QUALIFY FOR INCREASE IN HOME 02 LEVEL.INFORMED CINDY ROGERS(TEL#[phone],SHE WILL BE AVAILABLE TOMORROW TILL 6P CENTRAL TIME THEY ARE 1HR AHEAD OF [place].CURRENTLY HOME 02 PORTABLE TANK HOLDS UP TO 4L,& HOME CONCENTRATOR HOLDS UP TO 5L. TC [facility](HOME 02) [phone],SPOKE TO ANGIE(CUST SERVICE)INFORMED HER OF HOME 02 NEEDS  OF POSSIBLE HIGHER LEVEL 86F HOME 02.FAXED W/CONFIRMATION NFA#[phone] H&P,FACE SHEET,PROGRESS NOTES.RECEIVED CALL FROM CINDY ROGERS(RESP THERAPIST)REQUESTING 02 SATS TO BE CHECKED ON 4L 02.IF 02 SATS BELOW 88%,& W/SCRIPT ON HOME 02 NEEDED THEY CAN DELIVER HOME 02 TO THE HOME BY FRIDAY.WILL FAX RESULTS TO CINDY ROGERS FAX#[phone].YQM#[phone].MD/NURSE NOTIFIED.ALSO INFORMED [facility] DME REP IF HOME 02 NEEDED @ D/C,THEY WOULD BE ABLE TO PROVIDE ALSO.[facility] HHC CHOSEN IF HHC NEEDED,TC [facility] REP INFORMED OF REFERRAL.  05/02/13 Luis Lardizabal RN,BSN NCM [phone] RESP FAILURE.IV ABX,NEBS,INCENTIVE SPIROMETER.IF HHC NEEDED CAN ARRANGE.

## 2013-05-05 DIAGNOSIS — I2699 Other pulmonary embolism without acute cor pulmonale: Secondary | ICD-10-CM

## 2013-05-05 DIAGNOSIS — D696 Thrombocytopenia, unspecified: Secondary | ICD-10-CM

## 2013-05-05 NOTE — Progress Notes (Signed)
Patient ID: Luis Payne  male  ZOX:[id number]    DOB: [birth year]    DOA: 04/29/2013  PCP: No primary provider on file.  Assessment/Plan:  Acute on Chronic Respiratory Failure  -2/2 HCAP, malignant effusions, lung cancer and now acute PE, on top of ? Gemcitabine-induced pneumonitis.  -Sputum cx with klebsiella, on Ancef.  -Has been started on steroids for potential pneumonitis.  -Not sufficient pleural fluid for thoracentesis.  -OOB to chair, mobilize more, incentive spirometry.  -looks much better today.  Sepsis  -2/2 HCAP.  -Septic parameters improved.   HCAP  -Abx narrowed to Ancef given respiratory cx data.  -Influenza PCR negative: DC tamiflu, DC droplet precautions.   AOCD  -Hb remains stable at 12.1 s/p 2 units of PRBCs on 12/20.   Chronic Systolic CHF  -Compensated.  -Coreg has been restarted as BP improved.  -Appreciate cards input.  -2D ECHO:  EF 60% to 65%. Wall motion was normal; there were no regional wall motion abnormalities. Doppler parameters are consistent with abnormal left ventricular relaxation (grade 1diastolic dysfunction).  -No further cardiac workup is anticipated at this point.   Thrombocytopenia  -Platelets stable at 34. -Watch while on lovenox.   Acute PE  -Continue lovenox at a reduced dose given thrombocytopenia per heme onc recommendations.  Code Status: DNR  DVT Prophylaxis: Lovenox  Code Status: DNR  Family Communication: Patient only  Disposition: When medically stable, home O2 evaluation done today, on 6 L O2, [place]    Subjective:  Feels better, less short of breath, no chest pain, fevers or chills. Has been walking down the hallway and using his incentive spirometer  Objective: Weight change: 0.2 kg (7.1 oz)  Intake/Output Summary (Last 24 hours) at 05/05/13 1722 Last data filed at 05/05/13 1500  Gross per 24 hour  Intake 1779.17 ml  Output   2176 ml  Net -396.83 ml   Blood  pressure 132/63, pulse 86, temperature 97.7 F (36.5 C), temperature source Oral, resp. rate 20, height 5\' 6"  (1.676 m), weight 59.9 kg (132 lb 0.9 oz), SpO2 97.00%.  Physical Exam: General: Alert and awake, oriented x3, not in any acute distress. CVS: S1-S2 clear, no murmur rubs or gallops Chest: Decreased breath sound at the bases Abdomen: soft nontender, nondistended, normal bowel sounds  Extremities: no cyanosis, clubbing or edema noted bilaterally Neuro: Cranial nerves II-XII intact, no focal neurological deficits  Lab Results: Basic Metabolic Panel:  Recent Labs Lab 05/02/13 0510 05/04/13 0518  NA 133* 134*  K 4.0 4.1  CL 96 100  CO2 26 26  GLUCOSE 99 131*  BUN 22 28*  CREATININE 1.02 0.99  CALCIUM 8.8 9.0   Liver Function Tests: No results found for this basename: AST, ALT, ALKPHOS, BILITOT, PROT, ALBUMIN,  in the last 168 hours No results found for this basename: LIPASE, AMYLASE,  in the last 168 hours No results found for this basename: AMMONIA,  in the last 168 hours CBC:  Recent Labs Lab 05/03/13 0502 05/04/13 0518  WBC 4.6 8.3  NEUTROABS 4.3  --   HGB 12.1* 11.5*  HCT 35.2* 33.4*  MCV 94.4 95.7  PLT 34* 34*   Cardiac Enzymes:  Recent Labs Lab 04/30/13 2129 05/01/13 0241 05/01/13 0915  TROPONINI <0.30 0.41* 0.39*   BNP: No components found with this basename: POCBNP,  CBG: No results found for this basename: GLUCAP,  in the last 168 hours   Micro Results: Recent Results (from the past 240  hour(s))  MRSA PCR SCREENING     Status: None   Collection Time    04/29/13  7:51 PM      Result Value Range Status   MRSA by PCR NEGATIVE  NEGATIVE Final   Comment:            The GeneXpert MRSA Assay (FDA     approved for NASAL specimens     only), is one component of a     comprehensive MRSA colonization     surveillance program. It is not     intended to diagnose MRSA     infection nor to guide or     monitor treatment for     MRSA infections.   CULTURE, BLOOD (ROUTINE X 2)     Status: None   Collection Time    04/29/13  8:52 PM      Result Value Range Status   Specimen Description BLOOD LEFT ARM   Final   Special Requests BOTTLES DRAWN AEROBIC AND ANAEROBIC 4CC   Final   Culture  Setup Time     Final   Value: 04/30/2013 00:39     Performed at [facility]   Culture     Final   Value:        BLOOD CULTURE RECEIVED NO GROWTH TO DATE CULTURE WILL BE HELD FOR 5 DAYS BEFORE ISSUING A FINAL NEGATIVE REPORT     Performed at [facility]   Report Status PENDING   Incomplete  CULTURE, BLOOD (ROUTINE X 2)     Status: None   Collection Time    04/29/13  9:00 PM      Result Value Range Status   Specimen Description BLOOD RIGHT HAND   Final   Special Requests BOTTLES DRAWN AEROBIC ONLY 1CC   Final   Culture  Setup Time     Final   Value: 04/30/2013 00:38     Performed at [facility]   Culture     Final   Value:        BLOOD CULTURE RECEIVED NO GROWTH TO DATE CULTURE WILL BE HELD FOR 5 DAYS BEFORE ISSUING A FINAL NEGATIVE REPORT     Performed at [facility]   Report Status PENDING   Incomplete  CULTURE, EXPECTORATED SPUTUM-ASSESSMENT     Status: None   Collection Time    04/30/13  2:18 AM      Result Value Range Status   Specimen Description SPU   Final   Special Requests NONE   Final   Sputum evaluation     Final   Value: THIS SPECIMEN IS ACCEPTABLE. RESPIRATORY CULTURE REPORT TO FOLLOW.   Report Status 04/30/2013 FINAL   Final  CULTURE, RESPIRATORY (NON-EXPECTORATED)     Status: None   Collection Time    04/30/13  2:18 AM      Result Value Range Status   Specimen Description SPUTUM   Final   Special Requests NONE   Final   Gram Stain     Final   Value: FEW WBC PRESENT, PREDOMINANTLY PMN     NO SQUAMOUS EPITHELIAL CELLS SEEN     NO ORGANISMS SEEN     Performed at [facility]   Culture     Final   Value: FEW KLEBSIELLA SPECIES     Performed at [facility]   Report  Status 05/02/2013 FINAL   Final   Organism ID, Bacteria KLEBSIELLA SPECIES   Final  Studies/Results: Ct Angio Chest Pe W/cm &/or Wo Cm  05/02/2013   CLINICAL DATA:  Dyspnea, history of lung cancer and congestive heart failure, evaluate for pulmonary embolism  EXAM: CT ANGIOGRAPHY CHEST WITH CONTRAST  TECHNIQUE: Multidetector CT imaging of the chest was performed using the standard protocol during bolus administration of intravenous contrast. Multiplanar CT image reconstructions including MIPs were obtained to evaluate the vascular anatomy.  CONTRAST:  OMNIPAQUE IOHEXOL 350 MG/ML SOLN  COMPARISON:  Chest radiograph - 05/02/2013; 04/29/2013  FINDINGS: Vascular Findings:  There is adequate opacification of the pulmonary arterial system with the main pulmonary artery measuring 444 Hounsfield units. There are mixed occlusive and nonocclusive filling defects within the right upper (image 94, series 6), middle (image 140) and lower (image 149) lobe segmental bronchi. There is a minimal amount of nonocclusive thrombus within the caudal aspect of the right lower lobe main pulmonary artery (image 140). No discrete filling defects are seen within the pulmonary arterial tree of the left lung. Overall clot burden is deemed small in volume. No definitive enlargement of the caliber the main pulmonary artery. There is CT evidence of right-sided heart strain. There is no reflux of contrast into the hepatic venous system.  Normal heart size. Coronary artery calcifications. No pericardial effusion. Normal caliber the main pulmonary artery. Conventional configuration of the aortic arch. Major branch vessels of the aortic arch widely patent throughout their imaged course. No thoracic aortic dissection or periaortic stranding.  Review of the MIP images confirms the above findings.   ----------------------------------------------------------------------------------  Nonvascular Findings:  There is near complete  consolidation of the left lung, most severely involving the left lower lobe and lingula. There is complete obstruction of the left lower lobe bronchus (representative axial images 30 and 33, series 4). There is rather extensive interstitial thickening within the aerated left upper lobe with scattered air bronchograms both within the left upper and lower lobes. There are two punctate pneumothoraces within the left hemi thorax adjacent to the left lung apex (image 11, series 4) and left mid lung (image 38, series 4).  There is a moderate sized left-sided pleural effusion. Additionally, there are multiple enhancing pleural nodules compatible with metastatic disease (representative axial images 34, 53, and 71).  There is extensive centrilobular and paraseptal emphysematous change within the aerated right lung. There is a large pneumatocele involving the majority of the superior segment of the right lower lobe. Note is made of a punctate (approximately 0.7 x 0.5 cm) subpleural nodule within in the right lower lobe middle lobe (image 62). There is a trace right-sided pleural effusion.  Scattered shotty mediastinal lymph nodes individually not enlarged by size criteria with index prevascular lymph node measuring 0.6 cm in diameter. Right hilar adenopathy with index super hilar nodal conglomeration measuring 1.2 cm in diameter. No definite axillary adenopathy.  Limited early arterial phase evaluation of the upper abdomen is normal.  Stigmata of ankylosing spondylitis within the thoracic and image caudal aspect of the cervical spine. No acute or aggressive osseous abnormalities.  IMPRESSION: 1. The examination is positive for small burden pulmonary emboli to the right lung arterial tree as detailed above. No CT evidence of right-sided heart strain. 2. Near complete consolidation of the left lung with associated moderate sized left sided pleural effusion and multiple enhancing left-sided pleural nodules compatible with  metastatic disease. In the absence of prior examinations, the stability of these findings are indeterminate. 3. Two punctate foci of pneumothorax within the left lung, possibly ex vacuo  in etiology. 4. Presumed metastatic disease to the contralateral right lung with right hilar lymphadenopathy and indeterminate punctate (approximately 0.7 cm) nodule within the subpleural aspect of the right middle lobe. 5. Advanced emphysematous change with large pneumatocele involving the majority of the superior segment of the right lower lobe. 6. Stigmata of ankylosing spondylitis. No definite acute or aggressive osseus abnormalities.   Electronically Signed   By: Simonne Come M.D.   On: 05/02/2013 20:50   [place] Chest  04/29/2013   CLINICAL DATA:  Patient with history of metastatic non-small cell lung carcinoma, dyspnea, recurrent left pleural effusion. Request is made for therapeutic left thoracentesis.  EXAM: CHEST ULTRASOUND  COMPARISON:  Previous thoracentesis on 04/27/2013  FINDINGS: On limited ultrasound of the left posterior chest region today there is minimal free pleural fluid noted. The collection is extensively multi-septated size multiloculated. . Due to these findings therapeutic thoracentesis was not performed at this time.  IMPRESSION: Limited ultrasound left posterior chest today reveals extensive multiloculated/multi-septated collection in pleural space. Minimal free fluid noted. Thoracentesis was not performed at this time. Recommend consultation with thoracic surgery for further evaluation.  Read by: Jeananne Rama ,P.A.-C.   Electronically Signed   By: Irish Lack M.D.   On: 04/29/2013 15:04   Dg Chest Port 1 View  05/02/2013   CLINICAL DATA:  Pneumonia  EXAM: PORTABLE CHEST - 1 VIEW  COMPARISON:  April 29, 2013  FINDINGS: There is new airspace consolidation in the right upper lobe. Extensive interstitial edema as well as airspace consolidation throughout much of the remainder the left lung is clear.  There is interstitial edema on the right with areas of scarring, stable. Heart is enlarged with pulmonary vascularity reflecting underlying emphysema. No adenopathy appreciable.  IMPRESSION: Extensive consolidation on the left, increase, particularly in the left upper lobe. There appears to be underlying congestive heart failure superimposed on emphysema. The changes on the left are suspicious for a degree of pneumonia, particularly in the left upper lobe.   Electronically Signed   By: Bretta Bang M.D.   On: 05/02/2013 07:29   Dg Chest Portable 1 View  04/29/2013   CLINICAL DATA:  Shortness of breath.  EXAM: PORTABLE CHEST - 1 VIEW  COMPARISON:  None.  FINDINGS: Opacity obscures the left heart border and left hemidiaphragm. There is coarse irregular opacity in the left upper lobe extending to the apex. Irregularly thickened bronchovascular/ interstitial markings are noted throughout the aerated left lung and in the right lung base with milder peripheral for interstitial scarring in the right upper lobe.  No pneumothorax. The cardiac silhouette is normal in overall size. The mediastinum is normal in contour.  IMPRESSION: Left lung base opacity is likely combination pleural fluid with either atelectasis, infiltrate or a combination.  Left upper lobe irregular lung opacity is most suggestive of scarring although infiltrate is also possible.  No convincing acute findings in the right lung. There is evidence of COPD.   Electronically Signed   By: Amie Portland M.D.   On: 04/29/2013 12:14    Medications: Scheduled Meds: . aspirin EC  162 mg Oral Daily  . carvedilol  3.125 mg Oral BID WC  . cholecalciferol  1,000 Units Oral Daily  . enoxaparin (LOVENOX) injection  1 mg/kg Subcutaneous Q24H  . folic acid  1 mg Oral Daily  . loratadine  10 mg Oral Daily  . methylPREDNISolone (SOLU-MEDROL) injection  40 mg Intravenous Q12H  . multivitamin with minerals  1 tablet Oral Daily  LOS: 6 days    Chaya Jan M.D. [facility] 05/05/2013, 5:22 PM Pager: [phone]  If 7PM-7AM, please contact night-coverage [email or web address] Password [email or web address]

## 2013-05-06 LAB — CULTURE, BLOOD (ROUTINE X 2)
Culture: NO GROWTH
Culture: NO GROWTH

## 2013-05-06 LAB — CREATININE, SERUM
Creatinine, Ser: 1.03 mg/dL (ref 0.50–1.35)
GFR calc non Af Amer: 69 mL/min — ABNORMAL LOW (ref >90)

## 2013-05-06 MED ORDER — ENOXAPARIN (LOVENOX) PATIENT EDUCATION KIT
PACK | Freq: Once | Status: AC
  Administered 2013-05-06: 15:00:00
  Filled 2013-05-06: qty 1

## 2013-05-06 NOTE — Progress Notes (Signed)
Pt and his wife educated on how to administer lovenox.  Pt and wife verbalized understanding instructions and pt to self administer 2200 dose of lovenox.

## 2013-05-06 NOTE — Progress Notes (Signed)
Patient ID: Luis Payne  male  RUE:[id number]    DOB: Sep 15, 1940    DOA: 04/29/2013  PCP: No primary provider on file.  Assessment/Plan:  Acute on Chronic Respiratory Failure  -2/2 HCAP, malignant effusions, lung cancer and now acute PE, on top of ? Gemcitabine-induced pneumonitis.  -Sputum cx with klebsiella, on Ancef.  -Has been started on steroids for potential pneumonitis.  -Not sufficient pleural fluid for thoracentesis.  -OOB to chair, mobilize more, incentive spirometry.  -looks much better today.  Sepsis  -2/2 HCAP.  -Septic parameters improved.   HCAP  -Abx narrowed to Ancef given respiratory cx data.  -Influenza PCR negative: DC tamiflu, DC droplet precautions.   AOCD  -Hb remains stable at 12.1 s/p 2 units of PRBCs on 12/20.   Chronic Systolic CHF  -Compensated.  -Coreg has been restarted as BP improved.  -Appreciate cards input.  -2D ECHO:  EF 60% to 65%. Wall motion was normal; there were no regional wall motion abnormalities. Doppler parameters are consistent with abnormal left ventricular relaxation (grade 1diastolic dysfunction).  -No further cardiac workup is anticipated at this point.   Thrombocytopenia  -Platelets stable at 34. -Watch while on lovenox.   Acute PE  -Continue lovenox at a reduced dose given thrombocytopenia per heme onc recommendations. -Will need lovenox instruction today in anticipation of DC home in am.  Code Status: DNR  DVT Prophylaxis: Lovenox  Code Status: DNR  Family Communication: Patient only  Disposition: When medically stable, home O2 evaluation done today, on 6 L O2, [place]    Subjective:  Feels better, less short of breath, no chest pain, fevers or chills. Has been walking down the hallway and using his incentive spirometer  Objective: Weight change: 1.336 kg (2 lb 15.1 oz)  Intake/Output Summary (Last 24 hours) at 05/06/13 1645 Last data filed at 05/06/13 1414  Gross  per 24 hour  Intake 1999.17 ml  Output   2078 ml  Net -78.83 ml   Blood pressure 124/70, pulse 95, temperature 98.1 F (36.7 C), temperature source Oral, resp. rate 18, height 5\' 6"  (1.676 m), weight 61.236 kg (135 lb), SpO2 96.00%.  Physical Exam: General: Alert and awake, oriented x3, not in any acute distress. CVS: S1-S2 clear, no murmur rubs or gallops Chest: Decreased breath sound at the bases Abdomen: soft nontender, nondistended, normal bowel sounds  Extremities: no cyanosis, clubbing or edema noted bilaterally Neuro: Cranial nerves II-XII intact, no focal neurological deficits  Lab Results: Basic Metabolic Panel:  Recent Labs Lab 05/02/13 0510 05/04/13 0518 05/06/13 0515  NA 133* 134*  --   K 4.0 4.1  --   CL 96 100  --   CO2 26 26  --   GLUCOSE 99 131*  --   BUN 22 28*  --   CREATININE 1.02 0.99 1.03  CALCIUM 8.8 9.0  --    Liver Function Tests: No results found for this basename: AST, ALT, ALKPHOS, BILITOT, PROT, ALBUMIN,  in the last 168 hours No results found for this basename: LIPASE, AMYLASE,  in the last 168 hours No results found for this basename: AMMONIA,  in the last 168 hours CBC:  Recent Labs Lab 05/03/13 0502 05/04/13 0518  WBC 4.6 8.3  NEUTROABS 4.3  --   HGB 12.1* 11.5*  HCT 35.2* 33.4*  MCV 94.4 95.7  PLT 34* 34*   Cardiac Enzymes:  Recent Labs Lab 04/30/13 2129 05/01/13 0241 05/01/13 0915  TROPONINI <0.30 0.41*  0.39*   BNP: No components found with this basename: POCBNP,  CBG: No results found for this basename: GLUCAP,  in the last 168 hours   Micro Results: Recent Results (from the past 240 hour(s))  MRSA PCR SCREENING     Status: None   Collection Time    04/29/13  7:51 PM      Result Value Range Status   MRSA by PCR NEGATIVE  NEGATIVE Final   Comment:            The GeneXpert MRSA Assay (FDA     approved for NASAL specimens     only), is one component of a     comprehensive MRSA colonization     surveillance  program. It is not     intended to diagnose MRSA     infection nor to guide or     monitor treatment for     MRSA infections.  CULTURE, BLOOD (ROUTINE X 2)     Status: None   Collection Time    04/29/13  8:52 PM      Result Value Range Status   Specimen Description BLOOD LEFT ARM   Final   Special Requests BOTTLES DRAWN AEROBIC AND ANAEROBIC 4CC   Final   Culture  Setup Time     Final   Value: 04/30/2013 00:39     Performed at [facility]   Culture     Final   Value: NO GROWTH 5 DAYS     Performed at [facility]   Report Status 05/06/2013 FINAL   Final  CULTURE, BLOOD (ROUTINE X 2)     Status: None   Collection Time    04/29/13  9:00 PM      Result Value Range Status   Specimen Description BLOOD RIGHT HAND   Final   Special Requests BOTTLES DRAWN AEROBIC ONLY 1CC   Final   Culture  Setup Time     Final   Value: 04/30/2013 00:38     Performed at [facility]   Culture     Final   Value: NO GROWTH 5 DAYS     Performed at [facility]   Report Status 05/06/2013 FINAL   Final  CULTURE, EXPECTORATED SPUTUM-ASSESSMENT     Status: None   Collection Time    04/30/13  2:18 AM      Result Value Range Status   Specimen Description SPU   Final   Special Requests NONE   Final   Sputum evaluation     Final   Value: THIS SPECIMEN IS ACCEPTABLE. RESPIRATORY CULTURE REPORT TO FOLLOW.   Report Status 04/30/2013 FINAL   Final  CULTURE, RESPIRATORY (NON-EXPECTORATED)     Status: None   Collection Time    04/30/13  2:18 AM      Result Value Range Status   Specimen Description SPUTUM   Final   Special Requests NONE   Final   Gram Stain     Final   Value: FEW WBC PRESENT, PREDOMINANTLY PMN     NO SQUAMOUS EPITHELIAL CELLS SEEN     NO ORGANISMS SEEN     Performed at [facility]   Culture     Final   Value: FEW KLEBSIELLA SPECIES     Performed at [facility]   Report Status 05/02/2013 FINAL   Final   Organism ID, Bacteria KLEBSIELLA  SPECIES   Final    Studies/Results: Ct Angio Chest Pe  W/cm &/or Wo Cm  05/02/2013   CLINICAL DATA:  Dyspnea, history of lung cancer and congestive heart failure, evaluate for pulmonary embolism  EXAM: CT ANGIOGRAPHY CHEST WITH CONTRAST  TECHNIQUE: Multidetector CT imaging of the chest was performed using the standard protocol during bolus administration of intravenous contrast. Multiplanar CT image reconstructions including MIPs were obtained to evaluate the vascular anatomy.  CONTRAST:  OMNIPAQUE IOHEXOL 350 MG/ML SOLN  COMPARISON:  Chest radiograph - 05/02/2013; 04/29/2013  FINDINGS: Vascular Findings:  There is adequate opacification of the pulmonary arterial system with the main pulmonary artery measuring 444 Hounsfield units. There are mixed occlusive and nonocclusive filling defects within the right upper (image 94, series 6), middle (image 140) and lower (image 149) lobe segmental bronchi. There is a minimal amount of nonocclusive thrombus within the caudal aspect of the right lower lobe main pulmonary artery (image 140). No discrete filling defects are seen within the pulmonary arterial tree of the left lung. Overall clot burden is deemed small in volume. No definitive enlargement of the caliber the main pulmonary artery. There is CT evidence of right-sided heart strain. There is no reflux of contrast into the hepatic venous system.  Normal heart size. Coronary artery calcifications. No pericardial effusion. Normal caliber the main pulmonary artery. Conventional configuration of the aortic arch. Major branch vessels of the aortic arch widely patent throughout their imaged course. No thoracic aortic dissection or periaortic stranding.  Review of the MIP images confirms the above findings.   ----------------------------------------------------------------------------------  Nonvascular Findings:  There is near complete consolidation of the left lung, most severely involving the left lower lobe and  lingula. There is complete obstruction of the left lower lobe bronchus (representative axial images 30 and 33, series 4). There is rather extensive interstitial thickening within the aerated left upper lobe with scattered air bronchograms both within the left upper and lower lobes. There are two punctate pneumothoraces within the left hemi thorax adjacent to the left lung apex (image 11, series 4) and left mid lung (image 38, series 4).  There is a moderate sized left-sided pleural effusion. Additionally, there are multiple enhancing pleural nodules compatible with metastatic disease (representative axial images 34, 53, and 71).  There is extensive centrilobular and paraseptal emphysematous change within the aerated right lung. There is a large pneumatocele involving the majority of the superior segment of the right lower lobe. Note is made of a punctate (approximately 0.7 x 0.5 cm) subpleural nodule within in the right lower lobe middle lobe (image 62). There is a trace right-sided pleural effusion.  Scattered shotty mediastinal lymph nodes individually not enlarged by size criteria with index prevascular lymph node measuring 0.6 cm in diameter. Right hilar adenopathy with index super hilar nodal conglomeration measuring 1.2 cm in diameter. No definite axillary adenopathy.  Limited early arterial phase evaluation of the upper abdomen is normal.  Stigmata of ankylosing spondylitis within the thoracic and image caudal aspect of the cervical spine. No acute or aggressive osseous abnormalities.  IMPRESSION: 1. The examination is positive for small burden pulmonary emboli to the right lung arterial tree as detailed above. No CT evidence of right-sided heart strain. 2. Near complete consolidation of the left lung with associated moderate sized left sided pleural effusion and multiple enhancing left-sided pleural nodules compatible with metastatic disease. In the absence of prior examinations, the stability of these  findings are indeterminate. 3. Two punctate foci of pneumothorax within the left lung, possibly ex vacuo in etiology. 4. Presumed metastatic  disease to the contralateral right lung with right hilar lymphadenopathy and indeterminate punctate (approximately 0.7 cm) nodule within the subpleural aspect of the right middle lobe. 5. Advanced emphysematous change with large pneumatocele involving the majority of the superior segment of the right lower lobe. 6. Stigmata of ankylosing spondylitis. No definite acute or aggressive osseus abnormalities.   Electronically Signed   By: Simonne Come M.D.   On: 05/02/2013 20:50   [place] Chest  04/29/2013   CLINICAL DATA:  Patient with history of metastatic non-small cell lung carcinoma, dyspnea, recurrent left pleural effusion. Request is made for therapeutic left thoracentesis.  EXAM: CHEST ULTRASOUND  COMPARISON:  Previous thoracentesis on 04/27/2013  FINDINGS: On limited ultrasound of the left posterior chest region today there is minimal free pleural fluid noted. The collection is extensively multi-septated size multiloculated. . Due to these findings therapeutic thoracentesis was not performed at this time.  IMPRESSION: Limited ultrasound left posterior chest today reveals extensive multiloculated/multi-septated collection in pleural space. Minimal free fluid noted. Thoracentesis was not performed at this time. Recommend consultation with thoracic surgery for further evaluation.  Read by: Jeananne Rama ,P.A.-C.   Electronically Signed   By: Irish Lack M.D.   On: 04/29/2013 15:04   Dg Chest Port 1 View  05/02/2013   CLINICAL DATA:  Pneumonia  EXAM: PORTABLE CHEST - 1 VIEW  COMPARISON:  April 29, 2013  FINDINGS: There is new airspace consolidation in the right upper lobe. Extensive interstitial edema as well as airspace consolidation throughout much of the remainder the left lung is clear. There is interstitial edema on the right with areas of scarring, stable. Heart is  enlarged with pulmonary vascularity reflecting underlying emphysema. No adenopathy appreciable.  IMPRESSION: Extensive consolidation on the left, increase, particularly in the left upper lobe. There appears to be underlying congestive heart failure superimposed on emphysema. The changes on the left are suspicious for a degree of pneumonia, particularly in the left upper lobe.   Electronically Signed   By: Bretta Bang M.D.   On: 05/02/2013 07:29   Dg Chest Portable 1 View  04/29/2013   CLINICAL DATA:  Shortness of breath.  EXAM: PORTABLE CHEST - 1 VIEW  COMPARISON:  None.  FINDINGS: Opacity obscures the left heart border and left hemidiaphragm. There is coarse irregular opacity in the left upper lobe extending to the apex. Irregularly thickened bronchovascular/ interstitial markings are noted throughout the aerated left lung and in the right lung base with milder peripheral for interstitial scarring in the right upper lobe.  No pneumothorax. The cardiac silhouette is normal in overall size. The mediastinum is normal in contour.  IMPRESSION: Left lung base opacity is likely combination pleural fluid with either atelectasis, infiltrate or a combination.  Left upper lobe irregular lung opacity is most suggestive of scarring although infiltrate is also possible.  No convincing acute findings in the right lung. There is evidence of COPD.   Electronically Signed   By: Amie Portland M.D.   On: 04/29/2013 12:14    Medications: Scheduled Meds: . aspirin EC  162 mg Oral Daily  . carvedilol  3.125 mg Oral BID WC  . cholecalciferol  1,000 Units Oral Daily  . enoxaparin (LOVENOX) injection  1 mg/kg Subcutaneous Q24H  . folic acid  1 mg Oral Daily  . loratadine  10 mg Oral Daily  . methylPREDNISolone (SOLU-MEDROL) injection  40 mg Intravenous Q12H  . multivitamin with minerals  1 tablet Oral Daily  LOS: 7 days   Chaya Jan M.D. [facility] 05/06/2013, 4:45 PM Pager:  [phone]  If 7PM-7AM, please contact night-coverage [email or web address] Password [email or web address]

## 2013-05-07 MED ORDER — PREDNISONE 10 MG PO TABS: 10.0000 mg | ORAL_TABLET | Freq: Every day | ORAL | Status: DC

## 2013-05-07 MED ORDER — OXYCODONE HCL 5 MG PO TABS: 5.0000 mg | ORAL_TABLET | ORAL | Status: DC | PRN

## 2013-05-07 MED ORDER — ENOXAPARIN SODIUM 60 MG/0.6ML ~~LOC~~ SOLN: 1.0000 mg/kg | SUBCUTANEOUS | Status: DC

## 2013-05-07 NOTE — Progress Notes (Deleted)
Pt self-administered Lovenox injection due at 2200. Wife also watched. Pt tolerated very well. Pt verbalized that he feels comfortable self-administrating his daily injections. Will continue to follow-up. Fraser Din, RN

## 2013-05-07 NOTE — Progress Notes (Signed)
Pt self-administered Lovenox injection due at 2200. Wife also watched. Pt tolerated very well. Pt verbalized that he feels comfortable self-administrating his daily injections. Will continue to follow-up. Margrit Minner C, RN  

## 2013-05-07 NOTE — Discharge Summary (Signed)
Physician Discharge Summary  JOHNEL YIELDING HYQ:[id number] DOB: 10/28/1940 DOA: 04/29/2013  PCP: No primary provider on file.  Admit date: 04/29/2013 Discharge date: 05/07/2013  Time spent: 45 minutes  Recommendations for Outpatient Follow-up:  -Will be discharged home today. -Will be on SQ lovenox for the time being. -Will need to have his plt followed given his severe thrombocytopenia and use of lovenox for his PE. -Advised to follow up with Dr. Arbutus Ped as scheduled or in 2 weeks.   Discharge Diagnoses:  Principal Problem:   Acute-on-chronic respiratory failure Active Problems:   HCAP (healthcare-associated pneumonia)   Lung cancer   Brain metastasis   Anemia, chronic disease   Sepsis   Thrombocytopenia, unspecified   Acute pulmonary embolism   Discharge Condition: Stable and improved  Filed Weights   05/05/13 0630 05/06/13 0604 05/07/13 0500  Weight: 59.9 kg (132 lb 0.9 oz) 61.236 kg (135 lb) 61.1 kg (134 lb 11.2 oz)    History of present illness:  Patient is a very pleasant 72 year old white man with a history of lung cancer with brain metastases that has progressed despite chemotherapy and radiation, he is hard of hearing and wears hearing aids, he recently has had 2 thoracenteses for malignant pleural effusions. He states that today he had progressive shortness of breath had to increase his oxygen from his usual of 3-4 to about 5 at home without result. His wife brought him into the hospital because they thought he probably had more fluid in his lungs to be drained. On arrival he was found to be hypotensive with blood pressures in the 80s over 40s. He has been afebrile, chest x-ray shows a left lung base opacity that is likely a combination of pleural fluid with atelectasis infiltrate or combination. There is also a left upper lobe irregular opacity most adjustable scarring although infiltrate is also possible. He was sent to interventional radiology for another possible  thoracenteses, however there was no free-flowing, layering effusion and as such that was aborted. His O2 sats were 80% on 5 L on arrival and have improved to 100% on a nonrebreather. We have been asked to admit him for further evaluation and management. He is a DO NOT RESUSCITATE.    Hospital Course:   Acute on Chronic Respiratory Failure  -2/2 HCAP, malignant effusions, lung cancer and now acute PE, on top of ? Gemcitabine-induced pneumonitis.  -Sputum cx with klebsiella. -Has completed treatment for PNA. -Has been started on steroids for potential pneumonitis and will be discharged on prednisone 10 mg daily. Continued steroid use will need to be determined by his oncologist on follow up visit. -Not sufficient pleural fluid for thoracentesis.    Sepsis  -2/2 HCAP.  -Septic parameters improved.   HCAP  -Abx narrowed to Ancef given respiratory cx data. Has now completed Rx for his PNA. -Influenza PCR negative: DC tamiflu, DC droplet precautions.   AOCD  -Hb remains stable at 12.1 s/p 2 units of PRBCs on 12/20.   Chronic Systolic CHF  -Compensated.  -Coreg has been restarted as BP improved.  -Appreciate cards input.  -2D ECHO: EF 60% to 65%. Wall motion was normal; there were no regional wall motion abnormalities. Doppler parameters are consistent with abnormal left ventricular relaxation (grade 1diastolic dysfunction).  -No further cardiac workup is anticipated at this point.   Thrombocytopenia  -Platelets stable at 34.  -Watch while on lovenox.   Acute PE  -Continue lovenox at a reduced dose given thrombocytopenia per heme onc recommendations. (1mg /kg). -  Will need lovenox instruction today in anticipation of DC home in am.    Procedures:  None   Consultations:  Oncology  Discharge Instructions  Discharge Orders   Future Orders Complete By Expires   Discontinue IV  As directed    Increase activity slowly  As directed        Medication List    STOP taking  these medications       furosemide 20 MG tablet  Commonly known as:  LASIX     lisinopril 2.5 MG tablet  Commonly known as:  PRINIVIL,ZESTRIL     PRESCRIPTION MEDICATION      TAKE these medications       carvedilol 3.125 MG tablet  Commonly known as:  COREG  Take 3.125 mg by mouth 2 (two) times daily with a meal.     cetirizine 10 MG tablet  Commonly known as:  ZYRTEC  Take 10 mg by mouth daily.     cholecalciferol 1000 UNITS tablet  Commonly known as:  VITAMIN D  Take 1,000 Units by mouth daily.     digoxin 0.125 MG tablet  Commonly known as:  LANOXIN  Take 0.125 mg by mouth daily.     enoxaparin 60 MG/0.6ML injection  Commonly known as:  LOVENOX  Inject 0.6 mLs (60 mg total) into the skin daily.     folic acid 1 MG tablet  Commonly known as:  FOLVITE  Take 1 mg by mouth daily.     multivitamin with minerals Tabs tablet  Take 1 tablet by mouth daily.     oxyCODONE 5 MG immediate release tablet  Commonly known as:  Oxy IR/ROXICODONE  Take 1 tablet (5 mg total) by mouth every 4 (four) hours as needed for severe pain.     predniSONE 10 MG tablet  Commonly known as:  DELTASONE  Take 1 tablet (10 mg total) by mouth daily with breakfast.     prochlorperazine 10 MG tablet  Commonly known as:  COMPAZINE  Take 10 mg by mouth every 6 (six) hours as needed for nausea or vomiting.     UNABLE TO FIND  - Home Oxygen   -   - 6 liters continuous for acute on chronic respiratory failure  -   - Diagnosis: lung cancer, pulmonary embolism       No Known Allergies     Follow-up Information   Schedule an appointment as soon as possible for a visit in 2 weeks to follow up. (With your regular provider)        The results of significant diagnostics from this hospitalization (including imaging, microbiology, ancillary and laboratory) are listed below for reference.    Significant Diagnostic Studies: Ct Angio Chest Pe W/cm &/or Wo Cm  05/02/2013   CLINICAL DATA:   Dyspnea, history of lung cancer and congestive heart failure, evaluate for pulmonary embolism  EXAM: CT ANGIOGRAPHY CHEST WITH CONTRAST  TECHNIQUE: Multidetector CT imaging of the chest was performed using the standard protocol during bolus administration of intravenous contrast. Multiplanar CT image reconstructions including MIPs were obtained to evaluate the vascular anatomy.  CONTRAST:  OMNIPAQUE IOHEXOL 350 MG/ML SOLN  COMPARISON:  Chest radiograph - 05/02/2013; 04/29/2013  FINDINGS: Vascular Findings:  There is adequate opacification of the pulmonary arterial system with the main pulmonary artery measuring 444 Hounsfield units. There are mixed occlusive and nonocclusive filling defects within the right upper (image 94, series 6), middle (image 140) and lower (image 149) lobe segmental bronchi.  There is a minimal amount of nonocclusive thrombus within the caudal aspect of the right lower lobe main pulmonary artery (image 140). No discrete filling defects are seen within the pulmonary arterial tree of the left lung. Overall clot burden is deemed small in volume. No definitive enlargement of the caliber the main pulmonary artery. There is CT evidence of right-sided heart strain. There is no reflux of contrast into the hepatic venous system.  Normal heart size. Coronary artery calcifications. No pericardial effusion. Normal caliber the main pulmonary artery. Conventional configuration of the aortic arch. Major branch vessels of the aortic arch widely patent throughout their imaged course. No thoracic aortic dissection or periaortic stranding.  Review of the MIP images confirms the above findings.   ----------------------------------------------------------------------------------  Nonvascular Findings:  There is near complete consolidation of the left lung, most severely involving the left lower lobe and lingula. There is complete obstruction of the left lower lobe bronchus (representative axial images 30 and  33, series 4). There is rather extensive interstitial thickening within the aerated left upper lobe with scattered air bronchograms both within the left upper and lower lobes. There are two punctate pneumothoraces within the left hemi thorax adjacent to the left lung apex (image 11, series 4) and left mid lung (image 38, series 4).  There is a moderate sized left-sided pleural effusion. Additionally, there are multiple enhancing pleural nodules compatible with metastatic disease (representative axial images 34, 53, and 71).  There is extensive centrilobular and paraseptal emphysematous change within the aerated right lung. There is a large pneumatocele involving the majority of the superior segment of the right lower lobe. Note is made of a punctate (approximately 0.7 x 0.5 cm) subpleural nodule within in the right lower lobe middle lobe (image 62). There is a trace right-sided pleural effusion.  Scattered shotty mediastinal lymph nodes individually not enlarged by size criteria with index prevascular lymph node measuring 0.6 cm in diameter. Right hilar adenopathy with index super hilar nodal conglomeration measuring 1.2 cm in diameter. No definite axillary adenopathy.  Limited early arterial phase evaluation of the upper abdomen is normal.  Stigmata of ankylosing spondylitis within the thoracic and image caudal aspect of the cervical spine. No acute or aggressive osseous abnormalities.  IMPRESSION: 1. The examination is positive for small burden pulmonary emboli to the right lung arterial tree as detailed above. No CT evidence of right-sided heart strain. 2. Near complete consolidation of the left lung with associated moderate sized left sided pleural effusion and multiple enhancing left-sided pleural nodules compatible with metastatic disease. In the absence of prior examinations, the stability of these findings are indeterminate. 3. Two punctate foci of pneumothorax within the left lung, possibly ex vacuo in  etiology. 4. Presumed metastatic disease to the contralateral right lung with right hilar lymphadenopathy and indeterminate punctate (approximately 0.7 cm) nodule within the subpleural aspect of the right middle lobe. 5. Advanced emphysematous change with large pneumatocele involving the majority of the superior segment of the right lower lobe. 6. Stigmata of ankylosing spondylitis. No definite acute or aggressive osseus abnormalities.   Electronically Signed   By: Simonne Come M.D.   On: 05/02/2013 20:50   [place] Chest  04/29/2013   CLINICAL DATA:  Patient with history of metastatic non-small cell lung carcinoma, dyspnea, recurrent left pleural effusion. Request is made for therapeutic left thoracentesis.  EXAM: CHEST ULTRASOUND  COMPARISON:  Previous thoracentesis on 04/27/2013  FINDINGS: On limited ultrasound of the left posterior chest region today there is  minimal free pleural fluid noted. The collection is extensively multi-septated size multiloculated. . Due to these findings therapeutic thoracentesis was not performed at this time.  IMPRESSION: Limited ultrasound left posterior chest today reveals extensive multiloculated/multi-septated collection in pleural space. Minimal free fluid noted. Thoracentesis was not performed at this time. Recommend consultation with thoracic surgery for further evaluation.  Read by: Jeananne Rama ,P.A.-C.   Electronically Signed   By: Irish Lack M.D.   On: 04/29/2013 15:04   Dg Chest Port 1 View  05/02/2013   CLINICAL DATA:  Pneumonia  EXAM: PORTABLE CHEST - 1 VIEW  COMPARISON:  April 29, 2013  FINDINGS: There is new airspace consolidation in the right upper lobe. Extensive interstitial edema as well as airspace consolidation throughout much of the remainder the left lung is clear. There is interstitial edema on the right with areas of scarring, stable. Heart is enlarged with pulmonary vascularity reflecting underlying emphysema. No adenopathy appreciable.   IMPRESSION: Extensive consolidation on the left, increase, particularly in the left upper lobe. There appears to be underlying congestive heart failure superimposed on emphysema. The changes on the left are suspicious for a degree of pneumonia, particularly in the left upper lobe.   Electronically Signed   By: Bretta Bang M.D.   On: 05/02/2013 07:29   Dg Chest Portable 1 View  04/29/2013   CLINICAL DATA:  Shortness of breath.  EXAM: PORTABLE CHEST - 1 VIEW  COMPARISON:  None.  FINDINGS: Opacity obscures the left heart border and left hemidiaphragm. There is coarse irregular opacity in the left upper lobe extending to the apex. Irregularly thickened bronchovascular/ interstitial markings are noted throughout the aerated left lung and in the right lung base with milder peripheral for interstitial scarring in the right upper lobe.  No pneumothorax. The cardiac silhouette is normal in overall size. The mediastinum is normal in contour.  IMPRESSION: Left lung base opacity is likely combination pleural fluid with either atelectasis, infiltrate or a combination.  Left upper lobe irregular lung opacity is most suggestive of scarring although infiltrate is also possible.  No convincing acute findings in the right lung. There is evidence of COPD.   Electronically Signed   By: Amie Portland M.D.   On: 04/29/2013 12:14    Microbiology: Recent Results (from the past 240 hour(s))  MRSA PCR SCREENING     Status: None   Collection Time    04/29/13  7:51 PM      Result Value Range Status   MRSA by PCR NEGATIVE  NEGATIVE Final   Comment:            The GeneXpert MRSA Assay (FDA     approved for NASAL specimens     only), is one component of a     comprehensive MRSA colonization     surveillance program. It is not     intended to diagnose MRSA     infection nor to guide or     monitor treatment for     MRSA infections.  CULTURE, BLOOD (ROUTINE X 2)     Status: None   Collection Time    04/29/13  8:52 PM       Result Value Range Status   Specimen Description BLOOD LEFT ARM   Final   Special Requests BOTTLES DRAWN AEROBIC AND ANAEROBIC 4CC   Final   Culture  Setup Time     Final   Value: 04/30/2013 00:39     Performed at [facility]   Culture     Final   Value: NO GROWTH 5 DAYS     Performed at [facility]   Report Status 05/06/2013 FINAL   Final  CULTURE, BLOOD (ROUTINE X 2)     Status: None   Collection Time    04/29/13  9:00 PM      Result Value Range Status   Specimen Description BLOOD RIGHT HAND   Final   Special Requests BOTTLES DRAWN AEROBIC ONLY 1CC   Final   Culture  Setup Time     Final   Value: 04/30/2013 00:38     Performed at [facility]   Culture     Final   Value: NO GROWTH 5 DAYS     Performed at [facility]   Report Status 05/06/2013 FINAL   Final  CULTURE, EXPECTORATED SPUTUM-ASSESSMENT     Status: None   Collection Time    04/30/13  2:18 AM      Result Value Range Status   Specimen Description SPU   Final   Special Requests NONE   Final   Sputum evaluation     Final   Value: THIS SPECIMEN IS ACCEPTABLE. RESPIRATORY CULTURE REPORT TO FOLLOW.   Report Status 04/30/2013 FINAL   Final  CULTURE, RESPIRATORY (NON-EXPECTORATED)     Status: None   Collection Time    04/30/13  2:18 AM      Result Value Range Status   Specimen Description SPUTUM   Final   Special Requests NONE   Final   Gram Stain     Final   Value: FEW WBC PRESENT, PREDOMINANTLY PMN     NO SQUAMOUS EPITHELIAL CELLS SEEN     NO ORGANISMS SEEN     Performed at [facility]   Culture     Final   Value: FEW KLEBSIELLA SPECIES     Performed at [facility]   Report Status 05/02/2013 FINAL   Final   Organism ID, Bacteria KLEBSIELLA SPECIES   Final     Labs: Basic Metabolic Panel:  Recent Labs Lab 05/01/13 0241 05/02/13 0510 05/04/13 0518 05/06/13 0515  NA 133* 133* 134*  --   K 4.1 4.0 4.1  --   CL 99 96 100  --   CO2 29 26 26   --    GLUCOSE 114* 99 131*  --   BUN 24* 22 28*  --   CREATININE 0.98 1.02 0.99 1.03  CALCIUM 8.9 8.8 9.0  --    Liver Function Tests: No results found for this basename: AST, ALT, ALKPHOS, BILITOT, PROT, ALBUMIN,  in the last 168 hours No results found for this basename: LIPASE, AMYLASE,  in the last 168 hours No results found for this basename: AMMONIA,  in the last 168 hours CBC:  Recent Labs Lab 04/30/13 2129 05/01/13 0241 05/02/13 0510 05/03/13 0502 05/04/13 0518  WBC 4.4 4.2 3.9* 4.6 8.3  NEUTROABS  --   --   --  4.3  --   HGB 11.3* 11.5* 11.6* 12.1* 11.5*  HCT 32.5* 33.1* 33.5* 35.2* 33.4*  MCV 93.9 94.0 95.7 94.4 95.7  PLT 82* 74* 48* 34* 34*   Cardiac Enzymes:  Recent Labs Lab 04/30/13 2129 05/01/13 0241 05/01/13 0915  TROPONINI <0.30 0.41* 0.39*   BNP: BNP (last 3 results) No results found for this basename: PROBNP,  in the last 8760 hours CBG: No results found for this basename: GLUCAP,  in the  last 168 hours     Signed:  Chaya Jan  [facility] Pager: [phone] 05/07/2013, 4:21 PM

## 2013-05-08 NOTE — Progress Notes (Signed)
05/07/2013 1300 NCM contacted [facility] in [place] and they have both brand/generic of Lovenox. Isidoro Donning RN CCM Case Mgmt phone [phone]

## 2013-05-09 ENCOUNTER — Other Ambulatory Visit: Payer: Self-pay | Admitting: Radiation Therapy

## 2013-05-09 DIAGNOSIS — C7931 Secondary malignant neoplasm of brain: Secondary | ICD-10-CM

## 2013-05-10 ENCOUNTER — Other Ambulatory Visit (HOSPITAL_BASED_OUTPATIENT_CLINIC_OR_DEPARTMENT_OTHER): Payer: Medicare Other

## 2013-05-10 DIAGNOSIS — C3410 Malignant neoplasm of upper lobe, unspecified bronchus or lung: Secondary | ICD-10-CM

## 2013-05-10 DIAGNOSIS — C3492 Malignant neoplasm of unspecified part of left bronchus or lung: Secondary | ICD-10-CM

## 2013-05-10 LAB — CBC WITH DIFFERENTIAL/PLATELET
Basophils Absolute: 0 10*3/uL (ref 0.0–0.1)
Eosinophils Absolute: 0.1 10*3/uL (ref 0.0–0.5)
HCT: 37.7 % — ABNORMAL LOW (ref 38.4–49.9)
HGB: 12.5 g/dL — ABNORMAL LOW (ref 13.0–17.1)
LYMPH%: 11.7 % — ABNORMAL LOW (ref 14.0–49.0)
MCH: 33.2 pg (ref 27.2–33.4)
MONO#: 0.6 10*3/uL (ref 0.1–0.9)
MONO%: 11.5 % (ref 0.0–14.0)
NEUT#: 3.6 10*3/uL (ref 1.5–6.5)
NEUT%: 75.8 % — ABNORMAL HIGH (ref 39.0–75.0)
Platelets: 148 10*3/uL (ref 140–400)
RBC: 3.77 10*6/uL — ABNORMAL LOW (ref 4.20–5.82)
WBC: 4.8 10*3/uL (ref 4.0–10.3)

## 2013-05-10 LAB — COMPREHENSIVE METABOLIC PANEL (CC13)
ALT: 67 U/L — ABNORMAL HIGH (ref 0–55)
Albumin: 2.4 g/dL — ABNORMAL LOW (ref 3.5–5.0)
Anion Gap: 10 mEq/L (ref 3–11)
BUN: 19.1 mg/dL (ref 7.0–26.0)
CO2: 32 mEq/L — ABNORMAL HIGH (ref 22–29)
Calcium: 9 mg/dL (ref 8.4–10.4)
Chloride: 96 mEq/L — ABNORMAL LOW (ref 98–109)
Creatinine: 0.8 mg/dL (ref 0.7–1.3)
Sodium: 138 mEq/L (ref 136–145)

## 2013-05-13 ENCOUNTER — Telehealth: Payer: Self-pay | Admitting: Medical Oncology

## 2013-05-13 NOTE — Telephone Encounter (Signed)
Note to dr Julien Nordmann.

## 2013-05-16 ENCOUNTER — Telehealth: Payer: Self-pay | Admitting: *Deleted

## 2013-05-16 ENCOUNTER — Telehealth: Payer: Self-pay | Admitting: Internal Medicine

## 2013-05-16 ENCOUNTER — Other Ambulatory Visit: Payer: Self-pay | Admitting: Medical Oncology

## 2013-05-16 ENCOUNTER — Encounter (HOSPITAL_COMMUNITY): Payer: Self-pay | Admitting: Radiology

## 2013-05-16 NOTE — Telephone Encounter (Signed)
Wife called.  Reports Luis Payne was discharged 05-07-2013 after pneumonia.  Does not want to proceed with chemotherapy tomorrow because of recent hospitalization.  Report awaiting a call about a f/u appointment with Dr. Julien Nordmann.  Noted order with this request.  Will notify schedulers of this request.

## 2013-05-16 NOTE — Telephone Encounter (Signed)
ADDED F/U FOR 1/6. S/W WIFE RE NEW TIME FOR LB/MM 1/6 @ 10AM. PER TRIAGE NURSE PT DOES NOT WANT TO DO CHEMO.

## 2013-05-17 ENCOUNTER — Ambulatory Visit: Payer: Medicare Other

## 2013-05-17 ENCOUNTER — Other Ambulatory Visit (HOSPITAL_BASED_OUTPATIENT_CLINIC_OR_DEPARTMENT_OTHER): Payer: Medicare Other

## 2013-05-17 ENCOUNTER — Encounter: Payer: Self-pay | Admitting: *Deleted

## 2013-05-17 ENCOUNTER — Telehealth: Payer: Self-pay | Admitting: Internal Medicine

## 2013-05-17 ENCOUNTER — Ambulatory Visit (HOSPITAL_BASED_OUTPATIENT_CLINIC_OR_DEPARTMENT_OTHER): Payer: Medicare Other | Admitting: Internal Medicine

## 2013-05-17 ENCOUNTER — Encounter: Payer: Self-pay | Admitting: Internal Medicine

## 2013-05-17 DIAGNOSIS — C3492 Malignant neoplasm of unspecified part of left bronchus or lung: Secondary | ICD-10-CM

## 2013-05-17 DIAGNOSIS — C3410 Malignant neoplasm of upper lobe, unspecified bronchus or lung: Secondary | ICD-10-CM

## 2013-05-17 DIAGNOSIS — R5383 Other fatigue: Secondary | ICD-10-CM

## 2013-05-17 DIAGNOSIS — C782 Secondary malignant neoplasm of pleura: Secondary | ICD-10-CM

## 2013-05-17 DIAGNOSIS — R5381 Other malaise: Secondary | ICD-10-CM

## 2013-05-17 DIAGNOSIS — J910 Malignant pleural effusion: Secondary | ICD-10-CM

## 2013-05-17 LAB — COMPREHENSIVE METABOLIC PANEL (CC13)
ALBUMIN: 2.7 g/dL — AB (ref 3.5–5.0)
ALT: 31 U/L (ref 0–55)
ANION GAP: 7 meq/L (ref 3–11)
AST: 24 U/L (ref 5–34)
Alkaline Phosphatase: 116 U/L (ref 40–150)
BUN: 19.4 mg/dL (ref 7.0–26.0)
CALCIUM: 9.7 mg/dL (ref 8.4–10.4)
CHLORIDE: 99 meq/L (ref 98–109)
CO2: 34 mEq/L — ABNORMAL HIGH (ref 22–29)
Creatinine: 1 mg/dL (ref 0.7–1.3)
GLUCOSE: 112 mg/dL (ref 70–140)
POTASSIUM: 4.3 meq/L (ref 3.5–5.1)
Sodium: 140 mEq/L (ref 136–145)
Total Bilirubin: 0.44 mg/dL (ref 0.20–1.20)
Total Protein: 6.8 g/dL (ref 6.4–8.3)

## 2013-05-17 LAB — CBC WITH DIFFERENTIAL/PLATELET
BASO%: 1.2 % (ref 0.0–2.0)
BASOS ABS: 0.1 10*3/uL (ref 0.0–0.1)
EOS%: 0.4 % (ref 0.0–7.0)
Eosinophils Absolute: 0 10*3/uL (ref 0.0–0.5)
HCT: 38.1 % — ABNORMAL LOW (ref 38.4–49.9)
HEMOGLOBIN: 12.8 g/dL — AB (ref 13.0–17.1)
LYMPH#: 0.4 10*3/uL — AB (ref 0.9–3.3)
LYMPH%: 7.4 % — ABNORMAL LOW (ref 14.0–49.0)
MCH: 33.5 pg — ABNORMAL HIGH (ref 27.2–33.4)
MCHC: 33.6 g/dL (ref 32.0–36.0)
MCV: 99.7 fL — AB (ref 79.3–98.0)
MONO#: 0.7 10*3/uL (ref 0.1–0.9)
MONO%: 12.3 % (ref 0.0–14.0)
NEUT#: 4.7 10*3/uL (ref 1.5–6.5)
NEUT%: 78.7 % — ABNORMAL HIGH (ref 39.0–75.0)
Platelets: 222 10*3/uL (ref 140–400)
RBC: 3.82 10*6/uL — ABNORMAL LOW (ref 4.20–5.82)
RDW: 15.9 % — AB (ref 11.0–14.6)
WBC: 6 10*3/uL (ref 4.0–10.3)

## 2013-05-17 NOTE — Progress Notes (Signed)
[place] [phone]) [phone]   Fax:(336) [phone]  OFFICE VISIT PROGRESS NOTE  Luis Herring., Luis Payne [address] [place] [address]  DIAGNOSIS: Metastatic non-small cell lung cancer, adenocarcinoma with negative EGFR mutation and negative ALK gene translocation diagnosed in April 2014   PRIOR THERAPY:  1. Status post stereotactic radiotherapy to 2 brain lesions under the care of Dr. Tammi Klippel.  2. Status post palliative radiotherapy to the left lung mass.  3. Systemic chemotherapy with carboplatin for AUC of 5 and Alimta 500 mg/M2 every 3 weeks, status post 6 cycles. 4. Tarceva 150 mg by mouth daily. Started 02/25/2013. Status post approximately two month of therapy discontinued today secondary to disease progression.  CURRENT THERAPY: Systemic chemotherapy with gemcitabine 1000 mg/M2 on days 1 and 8 every 3 weeks. First dose on 04/26/2013   CHEMOTHERAPY INTENT: Palliative  CURRENT # OF CHEMOTHERAPY CYCLES: 1  CURRENT ANTIEMETICS: Compazine CURRENT SMOKING STATUS: Former smoker, quit 09/28/2012  ORAL CHEMOTHERAPY AND CONSENT: Tarceva and the patient signed consent today.  CURRENT BISPHOSPHONATES USE: none  PAIN MANAGEMENT: Percocet  NARCOTICS INDUCED CONSTIPATION: none  LIVING WILL AND CODE STATUS: No code Blue.  INTERVAL HISTORY: Luis Payne 73 y.o. male returns to the clinic today for followup visit accompanied by his wife. He was recently admitted to [facility] with significant dyspnea with large left pleural effusion and near-complete consolidation of the left lung associated with the left-sided effusion and multiple enhancing left-sided pleural nodules. He underwent ultrasound-guided left thoracentesis with drainage of 950 cc of dark bloody fluid. The patient was also treated for bilateral pneumonia and COPD axis the patient.  He was discharged home on 05/07/2013 and currently on bradycardia zone 10 mg by mouth daily.  His shortness of breath is improved but he continues on home oxygen for COPD. He denied having any significant hemoptysis. He denied having any significant nausea or vomiting. He has no fever or chills. The patient denied having any significant weight loss or night sweats. The patient received only 1 dose of treatment with gemcitabine but this was discontinued after his hospitalization. He still complaining of fatigue and weakness and doesn't think that he is ready to resume any systemic therapy at this point.  MEDICAL HISTORY: Past Medical History  Diagnosis Date  . Substance abuse     TOBACCO  . Lung mass   . 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 Brain metastases 08/18/12    mri-  . HOH (hard of hearing)     wears hearing aids  . COPD (chronic obstructive pulmonary disease)   . Systolic heart failure     EF of 30 to 35% per echo in June 2014  . Lung cancer   . Legionella pneumonia   . CHF (congestive heart failure)     ALLERGIES:  has No Known Allergies.  MEDICATIONS:  Current Outpatient Prescriptions  Medication Sig Dispense Refill  . Ascorbic Acid (VITAMIN C) 1000 MG tablet Take 1,000 mg by mouth daily.      . carvedilol (COREG) 3.125 MG tablet Take 1 tablet (3.125 mg total) by mouth 2 (two) times daily.  180 tablet  3  . carvedilol (COREG) 3.125 MG tablet Take 3.125 mg by mouth 2 (two) times daily with a meal.      . cetirizine (ZYRTEC) 10 MG tablet Take 10 mg by mouth daily.      . cholecalciferol (VITAMIN D) 1000 UNITS tablet  Take 1,000 Units by mouth every morning.      . cholecalciferol (VITAMIN D) 1000 UNITS tablet Take 1,000 Units by mouth daily.      . clindamycin (CLEOCIN T) 1 % lotion Apply twice daily to affected area.  60 mL  1  . digoxin (LANOXIN) 0.125 MG tablet Take 1 tablet (0.125 mg total) by mouth daily.  90 tablet  3  . digoxin (LANOXIN) 0.125 MG tablet Take 0.125 mg by mouth daily.      Marland Kitchen enoxaparin (LOVENOX) 60 MG/0.6ML injection Inject 0.6 mLs (60  mg total) into the skin daily.  90 Syringe  1  . erlotinib (TARCEVA) 150 MG tablet Take 1 tablet (150 mg total) by mouth daily. Take on an empty stomach 1 hour before meals or 2 hours after.  30 tablet  2  . feeding supplement (ENSURE COMPLETE) LIQD Take 237 mLs by mouth 2 (two) times daily between meals.  30 Bottle  5  . folic acid (FOLVITE) 1 MG tablet TAKE 1 TABLET (1 MG TOTAL) BY MOUTH DAILY.  90 tablet  1  . folic acid (FOLVITE) 1 MG tablet Take 1 mg by mouth daily.      . furosemide (LASIX) 20 MG tablet Take 1 tablet (20 mg total) by mouth daily as needed.  30 tablet  3  . guaiFENesin (MUCINEX) 600 MG 12 hr tablet Take 600 mg by mouth daily.      Marland Kitchen levalbuterol (XOPENEX) 0.63 MG/3ML nebulizer solution Take 3 mLs (0.63 mg total) by nebulization every 4 (four) hours as needed for wheezing.  3 mL  120  . lisinopril (PRINIVIL,ZESTRIL) 2.5 MG tablet Take 1 tablet (2.5 mg total) by mouth daily.  90 tablet  3  . Multiple Vitamin (MULTIVITAMIN WITH MINERALS) TABS tablet Take 1 tablet by mouth daily.      . Multiple Vitamin (MULTIVITAMIN WITH MINERALS) TABS Take 1 tablet by mouth every morning.      Marland Kitchen oxyCODONE (OXY IR/ROXICODONE) 5 MG immediate release tablet Take 1 tablet (5 mg total) by mouth every 4 (four) hours as needed for severe pain.  30 tablet  0  . oxyCODONE-acetaminophen (PERCOCET/ROXICET) 5-325 MG per tablet Take 1 tablet by mouth every 6 (six) hours as needed for pain.  90 tablet  0  . OXYGEN-HELIUM IN Inhale 3 L into the lungs daily.      . predniSONE (DELTASONE) 10 MG tablet Take 1 tablet (10 mg total) by mouth daily with breakfast.  30 tablet  1  . prochlorperazine (COMPAZINE) 10 MG tablet Take 1 tablet (10 mg total) by mouth every 6 (six) hours as needed for nausea or vomiting.  30 tablet  0  . prochlorperazine (COMPAZINE) 10 MG tablet Take 10 mg by mouth every 6 (six) hours as needed for nausea or vomiting.      Marland Kitchen UNABLE TO FIND Home Oxygen   6 liters continuous for acute on  chronic respiratory failure  Diagnosis: lung cancer, pulmonary embolism  1 Mutually Defined  0   No current facility-administered medications for this visit.    SURGICAL HISTORY:  Past Surgical History  Procedure Laterality Date  . Wrist surgery    . Lung biopsy Left 08/17/12    LUL lung mass-adenocarcinoma  . Ganglion cyst excision    . Nose surgery      REVIEW OF SYSTEMS:  Constitutional: positive for fatigue Eyes: positive for irritation Ears, nose, mouth, throat, and face: negative Respiratory: positive for dyspnea on exertion  and On supplemental oxygen, 2 L nasal cannula Cardiovascular: negative Gastrointestinal: negative Genitourinary:negative Integument/breast: positive for rash Hematologic/lymphatic: negative Musculoskeletal:negative Neurological: negative Behavioral/Psych: negative Endocrine: negative Allergic/Immunologic: negative   PHYSICAL EXAMINATION: General appearance: alert, cooperative, fatigued and no distress Head: Normocephalic, without obvious abnormality, atraumatic Neck: no adenopathy, no JVD, supple, symmetrical, trachea midline and thyroid not enlarged, symmetric, no tenderness/mass/nodules Lymph nodes: Cervical, supraclavicular, and axillary nodes normal. Resp: wheezes bilaterally Back: symmetric, no curvature. ROM normal. No CVA tenderness. Cardio: regular rate and rhythm, S1, S2 normal, no murmur, click, rub or gallop GI: soft, non-tender; bowel sounds normal; no masses,  no organomegaly Extremities: extremities normal, atraumatic, no cyanosis or edema Neurologic: Alert and oriented X 3, normal strength and tone. Normal symmetric reflexes. Normal coordination and gait Skin: Mild to moderate acneform eruptions over the nose, chin and cheeks, no evidence of superinfection.   ECOG PERFORMANCE STATUS: 2 - Symptomatic, <50% confined to bed  Blood pressure 116/70, pulse 85, temperature 97.2 F (36.2 C), temperature source Oral, resp. rate 17, height  _0  (1.676 m), weight 124 lb 9.6 oz (56.518 kg), SpO2 94.00%, peak flow 3 L/min.  LABORATORY DATA: Lab Results  Component Value Date   WBC 6.0 05/17/2013   HGB 12.8* 05/17/2013   HCT 38.1* 05/17/2013   MCV 99.7* 05/17/2013   PLT 222 05/17/2013      Chemistry      Component Value Date/Time   NA 140 05/17/2013 0958   NA 134* 05/04/2013 0518   K 4.3 05/17/2013 0958   K 4.1 05/04/2013 0518   CL 100 05/04/2013 0518   CL 94* 11/01/2012 1105   CO2 34* 05/17/2013 0958   CO2 26 05/04/2013 0518   BUN 19.4 05/17/2013 0958   BUN 28* 05/04/2013 0518   CREATININE 1.0 05/17/2013 0958   CREATININE 1.03 05/06/2013 0515      Component Value Date/Time   CALCIUM 9.7 05/17/2013 0958   CALCIUM 9.0 05/04/2013 0518   ALKPHOS 116 05/17/2013 0958   ALKPHOS 128* 10/18/2012 0500   AST 24 05/17/2013 0958   AST 40* 10/18/2012 0500   ALT 31 05/17/2013 0958   ALT 34 10/18/2012 0500   BILITOT 0.44 05/17/2013 0958   BILITOT 0.9 10/18/2012 0500       RADIOGRAPHIC STUDIES: Dg Chest 1 View  04/27/2013   CLINICAL DATA:  Left thoracentesis.  Metastatic lung cancer.  EXAM: CHEST - 1 VIEW  COMPARISON:  04/21/2013.  FINDINGS: Trachea is midline. Heart size stable. Left upper lobe mass and surrounding architectural distortion. Moderate left pleural effusion, minimally smaller after thoracentesis. No pneumothorax. Linear densities of the right lung base are unchanged.  IMPRESSION: 1. Left pleural effusion has decreased slightly in size in the interval. No pneumothorax after thoracentesis. 2. Left upper lobe mass and surrounding architectural distortion.   Electronically Signed   By: Lorin Picket M.D.   On: 04/27/2013 14:10   Dg Chest 1 View  04/21/2013   CLINICAL DATA:  Status post left thoracentesis.  Lung cancer.  EXAM: CHEST - 1 VIEW  COMPARISON:  Chest CT 04/18/2013.  Chest radiographs 10/20/2012.  FINDINGS: The cardiac silhouette appears within normal limits for size although the left cardiac contour is partially obscured. When compared  to recent chest CT, left-sided pleural effusion has decreased in size and is now moderate in size. There is left lung volume loss with superior retraction of the hilum. Irregular opacity in the left upper lung is similar to prior CT. Diffusely increased interstitial markings  are present throughout the remainder of both lungs, with linear opacities present in the right lung base, likely unchanged from prior CT. No right-sided pleural effusion is seen. No pneumothorax is identified. No acute osseous abnormality is seen.  IMPRESSION: 1. Moderate left pleural effusion, decreased from prior. No pneumothorax identified. 2. Similar appearance of left upper lung masslike opacity compared to recent CT.   Electronically Signed   By: Logan Bores   On: 04/21/2013 14:16   Ct Chest W Contrast  04/18/2013   CLINICAL DATA:  Followup metastatic non-small cell lung carcinoma. Restaging.  EXAM: CT CHEST, ABDOMEN, AND PELVIS WITH CONTRAST  TECHNIQUE: Multidetector CT imaging of the chest, abdomen and pelvis was performed following the standard protocol during bolus administration of intravenous contrast.  CONTRAST:  158m OMNIPAQUE IOHEXOL 300 MG/ML  SOLN  COMPARISON:  02/10/2013  FINDINGS: CT CHEST FINDINGS  Increased size of moderate left pleural effusion is seen with increased in multiple pleural-based nodules and masses in the left hemi thorax, consistent with malignant pleural effusion. No evidence of right pleural effusion or pericardial effusion.  Severe pulmonary emphysema is again demonstrated. Masslike opacity in the left lung apex is mildly decreased in size, currently measuring 3.6 x 5.3 cm on image 9 compared to 4.0 x 5.5 cm previously. Increased compressive atelectasis is seen in the left lung base related to the pleural effusion. A 7 mm pulmonary nodule in the right middle lobe on image 30 shows mild increase in size from 4 mm on previous study. No other new or enlarging pulmonary nodules identified. No suspicious  bone lesions identified  CT ABDOMEN AND PELVIS FINDINGS  The liver, gallbladder, adrenal glands, spleen, and pancreas are normal in appearance. Nonobstructive renal calculi are again seen bilaterally, however there is no evidence of hydronephrosis or renal masses. No other soft tissue masses or lymphadenopathy identified within the abdomen or pelvis.  Prior hysterectomy noted. Adnexal regions are unremarkable. No evidence of inflammatory process or abnormal fluid collections within the abdomen or pelvis. Small bilateral inguinal hernias containing small bowel are stable. No evidence of bowel obstruction or ischemia no suspicious bone lesions identified.  IMPRESSION: Increased malignant left pleural effusion and pleural based metastatic disease.  Increased size of 7 mm pulmonary nodule in the right middle lobe, suspicious for pulmonary metastasis.  Slight decrease in size of masslike opacity in the left lung apex.  No evidence of abdominal or pelvic metastatic disease.  Stable nonobstructive nephrolithiasis and small bilateral inguinal hernias.   Electronically Signed   By: JEarle GellM.D.   On: 04/18/2013 16:50   Ct Angio Chest Pe W/cm &/or Wo Cm  05/02/2013   CLINICAL DATA:  Dyspnea, history of lung cancer and congestive heart failure, evaluate for pulmonary embolism  EXAM: CT ANGIOGRAPHY CHEST WITH CONTRAST  TECHNIQUE: Multidetector CT imaging of the chest was performed using the standard protocol during bolus administration of intravenous contrast. Multiplanar CT image reconstructions including MIPs were obtained to evaluate the vascular anatomy.  CONTRAST:  1098mOMNIPAQUE IOHEXOL 350 MG/ML SOLN  COMPARISON:  Chest radiograph - 05/02/2013; 04/29/2013  FINDINGS: Vascular Findings:  There is adequate opacification of the pulmonary arterial system with the main pulmonary artery measuring 444 Hounsfield units. There are mixed occlusive and nonocclusive filling defects within the right upper (image 94, series  6), middle (image 140) and lower (image 149) lobe segmental bronchi. There is a minimal amount of nonocclusive thrombus within the caudal aspect of the right lower lobe main pulmonary artery (  image 140). No discrete filling defects are seen within the pulmonary arterial tree of the left lung. Overall clot burden is deemed small in volume. No definitive enlargement of the caliber the main pulmonary artery. There is CT evidence of right-sided heart strain. There is no reflux of contrast into the hepatic venous system.  Normal heart size. Coronary artery calcifications. No pericardial effusion. Normal caliber the main pulmonary artery. Conventional configuration of the aortic arch. Major branch vessels of the aortic arch widely patent throughout their imaged course. No thoracic aortic dissection or periaortic stranding.  Review of the MIP images confirms the above findings.   ----------------------------------------------------------------------------------  Nonvascular Findings:  There is near complete consolidation of the left lung, most severely involving the left lower lobe and lingula. There is complete obstruction of the left lower lobe bronchus (representative axial images 30 and 33, series 4). There is rather extensive interstitial thickening within the aerated left upper lobe with scattered air bronchograms both within the left upper and lower lobes. There are two punctate pneumothoraces within the left hemi thorax adjacent to the left lung apex (image 11, series 4) and left mid lung (image 38, series 4).  There is a moderate sized left-sided pleural effusion. Additionally, there are multiple enhancing pleural nodules compatible with metastatic disease (representative axial images 34, 53, and 71).  There is extensive centrilobular and paraseptal emphysematous change within the aerated right lung. There is a large pneumatocele involving the majority of the superior segment of the right lower lobe. Note is made  of a punctate (approximately 0.7 x 0.5 cm) subpleural nodule within in the right lower lobe middle lobe (image 62). There is a trace right-sided pleural effusion.  Scattered shotty mediastinal lymph nodes individually not enlarged by size criteria with index prevascular lymph node measuring 0.6 cm in diameter. Right hilar adenopathy with index super hilar nodal conglomeration measuring 1.2 cm in diameter. No definite axillary adenopathy.  Limited early arterial phase evaluation of the upper abdomen is normal.  Stigmata of ankylosing spondylitis within the thoracic and image caudal aspect of the cervical spine. No acute or aggressive osseous abnormalities.  IMPRESSION: 1. The examination is positive for small burden pulmonary emboli to the right lung arterial tree as detailed above. No CT evidence of right-sided heart strain. 2. Near complete consolidation of the left lung with associated moderate sized left sided pleural effusion and multiple enhancing left-sided pleural nodules compatible with metastatic disease. In the absence of prior examinations, the stability of these findings are indeterminate. 3. Two punctate foci of pneumothorax within the left lung, possibly ex vacuo in etiology. 4. Presumed metastatic disease to the contralateral right lung with right hilar lymphadenopathy and indeterminate punctate (approximately 0.7 cm) nodule within the subpleural aspect of the right middle lobe. 5. Advanced emphysematous change with large pneumatocele involving the majority of the superior segment of the right lower lobe. 6. Stigmata of ankylosing spondylitis. No definite acute or aggressive osseus abnormalities.   Electronically Signed   By: Sandi Mariscal M.D.   On: 05/02/2013 20:50   [place] Chest  04/29/2013   CLINICAL DATA:  Patient with history of metastatic non-small cell lung carcinoma, dyspnea, recurrent left pleural effusion. Request is made for therapeutic left thoracentesis.  EXAM: CHEST ULTRASOUND   COMPARISON:  Previous thoracentesis on 04/27/2013  FINDINGS: On limited ultrasound of the left posterior chest region today there is minimal free pleural fluid noted. The collection is extensively multi-septated size multiloculated. . Due to these findings therapeutic thoracentesis was  not performed at this time.  IMPRESSION: Limited ultrasound left posterior chest today reveals extensive multiloculated/multi-septated collection in pleural space. Minimal free fluid noted. Thoracentesis was not performed at this time. Recommend consultation with thoracic surgery for further evaluation.  Read by: Rowe Robert ,P.A.-C.   Electronically Signed   By: Aletta Edouard M.D.   On: 04/29/2013 15:04   Ct Abdomen Pelvis W Contrast  04/18/2013   CLINICAL DATA:  Followup metastatic non-small cell lung carcinoma. Restaging.  EXAM: CT CHEST, ABDOMEN, AND PELVIS WITH CONTRAST  TECHNIQUE: Multidetector CT imaging of the chest, abdomen and pelvis was performed following the standard protocol during bolus administration of intravenous contrast.  CONTRAST:  158m OMNIPAQUE IOHEXOL 300 MG/ML  SOLN  COMPARISON:  02/10/2013  FINDINGS: CT CHEST FINDINGS  Increased size of moderate left pleural effusion is seen with increased in multiple pleural-based nodules and masses in the left hemi thorax, consistent with malignant pleural effusion. No evidence of right pleural effusion or pericardial effusion.  Severe pulmonary emphysema is again demonstrated. Masslike opacity in the left lung apex is mildly decreased in size, currently measuring 3.6 x 5.3 cm on image 9 compared to 4.0 x 5.5 cm previously. Increased compressive atelectasis is seen in the left lung base related to the pleural effusion. A 7 mm pulmonary nodule in the right middle lobe on image 30 shows mild increase in size from 4 mm on previous study. No other new or enlarging pulmonary nodules identified. No suspicious bone lesions identified  CT ABDOMEN AND PELVIS FINDINGS  The  liver, gallbladder, adrenal glands, spleen, and pancreas are normal in appearance. Nonobstructive renal calculi are again seen bilaterally, however there is no evidence of hydronephrosis or renal masses. No other soft tissue masses or lymphadenopathy identified within the abdomen or pelvis.  Prior hysterectomy noted. Adnexal regions are unremarkable. No evidence of inflammatory process or abnormal fluid collections within the abdomen or pelvis. Small bilateral inguinal hernias containing small bowel are stable. No evidence of bowel obstruction or ischemia no suspicious bone lesions identified.  IMPRESSION: Increased malignant left pleural effusion and pleural based metastatic disease.  Increased size of 7 mm pulmonary nodule in the right middle lobe, suspicious for pulmonary metastasis.  Slight decrease in size of masslike opacity in the left lung apex.  No evidence of abdominal or pelvic metastatic disease.  Stable nonobstructive nephrolithiasis and small bilateral inguinal hernias.   Electronically Signed   By: JEarle GellM.D.   On: 04/18/2013 16:50   Dg Chest Port 1 View  05/02/2013   CLINICAL DATA:  Pneumonia  EXAM: PORTABLE CHEST - 1 VIEW  COMPARISON:  April 29, 2013  FINDINGS: There is new airspace consolidation in the right upper lobe. Extensive interstitial edema as well as airspace consolidation throughout much of the remainder the left lung is clear. There is interstitial edema on the right with areas of scarring, stable. Heart is enlarged with pulmonary vascularity reflecting underlying emphysema. No adenopathy appreciable.  IMPRESSION: Extensive consolidation on the left, increase, particularly in the left upper lobe. There appears to be underlying congestive heart failure superimposed on emphysema. The changes on the left are suspicious for a degree of pneumonia, particularly in the left upper lobe.   Electronically Signed   By: WLowella GripM.D.   On: 05/02/2013 07:29   Dg Chest Portable  1 View  04/29/2013   CLINICAL DATA:  Shortness of breath.  EXAM: PORTABLE CHEST - 1 VIEW  COMPARISON:  None.  FINDINGS: Opacity obscures  the left heart border and left hemidiaphragm. There is coarse irregular opacity in the left upper lobe extending to the apex. Irregularly thickened bronchovascular/ interstitial markings are noted throughout the aerated left lung and in the right lung base with milder peripheral for interstitial scarring in the right upper lobe.  No pneumothorax. The cardiac silhouette is normal in overall size. The mediastinum is normal in contour.  IMPRESSION: Left lung base opacity is likely combination pleural fluid with either atelectasis, infiltrate or a combination.  Left upper lobe irregular lung opacity is most suggestive of scarring although infiltrate is also possible.  No convincing acute findings in the right lung. There is evidence of COPD.   Electronically Signed   By: Lajean Manes M.D.   On: 04/29/2013 12:14   US Thoracentesis Asp Pleural Space W/img Guide  04/27/2013   CLINICAL DATA:  Metastatic non-small cell lung carcinoma, dyspnea, recurrent left pleural effusion. Request is made for therapeutic left thoracentesis.  EXAM: ULTRASOUND GUIDED THERAPEUTIC LEFT THORACENTESIS  COMPARISON:  PREVIOUS THORACENTESIS ON 04/21/2013.  FINDINGS: A total of approximately 950 cc's of dark bloody fluid was removed.  IMPRESSION: Successful ultrasound guided therapeutic left thoracentesis yielding 950 cc's of pleural fluid.  Read by: Rowe Robert ,P.A.-C.  PROCEDURE: An ultrasound guided thoracentesis was thoroughly discussed with the patient and questions answered. The benefits, risks, alternatives and complications were also discussed. The patient understands and wishes to proceed with the procedure. Written consent was obtained.  Ultrasound was performed to localize and mark an adequate pocket of fluid in the left chest. The area was then prepped and draped in the normal sterile fashion.  1% Lidocaine was used for local anesthesia. Under ultrasound guidance a 19 gauge Yueh catheter was introduced. Thoracentesis was performed. The catheter was removed and a dressing applied.  Complications:  none   Electronically Signed   By: Markus Daft M.D.   On: 04/27/2013 14:15   US Thoracentesis Asp Pleural Space W/img Guide  04/21/2013   CLINICAL DATA:  Lung cancer, left-sided pleural effusion. Request therapeutic thoracentesis  EXAM: ULTRASOUND GUIDED left THORACENTESIS  COMPARISON:  CT scan of the chest from 04/18/2013.  FINDINGS: A total of approximately 1.2 L of bloody pleural fluid was removed. A fluid sample was notsent for laboratory analysis.  IMPRESSION: Successful ultrasound guided left thoracentesis yielding 1.2 L of pleural fluid.  Read by: Ascencion Dike PA-C  PROCEDURE: An ultrasound guided thoracentesis was thoroughly discussed with the patient and questions answered. The benefits, risks, alternatives and complications were also discussed. The patient understands and wishes to proceed with the procedure. Written consent was obtained.  Ultrasound was performed to localize and mark an adequate pocket of fluid in the left chest. The area was then prepped and draped in the normal sterile fashion. 1% Lidocaine was used for local anesthesia. Under ultrasound guidance a 19 gauge Yueh catheter was introduced. Thoracentesis was performed. The catheter was removed and a dressing applied.  Complications:  None immediate.   Electronically Signed   By: Arne Cleveland M.D.   On: 04/21/2013 13:57   ASSESSMENT AND PLAN: This is a very pleasant 73 years old white male with history of metastatic non-small cell lung cancer, adenocarcinoma status post systemic chemotherapy with carboplatin and Alimta followed by 2 months treatment with single agent Tarceva and one dose of gemcitabine discontinued secondary to her recent hospitalization with severe dyspnea.  The patient is too weak to resume any systemic  therapy at this point. I recommended for him  to continue on observation for the next 2 weeks to see if he would have any improvement in his performance status. I may consider the patient for treatment with immunotherapy with Nivolumab once clinical trial is open which is expected in few weeks, depending on his eligibility. He was advised to call immediately if he has any concerning symptoms in the interval. The patient voices understanding of current disease status and treatment options and is in agreement with the current care plan.  All questions were answered. The patient knows to call the clinic with any problems, questions or concerns. We can certainly see the patient much sooner if necessary.  Eilleen Kempf., Luis Payne 05/17/2013

## 2013-05-17 NOTE — Progress Notes (Signed)
05/17/2013 Patient in to clinic for routine scheduled visit today. Upon arrival, the patient completed the Patient Reported Outcomes (PROs) independently, including the EQ-5D-3L followed by the Lung Cancer Symptom Scale (LCSS), prior to the MD appointment. Following completion, patient identifiers were added to the EQ-5D-3L questionnaire. Explained to patient that research blood collection is not due at this visit. Patient's wife states that treatment is not expected to be administered today, due to continued recovery from recent hospitalization for pneumonia. Noted that I would continue to follow his progress to determine the next time for collection of blood samples and PRO, based on his anticipated course. Thanked patient for his participation. Cindy S. Brigitte Pulse BSN, RN, [place] 05/17/2013 10:08 AM

## 2013-05-17 NOTE — Telephone Encounter (Signed)
gv pt appt schedule for january. per 1/6 pof all existing lb/tx appts cx'd.

## 2013-05-20 ENCOUNTER — Ambulatory Visit (INDEPENDENT_AMBULATORY_CARE_PROVIDER_SITE_OTHER): Payer: Medicare Other | Admitting: Nurse Practitioner

## 2013-05-20 ENCOUNTER — Encounter: Payer: Self-pay | Admitting: Nurse Practitioner

## 2013-05-20 VITALS — BP 110/70 | HR 82 | Ht 66.0 in | Wt 127.8 lb

## 2013-05-20 DIAGNOSIS — I5022 Chronic systolic (congestive) heart failure: Secondary | ICD-10-CM

## 2013-05-20 DIAGNOSIS — I509 Heart failure, unspecified: Secondary | ICD-10-CM

## 2013-05-20 NOTE — Progress Notes (Signed)
[name] Date of Birth: Jul 16, 1940 Medical Record #[id number]  History of Present Illness: Mr. Nghiem is seen back today for a 3 month check. Seen for Dr. Acie Fredrickson. He has metastatic lung cancer with brain involvement. Has had radiation and chemo. 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 Echo at that time showed an EF down to 30 to 35%. Has been managed conservatively due to his lung cancer.   Last seen by me in September - remained pretty weak and short of breath.   Has been hospitalized over Christmas with large left pleural effusion and near-complete consolidation of the left lung associated with the left-sided effusion and multiple enhancing left-sided pleural nodules. He underwent ultrasound-guided left thoracentesis with drainage of 950 cc of dark bloody fluid. The patient was also treated for bilateral pneumonia and COPD exacerbation. His chemo is currently on hold given his functional status but they may consider the patient for treatment with immunotherapy with Nivolumab once clinical trial is open which is expected in few weeks, depending on his eligibility according to Dr. Earlie Server.  He is on Lovenox now for PE noted in the right lung on recent CT scan. An echo done during that admission showed his EF had improved. ACE was stopped but he was able to stay on his Coreg.   Comes back today. Here with his wife. Has his oxygen on. Says he is getting stronger. Walking more. Hopeful to get enrolled in the clinical trial per Dr. Earlie Server. BP is ok at home. Breathing is stable. No cough. No chest pain. Remains on Lovenox.    Current Outpatient Prescriptions  Medication Sig Dispense Refill  . Ascorbic Acid (VITAMIN C) 1000 MG tablet Take 1,000 mg by mouth daily.      . carvedilol (COREG) 3.125 MG tablet Take 1 tablet (3.125 mg total) by mouth 2 (two) times daily.  180 tablet  3  . cetirizine (ZYRTEC) 10 MG tablet Take 10 mg by mouth daily.      . cholecalciferol (VITAMIN D) 1000 UNITS  tablet Take 1,000 Units by mouth every morning.      . digoxin (LANOXIN) 0.125 MG tablet Take 1 tablet (0.125 mg total) by mouth daily.  90 tablet  3  . enoxaparin (LOVENOX) 60 MG/0.6ML injection Inject 0.6 mLs (60 mg total) into the skin daily.  90 Syringe  1  . feeding supplement (ENSURE COMPLETE) LIQD Take 237 mLs by mouth 2 (two) times daily between meals.  30 Bottle  5  . folic acid (FOLVITE) 1 MG tablet TAKE 1 TABLET (1 MG TOTAL) BY MOUTH DAILY.  90 tablet  1  . levalbuterol (XOPENEX) 0.63 MG/3ML nebulizer solution Take 3 mLs (0.63 mg total) by nebulization every 4 (four) hours as needed for wheezing.  3 mL  120  . Multiple Vitamin (MULTIVITAMIN WITH MINERALS) TABS Take 1 tablet by mouth every morning.      Marland Kitchen oxyCODONE (OXY IR/ROXICODONE) 5 MG immediate release tablet Take 1 tablet (5 mg total) by mouth every 4 (four) hours as needed for severe pain.  30 tablet  0  . OXYGEN-HELIUM IN Inhale 3 L into the lungs daily.      . predniSONE (DELTASONE) 10 MG tablet Take 1 tablet (10 mg total) by mouth daily with breakfast.  30 tablet  1  . prochlorperazine (COMPAZINE) 10 MG tablet Take 1 tablet (10 mg total) by mouth every 6 (six) hours as needed for nausea or vomiting.  30 tablet  0  .  guaiFENesin (MUCINEX) 600 MG 12 hr tablet Take 600 mg by mouth daily.       No current facility-administered medications for this visit.    No Known Allergies  Past Medical History  Diagnosis Date  . Substance abuse     TOBACCO  . Lung mass   . 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 Brain metastases 08/18/12    mri-  . HOH (hard of hearing)     wears hearing aids  . COPD (chronic obstructive pulmonary disease)   . Systolic heart failure     EF of 30 to 35% per echo in June 2014  . Lung cancer   . Legionella pneumonia   . CHF (congestive heart failure)     Past Surgical History  Procedure Laterality Date  . Wrist surgery    . Lung biopsy Left 08/17/12    LUL lung mass-adenocarcinoma  .  Ganglion cyst excision    . Nose surgery      History  Smoking status  . Former Smoker -- 1.00 packs/day for 60 years  . Types: Cigarettes  . Quit date: 09/27/2012  Smokeless tobacco  . Never Used    History  Alcohol Use No    Family History  Problem Relation Age of Onset  . COPD Father   . Cancer Sister   . Diabetes Brother     Review of Systems: The review of systems is per the HPI.  All other systems were reviewed and are negative.  Physical Exam: BP 110/70  Pulse 82  Ht 5\' 6"  (1.676 m)  Wt 127 lb 12.8 oz (57.97 kg)  BMI 20.64 kg/m2  SpO2 95% Patient is very pleasant and in no acute distress. Actually looks stronger today. Remains quite thin. Skin is warm and dry. Color is normal.  HEENT is unremarkable. Normocephalic/atraumatic. PERRL. Sclera are nonicteric. Neck is supple. No masses. No JVD. Lungs are coarse. Cardiac exam shows a regular rate and rhythm. Abdomen is soft. Extremities are without edema. Gait and ROM are intact. No gross neurologic deficits noted.  Wt Readings from Last 3 Encounters:  05/20/13 127 lb 12.8 oz (57.97 kg)  05/17/13 124 lb 9.6 oz (56.518 kg)  05/07/13 134 lb 11.2 oz (61.1 kg)     LABORATORY DATA:  Lab Results  Component Value Date   WBC 6.0 05/17/2013   HGB 12.8* 05/17/2013   HCT 38.1* 05/17/2013   PLT 222 05/17/2013   GLUCOSE 112 05/17/2013   ALT 31 05/17/2013   AST 24 05/17/2013   NA 140 05/17/2013   K 4.3 05/17/2013   CL 100 05/04/2013   CREATININE 1.0 05/17/2013   BUN 19.4 05/17/2013   CO2 34* 05/17/2013   INR 0.96 08/17/2012   Echo Study Conclusions from December 2014  - Left ventricle: The cavity size was normal. There was mild concentric hypertrophy. Systolic function was normal. The estimated ejection fraction was in the range of 60% to 65%. Wall motion was normal; there were no regional wall motion abnormalities. Doppler parameters are consistent with abnormal left ventricular relaxation (grade 1 diastolic dysfunction). - Aortic  valve: Mild regurgitation. Valve area: 3.52cm^2 (Vmax).   Assessment / Plan: 1. Systolic HF - EF was down to 30 to 35% - now back to normal per recent echo - I have left him on his current regimen. See him back in 4 months.   2. Metastatic lung cancer with brain mets - therapy currently on hold but hoping to  be enrolled in a clinical trial - followed by Dr. Earlie Server.  3. Chronic dyspnea - suspect this is multifactorial from his cancer, CHF, emphysema, PE etc. On oxygen.   4. PE - on Lovenox  Will see him back in 4 months. No change in medicines.   Patient is agreeable to this plan and will call if any problems develop in the interim.   Burtis Junes, RN, [place]  [address] [place]  [place], [place] [address]

## 2013-05-20 NOTE — Patient Instructions (Addendum)
Stay on your current medicines  I will see you in 4 months  Try to be as active as possible  Call the [place] office at [phone] if you have any questions, problems or concerns.

## 2013-05-24 ENCOUNTER — Other Ambulatory Visit: Payer: Medicare Other

## 2013-05-31 ENCOUNTER — Encounter: Payer: Self-pay | Admitting: Oncology

## 2013-05-31 ENCOUNTER — Telehealth: Payer: Self-pay | Admitting: Internal Medicine

## 2013-05-31 ENCOUNTER — Other Ambulatory Visit (HOSPITAL_BASED_OUTPATIENT_CLINIC_OR_DEPARTMENT_OTHER): Payer: Medicare Other

## 2013-05-31 ENCOUNTER — Encounter: Payer: Self-pay | Admitting: Internal Medicine

## 2013-05-31 ENCOUNTER — Ambulatory Visit (HOSPITAL_BASED_OUTPATIENT_CLINIC_OR_DEPARTMENT_OTHER): Payer: Medicare Other | Admitting: Internal Medicine

## 2013-05-31 VITALS — BP 151/64 | HR 107 | Temp 97.6°F | Resp 17 | Ht 66.0 in | Wt 127.4 lb

## 2013-05-31 DIAGNOSIS — C3410 Malignant neoplasm of upper lobe, unspecified bronchus or lung: Secondary | ICD-10-CM

## 2013-05-31 DIAGNOSIS — C7949 Secondary malignant neoplasm of other parts of nervous system: Secondary | ICD-10-CM

## 2013-05-31 DIAGNOSIS — C3492 Malignant neoplasm of unspecified part of left bronchus or lung: Secondary | ICD-10-CM

## 2013-05-31 DIAGNOSIS — C7931 Secondary malignant neoplasm of brain: Secondary | ICD-10-CM

## 2013-05-31 DIAGNOSIS — R52 Pain, unspecified: Secondary | ICD-10-CM

## 2013-05-31 LAB — CBC WITH DIFFERENTIAL/PLATELET
BASO%: 0.2 % (ref 0.0–2.0)
BASOS ABS: 0 10*3/uL (ref 0.0–0.1)
EOS ABS: 0 10*3/uL (ref 0.0–0.5)
EOS%: 0.2 % (ref 0.0–7.0)
HCT: 36.1 % — ABNORMAL LOW (ref 38.4–49.9)
HEMOGLOBIN: 12 g/dL — AB (ref 13.0–17.1)
LYMPH#: 0.4 10*3/uL — AB (ref 0.9–3.3)
LYMPH%: 4.9 % — ABNORMAL LOW (ref 14.0–49.0)
MCH: 33.1 pg (ref 27.2–33.4)
MCHC: 33.3 g/dL (ref 32.0–36.0)
MCV: 99.3 fL — ABNORMAL HIGH (ref 79.3–98.0)
MONO#: 0.4 10*3/uL (ref 0.1–0.9)
MONO%: 5.3 % (ref 0.0–14.0)
NEUT#: 6.9 10*3/uL — ABNORMAL HIGH (ref 1.5–6.5)
NEUT%: 89.4 % — ABNORMAL HIGH (ref 39.0–75.0)
Platelets: 225 10*3/uL (ref 140–400)
RBC: 3.63 10*6/uL — AB (ref 4.20–5.82)
RDW: 16.1 % — AB (ref 11.0–14.6)
WBC: 7.7 10*3/uL (ref 4.0–10.3)

## 2013-05-31 LAB — COMPREHENSIVE METABOLIC PANEL (CC13)
ALBUMIN: 2.9 g/dL — AB (ref 3.5–5.0)
ALT: 23 U/L (ref 0–55)
ANION GAP: 10 meq/L (ref 3–11)
AST: 24 U/L (ref 5–34)
Alkaline Phosphatase: 102 U/L (ref 40–150)
BILIRUBIN TOTAL: 0.34 mg/dL (ref 0.20–1.20)
BUN: 23.9 mg/dL (ref 7.0–26.0)
CHLORIDE: 98 meq/L (ref 98–109)
CO2: 35 meq/L — AB (ref 22–29)
Calcium: 10.1 mg/dL (ref 8.4–10.4)
Creatinine: 1 mg/dL (ref 0.7–1.3)
Glucose: 165 mg/dl — ABNORMAL HIGH (ref 70–140)
POTASSIUM: 4.5 meq/L (ref 3.5–5.1)
SODIUM: 143 meq/L (ref 136–145)
TOTAL PROTEIN: 7.1 g/dL (ref 6.4–8.3)

## 2013-05-31 LAB — RESEARCH LABS

## 2013-05-31 MED ORDER — OXYCODONE-ACETAMINOPHEN 10-325 MG PO TABS: 1.0000 | ORAL_TABLET | Freq: Four times a day (QID) | ORAL | Status: DC | PRN

## 2013-05-31 NOTE — Telephone Encounter (Signed)
gv adn pritned appt sched and avs for pt for Feb

## 2013-05-31 NOTE — Progress Notes (Signed)
05/31/13 - BMS [id number] - Questionnaires - Patient into cancer center this afternoon for labs and to see MD.  The patient completed the Patient Reported Outcomes (PRO's) independently, including the EQ-5D-3L first then the LCSS booklet before he had any study procedures performed.  After he completed the PRO's, I checked them for completeness.  I thanked the patient for his continued support of this clinical trial.

## 2013-05-31 NOTE — Patient Instructions (Signed)
Followup visit in 2 weeks. 

## 2013-05-31 NOTE — Progress Notes (Signed)
[facility] [phone]   [phone]  OFFICE VISIT PROGRESS NOTE  Glo Herring., MD [address] [place] [address]  DIAGNOSIS: Metastatic non-small cell lung cancer, adenocarcinoma with negative EGFR mutation and negative ALK gene translocation diagnosed in April 2014   PRIOR THERAPY:  1. Status post stereotactic radiotherapy to 2 brain lesions under the care of Dr. Tammi Klippel.  2. Status post palliative radiotherapy to the left lung mass.  3. Systemic chemotherapy with carboplatin for AUC of 5 and Alimta 500 mg/M2 every 3 weeks, status post 6 cycles. 4. Tarceva 150 mg by mouth daily. Started 02/25/2013. Status post approximately two month of therapy discontinued today secondary to disease progression. 5. Systemic chemotherapy with gemcitabine 1000 mg/M2 on days 1 and 8 every 3 weeks. First dose on 04/26/2013  CURRENT THERAPY: None   CHEMOTHERAPY INTENT: Palliative  CURRENT # OF CHEMOTHERAPY CYCLES: 0  CURRENT ANTIEMETICS: Compazine CURRENT SMOKING STATUS: Former smoker, quit 09/28/2012  ORAL CHEMOTHERAPY AND CONSENT: Tarceva and the patient signed consent today.  CURRENT BISPHOSPHONATES USE: none  PAIN MANAGEMENT: Percocet  NARCOTICS INDUCED CONSTIPATION: none  LIVING WILL AND CODE STATUS: No code Blue.  INTERVAL HISTORY: Luis Payne 73 y.o. male returns to the clinic today for followup visit accompanied by his brother. The patient is feeling a little bit better today but continues to have the baseline shortness of breath and he is currently on home oxygen. He also has mild fatigue. He denied having any significant nausea or vomiting. He has no fever or chills. The patient denied having any significant weight loss or night sweats. The patient received only 1 dose of treatment with gemcitabine but this was discontinued after his hospitalization. He came today for reevaluation and consideration of enrollment in the  immunotherapy clinical trial with Nivolumab.  MEDICAL HISTORY: Past Medical History  Diagnosis Date  . Substance abuse     TOBACCO  . Lung mass   . 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 Brain metastases 08/18/12    mri-  . HOH (hard of hearing)     wears hearing aids  . COPD (chronic obstructive pulmonary disease)   . Systolic heart failure     EF of 30 to 35% per echo in June 2014  . Lung cancer   . Legionella pneumonia   . CHF (congestive heart failure)     ALLERGIES:  has No Known Allergies.  MEDICATIONS:  Current Outpatient Prescriptions  Medication Sig Dispense Refill  . Ascorbic Acid (VITAMIN C) 1000 MG tablet Take 1,000 mg by mouth daily.      . carvedilol (COREG) 3.125 MG tablet Take 1 tablet (3.125 mg total) by mouth 2 (two) times daily.  180 tablet  3  . cetirizine (ZYRTEC) 10 MG tablet Take 10 mg by mouth daily.      . cholecalciferol (VITAMIN D) 1000 UNITS tablet Take 1,000 Units by mouth every morning.      . digoxin (LANOXIN) 0.125 MG tablet Take 1 tablet (0.125 mg total) by mouth daily.  90 tablet  3  . enoxaparin (LOVENOX) 60 MG/0.6ML injection Inject 0.6 mLs (60 mg total) into the skin daily.  90 Syringe  1  . feeding supplement (ENSURE COMPLETE) LIQD Take 237 mLs by mouth 2 (two) times daily between meals.  30 Bottle  5  . folic acid (FOLVITE) 1 MG tablet TAKE 1 TABLET (1 MG TOTAL) BY MOUTH DAILY.  90 tablet  1  . guaiFENesin (MUCINEX) 600 MG 12 hr tablet Take 600 mg by mouth daily.      Marland Kitchen levalbuterol (XOPENEX) 0.63 MG/3ML nebulizer solution Take 3 mLs (0.63 mg total) by nebulization every 4 (four) hours as needed for wheezing.  3 mL  120  . Multiple Vitamin (MULTIVITAMIN WITH MINERALS) TABS Take 1 tablet by mouth every morning.      Marland Kitchen oxyCODONE (OXY IR/ROXICODONE) 5 MG immediate release tablet Take 1 tablet (5 mg total) by mouth every 4 (four) hours as needed for severe pain.  30 tablet  0  . OXYGEN-HELIUM IN Inhale 3 L into the lungs daily.      .  predniSONE (DELTASONE) 10 MG tablet Take 1 tablet (10 mg total) by mouth daily with breakfast.  30 tablet  1  . prochlorperazine (COMPAZINE) 10 MG tablet Take 1 tablet (10 mg total) by mouth every 6 (six) hours as needed for nausea or vomiting.  30 tablet  0   No current facility-administered medications for this visit.    SURGICAL HISTORY:  Past Surgical History  Procedure Laterality Date  . Wrist surgery    . Lung biopsy Left 08/17/12    LUL lung mass-adenocarcinoma  . Ganglion cyst excision    . Nose surgery      REVIEW OF SYSTEMS:  Constitutional: positive for fatigue Eyes: positive for irritation Ears, nose, mouth, throat, and face: negative Respiratory: positive for dyspnea on exertion and On supplemental oxygen, 2 L nasal cannula Cardiovascular: negative Gastrointestinal: negative Genitourinary:negative Integument/breast: positive for rash Hematologic/lymphatic: negative Musculoskeletal:negative Neurological: negative Behavioral/Psych: negative Endocrine: negative Allergic/Immunologic: negative   PHYSICAL EXAMINATION: General appearance: alert, cooperative, fatigued and no distress Head: Normocephalic, without obvious abnormality, atraumatic Neck: no adenopathy, no JVD, supple, symmetrical, trachea midline and thyroid not enlarged, symmetric, no tenderness/mass/nodules Lymph nodes: Cervical, supraclavicular, and axillary nodes normal. Resp: wheezes bilaterally Back: symmetric, no curvature. ROM normal. No CVA tenderness. Cardio: regular rate and rhythm, S1, S2 normal, no murmur, click, rub or gallop GI: soft, non-tender; bowel sounds normal; no masses,  no organomegaly Extremities: extremities normal, atraumatic, no cyanosis or edema Neurologic: Alert and oriented X 3, normal strength and tone. Normal symmetric reflexes. Normal coordination and gait Skin: Mild to moderate acneform eruptions over the nose, chin and cheeks, no evidence of superinfection.   ECOG  PERFORMANCE STATUS: 2 - Symptomatic, <50% confined to bed  Blood pressure 151/64, pulse 107, temperature 97.6 F (36.4 C), temperature source Oral, resp. rate 17, height 5' 6"  (1.676 m), weight 127 lb 6.4 oz (57.788 kg), SpO2 92.00%.  LABORATORY DATA: Lab Results  Component Value Date   WBC 7.7 05/31/2013   HGB 12.0* 05/31/2013   HCT 36.1* 05/31/2013   MCV 99.3* 05/31/2013   PLT 225 05/31/2013      Chemistry      Component Value Date/Time   NA 143 05/31/2013 1442   NA 134* 05/04/2013 0518   K 4.5 05/31/2013 1442   K 4.1 05/04/2013 0518   CL 100 05/04/2013 0518   CL 94* 11/01/2012 1105   CO2 35* 05/31/2013 1442   CO2 26 05/04/2013 0518   BUN 23.9 05/31/2013 1442   BUN 28* 05/04/2013 0518   CREATININE 1.0 05/31/2013 1442   CREATININE 1.03 05/06/2013 0515      Component Value Date/Time   CALCIUM 10.1 05/31/2013 1442   CALCIUM 9.0 05/04/2013 0518   ALKPHOS 102 05/31/2013 1442   ALKPHOS 128* 10/18/2012 0500   AST  24 05/31/2013 1442   AST 40* 10/18/2012 0500   ALT 23 05/31/2013 1442   ALT 34 10/18/2012 0500   BILITOT 0.34 05/31/2013 1442   BILITOT 0.9 10/18/2012 0500       RADIOGRAPHIC STUDIES:  ASSESSMENT AND PLAN: This is a very pleasant 73 years old white male with history of metastatic non-small cell lung cancer, adenocarcinoma status post systemic chemotherapy with carboplatin and Alimta followed by 2 months treatment with single agent Tarceva and one dose of gemcitabine discontinued secondary to her recent hospitalization with severe dyspnea.  The patient is too weak to resume any systemic chemotherapy at this point. I sent the patient would be a good candidate for treatment with Nivolumab on the [id number] clinical trial. Unfortunately the trial is currently close but expected to reopen for enrollment in either symptoms in the next 2 weeks. I will arrange for the patient to come back for followup visit in 2 weeks for reevaluation and then enrollment in the clinical trial if he is  eligible. If he is not a candidate for the treatment with Nivolumab, I will discuss with the patient other treatment options. For pain management, he was given new prescription for Percocet 10/325 mg by mouth every 6 hours as needed for pain. He was advised to call immediately if he has any concerning symptoms in the interval. The patient voices understanding of current disease status and treatment options and is in agreement with the current care plan.  All questions were answered. The patient knows to call the clinic with any problems, questions or concerns. We can certainly see the patient much sooner if necessary.  Eilleen Kempf., MD 05/31/2013

## 2013-06-09 ENCOUNTER — Other Ambulatory Visit: Payer: Self-pay | Admitting: *Deleted

## 2013-06-09 DIAGNOSIS — C3410 Malignant neoplasm of upper lobe, unspecified bronchus or lung: Secondary | ICD-10-CM

## 2013-06-13 ENCOUNTER — Other Ambulatory Visit: Payer: Self-pay

## 2013-06-13 ENCOUNTER — Encounter: Payer: Medicare Other | Admitting: *Deleted

## 2013-06-13 ENCOUNTER — Telehealth: Payer: Self-pay | Admitting: Internal Medicine

## 2013-06-13 ENCOUNTER — Ambulatory Visit (HOSPITAL_BASED_OUTPATIENT_CLINIC_OR_DEPARTMENT_OTHER): Payer: Medicare Other | Admitting: Internal Medicine

## 2013-06-13 ENCOUNTER — Other Ambulatory Visit (HOSPITAL_BASED_OUTPATIENT_CLINIC_OR_DEPARTMENT_OTHER): Payer: Medicare Other

## 2013-06-13 ENCOUNTER — Encounter: Payer: Self-pay | Admitting: *Deleted

## 2013-06-13 ENCOUNTER — Encounter: Payer: Self-pay | Admitting: Internal Medicine

## 2013-06-13 VITALS — BP 129/60 | HR 105 | Temp 97.6°F | Resp 17 | Ht 66.0 in | Wt 125.6 lb

## 2013-06-13 DIAGNOSIS — R0609 Other forms of dyspnea: Secondary | ICD-10-CM

## 2013-06-13 DIAGNOSIS — C7949 Secondary malignant neoplasm of other parts of nervous system: Secondary | ICD-10-CM

## 2013-06-13 DIAGNOSIS — C7931 Secondary malignant neoplasm of brain: Secondary | ICD-10-CM

## 2013-06-13 DIAGNOSIS — C3410 Malignant neoplasm of upper lobe, unspecified bronchus or lung: Secondary | ICD-10-CM

## 2013-06-13 DIAGNOSIS — R0989 Other specified symptoms and signs involving the circulatory and respiratory systems: Secondary | ICD-10-CM

## 2013-06-13 LAB — COMPREHENSIVE METABOLIC PANEL (CC13)
ALT: 22 U/L (ref 0–55)
AST: 25 U/L (ref 5–34)
Albumin: 2.9 g/dL — ABNORMAL LOW (ref 3.5–5.0)
Alkaline Phosphatase: 104 U/L (ref 40–150)
Anion Gap: 11 mEq/L (ref 3–11)
BILIRUBIN TOTAL: 0.57 mg/dL (ref 0.20–1.20)
BUN: 25.9 mg/dL (ref 7.0–26.0)
CO2: 31 mEq/L — ABNORMAL HIGH (ref 22–29)
CREATININE: 1 mg/dL (ref 0.7–1.3)
Calcium: 10.3 mg/dL (ref 8.4–10.4)
Chloride: 99 mEq/L (ref 98–109)
Glucose: 131 mg/dl (ref 70–140)
Potassium: 4 mEq/L (ref 3.5–5.1)
Sodium: 142 mEq/L (ref 136–145)
Total Protein: 7 g/dL (ref 6.4–8.3)

## 2013-06-13 LAB — CBC WITH DIFFERENTIAL/PLATELET
BASO%: 0.6 % (ref 0.0–2.0)
Basophils Absolute: 0 10*3/uL (ref 0.0–0.1)
EOS%: 0.7 % (ref 0.0–7.0)
Eosinophils Absolute: 0.1 10*3/uL (ref 0.0–0.5)
HEMATOCRIT: 35.3 % — AB (ref 38.4–49.9)
HGB: 12 g/dL — ABNORMAL LOW (ref 13.0–17.1)
LYMPH#: 0.5 10*3/uL — AB (ref 0.9–3.3)
LYMPH%: 6.9 % — ABNORMAL LOW (ref 14.0–49.0)
MCH: 33.4 pg (ref 27.2–33.4)
MCHC: 34 g/dL (ref 32.0–36.0)
MCV: 98.5 fL — ABNORMAL HIGH (ref 79.3–98.0)
MONO#: 0.7 10*3/uL (ref 0.1–0.9)
MONO%: 9.9 % (ref 0.0–14.0)
NEUT#: 6.1 10*3/uL (ref 1.5–6.5)
NEUT%: 81.9 % — AB (ref 39.0–75.0)
Platelets: 234 10*3/uL (ref 140–400)
RBC: 3.59 10*6/uL — AB (ref 4.20–5.82)
RDW: 16.5 % — ABNORMAL HIGH (ref 11.0–14.6)
WBC: 7.4 10*3/uL (ref 4.0–10.3)

## 2013-06-13 LAB — HEPATITIS B SURFACE ANTIGEN: HEP B S AG: NEGATIVE

## 2013-06-13 LAB — TSH CHCC: TSH: 2.087 m(IU)/L (ref 0.320–4.118)

## 2013-06-13 LAB — T4, FREE: FREE T4: 1.31 ng/dL (ref 0.80–1.80)

## 2013-06-13 LAB — PHOSPHORUS: Phosphorus: 3.4 mg/dL (ref 2.3–4.6)

## 2013-06-13 LAB — MAGNESIUM (CC13): Magnesium: 2 mg/dl (ref 1.5–2.5)

## 2013-06-13 LAB — HEPATITIS C ANTIBODY: HCV Ab: NEGATIVE

## 2013-06-13 LAB — LACTATE DEHYDROGENASE (CC13): LDH: 145 U/L (ref 125–245)

## 2013-06-13 LAB — T3, FREE: T3, Free: 3.6 pg/mL (ref 2.3–4.2)

## 2013-06-13 NOTE — Progress Notes (Signed)
[facility] [phone]   [phone]  OFFICE VISIT PROGRESS NOTE  Glo Herring., MD [address] [place] [address]  DIAGNOSIS: Metastatic non-small cell lung cancer, adenocarcinoma with negative EGFR mutation and negative ALK gene translocation diagnosed in April 2014   PRIOR THERAPY:  1. Status post stereotactic radiotherapy to 2 brain lesions under the care of Dr. Tammi Klippel.  2. Status post palliative radiotherapy to the left lung mass.  3. Systemic chemotherapy with carboplatin for AUC of 5 and Alimta 500 mg/M2 every 3 weeks, status post 6 cycles. 4. Tarceva 150 mg by mouth daily. Started 02/25/2013. Status post approximately two month of therapy discontinued today secondary to disease progression. 5. Systemic chemotherapy with gemcitabine 1000 mg/M2 on days 1 and 8 every 3 weeks. First dose on 04/26/2013  CURRENT THERAPY: None  CHEMOTHERAPY INTENT: Palliative  CURRENT # OF CHEMOTHERAPY CYCLES: 0  CURRENT ANTIEMETICS: Compazine CURRENT SMOKING STATUS: Former smoker, quit 09/28/2012  ORAL CHEMOTHERAPY AND CONSENT: Tarceva and the patient signed consent today.  CURRENT BISPHOSPHONATES USE: none  PAIN MANAGEMENT: Percocet  NARCOTICS INDUCED CONSTIPATION: none  LIVING WILL AND CODE STATUS: No code Blue.  INTERVAL HISTORY: Luis Payne 73 y.o. male returns to the clinic today for followup visit accompanied by his wife. The patient continues to have the baseline shortness of breath and he is currently on home oxygen. He also has mild fatigue. He denied having any significant nausea or vomiting. He has no fever or chills. The patient denied having any significant weight loss or night sweats. He currently undergoing evaluation and consideration of enrollment in the immunotherapy clinical trial with Nivolumab. He has been on prednisone 10 mg by mouth daily for COPD for the last few weeks.  MEDICAL HISTORY: Past Medical  History  Diagnosis Date  . Substance abuse     TOBACCO  . Lung mass   . 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 Brain metastases 08/18/12    mri-  . HOH (hard of hearing)     wears hearing aids  . COPD (chronic obstructive pulmonary disease)   . Systolic heart failure     EF of 30 to 35% per echo in June 2014  . Lung cancer   . Legionella pneumonia   . CHF (congestive heart failure)     ALLERGIES:  has No Known Allergies.  MEDICATIONS:  Current Outpatient Prescriptions  Medication Sig Dispense Refill  . Ascorbic Acid (VITAMIN C) 1000 MG tablet Take 1,000 mg by mouth daily.      . carvedilol (COREG) 3.125 MG tablet Take 1 tablet (3.125 mg total) by mouth 2 (two) times daily.  180 tablet  3  . cetirizine (ZYRTEC) 10 MG tablet Take 10 mg by mouth daily.      . cholecalciferol (VITAMIN D) 1000 UNITS tablet Take 1,000 Units by mouth every morning.      . digoxin (LANOXIN) 0.125 MG tablet Take 1 tablet (0.125 mg total) by mouth daily.  90 tablet  3  . enoxaparin (LOVENOX) 60 MG/0.6ML injection Inject 0.6 mLs (60 mg total) into the skin daily.  90 Syringe  1  . feeding supplement (ENSURE COMPLETE) LIQD Take 237 mLs by mouth 2 (two) times daily between meals.  30 Bottle  5  . folic acid (FOLVITE) 1 MG tablet TAKE 1 TABLET (1 MG TOTAL) BY MOUTH DAILY.  90 tablet  1  . guaiFENesin (MUCINEX) 600 MG  12 hr tablet Take 600 mg by mouth daily.      Marland Kitchen levalbuterol (XOPENEX) 0.63 MG/3ML nebulizer solution Take 3 mLs (0.63 mg total) by nebulization every 4 (four) hours as needed for wheezing.  3 mL  120  . Multiple Vitamin (MULTIVITAMIN WITH MINERALS) TABS Take 1 tablet by mouth every morning.      Marland Kitchen oxyCODONE-acetaminophen (PERCOCET) 10-325 MG per tablet Take 1 tablet by mouth every 6 (six) hours as needed for pain.  60 tablet  0  . OXYGEN-HELIUM IN Inhale 3 L into the lungs daily.      . predniSONE (DELTASONE) 10 MG tablet Take 1 tablet (10 mg total) by mouth daily with breakfast.  30 tablet   1  . prochlorperazine (COMPAZINE) 10 MG tablet Take 1 tablet (10 mg total) by mouth every 6 (six) hours as needed for nausea or vomiting.  30 tablet  0   No current facility-administered medications for this visit.    SURGICAL HISTORY:  Past Surgical History  Procedure Laterality Date  . Wrist surgery    . Lung biopsy Left 08/17/12    LUL lung mass-adenocarcinoma  . Ganglion cyst excision    . Nose surgery      REVIEW OF SYSTEMS:  Constitutional: positive for fatigue Eyes: positive for irritation Ears, nose, mouth, throat, and face: negative Respiratory: positive for dyspnea on exertion and On supplemental oxygen, 2 L nasal cannula Cardiovascular: negative Gastrointestinal: negative Genitourinary:negative Integument/breast: positive for rash Hematologic/lymphatic: negative Musculoskeletal:negative Neurological: negative Behavioral/Psych: negative Endocrine: negative Allergic/Immunologic: negative   PHYSICAL EXAMINATION: General appearance: alert, cooperative, fatigued and no distress Head: Normocephalic, without obvious abnormality, atraumatic Neck: no adenopathy, no JVD, supple, symmetrical, trachea midline and thyroid not enlarged, symmetric, no tenderness/mass/nodules Lymph nodes: Cervical, supraclavicular, and axillary nodes normal. Resp: wheezes bilaterally Back: symmetric, no curvature. ROM normal. No CVA tenderness. Cardio: regular rate and rhythm, S1, S2 normal, no murmur, click, rub or gallop GI: soft, non-tender; bowel sounds normal; no masses,  no organomegaly Extremities: extremities normal, atraumatic, no cyanosis or edema Neurologic: Alert and oriented X 3, normal strength and tone. Normal symmetric reflexes. Normal coordination and gait Skin: Mild to moderate acneform eruptions over the nose, chin and cheeks, no evidence of superinfection.   ECOG PERFORMANCE STATUS: 2 - Symptomatic, <50% confined to bed  Blood pressure 129/60, pulse 105, temperature 97.6 F  (36.4 C), temperature source Oral, resp. rate 17, height 5' 6"  (1.676 m), weight 125 lb 9.6 oz (56.972 kg), SpO2 90.00%.  LABORATORY DATA: Lab Results  Component Value Date   WBC 7.4 06/13/2013   HGB 12.0* 06/13/2013   HCT 35.3* 06/13/2013   MCV 98.5* 06/13/2013   PLT 234 06/13/2013      Chemistry      Component Value Date/Time   NA 142 06/13/2013 0905   NA 134* 05/04/2013 0518   K 4.0 06/13/2013 0905   K 4.1 05/04/2013 0518   CL 100 05/04/2013 0518   CL 94* 11/01/2012 1105   CO2 31* 06/13/2013 0905   CO2 26 05/04/2013 0518   BUN 25.9 06/13/2013 0905   BUN 28* 05/04/2013 0518   CREATININE 1.0 06/13/2013 0905   CREATININE 1.03 05/06/2013 0515      Component Value Date/Time   CALCIUM 10.3 06/13/2013 0905   CALCIUM 9.0 05/04/2013 0518   ALKPHOS 104 06/13/2013 0905   ALKPHOS 128* 10/18/2012 0500   AST 25 06/13/2013 0905   AST 40* 10/18/2012 0500   ALT 22 06/13/2013 0905  ALT 34 10/18/2012 0500   BILITOT 0.57 06/13/2013 0905   BILITOT 0.9 10/18/2012 0500       RADIOGRAPHIC STUDIES:  ASSESSMENT AND PLAN: This is a very pleasant 73 years old white male with history of metastatic non-small cell lung cancer, adenocarcinoma status post systemic chemotherapy with carboplatin and Alimta followed by 2 months treatment with single agent Tarceva and one dose of gemcitabine discontinued secondary to her recent hospitalization with severe dyspnea.  The patient is currently undergoing evaluation for involvement in the [id number] clinical trial with Nivolumab. Will have to repeat staging scans including CT scan of the chest, abdomen and pelvis before starting the trial. I will September his prednisone dose to 5 mg by mouth daily. If he is eligible for the clinical trial, he'll start the first dose of this treatment in less than 2 weeks. He was advised to call immediately if he has any concerning symptoms in the interval. The patient voices understanding of current disease status and treatment options and is in agreement  with the current care plan.  All questions were answered. The patient knows to call the clinic with any problems, questions or concerns. We can certainly see the patient much sooner if necessary.  Eilleen Kempf., MD 06/13/2013

## 2013-06-13 NOTE — Progress Notes (Signed)
See Consent Form encounter for documentation related to today's consent visit.

## 2013-06-13 NOTE — Telephone Encounter (Signed)
gv adn printed appt sched and avs for pt for Feb....sed added tx.

## 2013-06-13 NOTE — Patient Instructions (Signed)
Followup visit in 2 weeks for evaluation before starting the first dose of immunotherapy.

## 2013-06-14 ENCOUNTER — Telehealth: Payer: Self-pay | Admitting: *Deleted

## 2013-06-14 NOTE — Telephone Encounter (Signed)
Spoke with pt's wife, she verbalized understanding regarding continuing lovenox until 3 month supply is out and Dr [facility] and pt will reassess at that time.  SLJ

## 2013-06-14 NOTE — Telephone Encounter (Signed)
Message copied by Britt Bottom on Tue Jun 14, 2013  9:06 AM ------      Message from: Curt Bears      Created: Mon Jun 13, 2013  5:09 PM      Regarding: RE: lovenox       He can continue on Lovenox for now until you complete this 3 month supply that he has, then we may consider continuation of Lovenox or switch to Xarelto.      ----- Message -----         From: Anders Grant, RN         Sent: 06/13/2013   3:23 PM           To: Curt Bears, MD      Subject: Melton Alar: lovenox                                              Thoughts?                  ----- Message -----         From: Benson Norway, RN         Sent: 06/13/2013  11:06 AM           To: Anders Grant, RN      Subject: lovenox                                                  Mrs. Tukes inquired about Mr. Siegman's lovenox, and if he will be switched to an oral medication. He was discharged from the hospital with a 3 month supply of lovenox, and he has two months remaining.      Cindy S. Brigitte Pulse BSN, RN, CCRP      06/13/2013 11:07 AM                   ------

## 2013-06-15 ENCOUNTER — Encounter (HOSPITAL_COMMUNITY): Payer: Self-pay

## 2013-06-15 ENCOUNTER — Ambulatory Visit (HOSPITAL_COMMUNITY)
Admission: RE | Admit: 2013-06-15 | Discharge: 2013-06-15 | Disposition: A | Discharge status: Home / Self Care | Payer: Medicare Other | Source: Ambulatory Visit | Attending: Internal Medicine | Admitting: Internal Medicine

## 2013-06-15 DIAGNOSIS — R599 Enlarged lymph nodes, unspecified: Secondary | ICD-10-CM

## 2013-06-15 DIAGNOSIS — C3490 Malignant neoplasm of unspecified part of unspecified bronchus or lung: Secondary | ICD-10-CM

## 2013-06-15 DIAGNOSIS — J984 Other disorders of lung: Secondary | ICD-10-CM | POA: Insufficient documentation

## 2013-06-15 DIAGNOSIS — R911 Solitary pulmonary nodule: Secondary | ICD-10-CM | POA: Insufficient documentation

## 2013-06-15 DIAGNOSIS — N200 Calculus of kidney: Secondary | ICD-10-CM

## 2013-06-15 DIAGNOSIS — C782 Secondary malignant neoplasm of pleura: Secondary | ICD-10-CM

## 2013-06-15 DIAGNOSIS — K4020 Bilateral inguinal hernia, without obstruction or gangrene, not specified as recurrent: Secondary | ICD-10-CM | POA: Insufficient documentation

## 2013-06-15 DIAGNOSIS — J910 Malignant pleural effusion: Secondary | ICD-10-CM

## 2013-06-15 DIAGNOSIS — C3410 Malignant neoplasm of upper lobe, unspecified bronchus or lung: Secondary | ICD-10-CM

## 2013-06-15 MED ORDER — IOHEXOL 300 MG/ML  SOLN
100.0000 mL | Freq: Once | INTRAMUSCULAR | Status: AC | PRN
  Administered 2013-06-15: 100 mL via INTRAVENOUS

## 2013-06-16 ENCOUNTER — Ambulatory Visit
Admission: RE | Admit: 2013-06-16 | Discharge: 2013-06-16 | Disposition: A | Discharge status: Home / Self Care | Payer: Medicare Other | Source: Ambulatory Visit | Attending: Radiation Oncology | Admitting: Radiation Oncology

## 2013-06-16 DIAGNOSIS — C7949 Secondary malignant neoplasm of other parts of nervous system: Principal | ICD-10-CM

## 2013-06-16 DIAGNOSIS — C7931 Secondary malignant neoplasm of brain: Secondary | ICD-10-CM

## 2013-06-16 MED ORDER — GADOBENATE DIMEGLUMINE 529 MG/ML IV SOLN
11.0000 mL | Freq: Once | INTRAVENOUS | Status: AC | PRN
  Administered 2013-06-16: 11 mL via INTRAVENOUS

## 2013-06-17 ENCOUNTER — Encounter (HOSPITAL_COMMUNITY): Payer: Self-pay

## 2013-06-18 ENCOUNTER — Emergency Department (HOSPITAL_COMMUNITY): Payer: Medicare Other

## 2013-06-18 ENCOUNTER — Encounter (HOSPITAL_COMMUNITY): Payer: Self-pay | Admitting: Emergency Medicine

## 2013-06-18 ENCOUNTER — Inpatient Hospital Stay (HOSPITAL_COMMUNITY)
Admission: EM | Admit: 2013-06-18 | Discharge: 2013-06-20 | DRG: 190 | Disposition: A | Discharge status: Home / Self Care | Payer: Medicare Other | Attending: Internal Medicine | Admitting: Internal Medicine

## 2013-06-18 DIAGNOSIS — J9601 Acute respiratory failure with hypoxia: Secondary | ICD-10-CM

## 2013-06-18 DIAGNOSIS — N179 Acute kidney failure, unspecified: Secondary | ICD-10-CM

## 2013-06-18 DIAGNOSIS — A419 Sepsis, unspecified organism: Secondary | ICD-10-CM

## 2013-06-18 DIAGNOSIS — I2699 Other pulmonary embolism without acute cor pulmonale: Secondary | ICD-10-CM

## 2013-06-18 DIAGNOSIS — F1910 Other psychoactive substance abuse, uncomplicated: Secondary | ICD-10-CM

## 2013-06-18 DIAGNOSIS — I509 Heart failure, unspecified: Secondary | ICD-10-CM | POA: Diagnosis present

## 2013-06-18 DIAGNOSIS — J449 Chronic obstructive pulmonary disease, unspecified: Secondary | ICD-10-CM

## 2013-06-18 DIAGNOSIS — C3410 Malignant neoplasm of upper lobe, unspecified bronchus or lung: Secondary | ICD-10-CM | POA: Diagnosis present

## 2013-06-18 DIAGNOSIS — C3490 Malignant neoplasm of unspecified part of unspecified bronchus or lung: Secondary | ICD-10-CM

## 2013-06-18 DIAGNOSIS — J441 Chronic obstructive pulmonary disease with (acute) exacerbation: Principal | ICD-10-CM | POA: Diagnosis present

## 2013-06-18 DIAGNOSIS — Z833 Family history of diabetes mellitus: Secondary | ICD-10-CM

## 2013-06-18 DIAGNOSIS — D696 Thrombocytopenia, unspecified: Secondary | ICD-10-CM

## 2013-06-18 DIAGNOSIS — J9620 Acute and chronic respiratory failure, unspecified whether with hypoxia or hypercapnia: Secondary | ICD-10-CM | POA: Diagnosis present

## 2013-06-18 DIAGNOSIS — C7931 Secondary malignant neoplasm of brain: Secondary | ICD-10-CM | POA: Diagnosis present

## 2013-06-18 DIAGNOSIS — I2782 Chronic pulmonary embolism: Secondary | ICD-10-CM | POA: Diagnosis present

## 2013-06-18 DIAGNOSIS — D638 Anemia in other chronic diseases classified elsewhere: Secondary | ICD-10-CM

## 2013-06-18 DIAGNOSIS — Z7901 Long term (current) use of anticoagulants: Secondary | ICD-10-CM

## 2013-06-18 DIAGNOSIS — Z79899 Other long term (current) drug therapy: Secondary | ICD-10-CM

## 2013-06-18 DIAGNOSIS — Z87891 Personal history of nicotine dependence: Secondary | ICD-10-CM

## 2013-06-18 DIAGNOSIS — R918 Other nonspecific abnormal finding of lung field: Secondary | ICD-10-CM

## 2013-06-18 DIAGNOSIS — D899 Disorder involving the immune mechanism, unspecified: Secondary | ICD-10-CM

## 2013-06-18 DIAGNOSIS — J189 Pneumonia, unspecified organism: Secondary | ICD-10-CM | POA: Diagnosis present

## 2013-06-18 DIAGNOSIS — E871 Hypo-osmolality and hyponatremia: Secondary | ICD-10-CM

## 2013-06-18 DIAGNOSIS — C7949 Secondary malignant neoplasm of other parts of nervous system: Secondary | ICD-10-CM

## 2013-06-18 DIAGNOSIS — D849 Immunodeficiency, unspecified: Secondary | ICD-10-CM

## 2013-06-18 DIAGNOSIS — I5022 Chronic systolic (congestive) heart failure: Secondary | ICD-10-CM | POA: Diagnosis present

## 2013-06-18 DIAGNOSIS — Z66 Do not resuscitate: Secondary | ICD-10-CM | POA: Diagnosis present

## 2013-06-18 DIAGNOSIS — Z9981 Dependence on supplemental oxygen: Secondary | ICD-10-CM

## 2013-06-18 LAB — PRO B NATRIURETIC PEPTIDE: Pro B Natriuretic peptide (BNP): 298.9 pg/mL — ABNORMAL HIGH (ref 0–125)

## 2013-06-18 LAB — CBC
HEMATOCRIT: 37 % — AB (ref 39.0–52.0)
Hemoglobin: 12.2 g/dL — ABNORMAL LOW (ref 13.0–17.0)
MCH: 33 pg (ref 26.0–34.0)
MCHC: 33 g/dL (ref 30.0–36.0)
MCV: 100 fL (ref 78.0–100.0)
PLATELETS: 222 10*3/uL (ref 150–400)
RBC: 3.7 MIL/uL — AB (ref 4.22–5.81)
RDW: 15.5 % (ref 11.5–15.5)
WBC: 7 10*3/uL (ref 4.0–10.5)

## 2013-06-18 LAB — BASIC METABOLIC PANEL
BUN: 19 mg/dL (ref 6–23)
CALCIUM: 9.6 mg/dL (ref 8.4–10.5)
CHLORIDE: 96 meq/L (ref 96–112)
CO2: 33 meq/L — AB (ref 19–32)
Creatinine, Ser: 1 mg/dL (ref 0.50–1.35)
GFR calc Af Amer: 83 mL/min — ABNORMAL LOW (ref >90)
GFR calc non Af Amer: 72 mL/min — ABNORMAL LOW (ref >90)
GLUCOSE: 142 mg/dL — AB (ref 70–99)
POTASSIUM: 4.4 meq/L (ref 3.7–5.3)
SODIUM: 137 meq/L (ref 137–147)

## 2013-06-18 LAB — POCT I-STAT TROPONIN I: TROPONIN I, POC: 0.01 ng/mL (ref 0.00–0.08)

## 2013-06-18 LAB — DIGOXIN LEVEL: Digoxin Level: 1.4 ng/mL (ref 0.8–2.0)

## 2013-06-18 MED ORDER — ONDANSETRON HCL 4 MG/2ML IJ SOLN: 4.0000 mg | Freq: Four times a day (QID) | INTRAMUSCULAR | Status: DC | PRN

## 2013-06-18 MED ORDER — ENOXAPARIN SODIUM 60 MG/0.6ML ~~LOC~~ SOLN
80.0000 mg | SUBCUTANEOUS | Status: DC
  Administered 2013-06-18 – 2013-06-19 (×2): 80 mg via SUBCUTANEOUS
  Filled 2013-06-18 (×3): qty 1.2

## 2013-06-18 MED ORDER — GUAIFENESIN ER 600 MG PO TB12
600.0000 mg | ORAL_TABLET | Freq: Every day | ORAL | Status: DC
  Administered 2013-06-18 – 2013-06-20 (×3): 600 mg via ORAL
  Filled 2013-06-18 (×3): qty 1

## 2013-06-18 MED ORDER — SODIUM CHLORIDE 0.9 % IV SOLN: 250.0000 mL | INTRAVENOUS | Status: DC | PRN

## 2013-06-18 MED ORDER — SODIUM CHLORIDE 0.9 % IJ SOLN: 3.0000 mL | INTRAMUSCULAR | Status: DC | PRN

## 2013-06-18 MED ORDER — ALBUTEROL SULFATE (2.5 MG/3ML) 0.083% IN NEBU: 5.0000 mg | INHALATION_SOLUTION | RESPIRATORY_TRACT | Status: DC | PRN

## 2013-06-18 MED ORDER — FUROSEMIDE 10 MG/ML IJ SOLN
30.0000 mg | Freq: Once | INTRAMUSCULAR | Status: AC
  Administered 2013-06-18: 30 mg via INTRAVENOUS
  Filled 2013-06-18: qty 4

## 2013-06-18 MED ORDER — ADULT MULTIVITAMIN W/MINERALS CH
1.0000 | ORAL_TABLET | Freq: Every morning | ORAL | Status: DC
  Administered 2013-06-19 – 2013-06-20 (×2): 1 via ORAL
  Filled 2013-06-18 (×2): qty 1

## 2013-06-18 MED ORDER — IPRATROPIUM BROMIDE 0.02 % IN SOLN
0.5000 mg | Freq: Four times a day (QID) | RESPIRATORY_TRACT | Status: DC
  Administered 2013-06-18 – 2013-06-20 (×7): 0.5 mg via RESPIRATORY_TRACT
  Filled 2013-06-18 (×7): qty 2.5

## 2013-06-18 MED ORDER — IPRATROPIUM-ALBUTEROL 0.5-2.5 (3) MG/3ML IN SOLN
3.0000 mL | Freq: Once | RESPIRATORY_TRACT | Status: AC
  Administered 2013-06-18: 3 mL via RESPIRATORY_TRACT
  Filled 2013-06-18: qty 3

## 2013-06-18 MED ORDER — ACETAMINOPHEN 325 MG PO TABS
650.0000 mg | ORAL_TABLET | Freq: Four times a day (QID) | ORAL | Status: DC | PRN
Start: 2013-06-18 — End: 2013-06-20

## 2013-06-18 MED ORDER — VITAMIN C 500 MG PO TABS
1000.0000 mg | ORAL_TABLET | Freq: Every day | ORAL | Status: DC
  Administered 2013-06-19 – 2013-06-20 (×2): 1000 mg via ORAL
  Filled 2013-06-18 (×2): qty 2

## 2013-06-18 MED ORDER — LEVALBUTEROL HCL 0.63 MG/3ML IN NEBU: 0.6300 mg | INHALATION_SOLUTION | RESPIRATORY_TRACT | Status: DC | PRN

## 2013-06-18 MED ORDER — CARVEDILOL 3.125 MG PO TABS
3.1250 mg | ORAL_TABLET | Freq: Two times a day (BID) | ORAL | Status: DC
  Administered 2013-06-18 – 2013-06-20 (×4): 3.125 mg via ORAL
  Filled 2013-06-18 (×5): qty 1

## 2013-06-18 MED ORDER — OXYCODONE HCL 5 MG PO TABS
5.0000 mg | ORAL_TABLET | Freq: Four times a day (QID) | ORAL | Status: DC | PRN
  Administered 2013-06-20: 5 mg via ORAL
  Filled 2013-06-18: qty 1

## 2013-06-18 MED ORDER — ALBUTEROL SULFATE (2.5 MG/3ML) 0.083% IN NEBU
2.5000 mg | INHALATION_SOLUTION | RESPIRATORY_TRACT | Status: DC | PRN
Start: 2013-06-18 — End: 2013-06-18

## 2013-06-18 MED ORDER — LEVALBUTEROL HCL 0.63 MG/3ML IN NEBU
0.6300 mg | INHALATION_SOLUTION | Freq: Four times a day (QID) | RESPIRATORY_TRACT | Status: DC
  Administered 2013-06-18 – 2013-06-20 (×7): 0.63 mg via RESPIRATORY_TRACT
  Filled 2013-06-18 (×16): qty 3

## 2013-06-18 MED ORDER — ENSURE COMPLETE PO LIQD
237.0000 mL | Freq: Two times a day (BID) | ORAL | Status: DC
  Administered 2013-06-19 – 2013-06-20 (×3): 237 mL via ORAL

## 2013-06-18 MED ORDER — FOLIC ACID 1 MG PO TABS
1.0000 mg | ORAL_TABLET | Freq: Every day | ORAL | Status: DC
  Administered 2013-06-19 – 2013-06-20 (×2): 1 mg via ORAL
  Filled 2013-06-18 (×2): qty 1

## 2013-06-18 MED ORDER — DIGOXIN 125 MCG PO TABS
0.1250 mg | ORAL_TABLET | Freq: Every day | ORAL | Status: DC
  Administered 2013-06-19 – 2013-06-20 (×2): 0.125 mg via ORAL
  Filled 2013-06-18 (×2): qty 1

## 2013-06-18 MED ORDER — PREDNISONE 20 MG PO TABS
40.0000 mg | ORAL_TABLET | Freq: Every day | ORAL | Status: DC
  Administered 2013-06-19 – 2013-06-20 (×2): 40 mg via ORAL
  Filled 2013-06-18 (×4): qty 2

## 2013-06-18 MED ORDER — LEVOFLOXACIN IN D5W 500 MG/100ML IV SOLN
500.0000 mg | INTRAVENOUS | Status: DC
  Administered 2013-06-18: 500 mg via INTRAVENOUS
  Filled 2013-06-18: qty 100

## 2013-06-18 MED ORDER — SODIUM CHLORIDE 0.9 % IJ SOLN
3.0000 mL | Freq: Two times a day (BID) | INTRAMUSCULAR | Status: DC
  Administered 2013-06-19: 3 mL via INTRAVENOUS

## 2013-06-18 MED ORDER — METHYLPREDNISOLONE SODIUM SUCC 125 MG IJ SOLR
125.0000 mg | Freq: Once | INTRAMUSCULAR | Status: AC
  Administered 2013-06-18: 125 mg via INTRAVENOUS
  Filled 2013-06-18: qty 2

## 2013-06-18 MED ORDER — OXYCODONE-ACETAMINOPHEN 10-325 MG PO TABS: 1.0000 | ORAL_TABLET | Freq: Four times a day (QID) | ORAL | Status: DC | PRN

## 2013-06-18 MED ORDER — ACETAMINOPHEN 650 MG RE SUPP
650.0000 mg | Freq: Four times a day (QID) | RECTAL | Status: DC | PRN
Start: 2013-06-18 — End: 2013-06-20

## 2013-06-18 MED ORDER — ONDANSETRON HCL 4 MG PO TABS: 4.0000 mg | ORAL_TABLET | Freq: Four times a day (QID) | ORAL | Status: DC | PRN

## 2013-06-18 MED ORDER — LEVOFLOXACIN 500 MG PO TABS
500.0000 mg | ORAL_TABLET | Freq: Every day | ORAL | Status: DC
  Administered 2013-06-19 – 2013-06-20 (×2): 500 mg via ORAL
  Filled 2013-06-18 (×2): qty 1

## 2013-06-18 MED ORDER — OXYCODONE-ACETAMINOPHEN 5-325 MG PO TABS
1.0000 | ORAL_TABLET | Freq: Four times a day (QID) | ORAL | Status: DC | PRN
  Administered 2013-06-18 – 2013-06-20 (×3): 1 via ORAL
  Filled 2013-06-18 (×3): qty 1

## 2013-06-18 MED ORDER — SODIUM CHLORIDE 0.9 % IJ SOLN
3.0000 mL | Freq: Two times a day (BID) | INTRAMUSCULAR | Status: DC
  Administered 2013-06-18 – 2013-06-20 (×4): 3 mL via INTRAVENOUS

## 2013-06-18 MED ORDER — VITAMIN D3 25 MCG (1000 UNIT) PO TABS
1000.0000 [IU] | ORAL_TABLET | Freq: Every morning | ORAL | Status: DC
  Administered 2013-06-19 – 2013-06-20 (×2): 1000 [IU] via ORAL
  Filled 2013-06-18 (×2): qty 1

## 2013-06-18 MED ORDER — LEVOFLOXACIN 750 MG PO TABS: 750.0000 mg | ORAL_TABLET | Freq: Every day | ORAL | Status: DC

## 2013-06-18 NOTE — ED Notes (Signed)
Patient transported to X-ray 

## 2013-06-18 NOTE — ED Notes (Signed)
Pt presents to ED from home where he lives with his wife.  Pt c/o increased SOB with exertion over past several days.  Pt's lung sounds are diminished.  Chest rise/fall equal bilat and breathing mildly labored.  Pt has good peripheral pulses in all 4 extremities. Pt denies N/V/D and fever.  Pt reports cough productive of yellow sputum.

## 2013-06-18 NOTE — Progress Notes (Signed)
ANTIBIOTIC CONSULT NOTE - INITIAL  Pharmacy Consult for:  Levaquin Indication:  Exacerbation of COPD  No Known Allergies  Patient Measurements: 06/13/2013 - Height 66 inches   Weight 57 kg  Vital Signs: Temp: 97.6 F (36.4 C) (02/07 1022) Temp src: Oral (02/07 1022) BP: 132/63 mmHg (02/07 1231) Pulse Rate: 94 (02/07 1231)  Labs:  Recent Labs  06/18/13 1041  WBC 7.0  HGB 12.2*  PLT 222  CREATININE 1.00   The estimated creatinine clearance is 52.3 ml/min using SCr 1 mg/dL and the Cockroft-Gault equation.   Medical History: Past Medical History  Diagnosis Date  . Substance abuse     TOBACCO  . Lung mass   . 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 Brain metastases 08/18/12    mri-  . HOH (hard of hearing)     wears hearing aids  . COPD (chronic obstructive pulmonary disease)   . Systolic heart failure     EF of 30 to 35% per echo in June 2014  . Lung cancer   . Legionella pneumonia   . CHF (congestive heart failure)     Medications:  Pending  Assessment: Asked to assist with Levaquin therapy for this 73 year-old male with metastatic lung cancer, complaint of worsening shortness of breath, and exacerbation of COPD.  Goal of Therapy:  Eradication of infection  Plan:  Levaquin 500 mg IV every 24 hours.  CrescentPh. 06/18/2013 1:52 PM

## 2013-06-18 NOTE — ED Notes (Signed)
Pt comes from home where he lives with his wife.  Pt has hx of lung cancer in left lung.  Pt's last chemotherapy was before Christmas 2014.  He was hospitalized on December 19 for PNA in left lung.  Pt states over past few days he has had increased SOB, especially with exertion.  Pt states he has had cough productive of yellow sputum.  Pt denies N/V/D and fever.

## 2013-06-18 NOTE — ED Provider Notes (Signed)
CSN: [id number]     Arrival date & time 06/18/13  1010 History   First MD Initiated Contact with Patient 06/18/13 1014     Chief Complaint  Patient presents with  . Shortness of Breath    HPI Patient presents to the emergency room with complaints of worsening shortness of breath.   Pt has chronic copd and metastatic lung cancer. The patient also has a history of pleural effusions.  Patient has chronic shortness of breath and dyspnea on exertion but usually can walk without difficulty while on supplemental oxygen doing his daily activities within the house the .  Wednesday noticed it getting worse again.  Coughing up yellow sputum. Can only walk small amounts without getting short of breath.    Not on any chemo treatments.  Has history of a very small PE. He currently is on anticoagulants. Patient states previously when he had an episode of shortness of breath it was associated with pleural effusion and pneumonia. Patient denies any fevers. He has not been coughing. He has not noticed any leg swelling. Past Medical History  Diagnosis Date  . Substance abuse     TOBACCO  . Lung mass   . 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 Brain metastases 08/18/12    mri-  . HOH (hard of hearing)     wears hearing aids  . COPD (chronic obstructive pulmonary disease)   . Systolic heart failure     EF of 30 to 35% per echo in June 2014  . Lung cancer   . Legionella pneumonia   . CHF (congestive heart failure)    Past Surgical History  Procedure Laterality Date  . Wrist surgery    . Lung biopsy Left 08/17/12    LUL lung mass-adenocarcinoma  . Ganglion cyst excision    . Nose surgery     Family History  Problem Relation Age of Onset  . COPD Father   . Cancer Sister   . Diabetes Brother    History  Substance Use Topics  . Smoking status: Former Smoker -- 1.00 packs/day for 60 years    Types: Cigarettes    Quit date: 09/27/2012  . Smokeless tobacco: Never Used  . Alcohol Use: No     Review of Systems  All other systems reviewed and are negative.    Allergies  Review of patient's allergies indicates no known allergies.  Home Medications   Current Outpatient Rx  Name  Route  Sig  Dispense  Refill  . Ascorbic Acid (VITAMIN C) 1000 MG tablet   Oral   Take 1,000 mg by mouth daily.         . carvedilol (COREG) 3.125 MG tablet   Oral   Take 1 tablet (3.125 mg total) by mouth 2 (two) times daily.   180 tablet   3   . cetirizine (ZYRTEC) 10 MG tablet   Oral   Take 10 mg by mouth daily.         . cholecalciferol (VITAMIN D) 1000 UNITS tablet   Oral   Take 1,000 Units by mouth every morning.         . digoxin (LANOXIN) 0.125 MG tablet   Oral   Take 1 tablet (0.125 mg total) by mouth daily.   90 tablet   3   . enoxaparin (LOVENOX) 60 MG/0.6ML injection   Subcutaneous   Inject 0.6 mLs (60 mg total) into the skin daily.   90 Syringe  1   . feeding supplement (ENSURE COMPLETE) LIQD   Oral   Take 237 mLs by mouth 2 (two) times daily between meals.   30 Bottle   5   . folic acid (FOLVITE) 1 MG tablet      TAKE 1 TABLET (1 MG TOTAL) BY MOUTH DAILY.   90 tablet   1     PATIENT WANTS 90 DAY SUPPLY   . guaiFENesin (MUCINEX) 600 MG 12 hr tablet   Oral   Take 600 mg by mouth daily.         Marland Kitchen levalbuterol (XOPENEX) 0.63 MG/3ML nebulizer solution   Nebulization   Take 3 mLs (0.63 mg total) by nebulization every 4 (four) hours as needed for wheezing.   3 mL   120     Dr Julien Nordmann is only ordering one month supply then  ...   . Multiple Vitamin (MULTIVITAMIN WITH MINERALS) TABS   Oral   Take 1 tablet by mouth every morning.         Marland Kitchen oxyCODONE-acetaminophen (PERCOCET) 10-325 MG per tablet   Oral   Take 1 tablet by mouth every 6 (six) hours as needed for pain.   60 tablet   0   . OXYGEN-HELIUM IN   Inhalation   Inhale 3 L into the lungs daily.         . predniSONE (DELTASONE) 10 MG tablet   Oral   Take 5 mg by mouth daily  with breakfast.         . prochlorperazine (COMPAZINE) 10 MG tablet   Oral   Take 1 tablet (10 mg total) by mouth every 6 (six) hours as needed for nausea or vomiting.   30 tablet   0    BP 132/63  Pulse 94  Temp(Src) 97.6 F (36.4 C) (Oral)  Resp 19  SpO2 96% Physical Exam  Nursing note and vitals reviewed. Constitutional: No distress.  For now, elderly  HENT:  Head: Normocephalic and atraumatic.  Right Ear: External ear normal.  Left Ear: External ear normal.  Eyes: Conjunctivae are normal. Right eye exhibits no discharge. Left eye exhibits no discharge. No scleral icterus.  Neck: Neck supple. No tracheal deviation present.  Cardiovascular: Normal rate, regular rhythm and intact distal pulses.   Pulmonary/Chest: Effort normal. No stridor. No respiratory distress. He has no wheezes. He has rales.  Crackles on the left side, decreased breath sounds with crackles in the right, no wheezing  Abdominal: Soft. Bowel sounds are normal. He exhibits no distension. There is no tenderness. There is no rebound and no guarding.  Musculoskeletal: He exhibits no edema and no tenderness.  Neurological: He is alert. He has normal strength. No cranial nerve deficit (no facial droop, extraocular movements intact, no slurred speech) or sensory deficit. He exhibits normal muscle tone. He displays no seizure activity. Coordination normal.  Skin: Skin is warm and dry. No rash noted.  Psychiatric: He has a normal mood and affect.    ED Course  Procedures (including critical care time) Labs Review Labs Reviewed  BASIC METABOLIC PANEL - Abnormal; Notable for the following:    CO2 33 (*)    Glucose, Bld 142 (*)    GFR calc non Af Amer 72 (*)    GFR calc Af Amer 83 (*)    All other components within normal limits  CBC - Abnormal; Notable for the following:    RBC 3.70 (*)    Hemoglobin 12.2 (*)  HCT 37.0 (*)    All other components within normal limits  PRO B NATRIURETIC PEPTIDE -  Abnormal; Notable for the following:    Pro B Natriuretic peptide (BNP) 298.9 (*)    All other components within normal limits  DIGOXIN LEVEL  POCT I-STAT TROPONIN I   Imaging Review Dg Chest 2 View  06/18/2013   CLINICAL DATA:  Shortness of breath  EXAM: CHEST  2 VIEW  COMPARISON:  05/02/2013, 06/15/2013  FINDINGS: Stable consolidation is noted within the left lung when compare with the recent CT examination. Emphysematous changes are noted on the right. No acute infiltrate is seen on the right. The osseous structures are within normal limits.  IMPRESSION: No acute abnormality is identified. The overall appearance is stable from the recent CT examination.   Electronically Signed   By: Inez Catalina M.D.   On: 06/18/2013 11:18   Mr Jeri Cos [place] Contrast  06/16/2013   CLINICAL DATA:  Non-small-cell lung cancer. Followup metastatic disease.  EXAM: MRI HEAD WITHOUT AND WITH CONTRAST  TECHNIQUE: Multiplanar, multiecho pulse sequences of the brain and surrounding structures were obtained without and with intravenous contrast.  CONTRAST:  88mL MULTIHANCE GADOBENATE DIMEGLUMINE 529 MG/ML IV SOLN  COMPARISON:  MRI 03/22/2013, 11/30/2012  FINDINGS: Stereotactic radiosurgery protocol at 3 Tesla.  New enhancing lesions are seen consistent with metastatic deposits.  2 mm enhancing lesion left thalamus is new  2 mm enhancing lesion in the right frontal cortex is new  2 x 3 mm enhancing lesion in the left parietal cortex is new.  3 x 5 mm cyst in the left mid cerebellum is new however does not show enhancement. This could be a chronic infarct versus nonenhancing metastatic disease. 1 mm cystic area in the right cerebellum is unchanged.  Left anterior cerebellar enhancing lesion has resolved and no longer enhances or shows significant edema. Small enhancing lesions previously identified in the right parietal lobe, left frontal lobe, and left parietal lobe are no longer visualized.  Generalized atrophy. Ventricle size is  normal. No shift of the midline structures. No significant edema in the brain. Negative for acute infarct.  IMPRESSION: There are 3 new enhancing lesions consistent with metastatic disease, involving the right frontal cortex, left parietal cortex, and left thalamus.  There is a new cystic area in the left cerebellum which does not enhance and may represent a chronic infarct versus nonenhancing metastatic disease.  Other previously treated lesions no longer visualized.   Electronically Signed   By: Franchot Gallo M.D.   On: 06/16/2013 15:33   EXAM:  CT CHEST, ABDOMEN, AND PELVIS WITH CONTRAST  TECHNIQUE:  From 2/4 Multidetector CT imaging of the chest, abdomen and pelvis was  performed following the standard protocol during bolus  administration of intravenous contrast.  CONTRAST: 162mL OMNIPAQUE IOHEXOL 300 MG/ML SOLN  COMPARISON: 04/18/2013  FINDINGS:  RECIST 1.1  Target Lesions:  1. Masslike opacity in the anterior left lung apex measuring 3.1 cm  on image 12 of series 2  2. Left anterior pleural metastatic disease measuring 2.9 cm on  image 44 of series 2  3. Left abdominal paraaortic retroperitoneal lymph node measuring  1.3 cm on image 60 of series 2  Non-target Lesions:  1. 7 mm right middle lobe pulmonary nodule on image 40 of series 4  CT CHEST FINDINGS  Malignant left pleural effusion has decreased in size since previous  study, however increased in nodular soft tissue density is seen  throughout the  left pleural space.  Increased consolidation is seen throughout the left lower lobe with  central air bronchograms, and there is also increased airspace  disease seen involving a significant portion of the left upper lobe.  Previously noted masslike opacity in the anterior left lung apex  currently measures 3.1 x 3.1 cm compared to 2.6 x 2.6 cm previously.  7 mm pulmonary nodule in the right middle lobe on image 40 remains  unchanged. No new or enlarging pulmonary nodules seen in  the right  lung and there is no evidence of right-sided pleural effusion.  No evidence of hilar or mediastinal lymphadenopathy. No adenopathy  noted elsewhere within the thorax. No suspicious bone lesions  identified.  EKG Interpretation    Date/Time:  Saturday June 18 2013 10:25:38 EST Ventricular Rate:  90 PR Interval:  132 QRS Duration: 83 QT Interval:  340 QTC Calculation: 416 R Axis:   24 Text Interpretation:  Sinus rhythm Anteroseptal infarct, old Nonspecific repol abnormality, lateral leads No significant change since last tracing Confirmed by Bryceson Grape  MD-J, Esgar Barnick ([id number]) on 06/18/2013 10:31:08 AM           Medications  albuterol (PROVENTIL) (2.5 MG/3ML) 0.083% nebulizer solution 5 mg (not administered)  ipratropium-albuterol (DUONEB) 0.5-2.5 (3) MG/3ML nebulizer solution 3 mL (3 mLs Nebulization Given 06/18/13 1049)  methylPREDNISolone sodium succinate (SOLU-MEDROL) 125 mg/2 mL injection 125 mg (125 mg Intravenous Given 06/18/13 1152)    1155  Reviewed findings with patient.   No PNA or pleural effusion on cxr.  O2 sat 97 % at rest while on 4l [place] o2 (his home regimen) 1250  Attempted to ambulate but saturations dropped to 80s while oxygen MDM   1. COPD (chronic obstructive pulmonary disease)   2. Lung cancer    Suspect his shortness of breath is related to COPD exacerbation.  No Pna or pleural effusion noted on xray.  Pt is already on lovenox.  He had a ct scan on 2/4 that did not show PE but consolidation and tumors associated with his CA.  Pt given steroids and nebs.  Will start abx for COPD.  Admit for further treatment.    Kathalene Frames, MD 06/18/13 1255

## 2013-06-18 NOTE — H&P (Signed)
[facility] History and Physical  Luis Payne GEX:[id number] DOB: 1940/12/21 DOA: 06/18/2013  Referring physician: EDP PCP: Glo Herring., MD   Chief Complaint: Worsening shortness of breath  HPI: Luis Payne is a 73 y.o. male with past medical history significant for lung cancer with brain metastasis, COPD -home O2 dependent on 4 L, chronic systolic congestive heart failure, PE on chronic Lovenox who presents with above complaints. He states that he's had progressive worsening of shortness of breath for the past 3-4 days. He admits to a cough productive of small amounts of yellowish sputum. he denies fevers. He also denies leg swelling and no PND reported. He was seen in the ED a chest x-ray showed no acute infiltrates. Was noted that patient had a CT scan of his chest with contrast on 2/4 which showed increased metastatic tumor or burden throughout the left pleural space but with a decreased size of left pleural effusion, increased airspace disease also noted in left upper and lower lobes. BNP was 298 and WBC within normal limits at 7.0. He was treated with Solu-Medrol, Levaquin, nebs in the ED and is admitted for further evaluation and management.    Review of Systems The patient denies anorexia, fever, weight loss,, vision loss, decreased hearing, hoarseness, chest pain, syncope, , peripheral edema, balance deficits, hemoptysis, abdominal pain, melena, hematochezia, severe indigestion/heartburn, hematuria, incontinence.,    Past Medical History  Diagnosis Date  . Substance abuse     TOBACCO  . Lung mass   . 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 Brain metastases 08/18/12    mri-  . HOH (hard of hearing)     wears hearing aids  . COPD (chronic obstructive pulmonary disease)   . Systolic heart failure     EF of 30 to 35% per echo in June 2014  . Lung cancer   . Legionella pneumonia   . CHF (congestive heart failure)    Past Surgical History  Procedure Laterality  Date  . Wrist surgery    . Lung biopsy Left 08/17/12    LUL lung mass-adenocarcinoma  . Ganglion cyst excision    . Nose surgery     Social History:  reports that he quit smoking about 8 months ago. His smoking use included Cigarettes. He has a 60 pack-year smoking history. He has never used smokeless tobacco. He reports that he does not drink alcohol or use illicit drugs.  No Known Allergies  Family History  Problem Relation Age of Onset  . COPD Father   . Cancer Sister   . Diabetes Brother      Prior to Admission medications   Medication Sig Start Date End Date Taking? Authorizing Provider  Ascorbic Acid (VITAMIN C) 1000 MG tablet Take 1,000 mg by mouth daily.   Yes Historical Provider, MD  carvedilol (COREG) 3.125 MG tablet Take 1 tablet (3.125 mg total) by mouth 2 (two) times daily. 12/29/12  Yes Burtis Junes, NP  cetirizine (ZYRTEC) 10 MG tablet Take 10 mg by mouth daily.   Yes Historical Provider, MD  cholecalciferol (VITAMIN D) 1000 UNITS tablet Take 1,000 Units by mouth every morning.   Yes Historical Provider, MD  digoxin (LANOXIN) 0.125 MG tablet Take 1 tablet (0.125 mg total) by mouth daily. 12/29/12  Yes Burtis Junes, NP  enoxaparin (LOVENOX) 60 MG/0.6ML injection Inject 0.6 mLs (60 mg total) into the skin daily. 05/07/13  Yes Estela Leonie Green, MD  feeding supplement (ENSURE COMPLETE)  LIQD Take 237 mLs by mouth 2 (two) times daily between meals. 10/22/12  Yes Theodis Blaze, MD  folic acid (FOLVITE) 1 MG tablet TAKE 1 TABLET (1 MG TOTAL) BY MOUTH DAILY. 01/27/13  Yes Curt Bears, MD  guaiFENesin (MUCINEX) 600 MG 12 hr tablet Take 600 mg by mouth daily.   Yes Historical Provider, MD  levalbuterol Penne Lash) 0.63 MG/3ML nebulizer solution Take 3 mLs (0.63 mg total) by nebulization every 4 (four) hours as needed for wheezing. 11/02/12  Yes Curt Bears, MD  Multiple Vitamin (MULTIVITAMIN WITH MINERALS) TABS Take 1 tablet by mouth every morning.   Yes Historical  Provider, MD  oxyCODONE-acetaminophen (PERCOCET) 10-325 MG per tablet Take 1 tablet by mouth every 6 (six) hours as needed for pain. 05/31/13  Yes Curt Bears, MD  OXYGEN-HELIUM IN Inhale 3 L into the lungs daily.   Yes Historical Provider, MD  predniSONE (DELTASONE) 10 MG tablet Take 5 mg by mouth daily with breakfast.   Yes Historical Provider, MD  prochlorperazine (COMPAZINE) 10 MG tablet Take 1 tablet (10 mg total) by mouth every 6 (six) hours as needed for nausea or vomiting. 04/26/13  Yes Curt Bears, MD   Physical Exam: Filed Vitals:   06/18/13 1436  BP: 132/66  Pulse: 99  Temp: 97.6 F (36.4 C)  Resp: 18    BP 132/66  Pulse 99  Temp(Src) 97.6 F (36.4 C) (Oral)  Resp 18  Ht 5\' 6"  (1.676 m)  Wt 56.972 kg (125 lb 9.6 oz)  BMI 20.28 kg/m2  SpO2 94% Constitutional: Vital signs reviewed.  Patient is a well-developed and well-nourished  in no acute distress and cooperative with exam. Alert and oriented x3.  Head: Normocephalic and atraumatic Mouth: no erythema or exudates, MMM Eyes: PERRL, EOMI, conjunctivae normal, No scleral icterus.  Neck: Supple, Trachea midline normal ROM, No JVD, mass, thyromegaly, or carotid bruit present.  Cardiovascular: RRR, S1 normal, S2 normal, no MRG, pulses symmetric and intact bilaterally Pulmonary/Chest: Decreased air entry bilaterally, no wheezes, rales, or rhonchi. Decreased breath sounds at the bases Abdominal: Soft. Non-tender, non-distended, bowel sounds are normal, no masses, organomegaly, or guarding present.  Extremities: No cyanosis and no edema  Neurological: A&O x3, Strength is normal and symmetric bilaterally, cranial nerve II-XII are grossly intact, no focal motor deficit, sensory intact to light touch bilaterally.  Skin: Warm, dry and intact. No rash.  Psychiatric: Normal mood and affect. speech and behavior is normal. Judgment and thought content normal.               Labs on Admission:  Basic Metabolic  Panel:  Recent Labs Lab 06/13/13 0905 06/13/13 0908 06/18/13 1041  NA 142  --  137  K 4.0  --  4.4  CL  --   --  96  CO2 31*  --  33*  GLUCOSE 131  --  142*  BUN 25.9  --  19  CREATININE 1.0  --  1.00  CALCIUM 10.3  --  9.6  MG 2.0  --   --   PHOS  --  3.4  --    Liver Function Tests:  Recent Labs Lab 06/13/13 0905  AST 25  ALT 22  ALKPHOS 104  BILITOT 0.57  PROT 7.0  ALBUMIN 2.9*   No results found for this basename: LIPASE, AMYLASE,  in the last 168 hours No results found for this basename: AMMONIA,  in the last 168 hours CBC:  Recent Labs Lab 06/13/13 0905 06/18/13  1041  WBC 7.4 7.0  NEUTROABS 6.1  --   HGB 12.0* 12.2*  HCT 35.3* 37.0*  MCV 98.5* 100.0  PLT 234 222   Cardiac Enzymes: No results found for this basename: CKTOTAL, CKMB, CKMBINDEX, TROPONINI,  in the last 168 hours  BNP (last 3 results)  Recent Labs  06/18/13 1041  PROBNP 298.9*   CBG: No results found for this basename: GLUCAP,  in the last 168 hours  Radiological Exams on Admission: Dg Chest 2 View  06/18/2013   CLINICAL DATA:  Shortness of breath  EXAM: CHEST  2 VIEW  COMPARISON:  05/02/2013, 06/15/2013  FINDINGS: Stable consolidation is noted within the left lung when compare with the recent CT examination. Emphysematous changes are noted on the right. No acute infiltrate is seen on the right. The osseous structures are within normal limits.  IMPRESSION: No acute abnormality is identified. The overall appearance is stable from the recent CT examination.   Electronically Signed   By: Inez Catalina M.D.   On: 06/18/2013 11:18     Assessment/Plan Active Problems:  COPD with acute exacerbation+/-postobstructive pneumonia -As discussed above, chest x-ray today with no acute infiltrates but CT scan of 2/4 increased airspace disease  noted in left upper and lower lobes. -Will continue nebulized bronchodilators, steroids, antibiotics, Mucinex  -Continue supplemental oxygen Acute on  chronic respiratory failure -Secondary to above see management as discussed above   Malignant neoplasm of upper lobe, bronchus or lung/Brain metastases  -He is followed by Dr. Hale Drone add to his Epic consult list   Chronic systolic congestive heart failure -his BNP is only 298 and he does not appear grossly volume overloaded -Will give IV Lasix x1, follow and diurese as needed      Code Status: DNR Family Communication: Wife at bedside Disposition Plan: Admit to telemetry  Time spent: >68mins  [facility] Pager [phone]

## 2013-06-18 NOTE — ED Notes (Signed)
Pt's sats fell from 93% on 4L [place] to 83% on 4L [place] while walking around nurses' station.

## 2013-06-18 NOTE — ED Notes (Signed)
Pt did not arrive with any PIVs, wounds or lacerations.  All of these were documented with a last assessment day of today because he did not have any of them on arrival.

## 2013-06-18 NOTE — ED Notes (Signed)
Patient returned from X-ray 

## 2013-06-19 ENCOUNTER — Encounter: Payer: Self-pay | Admitting: Radiation Oncology

## 2013-06-19 DIAGNOSIS — J449 Chronic obstructive pulmonary disease, unspecified: Secondary | ICD-10-CM

## 2013-06-19 LAB — BASIC METABOLIC PANEL
BUN: 26 mg/dL — ABNORMAL HIGH (ref 6–23)
CO2: 32 mEq/L (ref 19–32)
Calcium: 9.9 mg/dL (ref 8.4–10.5)
Chloride: 95 mEq/L — ABNORMAL LOW (ref 96–112)
Creatinine, Ser: 1.01 mg/dL (ref 0.50–1.35)
GFR, EST AFRICAN AMERICAN: 82 mL/min — AB (ref >90)
GFR, EST NON AFRICAN AMERICAN: 71 mL/min — AB (ref >90)
Glucose, Bld: 129 mg/dL — ABNORMAL HIGH (ref 70–99)
POTASSIUM: 4.5 meq/L (ref 3.7–5.3)
Sodium: 138 mEq/L (ref 137–147)

## 2013-06-19 MED ORDER — FUROSEMIDE 10 MG/ML IJ SOLN
40.0000 mg | Freq: Once | INTRAMUSCULAR | Status: AC
  Administered 2013-06-19: 40 mg via INTRAVENOUS
  Filled 2013-06-19: qty 4

## 2013-06-19 NOTE — Progress Notes (Signed)
No show

## 2013-06-19 NOTE — Progress Notes (Signed)
Utilization review completed.  

## 2013-06-19 NOTE — Evaluation (Addendum)
Physical Therapy Evaluation Patient Details Name: Luis Payne MRN: [id number] DOB: Aug 15, 1940 Today's Date: 06/19/2013 Time: 0174-9449 PT Time Calculation (min): 20 min  PT Assessment / Plan / Recommendation History of Present Illness     Clinical Impression  *Pt admitted with COPD exacerbation, postobstructive PNA, CHF, h/o lung cancer with brain mets*. Pt currently with functional limitations due to the deficits listed below (see PT Problem List).  Pt will benefit from skilled PT to increase their independence and safety with mobility to allow discharge to the venue listed below.    Pt stated he has a pulse oximeter at home, instructed pt to monitor SaO2 with mobility and that he may need to titrate O2 up to maintain SaO2 greater than 90%. **    PT Assessment  Patient needs continued PT services    Follow Up Recommendations  No PT follow up    Does the patient have the potential to tolerate intense rehabilitation      Barriers to Discharge        Equipment Recommendations  None recommended by PT    Recommendations for Other Services     Frequency Min 3X/week    Precautions / Restrictions Precautions Precaution Comments: monitor SaO2, pt denies falls at home Restrictions Weight Bearing Restrictions: No   Pertinent Vitals/Pain **SATURATION QUALIFICATIONS: (This note is used to comply with regulatory documentation for home oxygen)  Patient Saturations on 4L O2 at Rest = 95*%  Patient Saturations on 4 L O2 while Ambulating = *85*%  Patient Saturations on 6* Liters of oxygen while Ambulating = *95**%  Please briefly explain why patient needs home oxygen:* to maintain appropriate SaO2 levels.   HR with walking 108 Pt reports chronic LBP, stated he didn't need pain medicine.      Mobility  Bed Mobility Overal bed mobility: Independent Transfers Overall transfer level: Independent Ambulation/Gait Ambulation/Gait assistance: Supervision Ambulation Distance (Feet):  80 Feet (80' x 2 with seated rest break of 2 minutes 2* decr SaO2/SOB) VCs for pursed lip breathing Assistive device: None Gait Pattern/deviations: WFL(Within Functional Limits) Gait velocity: WNL General Gait Details: SaO2 95% on 4L O2 at rest, dropped to 85% on 4L O2 [place] with walking, incr O2 to 6L and sats came up to 95%    Exercises     PT Diagnosis: Generalized weakness  PT Problem List: Cardiopulmonary status limiting activity;Decreased activity tolerance PT Treatment Interventions: Gait training;Patient/family education     PT Goals(Current goals can be found in the care plan section) Acute Rehab PT Goals Patient Stated Goal: return to prior activity level, pt was able to walk 20 minutes on 4L O2 at home, now can only walk "a couple minutes" PT Goal Formulation: With patient/family Time For Goal Achievement: 07/03/13 Potential to Achieve Goals: Good  Visit Information          Prior [place] expects to be discharged to:: Private residence Living Arrangements: Spouse/significant other Available Help at Discharge: Available 24 hours/day Type of Home: House Home Access: Stairs to enter [employer] of Steps: 3 Entrance Stairs-Rails: None Home Layout: One level Home Equipment: [place] - 2 wheels;Bedside commode;Shower seat, O2 -uses 4L at home Prior Function Level of Independence: Independent Communication Communication: HOH    Cognition  Cognition Arousal/Alertness: Awake/alert Behavior During Therapy: WFL for tasks assessed/performed Overall Cognitive Status: Within Functional Limits for tasks assessed    Extremity/Trunk Assessment Upper Extremity Assessment Upper Extremity Assessment: Overall WFL for tasks assessed Lower Extremity Assessment Lower  Extremity Assessment: Overall WFL for tasks assessed Cervical / Trunk Assessment Cervical / Trunk Assessment: Kyphotic   Balance    End of Session PT - End of  Session Equipment Utilized During Treatment: Oxygen Activity Tolerance: Patient tolerated treatment well Patient left: in chair;with call bell/phone within reach;with family/visitor present Nurse Communication: Mobility status  GP     Blondell Reveal Kistler 06/19/2013, 9:50 AM [phone]

## 2013-06-19 NOTE — Progress Notes (Signed)
[facility] PROGRESS NOTE  Luis Payne WLN:[id number] DOB: 06-Oct-1940 DOA: 06/18/2013 PCP: Glo Herring., MD  Assessment/Plan: COPD with acute exacerbation+/-postobstructive pneumonia  -As discussed above, chest x-ray 2/7 with no acute infiltrates but CT scan of 2/4 increased airspace disease noted in left upper and lower lobes.  - continue nebulized bronchodilators, steroids, antibiotics, Mucinex  -Continue supplemental oxygen  -Patient deciding on 4 L to 85% with ambulation, we'll again give another dose of IV Lasix and follow. Acute on chronic respiratory failure  -Secondary to above see management as discussed above  Malignant neoplasm of upper lobe, bronchus or lung/Brain metastases  -He is followed by Dr. Julien Nordmann and Dr. Asa Lente add to their Epic consult lists  Chronic systolic congestive heart failure  -his BNP is only 298 and he does not appear grossly volume overloaded  -Will again give IV Lasix x1 as above, follow and diurese as needed    Code Status: Full Family Communication: wife at bedside Disposition Plan: To home when medically ready   Consultants:  none  Procedures:  none  Antibiotics:  Levaquin  HPI/Subjective: States breathing better this a.m., still desating to 85% on 4 L with ambulation  Objective: Filed Vitals:   06/19/13 0938  BP:   Pulse: 108  Temp:   Resp:     Intake/Output Summary (Last 24 hours) at 06/19/13 1147 Last data filed at 06/19/13 1052  Gross per 24 hour  Intake      0 ml  Output    650 ml  Net   -650 ml   Filed Weights   06/18/13 1333  Weight: 56.972 kg (125 lb 9.6 oz)    Exam:  General: alert & oriented x  In NAD Cardiovascular: RRR, nl S1 s2 Respiratory: Decreased air entry bilaterally, no wheezes. Abdomen: soft +BS NT/ND, no masses palpable Extremities: Trace edema, no cyanosis    Data Reviewed: Basic Metabolic Panel:  Recent Labs Lab 06/13/13 0905 06/13/13 0908 06/18/13 1041  NA 142   --  137  K 4.0  --  4.4  CL  --   --  96  CO2 31*  --  33*  GLUCOSE 131  --  142*  BUN 25.9  --  19  CREATININE 1.0  --  1.00  CALCIUM 10.3  --  9.6  MG 2.0  --   --   PHOS  --  3.4  --    Liver Function Tests:  Recent Labs Lab 06/13/13 0905  AST 25  ALT 22  ALKPHOS 104  BILITOT 0.57  PROT 7.0  ALBUMIN 2.9*   No results found for this basename: LIPASE, AMYLASE,  in the last 168 hours No results found for this basename: AMMONIA,  in the last 168 hours CBC:  Recent Labs Lab 06/13/13 0905 06/18/13 1041  WBC 7.4 7.0  NEUTROABS 6.1  --   HGB 12.0* 12.2*  HCT 35.3* 37.0*  MCV 98.5* 100.0  PLT 234 222   Cardiac Enzymes: No results found for this basename: CKTOTAL, CKMB, CKMBINDEX, TROPONINI,  in the last 168 hours BNP (last 3 results)  Recent Labs  06/18/13 1041  PROBNP 298.9*   CBG: No results found for this basename: GLUCAP,  in the last 168 hours  No results found for this or any previous visit (from the past 240 hour(s)).   Studies: Dg Chest 2 View  06/18/2013   CLINICAL DATA:  Shortness of breath  EXAM: CHEST  2 VIEW  COMPARISON:  05/02/2013, 06/15/2013  FINDINGS: Stable consolidation is noted within the left lung when compare with the recent CT examination. Emphysematous changes are noted on the right. No acute infiltrate is seen on the right. The osseous structures are within normal limits.  IMPRESSION: No acute abnormality is identified. The overall appearance is stable from the recent CT examination.   Electronically Signed   By: Inez Catalina M.D.   On: 06/18/2013 11:18    Scheduled Meds: . carvedilol  3.125 mg Oral BID  . cholecalciferol  1,000 Units Oral q morning - 10a  . digoxin  0.125 mg Oral Daily  . enoxaparin  80 mg Subcutaneous Q24H  . feeding supplement (ENSURE COMPLETE)  237 mL Oral BID BM  . folic acid  1 mg Oral Daily  . guaiFENesin  600 mg Oral Daily  . ipratropium  0.5 mg Nebulization Q6H  . levalbuterol  0.63 mg Nebulization Q6H  .  levofloxacin  500 mg Oral Daily  . multivitamin with minerals  1 tablet Oral q morning - 10a  . predniSONE  40 mg Oral Q breakfast  . sodium chloride  3 mL Intravenous Q12H  . sodium chloride  3 mL Intravenous Q12H  . vitamin C  1,000 mg Oral Daily   Continuous Infusions:   Active Problems:   Malignant neoplasm of upper lobe, bronchus or lung   Brain metastases   Chronic systolic congestive heart failure   COPD with acute exacerbation   COPD exacerbation    Time spent: [facility] Pager [phone]. If 7PM-7AM, please contact night-coverage at [email or web address], password [facility] 06/19/2013, 11:47 AM  LOS: 1 day

## 2013-06-20 ENCOUNTER — Other Ambulatory Visit: Payer: Self-pay

## 2013-06-20 ENCOUNTER — Ambulatory Visit
Admission: RE | Admit: 2013-06-20 | Discharge: 2013-06-20 | Disposition: A | Discharge status: Home / Self Care | Payer: Medicare Other | Source: Ambulatory Visit | Attending: Radiation Oncology | Admitting: Radiation Oncology

## 2013-06-20 DIAGNOSIS — C7949 Secondary malignant neoplasm of other parts of nervous system: Principal | ICD-10-CM

## 2013-06-20 DIAGNOSIS — I509 Heart failure, unspecified: Secondary | ICD-10-CM

## 2013-06-20 DIAGNOSIS — I5022 Chronic systolic (congestive) heart failure: Secondary | ICD-10-CM

## 2013-06-20 DIAGNOSIS — C7931 Secondary malignant neoplasm of brain: Secondary | ICD-10-CM

## 2013-06-20 LAB — BASIC METABOLIC PANEL
BUN: 35 mg/dL — ABNORMAL HIGH (ref 6–23)
CHLORIDE: 94 meq/L — AB (ref 96–112)
CO2: 34 mEq/L — ABNORMAL HIGH (ref 19–32)
Calcium: 9.4 mg/dL (ref 8.4–10.5)
Creatinine, Ser: 1.18 mg/dL (ref 0.50–1.35)
GFR calc non Af Amer: 59 mL/min — ABNORMAL LOW (ref >90)
GFR, EST AFRICAN AMERICAN: 68 mL/min — AB (ref >90)
Glucose, Bld: 102 mg/dL — ABNORMAL HIGH (ref 70–99)
Potassium: 4.3 mEq/L (ref 3.7–5.3)
SODIUM: 138 meq/L (ref 137–147)

## 2013-06-20 MED ORDER — LEVOFLOXACIN 500 MG PO TABS: 500.0000 mg | ORAL_TABLET | Freq: Every day | ORAL | Status: DC

## 2013-06-20 MED ORDER — PREDNISONE 20 MG PO TABS: ORAL_TABLET | ORAL | Status: DC

## 2013-06-20 MED ORDER — ENOXAPARIN SODIUM 80 MG/0.8ML ~~LOC~~ SOLN
80.0000 mg | SUBCUTANEOUS | Status: DC
  Filled 2013-06-20: qty 0.8

## 2013-06-20 NOTE — Progress Notes (Signed)
Patient ambulated in room with O2 extension cord for 5 minutes on 4.5 L nasal cannula and sats dropped to 90%.

## 2013-06-20 NOTE — Discharge Summary (Signed)
Physician Discharge Summary  RANON COVEN QIO:[id number] DOB: 1941/02/22 DOA: 06/18/2013  PCP: Glo Herring., MD  Admit date: 06/18/2013 Discharge date: 06/20/2013  Time spent: >30 minutes  Recommendations for Outpatient Follow-up:  Follow-up Information   Follow up with Glo Herring., MD. (in 1-2weeks, call for appt upon discharge)    Specialty:  Internal Medicine   Contact information:   [address] [place] [address] [phone]       Follow up with Lora Paula, MD. (as directed, call for appt upon discharge)    Specialty:  Radiation Oncology   Contact information:   [place] [address] [phone]       Follow up with Eilleen Kempf., MD. (as directed)    Specialty:  Oncology   Contact information:   [place] [address] [phone]        Discharge Diagnoses:  Active Problems:   Malignant neoplasm of upper lobe, bronchus or lung   Brain metastases   Chronic systolic congestive heart failure   COPD with acute exacerbation   COPD exacerbation   Discharge Condition: improved/stable  Diet recommendation: regular  Filed Weights   06/18/13 1333 06/19/13 1827 06/20/13 0529  Weight: 56.972 kg (125 lb 9.6 oz) 55.702 kg (122 lb 12.8 oz) 55.067 kg (121 lb 6.4 oz)    History of present illness:  Luis Payne is a 73 y.o. male with past medical history significant for lung cancer with brain metastasis, COPD -home O2 dependent on 4 L, chronic systolic congestive heart failure, PE on chronic Lovenox who presents with above complaints. He states that he's had progressive worsening of shortness of breath for the past 3-4 days. He admits to a cough productive of small amounts of yellowish sputum. he denies fevers. He also denies leg swelling and no PND reported. He was seen in the ED a chest x-ray showed no acute infiltrates. Was noted that patient had a CT scan of his chest with contrast on  2/4 which showed increased metastatic tumor or burden throughout the left pleural space but with a decreased size of left pleural effusion, increased airspace disease also noted in left upper and lower lobes. BNP was 298 and WBC within normal limits at 7.0. He was treated with Solu-Medrol, Levaquin, nebs in the ED and is admitted for further evaluation and management.   Hospital Course:  COPD with acute exacerbation+/-postobstructive pneumonia  -As discussed above, chest x-ray 2/7 with no acute infiltrates but CT scan of 2/4 increased airspace disease noted in left upper and lower lobes.  - Upon admission patient was placed nebulized bronchodilators, steroids, antibiotics, Mucinex  -He was diuresed when necessary with IV Lasix -With above treatment regimen he improved clinically. On followup today exercise pulse ox on 4 L dropped to 87%(improved from 85% on 2/8) and so his O2 was increased to 4.5 L and his oxygen sats with ambulation stayed in the 90s. -He is clinically improved at this time and will be discharged on a prednisone taper as well as oral antibiotics is to followup with his PCP and outpatient MDs. Acute on chronic respiratory failure  -Secondary to above see management as discussed above  Malignant neoplasm of upper lobe, bronchus or lung/Brain metastases  -He is followed by Dr. Julien Nordmann and Dr. Asa Lente add to their Epic consult lists  -He was seen by Dr. Julien Nordmann today and recent MRI of brain done prior to admission was reviewed with him>> his to  follow up outpatient with Dr. Tammi Klippel as directed Chronic systolic congestive heart failure  -his BNP is only 298, she was diuresed with IV Lasix as needed with improvement in the hospital. -He states that he is on Lasix when necessary at home and his to continue this as directed, and follow up with his PCP     Procedures:  NONE  Consultations:  Oncology-Dr. Julien Nordmann  Discharge Exam: Filed Vitals:   06/20/13 0953  BP: 124/59   Pulse: 107  Temp:   Resp:    Exam:  General: alert & oriented x In NAD  Cardiovascular: RRR, nl S1 s2  Respiratory: Moderate air movement bilaterally, no crackles or wheezes Abdomen: soft +BS NT/ND, no masses palpable  Extremities: No edema, no cyanosis    Discharge Instructions  Discharge Orders   Future Appointments Provider Department Dept Phone   06/27/2013 9:15 AM [facility] [phone]   06/27/2013 9:45 AM Curt Bears, [facility] [phone]   06/27/2013 10:30 AM [facility] [phone]   07/04/2013 9:00 AM [facility] [phone]   09/16/2013 10:00 AM Burtis Junes, NP [facility] [phone]   Future Orders Complete By Expires   Diet general  As directed    Discharge instructions  As directed    Comments:     Continue home O2 at 4-4.5 L as directed on discharge.   Increase activity slowly  As directed        Medication List    STOP taking these medications       OXYGEN-HELIUM IN      TAKE these medications       carvedilol 3.125 MG tablet  Commonly known as:  COREG  Take 1 tablet (3.125 mg total) by mouth 2 (two) times daily.     cetirizine 10 MG tablet  Commonly known as:  ZYRTEC  Take 10 mg by mouth daily.     cholecalciferol 1000 UNITS tablet  Commonly known as:  VITAMIN D  Take 1,000 Units by mouth every morning.     digoxin 0.125 MG tablet  Commonly known as:  LANOXIN  Take 1 tablet (0.125 mg total) by mouth daily.     enoxaparin 60 MG/0.6ML injection  Commonly known as:  LOVENOX  Inject 0.6 mLs (60 mg total) into the skin daily.     feeding supplement (ENSURE COMPLETE) Liqd  Take 237 mLs by mouth 2 (two) times daily between meals.     folic acid 1 MG tablet  Commonly known as:  FOLVITE  TAKE 1 TABLET (1 MG TOTAL) BY MOUTH DAILY.      guaiFENesin 600 MG 12 hr tablet  Commonly known as:  MUCINEX  Take 600 mg by mouth daily.     levalbuterol 0.63 MG/3ML nebulizer solution  Commonly known as:  XOPENEX  Take 3 mLs (0.63 mg total) by nebulization every 4 (four) hours as needed for wheezing.     levofloxacin 500 MG tablet  Commonly known as:  LEVAQUIN  Take 1 tablet (500 mg total) by mouth daily.     multivitamin with minerals Tabs tablet  Take 1 tablet by mouth every morning.     oxyCODONE-acetaminophen 10-325 MG per tablet  Commonly known as:  PERCOCET  Take 1 tablet by mouth every 6 (six) hours as needed for pain.     predniSONE 20  MG tablet  Commonly known as:  DELTASONE  Take 2 tablets daily for 3 more day then, then1 tablet 3 days, then 1/2 tab for 3days and then stay on a maintenance dose as directed per Dr Julien Nordmann.     prochlorperazine 10 MG tablet  Commonly known as:  COMPAZINE  Take 1 tablet (10 mg total) by mouth every 6 (six) hours as needed for nausea or vomiting.     vitamin C 1000 MG tablet  Take 1,000 mg by mouth daily.       continue Lasix when necessary as previously  No Known Allergies     Follow-up Information   Follow up with Glo Herring., MD. (in 1-2weeks, call for appt upon discharge)    Specialty:  Internal Medicine   Contact information:   [address] [place] [address] [phone]       Follow up with Lora Paula, MD. (as directed, call for appt upon discharge)    Specialty:  Radiation Oncology   Contact information:   [place] [address] [phone]       Follow up with Eilleen Kempf., MD. (as directed)    Specialty:  Oncology   Contact information:   [place] [address] [phone]        The results of significant diagnostics from this hospitalization (including imaging, microbiology, ancillary and laboratory) are listed below for reference.    Significant Diagnostic  Studies: Dg Chest 2 View  06/18/2013   CLINICAL DATA:  Shortness of breath  EXAM: CHEST  2 VIEW  COMPARISON:  05/02/2013, 06/15/2013  FINDINGS: Stable consolidation is noted within the left lung when compare with the recent CT examination. Emphysematous changes are noted on the right. No acute infiltrate is seen on the right. The osseous structures are within normal limits.  IMPRESSION: No acute abnormality is identified. The overall appearance is stable from the recent CT examination.   Electronically Signed   By: Inez Catalina M.D.   On: 06/18/2013 11:18   Ct Chest W Contrast  06/15/2013   CLINICAL DATA:  Followup metastatic lung carcinoma. Restaging. Recist 1.1.  EXAM: CT CHEST, ABDOMEN, AND PELVIS WITH CONTRAST  TECHNIQUE: Multidetector CT imaging of the chest, abdomen and pelvis was performed following the standard protocol during bolus administration of intravenous contrast.  CONTRAST:  17mL OMNIPAQUE IOHEXOL 300 MG/ML  SOLN  COMPARISON:  04/18/2013  FINDINGS: RECIST 1.1  Target Lesions:  1. Masslike opacity in the anterior left lung apex measuring 3.1 cm on image 12 of series 2 2. Left anterior pleural metastatic disease measuring 2.9 cm on image 44 of series 2 3. Left abdominal paraaortic retroperitoneal lymph node measuring 1.3 cm on image 60 of series 2 Non-target Lesions:  1. 7 mm right middle lobe pulmonary nodule on image 40 of series 4 CT CHEST FINDINGS  Malignant left pleural effusion has decreased in size since previous study, however increased in nodular soft tissue density is seen throughout the left pleural space.  Increased consolidation is seen throughout the left lower lobe with central air bronchograms, and there is also increased airspace disease seen involving a significant portion of the left upper lobe. Previously noted masslike opacity in the anterior left lung apex currently measures 3.1 x 3.1 cm compared to 2.6 x 2.6 cm previously.  7 mm pulmonary nodule in the right middle lobe on  image 40 remains unchanged. No new or enlarging pulmonary nodules seen in the  right lung and there is no evidence of right-sided pleural effusion.  No evidence of hilar or mediastinal lymphadenopathy. No adenopathy noted elsewhere within the thorax. No suspicious bone lesions identified.  CT ABDOMEN AND PELVIS FINDINGS  No liver masses are identified. The gallbladder, pancreas, spleen, and adrenal glands are normal in appearance. Tiny nonobstructive renal calculi are again seen bilaterally without evidence hydronephrosis. No evidence of renal masses.  New mild retroperitoneal lymphadenopathy seen in the left paraaortic region, measuring 1.3 cm on image 60 no other sites of lymphadenopathy identified within the abdomen or pelvis. No evidence of inflammatory process or abnormal fluid collections.  Small bilateral inguinal hernias are seen containing small bowel. No evidence of bowel obstruction or ischemia. No suspicious bone lesions identified.  IMPRESSION: Decreased size of left pleural effusion, with increased in metastatic tumor throughout the left pleural space.  Increased size of masslike opacity in the anterior left lung apex. Increased airspace disease also noted in left upper and lower lobes.  New mild left abdominal retroperitoneal lymphadenopathy.  Stable 7 mm right middle lobe pulmonary nodule.  Stable small bilateral inguinal hernias containing small bowel. Stable nonobstructive nephrolithiasis.   Electronically Signed   By: Earle Gell M.D.   On: 06/15/2013 14:40   Mr Jeri Cos IR Contrast  06/16/2013   CLINICAL DATA:  Non-small-cell lung cancer. Followup metastatic disease.  EXAM: MRI HEAD WITHOUT AND WITH CONTRAST  TECHNIQUE: Multiplanar, multiecho pulse sequences of the brain and surrounding structures were obtained without and with intravenous contrast.  CONTRAST:  5mL MULTIHANCE GADOBENATE DIMEGLUMINE 529 MG/ML IV SOLN  COMPARISON:  MRI 03/22/2013, 11/30/2012  FINDINGS: Stereotactic radiosurgery  protocol at 3 Tesla.  New enhancing lesions are seen consistent with metastatic deposits.  2 mm enhancing lesion left thalamus is new  2 mm enhancing lesion in the right frontal cortex is new  2 x 3 mm enhancing lesion in the left parietal cortex is new.  3 x 5 mm cyst in the left mid cerebellum is new however does not show enhancement. This could be a chronic infarct versus nonenhancing metastatic disease. 1 mm cystic area in the right cerebellum is unchanged.  Left anterior cerebellar enhancing lesion has resolved and no longer enhances or shows significant edema. Small enhancing lesions previously identified in the right parietal lobe, left frontal lobe, and left parietal lobe are no longer visualized.  Generalized atrophy. Ventricle size is normal. No shift of the midline structures. No significant edema in the brain. Negative for acute infarct.  IMPRESSION: There are 3 new enhancing lesions consistent with metastatic disease, involving the right frontal cortex, left parietal cortex, and left thalamus.  There is a new cystic area in the left cerebellum which does not enhance and may represent a chronic infarct versus nonenhancing metastatic disease.  Other previously treated lesions no longer visualized.   Electronically Signed   By: Franchot Gallo M.D.   On: 06/16/2013 15:33   Ct Abdomen Pelvis W Contrast  06/15/2013   CLINICAL DATA:  Followup metastatic lung carcinoma. Restaging. Recist 1.1.  EXAM: CT CHEST, ABDOMEN, AND PELVIS WITH CONTRAST  TECHNIQUE: Multidetector CT imaging of the chest, abdomen and pelvis was performed following the standard protocol during bolus administration of intravenous contrast.  CONTRAST:  166mL OMNIPAQUE IOHEXOL 300 MG/ML  SOLN  COMPARISON:  04/18/2013  FINDINGS: RECIST 1.1  Target Lesions:  1. Masslike opacity in the anterior left lung apex measuring 3.1 cm on image 12 of series 2 2. Left anterior pleural metastatic  disease measuring 2.9 cm on image 44 of series 2 3. Left  abdominal paraaortic retroperitoneal lymph node measuring 1.3 cm on image 60 of series 2 Non-target Lesions:  1. 7 mm right middle lobe pulmonary nodule on image 40 of series 4 CT CHEST FINDINGS  Malignant left pleural effusion has decreased in size since previous study, however increased in nodular soft tissue density is seen throughout the left pleural space.  Increased consolidation is seen throughout the left lower lobe with central air bronchograms, and there is also increased airspace disease seen involving a significant portion of the left upper lobe. Previously noted masslike opacity in the anterior left lung apex currently measures 3.1 x 3.1 cm compared to 2.6 x 2.6 cm previously.  7 mm pulmonary nodule in the right middle lobe on image 40 remains unchanged. No new or enlarging pulmonary nodules seen in the right lung and there is no evidence of right-sided pleural effusion.  No evidence of hilar or mediastinal lymphadenopathy. No adenopathy noted elsewhere within the thorax. No suspicious bone lesions identified.  CT ABDOMEN AND PELVIS FINDINGS  No liver masses are identified. The gallbladder, pancreas, spleen, and adrenal glands are normal in appearance. Tiny nonobstructive renal calculi are again seen bilaterally without evidence hydronephrosis. No evidence of renal masses.  New mild retroperitoneal lymphadenopathy seen in the left paraaortic region, measuring 1.3 cm on image 60 no other sites of lymphadenopathy identified within the abdomen or pelvis. No evidence of inflammatory process or abnormal fluid collections.  Small bilateral inguinal hernias are seen containing small bowel. No evidence of bowel obstruction or ischemia. No suspicious bone lesions identified.  IMPRESSION: Decreased size of left pleural effusion, with increased in metastatic tumor throughout the left pleural space.  Increased size of masslike opacity in the anterior left lung apex. Increased airspace disease also noted in left  upper and lower lobes.  New mild left abdominal retroperitoneal lymphadenopathy.  Stable 7 mm right middle lobe pulmonary nodule.  Stable small bilateral inguinal hernias containing small bowel. Stable nonobstructive nephrolithiasis.   Electronically Signed   By: Earle Gell M.D.   On: 06/15/2013 14:40    Microbiology: No results found for this or any previous visit (from the past 240 hour(s)).   Labs: Basic Metabolic Panel:  Recent Labs Lab 06/18/13 1041 06/19/13 1239 06/20/13 0358  NA 137 138 138  K 4.4 4.5 4.3  CL 96 95* 94*  CO2 33* 32 34*  GLUCOSE 142* 129* 102*  BUN 19 26* 35*  CREATININE 1.00 1.01 1.18  CALCIUM 9.6 9.9 9.4   Liver Function Tests: No results found for this basename: AST, ALT, ALKPHOS, BILITOT, PROT, ALBUMIN,  in the last 168 hours No results found for this basename: LIPASE, AMYLASE,  in the last 168 hours No results found for this basename: AMMONIA,  in the last 168 hours CBC:  Recent Labs Lab 06/18/13 1041  WBC 7.0  HGB 12.2*  HCT 37.0*  MCV 100.0  PLT 222   Cardiac Enzymes: No results found for this basename: CKTOTAL, CKMB, CKMBINDEX, TROPONINI,  in the last 168 hours BNP: BNP (last 3 results)  Recent Labs  06/18/13 1041  PROBNP 298.9*   CBG: No results found for this basename: GLUCAP,  in the last 168 hours     Signed:  Gissell Barra C  [facility] 06/20/2013, 11:50 AM

## 2013-06-20 NOTE — Evaluation (Signed)
Occupational Therapy Evaluation Patient Details Name: Luis Payne MRN: [id number] DOB: March 27, 1941 Today's Date: 06/20/2013 Time: 4098-1191 OT Time Calculation (min): 16 min  OT Assessment / Plan / Recommendation History of present illness Pt admitted with COPD exacerbation, postobstructive PNA, CHF, h/o lung cancer with brain mets*.    Clinical Impression   Pt is functioning at baseline. No further OT needed    OT Assessment  Patient does not need any further OT services    Follow Up Recommendations  No OT follow up       Equipment Recommendations  None recommended by OT          Precautions / Restrictions Precautions Precaution Comments: monitor SaO2, pt denies falls at home Restrictions Weight Bearing Restrictions: No       ADL  ADL Comments: Pt is I with all ADL activity .  OT did educate pt on energy conservation and safety with oxygen cord. Pt able to verbalize and return demonstrate.        Visit Information  Last OT Received On: 06/20/13 Assistance Needed: +1 History of Present Illness: Pt admitted with COPD exacerbation, postobstructive PNA, CHF, h/o lung cancer with brain mets*.           Vision/Perception Vision - History Baseline Vision: Wears glasses all the time   Cognition  Cognition Arousal/Alertness: Awake/alert Behavior During Therapy: WFL for tasks assessed/performed Overall Cognitive Status: Within Functional Limits for tasks assessed    Extremity/Trunk Assessment Cervical / Trunk Assessment Cervical / Trunk Assessment: Kyphotic     Mobility   Pt is I with mobility          End of Session OT - End of Session Activity Tolerance: Patient tolerated treatment well Patient left: in chair;with family/visitor present Nurse Communication: Mobility status  GO     Betsy Pries 06/20/2013, 12:13 PM

## 2013-06-20 NOTE — Progress Notes (Signed)
Ambulated patient in hallway from room 1422 to 1440 and back on 4L nasal canula, O2sats dropped to 87%. Patient is on 3L nasal canula at rest and satting at 94%.

## 2013-06-23 ENCOUNTER — Telehealth: Payer: Self-pay | Admitting: *Deleted

## 2013-06-23 NOTE — Telephone Encounter (Signed)
Pt's wife called wanting to clarify pt's appts for Monday 06/27/13.  She asked if pt needed to come to lab appt on Monday.  Informed her Dr Vista Mink would like to have up to date lab info.  Verified lab and f/u appt with both Dr Vista Mink and Dr Tammi Klippel.  SLJ

## 2013-06-27 ENCOUNTER — Encounter: Payer: Self-pay | Admitting: Radiation Oncology

## 2013-06-27 ENCOUNTER — Ambulatory Visit
Admission: RE | Admit: 2013-06-27 | Discharge: 2013-06-27 | Disposition: A | Discharge status: Home / Self Care | Payer: Medicare Other | Source: Ambulatory Visit | Attending: Radiation Oncology | Admitting: Radiation Oncology

## 2013-06-27 ENCOUNTER — Encounter: Payer: Self-pay | Admitting: Oncology

## 2013-06-27 ENCOUNTER — Other Ambulatory Visit (HOSPITAL_BASED_OUTPATIENT_CLINIC_OR_DEPARTMENT_OTHER): Payer: Medicare Other

## 2013-06-27 ENCOUNTER — Encounter: Payer: Self-pay | Admitting: *Deleted

## 2013-06-27 ENCOUNTER — Ambulatory Visit: Payer: Self-pay | Admitting: Internal Medicine

## 2013-06-27 ENCOUNTER — Telehealth: Payer: Self-pay | Admitting: Internal Medicine

## 2013-06-27 ENCOUNTER — Ambulatory Visit (HOSPITAL_BASED_OUTPATIENT_CLINIC_OR_DEPARTMENT_OTHER): Payer: Medicare Other | Admitting: Internal Medicine

## 2013-06-27 ENCOUNTER — Other Ambulatory Visit: Payer: Self-pay | Admitting: Medical Oncology

## 2013-06-27 ENCOUNTER — Ambulatory Visit: Payer: Self-pay

## 2013-06-27 ENCOUNTER — Encounter: Payer: Self-pay | Admitting: Internal Medicine

## 2013-06-27 VITALS — BP 126/73 | HR 98 | Temp 97.4°F | Resp 21 | Ht 69.0 in | Wt 126.5 lb

## 2013-06-27 VITALS — BP 135/65 | HR 98 | Temp 97.4°F | Resp 22 | Wt 126.7 lb

## 2013-06-27 DIAGNOSIS — R5383 Other fatigue: Secondary | ICD-10-CM

## 2013-06-27 DIAGNOSIS — R0989 Other specified symptoms and signs involving the circulatory and respiratory systems: Secondary | ICD-10-CM

## 2013-06-27 DIAGNOSIS — C7931 Secondary malignant neoplasm of brain: Secondary | ICD-10-CM

## 2013-06-27 DIAGNOSIS — C7949 Secondary malignant neoplasm of other parts of nervous system: Secondary | ICD-10-CM

## 2013-06-27 DIAGNOSIS — Z510 Encounter for antineoplastic radiation therapy: Secondary | ICD-10-CM | POA: Insufficient documentation

## 2013-06-27 DIAGNOSIS — R5381 Other malaise: Secondary | ICD-10-CM

## 2013-06-27 DIAGNOSIS — C3410 Malignant neoplasm of upper lobe, unspecified bronchus or lung: Secondary | ICD-10-CM

## 2013-06-27 DIAGNOSIS — R0609 Other forms of dyspnea: Secondary | ICD-10-CM

## 2013-06-27 DIAGNOSIS — J449 Chronic obstructive pulmonary disease, unspecified: Secondary | ICD-10-CM

## 2013-06-27 DIAGNOSIS — C3490 Malignant neoplasm of unspecified part of unspecified bronchus or lung: Secondary | ICD-10-CM | POA: Insufficient documentation

## 2013-06-27 DIAGNOSIS — C3492 Malignant neoplasm of unspecified part of left bronchus or lung: Secondary | ICD-10-CM

## 2013-06-27 LAB — CBC WITH DIFFERENTIAL/PLATELET
BASO%: 0 % (ref 0.0–2.0)
BASOS ABS: 0 10*3/uL (ref 0.0–0.1)
EOS ABS: 0 10*3/uL (ref 0.0–0.5)
EOS%: 0.4 % (ref 0.0–7.0)
HEMATOCRIT: 36.3 % — AB (ref 38.4–49.9)
HEMOGLOBIN: 11.7 g/dL — AB (ref 13.0–17.1)
LYMPH%: 8.4 % — ABNORMAL LOW (ref 14.0–49.0)
MCH: 32.1 pg (ref 27.2–33.4)
MCHC: 32.2 g/dL (ref 32.0–36.0)
MCV: 99.5 fL — ABNORMAL HIGH (ref 79.3–98.0)
MONO#: 0.7 10*3/uL (ref 0.1–0.9)
MONO%: 10.5 % (ref 0.0–14.0)
NEUT%: 80.7 % — AB (ref 39.0–75.0)
NEUTROS ABS: 5.4 10*3/uL (ref 1.5–6.5)
Platelets: 201 10*3/uL (ref 140–400)
RBC: 3.65 10*6/uL — ABNORMAL LOW (ref 4.20–5.82)
RDW: 15.5 % — ABNORMAL HIGH (ref 11.0–14.6)
WBC: 6.7 10*3/uL (ref 4.0–10.3)
lymph#: 0.6 10*3/uL — ABNORMAL LOW (ref 0.9–3.3)

## 2013-06-27 LAB — COMPREHENSIVE METABOLIC PANEL (CC13)
ALT: 26 U/L (ref 0–55)
ANION GAP: 9 meq/L (ref 3–11)
AST: 24 U/L (ref 5–34)
Albumin: 3 g/dL — ABNORMAL LOW (ref 3.5–5.0)
Alkaline Phosphatase: 92 U/L (ref 40–150)
BUN: 30 mg/dL — ABNORMAL HIGH (ref 7.0–26.0)
CALCIUM: 10.3 mg/dL (ref 8.4–10.4)
CHLORIDE: 99 meq/L (ref 98–109)
CO2: 33 meq/L — AB (ref 22–29)
CREATININE: 1 mg/dL (ref 0.7–1.3)
Glucose: 128 mg/dl (ref 70–140)
Potassium: 4.2 mEq/L (ref 3.5–5.1)
Sodium: 140 mEq/L (ref 136–145)
Total Bilirubin: 0.4 mg/dL (ref 0.20–1.20)
Total Protein: 6.6 g/dL (ref 6.4–8.3)

## 2013-06-27 MED ORDER — OXYCODONE-ACETAMINOPHEN 10-325 MG PO TABS: 1.0000 | ORAL_TABLET | Freq: Four times a day (QID) | ORAL | Status: DC | PRN

## 2013-06-27 NOTE — Telephone Encounter (Signed)
Gave pt ppt for lab and Md for March 2015

## 2013-06-27 NOTE — Progress Notes (Signed)
06/27/2013 Met with patient and wife today in the clinic area. See additional note by Remer Macho regarding PRO administration for the [facility] [id number] research study. Discussed with patient today that he will continue his participation in the study until he is able to enroll in the nivolumab treatment trial. Blood samples were collected in January, and therefore are not due to be collected until March 2015. Discussed with patient that he is not eligible at present for enrollment in the [facility] [id number] nivolumab trial, due to recent increased steroid dose, as well as presence of new brain metastases requiring treatment. Patient is aware that the research staff will continue to monitor his progress and work closely with Dr. Julien Nordmann. If patient's condition changes so that he meets eligibility for the study, then he will re-enter the screening process at that time, including signing of a new informed consent document. Patient is in agreement with this plan. Cindy S. Brigitte Pulse BSN, RN, [place] 06/27/2013 3:12 PM

## 2013-06-27 NOTE — Progress Notes (Signed)
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  Stereotactic radiosurgery 08/26/12 Right Parietal 5 mm and Left Parietal 5 mm target treated to 20 Gy.  2.  Local control of primary tumor 09/09/2012-10/15/2012  tumor was treated to 70.2 Gy in 26 fractions of 2.7 Gy 3.  Stereotactic radiosurgery 12/16/2012 Left cerebellar 6 mm and Right frontal 2 mm targets treated to 20 Gy  Interval Since Last Radiation:  6  months  Narrative:  The patient returns today for routine follow-up.  The recent films were presented in our multidisciplinary conference with neuroradiology just prior to the clinic.  Patient missed appt last week due to hospitalization. Patient informed by Dr. Julien Nordmann of 3 new lesions seen on MRI. Patient scheduled for [facility] CT/simulation after follow up with [facility] [place]. Oxygen sat 89 after ambulating down hallway to nurse station. Increased oxygen from 4 to 5 then saturations increased to 99%. Taking prednisone taper at 5 mg once per day presently. Question if he should continues aspirin while taking lovenox. Denies difficulty swallowing. Reports shortness of breath while ambulating. Oxgen therapy 4 liters via nasal cannula. Denies cough. Reports fatigue. Denies nausea, vomiting, headache, dizziness, diplopia or ringing in the ears                              ALLERGIES:  has No Known Allergies.  Meds: Current Outpatient Prescriptions  Medication Sig Dispense Refill  . Ascorbic Acid (VITAMIN C) 1000 MG tablet Take 1,000 mg by mouth daily.      . carvedilol (COREG) 3.125 MG tablet Take 1 tablet (3.125 mg total) by mouth 2 (two) times  daily.  180 tablet  3  . cetirizine (ZYRTEC) 10 MG tablet Take 10 mg by mouth daily.      . cholecalciferol (VITAMIN D) 1000 UNITS tablet Take 1,000 Units by mouth every morning.      . digoxin (LANOXIN) 0.125 MG tablet Take 1 tablet (0.125 mg total) by mouth daily.  90 tablet  3  . enoxaparin (LOVENOX) 60 MG/0.6ML injection Inject 0.6 mLs (60 mg total) into the skin daily.  90 Syringe  1  . feeding supplement (ENSURE COMPLETE) LIQD Take 237 mLs by mouth 2 (two) times daily between meals.  30 Bottle  5  . folic acid (FOLVITE) 1 MG tablet TAKE 1 TABLET (1 MG TOTAL) BY MOUTH DAILY.  90 tablet  1  . guaiFENesin (MUCINEX) 600 MG 12 hr tablet Take 600 mg by mouth daily.      Marland Kitchen levalbuterol (XOPENEX) 0.63 MG/3ML nebulizer solution Take 3 mLs (0.63 mg total) by nebulization every 4 (four) hours as needed for wheezing.  3 mL  120  . Multiple Vitamin (MULTIVITAMIN WITH MINERALS) TABS Take 1 tablet by mouth every morning.      Marland Kitchen oxyCODONE-acetaminophen (PERCOCET) 10-325 MG per tablet Take 1 tablet by mouth every 6 (six) hours as needed for pain.  60 tablet  0  . predniSONE (DELTASONE) 20 MG tablet Take 2 tablets daily for 3 more day then, then1 tablet 3 days, then 1/2 tab for 3days and then stay  on a maintenance dose as directed per Dr Julien Nordmann.  12 tablet  0  . prochlorperazine (COMPAZINE) 10 MG tablet Take 1 tablet (10 mg total) by mouth every 6 (six) hours as needed for nausea or vomiting.  30 tablet  0   No current facility-administered medications for this encounter.    Physical Findings: The patient is in no acute distress. Patient is alert and oriented.  weight is 126 lb 11.2 oz (57.471 kg). His temperature is 97.4 F (36.3 C). His blood pressure is 135/65 and his pulse is 98. His respiration is 22 and oxygen saturation is 89%. .  No significant changes.  Lab Findings: Lab Results  Component Value Date   WBC 6.7 06/27/2013   HGB 11.7* 06/27/2013   HCT 36.3* 06/27/2013   MCV 99.5* 06/27/2013    PLT 201 06/27/2013    @LASTCHEM @  Radiographic Findings: Dg Chest 2 View  06/18/2013   CLINICAL DATA:  Shortness of breath  EXAM: CHEST  2 VIEW  COMPARISON:  05/02/2013, 06/15/2013  FINDINGS: Stable consolidation is noted within the left lung when compare with the recent CT examination. Emphysematous changes are noted on the right. No acute infiltrate is seen on the right. The osseous structures are within normal limits.  IMPRESSION: No acute abnormality is identified. The overall appearance is stable from the recent CT examination.   Electronically Signed   By: Inez Catalina M.D.   On: 06/18/2013 11:18   Ct Chest W Contrast  06/15/2013   CLINICAL DATA:  Followup metastatic lung carcinoma. Restaging. Recist 1.1.  EXAM: CT CHEST, ABDOMEN, AND PELVIS WITH CONTRAST  TECHNIQUE: Multidetector CT imaging of the chest, abdomen and pelvis was performed following the standard protocol during bolus administration of intravenous contrast.  CONTRAST:  133mL OMNIPAQUE IOHEXOL 300 MG/ML  SOLN  COMPARISON:  04/18/2013  FINDINGS: RECIST 1.1  Target Lesions:  1. Masslike opacity in the anterior left lung apex measuring 3.1 cm on image 12 of series 2 2. Left anterior pleural metastatic disease measuring 2.9 cm on image 44 of series 2 3. Left abdominal paraaortic retroperitoneal lymph node measuring 1.3 cm on image 60 of series 2 Non-target Lesions:  1. 7 mm right middle lobe pulmonary nodule on image 40 of series 4 CT CHEST FINDINGS  Malignant left pleural effusion has decreased in size since previous study, however increased in nodular soft tissue density is seen throughout the left pleural space.  Increased consolidation is seen throughout the left lower lobe with central air bronchograms, and there is also increased airspace disease seen involving a significant portion of the left upper lobe. Previously noted masslike opacity in the anterior left lung apex currently measures 3.1 x 3.1 cm compared to 2.6 x 2.6 cm previously.   7 mm pulmonary nodule in the right middle lobe on image 40 remains unchanged. No new or enlarging pulmonary nodules seen in the right lung and there is no evidence of right-sided pleural effusion.  No evidence of hilar or mediastinal lymphadenopathy. No adenopathy noted elsewhere within the thorax. No suspicious bone lesions identified.  CT ABDOMEN AND PELVIS FINDINGS  No liver masses are identified. The gallbladder, pancreas, spleen, and adrenal glands are normal in appearance. Tiny nonobstructive renal calculi are again seen bilaterally without evidence hydronephrosis. No evidence of renal masses.  New mild retroperitoneal lymphadenopathy seen in the left paraaortic region, measuring 1.3 cm on image 60 no other sites of lymphadenopathy identified within the abdomen or pelvis. No evidence of inflammatory process  or abnormal fluid collections.  Small bilateral inguinal hernias are seen containing small bowel. No evidence of bowel obstruction or ischemia. No suspicious bone lesions identified.  IMPRESSION: Decreased size of left pleural effusion, with increased in metastatic tumor throughout the left pleural space.  Increased size of masslike opacity in the anterior left lung apex. Increased airspace disease also noted in left upper and lower lobes.  New mild left abdominal retroperitoneal lymphadenopathy.  Stable 7 mm right middle lobe pulmonary nodule.  Stable small bilateral inguinal hernias containing small bowel. Stable nonobstructive nephrolithiasis.   Electronically Signed   By: Earle Gell M.D.   On: 06/15/2013 14:40   Mr Jeri Cos IR Contrast  06/16/2013   CLINICAL DATA:  Non-small-cell lung cancer. Followup metastatic disease.  EXAM: MRI HEAD WITHOUT AND WITH CONTRAST  TECHNIQUE: Multiplanar, multiecho pulse sequences of the brain and surrounding structures were obtained without and with intravenous contrast.  CONTRAST:  70mL MULTIHANCE GADOBENATE DIMEGLUMINE 529 MG/ML IV SOLN  COMPARISON:  MRI 03/22/2013,  11/30/2012  FINDINGS: Stereotactic radiosurgery protocol at 3 Tesla.  New enhancing lesions are seen consistent with metastatic deposits.  2 mm enhancing lesion left thalamus is new  2 mm enhancing lesion in the right frontal cortex is new  2 x 3 mm enhancing lesion in the left parietal cortex is new.  3 x 5 mm cyst in the left mid cerebellum is new however does not show enhancement. This could be a chronic infarct versus nonenhancing metastatic disease. 1 mm cystic area in the right cerebellum is unchanged.  Left anterior cerebellar enhancing lesion has resolved and no longer enhances or shows significant edema. Small enhancing lesions previously identified in the right parietal lobe, left frontal lobe, and left parietal lobe are no longer visualized.  Generalized atrophy. Ventricle size is normal. No shift of the midline structures. No significant edema in the brain. Negative for acute infarct.  IMPRESSION: There are 3 new enhancing lesions consistent with metastatic disease, involving the right frontal cortex, left parietal cortex, and left thalamus.  There is a new cystic area in the left cerebellum which does not enhance and may represent a chronic infarct versus nonenhancing metastatic disease.  Other previously treated lesions no longer visualized.   Electronically Signed   By: Franchot Gallo M.D.   On: 06/16/2013 15:33   Ct Abdomen Pelvis W Contrast  06/15/2013   CLINICAL DATA:  Followup metastatic lung carcinoma. Restaging. Recist 1.1.  EXAM: CT CHEST, ABDOMEN, AND PELVIS WITH CONTRAST  TECHNIQUE: Multidetector CT imaging of the chest, abdomen and pelvis was performed following the standard protocol during bolus administration of intravenous contrast.  CONTRAST:  123mL OMNIPAQUE IOHEXOL 300 MG/ML  SOLN  COMPARISON:  04/18/2013  FINDINGS: RECIST 1.1  Target Lesions:  1. Masslike opacity in the anterior left lung apex measuring 3.1 cm on image 12 of series 2 2. Left anterior pleural metastatic disease  measuring 2.9 cm on image 44 of series 2 3. Left abdominal paraaortic retroperitoneal lymph node measuring 1.3 cm on image 60 of series 2 Non-target Lesions:  1. 7 mm right middle lobe pulmonary nodule on image 40 of series 4 CT CHEST FINDINGS  Malignant left pleural effusion has decreased in size since previous study, however increased in nodular soft tissue density is seen throughout the left pleural space.  Increased consolidation is seen throughout the left lower lobe with central air bronchograms, and there is also increased airspace disease seen involving a significant portion of the left upper  lobe. Previously noted masslike opacity in the anterior left lung apex currently measures 3.1 x 3.1 cm compared to 2.6 x 2.6 cm previously.  7 mm pulmonary nodule in the right middle lobe on image 40 remains unchanged. No new or enlarging pulmonary nodules seen in the right lung and there is no evidence of right-sided pleural effusion.  No evidence of hilar or mediastinal lymphadenopathy. No adenopathy noted elsewhere within the thorax. No suspicious bone lesions identified.  CT ABDOMEN AND PELVIS FINDINGS  No liver masses are identified. The gallbladder, pancreas, spleen, and adrenal glands are normal in appearance. Tiny nonobstructive renal calculi are again seen bilaterally without evidence hydronephrosis. No evidence of renal masses.  New mild retroperitoneal lymphadenopathy seen in the left paraaortic region, measuring 1.3 cm on image 60 no other sites of lymphadenopathy identified within the abdomen or pelvis. No evidence of inflammatory process or abnormal fluid collections.  Small bilateral inguinal hernias are seen containing small bowel. No evidence of bowel obstruction or ischemia. No suspicious bone lesions identified.  IMPRESSION: Decreased size of left pleural effusion, with increased in metastatic tumor throughout the left pleural space.  Increased size of masslike opacity in the anterior left lung apex.  Increased airspace disease also noted in left upper and lower lobes.  New mild left abdominal retroperitoneal lymphadenopathy.  Stable 7 mm right middle lobe pulmonary nodule.  Stable small bilateral inguinal hernias containing small bowel. Stable nonobstructive nephrolithiasis.   Electronically Signed   By: Earle Gell M.D.   On: 06/15/2013 14:40    Impression:  The patient has 3 new brain mets amenable to [facility] salvage.    Plan:  Treat 3 new brain mets with SRS for salvage.  CT sim today.  _____________________________________  Sheral Apley. Tammi Klippel, M.D.

## 2013-06-27 NOTE — Progress Notes (Signed)
[place] [phone]   [phone]  OFFICE VISIT PROGRESS NOTE  Glo Herring., MD [address] [place] [address]  DIAGNOSIS: Metastatic non-small cell lung cancer, adenocarcinoma with negative EGFR mutation and negative ALK gene translocation diagnosed in April 2014   PRIOR THERAPY:  1. Status post stereotactic radiotherapy to 2 brain lesions under the care of Dr. Tammi Klippel.  2. Status post palliative radiotherapy to the left lung mass.  3. Systemic chemotherapy with carboplatin for AUC of 5 and Alimta 500 mg/M2 every 3 weeks, status post 6 cycles. 4. Tarceva 150 mg by mouth daily. Started 02/25/2013. Status post approximately two month of therapy discontinued today secondary to disease progression. 5. Systemic chemotherapy with gemcitabine 1000 mg/M2 on days 1 and 8 every 3 weeks. First dose on 04/26/2013  CURRENT THERAPY: None  CHEMOTHERAPY INTENT: Palliative  CURRENT # OF CHEMOTHERAPY CYCLES: 0  CURRENT ANTIEMETICS: Compazine CURRENT SMOKING STATUS: Former smoker, quit 09/28/2012  ORAL CHEMOTHERAPY AND CONSENT: Tarceva and the patient signed consent today.  CURRENT BISPHOSPHONATES USE: none  PAIN MANAGEMENT: Percocet  NARCOTICS INDUCED CONSTIPATION: none  LIVING WILL AND CODE STATUS: No code Blue.  INTERVAL HISTORY: Luis Payne 73 y.o. male returns to the clinic today for followup visit accompanied by his wife. The patient continues to have the baseline shortness of breath and he is currently on home oxygen. He also has mild fatigue. He was recently admitted to [facility] with COPD excessive patient. His dose of prednisone was increased but currently on a taper schedule. He is currently on 5 mg of prednisone by mouth daily. During his evaluation for the immunotherapy clinical trial repeat MRI of the brain was performed and it showed 3 new enhancing lesion consistent with metastatic disease involving  the right frontal cortex, left parietal cortex and left thalamus. The patient was seen by Dr. Lisbeth Renshaw and he is considered for stereotactic radiotherapy for these lesions. He denied having any significant nausea or vomiting. He has no fever or chills.   MEDICAL HISTORY: Past Medical History  Diagnosis Date  . Substance abuse     TOBACCO  . Lung mass   . 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 Brain metastases 08/18/12    mri-  . HOH (hard of hearing)     wears hearing aids  . COPD (chronic obstructive pulmonary disease)   . Systolic heart failure     EF of 30 to 35% per echo in June 2014  . Lung cancer   . Legionella pneumonia   . CHF (congestive heart failure)     ALLERGIES:  has No Known Allergies.  MEDICATIONS:  Current Outpatient Prescriptions  Medication Sig Dispense Refill  . Ascorbic Acid (VITAMIN C) 1000 MG tablet Take 1,000 mg by mouth daily.      Marland Kitchen aspirin 81 MG tablet Take 81 mg by mouth daily. 2 tablets daily per pt./      . carvedilol (COREG) 3.125 MG tablet Take 1 tablet (3.125 mg total) by mouth 2 (two) times daily.  180 tablet  3  . cetirizine (ZYRTEC) 10 MG tablet Take 10 mg by mouth daily.      . cholecalciferol (VITAMIN D) 1000 UNITS tablet Take 1,000 Units by mouth every morning.      . digoxin (LANOXIN) 0.125 MG tablet Take 1 tablet (0.125 mg total) by mouth daily.  90 tablet  3  . enoxaparin (LOVENOX) 60  MG/0.6ML injection Inject 0.6 mLs (60 mg total) into the skin daily.  90 Syringe  1  . feeding supplement (ENSURE COMPLETE) LIQD Take 237 mLs by mouth 2 (two) times daily between meals.  30 Bottle  5  . folic acid (FOLVITE) 1 MG tablet TAKE 1 TABLET (1 MG TOTAL) BY MOUTH DAILY.  90 tablet  1  . guaiFENesin (MUCINEX) 600 MG 12 hr tablet Take 600 mg by mouth daily.      Marland Kitchen levalbuterol (XOPENEX) 0.63 MG/3ML nebulizer solution Take 3 mLs (0.63 mg total) by nebulization every 4 (four) hours as needed for wheezing.  3 mL  120  . Multiple Vitamin (MULTIVITAMIN  WITH MINERALS) TABS Take 1 tablet by mouth every morning.      . OXYGEN-HELIUM IN Inhale 4 L into the lungs.      . predniSONE (DELTASONE) 20 MG tablet Take 2 tablets daily for 3 more day then, then1 tablet 3 days, then 1/2 tab for 3days and then stay on a maintenance dose as directed per Dr Julien Nordmann.  12 tablet  0  . oxyCODONE-acetaminophen (PERCOCET) 10-325 MG per tablet Take 1 tablet by mouth every 6 (six) hours as needed for pain.  60 tablet  0  . prochlorperazine (COMPAZINE) 10 MG tablet Take 1 tablet (10 mg total) by mouth every 6 (six) hours as needed for nausea or vomiting.  30 tablet  0   No current facility-administered medications for this visit.    SURGICAL HISTORY:  Past Surgical History  Procedure Laterality Date  . Wrist surgery    . Lung biopsy Left 08/17/12    LUL lung mass-adenocarcinoma  . Ganglion cyst excision    . Nose surgery      REVIEW OF SYSTEMS:  Constitutional: positive for fatigue Eyes: positive for irritation Ears, nose, mouth, throat, and face: negative Respiratory: positive for dyspnea on exertion and On supplemental oxygen, 2 L nasal cannula Cardiovascular: negative Gastrointestinal: negative Genitourinary:negative Integument/breast: positive for rash Hematologic/lymphatic: negative Musculoskeletal:negative Neurological: negative Behavioral/Psych: negative Endocrine: negative Allergic/Immunologic: negative   PHYSICAL EXAMINATION: General appearance: alert, cooperative, fatigued and no distress Head: Normocephalic, without obvious abnormality, atraumatic Neck: no adenopathy, no JVD, supple, symmetrical, trachea midline and thyroid not enlarged, symmetric, no tenderness/mass/nodules Lymph nodes: Cervical, supraclavicular, and axillary nodes normal. Resp: wheezes bilaterally Back: symmetric, no curvature. ROM normal. No CVA tenderness. Cardio: regular rate and rhythm, S1, S2 normal, no murmur, click, rub or gallop GI: soft, non-tender; bowel sounds  normal; no masses,  no organomegaly Extremities: extremities normal, atraumatic, no cyanosis or edema Neurologic: Alert and oriented X 3, normal strength and tone. Normal symmetric reflexes. Normal coordination and gait Skin: Mild to moderate acneform eruptions over the nose, chin and cheeks, no evidence of superinfection.   ECOG PERFORMANCE STATUS: 2 - Symptomatic, <50% confined to bed  Blood pressure 126/73, pulse 98, temperature 97.4 F (36.3 C), temperature source Oral, resp. rate 21, height 5' 9"  (1.753 m), weight 126 lb 8 oz (57.38 kg), SpO2 91.00%.  LABORATORY DATA: Lab Results  Component Value Date   WBC 6.7 06/27/2013   HGB 11.7* 06/27/2013   HCT 36.3* 06/27/2013   MCV 99.5* 06/27/2013   PLT 201 06/27/2013      Chemistry      Component Value Date/Time   NA 140 06/27/2013 0852   NA 138 06/20/2013 0358   K 4.2 06/27/2013 0852   K 4.3 06/20/2013 0358   CL 94* 06/20/2013 0358   CL 94* 11/01/2012 1105  CO2 33* 06/27/2013 0852   CO2 34* 06/20/2013 0358   BUN 30.0* 06/27/2013 0852   BUN 35* 06/20/2013 0358   CREATININE 1.0 06/27/2013 0852   CREATININE 1.18 06/20/2013 0358      Component Value Date/Time   CALCIUM 10.3 06/27/2013 0852   CALCIUM 9.4 06/20/2013 0358   ALKPHOS 92 06/27/2013 0852   ALKPHOS 128* 10/18/2012 0500   AST 24 06/27/2013 0852   AST 40* 10/18/2012 0500   ALT 26 06/27/2013 0852   ALT 34 10/18/2012 0500   BILITOT 0.40 06/27/2013 0852   BILITOT 0.9 10/18/2012 0500       RADIOGRAPHIC STUDIES: Dg Chest 2 View  06/18/2013   CLINICAL DATA:  Shortness of breath  EXAM: CHEST  2 VIEW  COMPARISON:  05/02/2013, 06/15/2013  FINDINGS: Stable consolidation is noted within the left lung when compare with the recent CT examination. Emphysematous changes are noted on the right. No acute infiltrate is seen on the right. The osseous structures are within normal limits.  IMPRESSION: No acute abnormality is identified. The overall appearance is stable from the recent CT examination.   Electronically  Signed   By: Inez Catalina M.D.   On: 06/18/2013 11:18   Ct Chest W Contrast  06/15/2013   CLINICAL DATA:  Followup metastatic lung carcinoma. Restaging. Recist 1.1.  EXAM: CT CHEST, ABDOMEN, AND PELVIS WITH CONTRAST  TECHNIQUE: Multidetector CT imaging of the chest, abdomen and pelvis was performed following the standard protocol during bolus administration of intravenous contrast.  CONTRAST:  169m OMNIPAQUE IOHEXOL 300 MG/ML  SOLN  COMPARISON:  04/18/2013  FINDINGS: RECIST 1.1  Target Lesions:  1. Masslike opacity in the anterior left lung apex measuring 3.1 cm on image 12 of series 2 2. Left anterior pleural metastatic disease measuring 2.9 cm on image 44 of series 2 3. Left abdominal paraaortic retroperitoneal lymph node measuring 1.3 cm on image 60 of series 2 Non-target Lesions:  1. 7 mm right middle lobe pulmonary nodule on image 40 of series 4 CT CHEST FINDINGS  Malignant left pleural effusion has decreased in size since previous study, however increased in nodular soft tissue density is seen throughout the left pleural space.  Increased consolidation is seen throughout the left lower lobe with central air bronchograms, and there is also increased airspace disease seen involving a significant portion of the left upper lobe. Previously noted masslike opacity in the anterior left lung apex currently measures 3.1 x 3.1 cm compared to 2.6 x 2.6 cm previously.  7 mm pulmonary nodule in the right middle lobe on image 40 remains unchanged. No new or enlarging pulmonary nodules seen in the right lung and there is no evidence of right-sided pleural effusion.  No evidence of hilar or mediastinal lymphadenopathy. No adenopathy noted elsewhere within the thorax. No suspicious bone lesions identified.  CT ABDOMEN AND PELVIS FINDINGS  No liver masses are identified. The gallbladder, pancreas, spleen, and adrenal glands are normal in appearance. Tiny nonobstructive renal calculi are again seen bilaterally without  evidence hydronephrosis. No evidence of renal masses.  New mild retroperitoneal lymphadenopathy seen in the left paraaortic region, measuring 1.3 cm on image 60 no other sites of lymphadenopathy identified within the abdomen or pelvis. No evidence of inflammatory process or abnormal fluid collections.  Small bilateral inguinal hernias are seen containing small bowel. No evidence of bowel obstruction or ischemia. No suspicious bone lesions identified.  IMPRESSION: Decreased size of left pleural effusion, with increased in metastatic tumor throughout the  left pleural space.  Increased size of masslike opacity in the anterior left lung apex. Increased airspace disease also noted in left upper and lower lobes.  New mild left abdominal retroperitoneal lymphadenopathy.  Stable 7 mm right middle lobe pulmonary nodule.  Stable small bilateral inguinal hernias containing small bowel. Stable nonobstructive nephrolithiasis.   Electronically Signed   By: Earle Gell M.D.   On: 06/15/2013 14:40   Mr Jeri Cos TG Contrast  06/16/2013   CLINICAL DATA:  Non-small-cell lung cancer. Followup metastatic disease.  EXAM: MRI HEAD WITHOUT AND WITH CONTRAST  TECHNIQUE: Multiplanar, multiecho pulse sequences of the brain and surrounding structures were obtained without and with intravenous contrast.  CONTRAST:  62m MULTIHANCE GADOBENATE DIMEGLUMINE 529 MG/ML IV SOLN  COMPARISON:  MRI 03/22/2013, 11/30/2012  FINDINGS: Stereotactic radiosurgery protocol at 3 Tesla.  New enhancing lesions are seen consistent with metastatic deposits.  2 mm enhancing lesion left thalamus is new  2 mm enhancing lesion in the right frontal cortex is new  2 x 3 mm enhancing lesion in the left parietal cortex is new.  3 x 5 mm cyst in the left mid cerebellum is new however does not show enhancement. This could be a chronic infarct versus nonenhancing metastatic disease. 1 mm cystic area in the right cerebellum is unchanged.  Left anterior cerebellar enhancing  lesion has resolved and no longer enhances or shows significant edema. Small enhancing lesions previously identified in the right parietal lobe, left frontal lobe, and left parietal lobe are no longer visualized.  Generalized atrophy. Ventricle size is normal. No shift of the midline structures. No significant edema in the brain. Negative for acute infarct.  IMPRESSION: There are 3 new enhancing lesions consistent with metastatic disease, involving the right frontal cortex, left parietal cortex, and left thalamus.  There is a new cystic area in the left cerebellum which does not enhance and may represent a chronic infarct versus nonenhancing metastatic disease.  Other previously treated lesions no longer visualized.   Electronically Signed   By: CFranchot GalloM.D.   On: 06/16/2013 15:33   Ct Abdomen Pelvis W Contrast  06/15/2013   CLINICAL DATA:  Followup metastatic lung carcinoma. Restaging. Recist 1.1.  EXAM: CT CHEST, ABDOMEN, AND PELVIS WITH CONTRAST  TECHNIQUE: Multidetector CT imaging of the chest, abdomen and pelvis was performed following the standard protocol during bolus administration of intravenous contrast.  CONTRAST:  1088mOMNIPAQUE IOHEXOL 300 MG/ML  SOLN  COMPARISON:  04/18/2013  FINDINGS: RECIST 1.1  Target Lesions:  1. Masslike opacity in the anterior left lung apex measuring 3.1 cm on image 12 of series 2 2. Left anterior pleural metastatic disease measuring 2.9 cm on image 44 of series 2 3. Left abdominal paraaortic retroperitoneal lymph node measuring 1.3 cm on image 60 of series 2 Non-target Lesions:  1. 7 mm right middle lobe pulmonary nodule on image 40 of series 4 CT CHEST FINDINGS  Malignant left pleural effusion has decreased in size since previous study, however increased in nodular soft tissue density is seen throughout the left pleural space.  Increased consolidation is seen throughout the left lower lobe with central air bronchograms, and there is also increased airspace disease  seen involving a significant portion of the left upper lobe. Previously noted masslike opacity in the anterior left lung apex currently measures 3.1 x 3.1 cm compared to 2.6 x 2.6 cm previously.  7 mm pulmonary nodule in the right middle lobe on image 40 remains unchanged. No new  or enlarging pulmonary nodules seen in the right lung and there is no evidence of right-sided pleural effusion.  No evidence of hilar or mediastinal lymphadenopathy. No adenopathy noted elsewhere within the thorax. No suspicious bone lesions identified.  CT ABDOMEN AND PELVIS FINDINGS  No liver masses are identified. The gallbladder, pancreas, spleen, and adrenal glands are normal in appearance. Tiny nonobstructive renal calculi are again seen bilaterally without evidence hydronephrosis. No evidence of renal masses.  New mild retroperitoneal lymphadenopathy seen in the left paraaortic region, measuring 1.3 cm on image 60 no other sites of lymphadenopathy identified within the abdomen or pelvis. No evidence of inflammatory process or abnormal fluid collections.  Small bilateral inguinal hernias are seen containing small bowel. No evidence of bowel obstruction or ischemia. No suspicious bone lesions identified.  IMPRESSION: Decreased size of left pleural effusion, with increased in metastatic tumor throughout the left pleural space.  Increased size of masslike opacity in the anterior left lung apex. Increased airspace disease also noted in left upper and lower lobes.  New mild left abdominal retroperitoneal lymphadenopathy.  Stable 7 mm right middle lobe pulmonary nodule.  Stable small bilateral inguinal hernias containing small bowel. Stable nonobstructive nephrolithiasis.   Electronically Signed   By: Earle Gell M.D.   On: 06/15/2013 14:40   ASSESSMENT AND PLAN: This is a very pleasant 73 years old white male with history of metastatic non-small cell lung cancer, adenocarcinoma status post systemic chemotherapy with carboplatin and  Alimta followed by 2 months treatment with single agent Tarceva and one dose of gemcitabine discontinued secondary to her recent hospitalization with severe dyspnea.  The patient is currently undergoing evaluation for involvement in the BMS [id number] clinical trial with Nivolumab, but with the new brain metastasis, his treatment will be delayed until completion of the stereotactic radiotherapy and stabilization of his disease. For COPD, the patient will continue on prednisone 5 mg by mouth daily in addition to his current inhaler and nebulizer treatment. I would see the patient back for follow up visit in 2 weeks for reevaluation before starting his treatment with immunotherapy. He was advised to call immediately if he has any concerning symptoms in the interval. The patient voices understanding of current disease status and treatment options and is in agreement with the current care plan.  All questions were answered. The patient knows to call the clinic with any problems, questions or concerns. We can certainly see the patient much sooner if necessary.  Eilleen Kempf., MD 06/27/2013

## 2013-06-27 NOTE — Progress Notes (Signed)
  Radiation Oncology         [phone] ________________________________  Name: Luis Payne MRN: [id number]  Date: 06/27/2013  DOB: Jun 13, 1940  SIMULATION AND TREATMENT PLANNING NOTE  DIAGNOSIS:  73 yo man with 3 new subcentimeter brain mets from non-small cell lung cancer  NARRATIVE:  The patient was brought to the [facility].  Identity was confirmed.  All relevant records and images related to the planned course of therapy were reviewed.  The patient freely provided informed written consent to proceed with treatment after reviewing the details related to the planned course of therapy. The consent form was witnessed and verified by the simulation staff. Intravenous access was established for contrast administration. Then, the patient was set-up in a stable reproducible supine position for radiation therapy.  A relocatable thermoplastic stereotactic head frame was fabricated for precise immobilization.  CT images were obtained.  Surface markings were placed.  The CT images were loaded into the planning software and fused with the patient's targeting MRI scan.  Then the target and avoidance structures were contoured.  Treatment planning then occurred.  The radiation prescription was entered and confirmed.  I have requested 3D planning  I have requested a DVH of the following structures: Brain stem, brain, left eye, right I, lenses, optic chiasm, target volumes, uninvolved brain, and normal tissue.    PLAN:  The patient will receive 20 Gy in 1 fraction to 3 new lesions.  ________________________________  Sheral Apley Tammi Klippel, M.D.

## 2013-06-27 NOTE — Progress Notes (Signed)
06/27/13 - BMS [id number] - Questionnaires - Patient into cancer center this morning to have lab, RT, MO apt's.  The patient completed his questionnaires (PRO's) before any study procedures were performed. He completed the EQ-5D-3L PRO first and then the LCSS booklet.  I thanked the patient for his participation in this clinical trial.

## 2013-06-27 NOTE — Progress Notes (Signed)
Patient missed appt last week due to hospitalization. Patient informed by Dr. Julien Nordmann of 3 new lesions seen on MRI. Patient scheduled for [facility] CT/simulation after follow up with [facility] [place]. Oxygen sat 89 after ambulating down hallway to nurse station. Increased oxygen from 4 to 5 then saturations increased to 99%. Taking prednisone taper at 5 mg once per day presently. Question if he should continues aspirin while taking lovenox. Denies difficulty swallowing. Reports shortness of breath while ambulating. Oxgen therapy 4 liters via nasal cannula. Denies cough. Reports fatigue. Denies nausea, vomiting, headache, dizziness, diplopia or ringing in the ears.

## 2013-06-27 NOTE — Telephone Encounter (Signed)
rx given to pt

## 2013-07-04 ENCOUNTER — Other Ambulatory Visit: Payer: Self-pay

## 2013-07-04 ENCOUNTER — Ambulatory Visit
Admission: RE | Admit: 2013-07-04 | Discharge: 2013-07-04 | Disposition: A | Discharge status: Home / Self Care | Payer: Medicare Other | Source: Ambulatory Visit | Attending: Radiation Oncology | Admitting: Radiation Oncology

## 2013-07-04 ENCOUNTER — Encounter: Payer: Self-pay | Admitting: Radiation Oncology

## 2013-07-04 VITALS — BP 121/59 | HR 91 | Temp 97.4°F | Resp 16

## 2013-07-04 DIAGNOSIS — C7949 Secondary malignant neoplasm of other parts of nervous system: Principal | ICD-10-CM

## 2013-07-04 DIAGNOSIS — C7931 Secondary malignant neoplasm of brain: Secondary | ICD-10-CM

## 2013-07-04 NOTE — Progress Notes (Signed)
  Radiation Oncology         [phone] ________________________________  Stereotactic Treatment Procedure Note  Name: Luis Payne MRN: [id number]  Date: 07/04/2013  DOB: [birth year]  SPECIAL TREATMENT PROCEDURE  3D TREATMENT PLANNING AND DOSIMETRY:  The patient's radiation plan was reviewed and approved by neurosurgery and radiation oncology prior to treatment.  It showed 3-dimensional radiation distributions overlaid onto the planning CT/MRI image set.  The [facility] for the target structures as well as the organs at risk were reviewed. The documentation of the 3D plan and dosimetry are filed in the radiation oncology EMR.  NARRATIVE:  Luis Payne was brought to the [employer] stereotactic radiation treatment machine and placed supine on the CT couch. The head frame was applied, and the patient was set up for stereotactic radiosurgery.  Neurosurgery was present for the set-up and delivery  SIMULATION VERIFICATION:  In the couch zero-angle position, the patient underwent Exactrac imaging using the Brainlab system with orthogonal KV images.  These were carefully aligned and repeated to confirm treatment position for each of the isocenters.  The Exactrac snap film verification was repeated at each couch angle.  SPECIAL TREATMENT PROCEDURE: Luis Payne received stereotactic radiosurgery to the following targets: Left parietal 3 mm target was treated using 3 Circular Arcs with the 7.5 mm cones to a prescription dose of 20 Gy.  ExacTrac registration was performed for each couch angle.  The 77.5% isodose line was prescribed. Right frontal 2 mm target was treated using 3 Circular Arcs with the 7.5 mm cones to a prescription dose of 20 Gy.  ExacTrac registration was performed for each couch angle.  The 80% isodose line was prescribed. Left thalamus 2 mm target was treated using 3 Circular Arcs with the 6 mm cones to a prescription dose of 20 Gy.  ExacTrac registration was performed for each couch angle.   The 74.1% isodose line was prescribed.  STEREOTACTIC TREATMENT MANAGEMENT:  Following delivery, the patient was transported to nursing in stable condition and monitored for possible acute effects.  Vital signs were recorded BP 121/59  Pulse 91  Temp(Src) 97.4 F (36.3 C) (Oral)  Resp 16  SpO2 98%. The patient tolerated treatment without significant acute effects, and was discharged to home in stable condition.    PLAN: Follow-up in one month.  ________________________________  Sheral Apley. Tammi Klippel, M.D.

## 2013-07-04 NOTE — Op Note (Signed)
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 Ledon Snare (radiation oncology) prior to treatment.  It showed 3-dimensional radiation distributions overlaid onto the planning CT/MRI image set.  The [facility] for the target structures as well as the organs at risk were reviewed. The documentation of the 3D plan and dosimetry are filed in the radiation oncology EMR.  NARRATIVE:  Luis Payne was brought to the TrueBeam stereotactic radiation treatment machine and placed supine on the CT couch. The head frame was applied, and the patient was set up for stereotactic radiosurgery.  I was present for the set-up and delivery.  SIMULATION VERIFICATION:  In the couch zero-angle position, the patient underwent Exactrac imaging using the Brainlab system with orthogonal KV images.  These were carefully aligned and repeated to confirm treatment position for each of the isocenters.  The Exactrac snap film verification was repeated at each couch angle.  SPECIAL TREATMENT PROCEDURE: Luis Payne received stereotactic radiosurgery to the following targets: Left parietal target was treated using 3 Circular Arcs to a prescription dose of 20 Gy.  ExacTrac registration was performed for each couch angle.  The 77.5% isodose line was prescribed. Right frontal target was treated using 3 Circular Arcs to a prescription dose of 20 Gy.  ExacTrac registration was performed for each couch angle.  The 80% isodose line was prescribed. Left thalamic target was treated using 3 Circular Arcs to a prescription dose of 20 Gy.  ExacTrac registration was performed for each couch  angle.  The 74.1% isodose line was prescribed.  STEREOTACTIC TREATMENT MANAGEMENT:  Following delivery, the patient was transported to nursing in stable condition and monitored for possible acute effects.  Vital signs were recorded BP 121/59  Pulse 91  Temp(Src) 97.4 F (36.3 C) (Oral)  Resp 16  SpO2 98%. The patient tolerated treatment without significant acute effects, and was discharged to home in stable condition.    PLAN: Follow-up in one month.

## 2013-07-04 NOTE — Progress Notes (Signed)
One hour nurse monitoring following SRS treatment is complete. Patient alert and oriented to person, place, and time. No distress noted. Denies nausea, vomiting, headache, dizziness, diplopia or ringing in the ears. Oxygen therapy via nasal cannula noted. Patient discharged home with wife and ambulated out.

## 2013-07-08 ENCOUNTER — Telehealth: Payer: Self-pay | Admitting: Nurse Practitioner

## 2013-07-08 NOTE — Telephone Encounter (Signed)
New message   Patient calling C/O ankle swelling. Lisinopril 2.05 mg & furosemide 20 mg one a day. Should medication be adjusted.

## 2013-07-08 NOTE — Telephone Encounter (Signed)
According to his medicine list, he is NOT on lisinopril - we elected at his last visit with me to not restart given that his pumping function had returned to normal.  Is his weight up?  Is he restricting salt?  May take an extra dose of his Lasix for the next 2 days to see if this helps.

## 2013-07-08 NOTE — Telephone Encounter (Signed)
S/w pt  Weight has been fluctuating between 119 and 121, pt stated does not eat salt, pt also stated not taking lisinopril and is agreeable to plan will take an extra dose of lasix for two days and will call me back on  Monday to see if this helps. Will send this to [facility] for an [place]

## 2013-07-10 NOTE — Progress Notes (Signed)
  Radiation Oncology         [phone] ________________________________  Name: SHLOME BALDREE MRN: [id number]  Date: 07/04/2013  DOB: 1941/04/06  End of Treatment Note  Diagnosis:   73 yo man with 3 new subcentimeter brain mets from non-small cell lung cancer  Indication for treatment:  Palliation       Radiation treatment dates:   07/04/2013  Site/dose:   [name] received stereotactic radiosurgery to the following targets:   Left parietal 3 mm target was treated using 3 Circular Arcs with the 7.5 mm cones to a prescription dose of 20 Gy. ExacTrac registration was performed for each couch angle. The 77.5% isodose line was prescribed.   Right frontal 2 mm target was treated using 3 Circular Arcs with the 7.5 mm cones to a prescription dose of 20 Gy. ExacTrac registration was performed for each couch angle. The 80% isodose line was prescribed.   Left thalamus 2 mm target was treated using 3 Circular Arcs with the 6 mm cones to a prescription dose of 20 Gy. ExacTrac registration was performed for each couch angle. The 74.1% isodose line was prescribed.  Beams/energy:   6 megavolt photons were delivered in the flattening filter free beam mode.  Narrative: The patient tolerated radiation treatment relatively well.   No acute complications occurred.  Plan: The patient has completed radiation treatment. The patient will return to radiation oncology clinic for routine followup in one month. I advised him to call or return sooner if he has any questions or concerns related to his recovery or treatment. ________________________________  Sheral Apley. Tammi Klippel, M.D.

## 2013-07-11 ENCOUNTER — Other Ambulatory Visit: Payer: Self-pay | Admitting: *Deleted

## 2013-07-11 DIAGNOSIS — C7931 Secondary malignant neoplasm of brain: Secondary | ICD-10-CM

## 2013-07-11 DIAGNOSIS — C7949 Secondary malignant neoplasm of other parts of nervous system: Principal | ICD-10-CM

## 2013-07-11 NOTE — Telephone Encounter (Signed)
Follow up    [place] to know that doubling up on the lasix worked---ankles are not swollen any more

## 2013-07-12 ENCOUNTER — Other Ambulatory Visit (HOSPITAL_BASED_OUTPATIENT_CLINIC_OR_DEPARTMENT_OTHER): Payer: Medicare Other

## 2013-07-12 ENCOUNTER — Encounter: Payer: Self-pay | Admitting: Internal Medicine

## 2013-07-12 ENCOUNTER — Other Ambulatory Visit: Payer: Self-pay

## 2013-07-12 ENCOUNTER — Ambulatory Visit (HOSPITAL_BASED_OUTPATIENT_CLINIC_OR_DEPARTMENT_OTHER): Payer: Medicare Other | Admitting: Internal Medicine

## 2013-07-12 ENCOUNTER — Encounter: Payer: Medicare Other | Admitting: *Deleted

## 2013-07-12 ENCOUNTER — Telehealth: Payer: Self-pay | Admitting: Internal Medicine

## 2013-07-12 VITALS — BP 128/62 | HR 96 | Temp 97.5°F | Resp 17 | Ht 69.0 in | Wt 127.0 lb

## 2013-07-12 DIAGNOSIS — C3410 Malignant neoplasm of upper lobe, unspecified bronchus or lung: Secondary | ICD-10-CM

## 2013-07-12 DIAGNOSIS — C7931 Secondary malignant neoplasm of brain: Secondary | ICD-10-CM

## 2013-07-12 DIAGNOSIS — J449 Chronic obstructive pulmonary disease, unspecified: Secondary | ICD-10-CM

## 2013-07-12 DIAGNOSIS — C7949 Secondary malignant neoplasm of other parts of nervous system: Secondary | ICD-10-CM

## 2013-07-12 DIAGNOSIS — J4489 Other specified chronic obstructive pulmonary disease: Secondary | ICD-10-CM

## 2013-07-12 DIAGNOSIS — E871 Hypo-osmolality and hyponatremia: Secondary | ICD-10-CM

## 2013-07-12 LAB — CBC WITH DIFFERENTIAL/PLATELET
BASO%: 0.6 % (ref 0.0–2.0)
Basophils Absolute: 0 10*3/uL (ref 0.0–0.1)
EOS%: 0.3 % (ref 0.0–7.0)
Eosinophils Absolute: 0 10*3/uL (ref 0.0–0.5)
HCT: 34.4 % — ABNORMAL LOW (ref 38.4–49.9)
HGB: 11.6 g/dL — ABNORMAL LOW (ref 13.0–17.1)
LYMPH%: 7.3 % — ABNORMAL LOW (ref 14.0–49.0)
MCH: 33.5 pg — AB (ref 27.2–33.4)
MCHC: 33.8 g/dL (ref 32.0–36.0)
MCV: 99 fL — ABNORMAL HIGH (ref 79.3–98.0)
MONO#: 0.6 10*3/uL (ref 0.1–0.9)
MONO%: 9.9 % (ref 0.0–14.0)
NEUT#: 5.3 10*3/uL (ref 1.5–6.5)
NEUT%: 81.9 % — ABNORMAL HIGH (ref 39.0–75.0)
Platelets: 263 10*3/uL (ref 140–400)
RBC: 3.48 10*6/uL — ABNORMAL LOW (ref 4.20–5.82)
RDW: 15.8 % — AB (ref 11.0–14.6)
WBC: 6.5 10*3/uL (ref 4.0–10.3)
lymph#: 0.5 10*3/uL — ABNORMAL LOW (ref 0.9–3.3)

## 2013-07-12 LAB — COMPREHENSIVE METABOLIC PANEL (CC13)
ALBUMIN: 2.9 g/dL — AB (ref 3.5–5.0)
ALT: 19 U/L (ref 0–55)
ANION GAP: 9 meq/L (ref 3–11)
AST: 25 U/L (ref 5–34)
Alkaline Phosphatase: 121 U/L (ref 40–150)
BUN: 26.9 mg/dL — ABNORMAL HIGH (ref 7.0–26.0)
CALCIUM: 10.3 mg/dL (ref 8.4–10.4)
CO2: 35 mEq/L — ABNORMAL HIGH (ref 22–29)
Chloride: 97 mEq/L — ABNORMAL LOW (ref 98–109)
Creatinine: 1.1 mg/dL (ref 0.7–1.3)
Glucose: 122 mg/dl (ref 70–140)
POTASSIUM: 3.9 meq/L (ref 3.5–5.1)
SODIUM: 141 meq/L (ref 136–145)
Total Bilirubin: 0.37 mg/dL (ref 0.20–1.20)
Total Protein: 7 g/dL (ref 6.4–8.3)

## 2013-07-12 LAB — T3, FREE: T3, Free: 3.1 pg/mL (ref 2.3–4.2)

## 2013-07-12 LAB — HEPATITIS C ANTIBODY: HCV Ab: NEGATIVE

## 2013-07-12 LAB — LACTATE DEHYDROGENASE (CC13): LDH: 155 U/L (ref 125–245)

## 2013-07-12 LAB — PHOSPHORUS: PHOSPHORUS: 3.2 mg/dL (ref 2.3–4.6)

## 2013-07-12 LAB — TSH CHCC: TSH: 3.384 m(IU)/L (ref 0.320–4.118)

## 2013-07-12 LAB — MAGNESIUM (CC13): Magnesium: 1.9 mg/dl (ref 1.5–2.5)

## 2013-07-12 LAB — HEPATITIS B SURFACE ANTIGEN: Hepatitis B Surface Ag: NEGATIVE

## 2013-07-12 LAB — T4, FREE: FREE T4: 1.31 ng/dL (ref 0.80–1.80)

## 2013-07-12 NOTE — Progress Notes (Signed)
[place] [phone]   [phone]  OFFICE VISIT PROGRESS NOTE  Glo Herring., MD [address] [place] [address]  DIAGNOSIS: Metastatic non-small cell lung cancer, adenocarcinoma with negative EGFR mutation and negative ALK gene translocation diagnosed in April 2014   PRIOR THERAPY:  1. Status post stereotactic radiotherapy to 2 brain lesions under the care of Dr. Tammi Klippel.  2. Status post palliative radiotherapy to the left lung mass.  3. Systemic chemotherapy with carboplatin for AUC of 5 and Alimta 500 mg/M2 every 3 weeks, status post 6 cycles. 4. Tarceva 150 mg by mouth daily. Started 02/25/2013. Status post approximately two month of therapy discontinued today secondary to disease progression. 5. Systemic chemotherapy with gemcitabine 1000 mg/M2 on days 1 and 8 every 3 weeks. First dose on 04/26/2013. 6. Stereotactic radiotherapy to 3 metastatic brain lesions completed on 07/04/2013.  CURRENT THERAPY: Screening for immunotherapy clinical trial  CHEMOTHERAPY INTENT: Palliative  CURRENT # OF CHEMOTHERAPY CYCLES: 0  CURRENT ANTIEMETICS: Compazine CURRENT SMOKING STATUS: Former smoker, quit 09/28/2012  ORAL CHEMOTHERAPY AND CONSENT: Tarceva and the patient signed consent.  CURRENT BISPHOSPHONATES USE: none  PAIN MANAGEMENT: Percocet  NARCOTICS INDUCED CONSTIPATION: none  LIVING WILL AND CODE STATUS: No code Blue.  INTERVAL HISTORY: Luis Payne 73 y.o. male returns to the clinic today for followup visit accompanied by his wife. The patient continues to have the baseline shortness of breath and he is currently on home oxygen. He also has mild fatigue. He tolerated the previous stereotactic radiotherapy to his brain lesions fairly well. He denied having any significant nausea or vomiting, no fever or chills. He is currently on prednisone 5 mg by mouth daily. He is here today for evaluation and screening for the  immunotherapy clinical trial with Nivolumab.  MEDICAL HISTORY: Past Medical History  Diagnosis Date  . Substance abuse     TOBACCO  . Lung mass   . 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 Brain metastases 08/18/12    mri-  . HOH (hard of hearing)     wears hearing aids  . COPD (chronic obstructive pulmonary disease)   . Systolic heart failure     EF of 30 to 35% per echo in June 2014  . Lung cancer   . Legionella pneumonia   . CHF (congestive heart failure)     ALLERGIES:  has No Known Allergies.  MEDICATIONS:  Current Outpatient Prescriptions  Medication Sig Dispense Refill  . Ascorbic Acid (VITAMIN C) 1000 MG tablet Take 1,000 mg by mouth daily.      . carvedilol (COREG) 3.125 MG tablet Take 1 tablet (3.125 mg total) by mouth 2 (two) times daily.  180 tablet  3  . cetirizine (ZYRTEC) 10 MG tablet Take 10 mg by mouth daily.      . cholecalciferol (VITAMIN D) 1000 UNITS tablet Take 1,000 Units by mouth every morning.      . digoxin (LANOXIN) 0.125 MG tablet Take 1 tablet (0.125 mg total) by mouth daily.  90 tablet  3  . enoxaparin (LOVENOX) 60 MG/0.6ML injection Inject 0.6 mLs (60 mg total) into the skin daily.  90 Syringe  1  . feeding supplement (ENSURE COMPLETE) LIQD Take 237 mLs by mouth 2 (two) times daily between meals.  30 Bottle  5  . folic acid (FOLVITE) 1 MG tablet TAKE 1 TABLET (1 MG TOTAL) BY MOUTH DAILY.  90 tablet  1  .  guaiFENesin (MUCINEX) 600 MG 12 hr tablet Take 600 mg by mouth daily.      Marland Kitchen levalbuterol (XOPENEX) 0.63 MG/3ML nebulizer solution Take 3 mLs (0.63 mg total) by nebulization every 4 (four) hours as needed for wheezing.  3 mL  120  . Multiple Vitamin (MULTIVITAMIN WITH MINERALS) TABS Take 1 tablet by mouth every morning.      Marland Kitchen oxyCODONE-acetaminophen (PERCOCET) 10-325 MG per tablet Take 1 tablet by mouth every 6 (six) hours as needed for pain.  60 tablet  0  . OXYGEN-HELIUM IN Inhale 4 L into the lungs.      . predniSONE (DELTASONE) 20 MG  tablet Take 2 tablets daily for 3 more day then, then1 tablet 3 days, then 1/2 tab for 3days and then stay on a maintenance dose as directed per Dr Julien Nordmann.  12 tablet  0  . aspirin 81 MG tablet Take 81 mg by mouth daily. 2 tablets daily per pt./      . prochlorperazine (COMPAZINE) 10 MG tablet Take 1 tablet (10 mg total) by mouth every 6 (six) hours as needed for nausea or vomiting.  30 tablet  0   No current facility-administered medications for this visit.    SURGICAL HISTORY:  Past Surgical History  Procedure Laterality Date  . Wrist surgery    . Lung biopsy Left 08/17/12    LUL lung mass-adenocarcinoma  . Ganglion cyst excision    . Nose surgery      REVIEW OF SYSTEMS:  Constitutional: positive for fatigue Eyes: positive for irritation Ears, nose, mouth, throat, and face: negative Respiratory: positive for dyspnea on exertion and On supplemental oxygen, 2 L nasal cannula Cardiovascular: negative Gastrointestinal: negative Genitourinary:negative Integument/breast: positive for rash Hematologic/lymphatic: negative Musculoskeletal:negative Neurological: negative Behavioral/Psych: negative Endocrine: negative Allergic/Immunologic: negative   PHYSICAL EXAMINATION: General appearance: alert, cooperative, fatigued and no distress Head: Normocephalic, without obvious abnormality, atraumatic Neck: no adenopathy, no JVD, supple, symmetrical, trachea midline and thyroid not enlarged, symmetric, no tenderness/mass/nodules Lymph nodes: Cervical, supraclavicular, and axillary nodes normal. Resp: wheezes bilaterally Back: symmetric, no curvature. ROM normal. No CVA tenderness. Cardio: regular rate and rhythm, S1, S2 normal, no murmur, click, rub or gallop GI: soft, non-tender; bowel sounds normal; no masses,  no organomegaly Extremities: extremities normal, atraumatic, no cyanosis or edema Neurologic: Alert and oriented X 3, normal strength and tone. Normal symmetric reflexes. Normal  coordination and gait Skin: Mild to moderate acneform eruptions over the nose, chin and cheeks, no evidence of superinfection.   ECOG PERFORMANCE STATUS: 2 - Symptomatic, <50% confined to bed  Blood pressure 128/62, pulse 96, temperature 97.5 F (36.4 C), temperature source Oral, resp. rate 17, height 5' 9"  (1.753 m), weight 127 lb (57.607 kg), SpO2 93.00%.  LABORATORY DATA: Lab Results  Component Value Date   WBC 6.5 07/12/2013   HGB 11.6* 07/12/2013   HCT 34.4* 07/12/2013   MCV 99.0* 07/12/2013   PLT 263 07/12/2013      Chemistry      Component Value Date/Time   NA 140 06/27/2013 0852   NA 138 06/20/2013 0358   K 4.2 06/27/2013 0852   K 4.3 06/20/2013 0358   CL 94* 06/20/2013 0358   CL 94* 11/01/2012 1105   CO2 33* 06/27/2013 0852   CO2 34* 06/20/2013 0358   BUN 30.0* 06/27/2013 0852   BUN 35* 06/20/2013 0358   CREATININE 1.0 06/27/2013 0852   CREATININE 1.18 06/20/2013 0358      Component Value Date/Time  CALCIUM 10.3 06/27/2013 0852   CALCIUM 9.4 06/20/2013 0358   ALKPHOS 92 06/27/2013 0852   ALKPHOS 128* 10/18/2012 0500   AST 24 06/27/2013 0852   AST 40* 10/18/2012 0500   ALT 26 06/27/2013 0852   ALT 34 10/18/2012 0500   BILITOT 0.40 06/27/2013 0852   BILITOT 0.9 10/18/2012 0500       RADIOGRAPHIC STUDIES:  ASSESSMENT AND PLAN: This is a very pleasant 73 years old white male with history of metastatic non-small cell lung cancer, adenocarcinoma status post systemic chemotherapy with carboplatin and Alimta followed by 2 months treatment with single agent Tarceva and one dose of gemcitabine discontinued secondary to her recent hospitalization with severe dyspnea.  The patient is currently undergoing evaluation for involvement in the BMS 209153 clinical trial with Nivolumab, He completed the stereotactic radiotherapy to these 3 new brain lesions on 07/04/2013. He needs repeat CT scan of the chest, abdomen and pelvis for restaging of his disease before starting the clinical trial.  For COPD, the  patient will continue on prednisone 5 mg by mouth daily in addition to his current inhaler and nebulizer treatment. I would see the patient back for follow up visit in one week for reevaluation before starting his treatment with immunotherapy. He was advised to call immediately if he has any concerning symptoms in the interval. The patient voices understanding of current disease status and treatment options and is in agreement with the current care plan.  All questions were answered. The patient knows to call the clinic with any problems, questions or concerns. We can certainly see the patient much sooner if necessary.  Disclaimer: This note was dictated with voice recognition software. Similar sounding words can inadvertently be transcribed and may not be corrected upon review.  Eilleen Kempf., MD 07/12/2013

## 2013-07-12 NOTE — Telephone Encounter (Signed)
gv and printed appt sched and avs forpt for March....sed added tx......gv pt barium

## 2013-07-12 NOTE — Telephone Encounter (Signed)
S/w pt aware of Lori's recommendations and will go back to one lasix daily pt agreeable to plan

## 2013-07-12 NOTE — Telephone Encounter (Signed)
Noted. Would encourage use of support stockings and salt restriction. Stay on usual dose of Lasix. I suspect some of this swelling is due to his current treatment.

## 2013-07-12 NOTE — Progress Notes (Signed)
07/12/2013 Patient arrived at clinic today for routine lab and MD follow-up visit. The patient completed the BMS CA209-118 Patient Reported Outcomes (PROs) independently, including the EQ-5D-3L followed by the Lung Cancer Symptom Scale (LCSS), prior to the MD appointment. Following completion, patient identifiers were added to the EQ-5D-3L questionnaire. See Consent Form encounter note for further details related to re-screening for the BMS CA209-153 research study. Cindy S. Brigitte Pulse BSN, RN, [place] 07/12/2013 2:01 PM

## 2013-07-14 ENCOUNTER — Encounter (HOSPITAL_COMMUNITY): Payer: Self-pay

## 2013-07-14 ENCOUNTER — Ambulatory Visit (HOSPITAL_COMMUNITY)
Admission: RE | Admit: 2013-07-14 | Discharge: 2013-07-14 | Disposition: A | Discharge status: Home / Self Care | Payer: Medicare Other | Source: Ambulatory Visit | Attending: Internal Medicine | Admitting: Internal Medicine

## 2013-07-14 DIAGNOSIS — K7689 Other specified diseases of liver: Secondary | ICD-10-CM | POA: Insufficient documentation

## 2013-07-14 DIAGNOSIS — C3410 Malignant neoplasm of upper lobe, unspecified bronchus or lung: Secondary | ICD-10-CM

## 2013-07-14 DIAGNOSIS — I708 Atherosclerosis of other arteries: Secondary | ICD-10-CM | POA: Insufficient documentation

## 2013-07-14 DIAGNOSIS — J910 Malignant pleural effusion: Secondary | ICD-10-CM | POA: Insufficient documentation

## 2013-07-14 DIAGNOSIS — C3490 Malignant neoplasm of unspecified part of unspecified bronchus or lung: Secondary | ICD-10-CM | POA: Diagnosis present

## 2013-07-14 DIAGNOSIS — N200 Calculus of kidney: Secondary | ICD-10-CM | POA: Insufficient documentation

## 2013-07-14 DIAGNOSIS — I700 Atherosclerosis of aorta: Secondary | ICD-10-CM | POA: Insufficient documentation

## 2013-07-14 DIAGNOSIS — K429 Umbilical hernia without obstruction or gangrene: Secondary | ICD-10-CM | POA: Diagnosis not present

## 2013-07-14 DIAGNOSIS — R911 Solitary pulmonary nodule: Secondary | ICD-10-CM | POA: Insufficient documentation

## 2013-07-14 DIAGNOSIS — R222 Localized swelling, mass and lump, trunk: Secondary | ICD-10-CM | POA: Insufficient documentation

## 2013-07-14 DIAGNOSIS — J438 Other emphysema: Secondary | ICD-10-CM | POA: Diagnosis not present

## 2013-07-14 DIAGNOSIS — K4090 Unilateral inguinal hernia, without obstruction or gangrene, not specified as recurrent: Secondary | ICD-10-CM | POA: Diagnosis not present

## 2013-07-14 MED ORDER — IOHEXOL 300 MG/ML  SOLN
100.0000 mL | Freq: Once | INTRAMUSCULAR | Status: AC | PRN
  Administered 2013-07-14: 100 mL via INTRAVENOUS

## 2013-07-17 ENCOUNTER — Encounter: Payer: Self-pay | Admitting: Radiation Oncology

## 2013-07-19 ENCOUNTER — Other Ambulatory Visit: Payer: Self-pay | Admitting: *Deleted

## 2013-07-19 ENCOUNTER — Ambulatory Visit (HOSPITAL_BASED_OUTPATIENT_CLINIC_OR_DEPARTMENT_OTHER): Payer: Medicare Other

## 2013-07-19 ENCOUNTER — Encounter: Payer: Medicare Other | Admitting: *Deleted

## 2013-07-19 ENCOUNTER — Encounter: Payer: Self-pay | Admitting: Physician Assistant

## 2013-07-19 ENCOUNTER — Telehealth: Payer: Self-pay | Admitting: Internal Medicine

## 2013-07-19 ENCOUNTER — Ambulatory Visit (HOSPITAL_BASED_OUTPATIENT_CLINIC_OR_DEPARTMENT_OTHER): Payer: Medicare Other | Admitting: Physician Assistant

## 2013-07-19 ENCOUNTER — Other Ambulatory Visit (HOSPITAL_BASED_OUTPATIENT_CLINIC_OR_DEPARTMENT_OTHER): Payer: Medicare Other

## 2013-07-19 VITALS — BP 146/70 | HR 106 | Temp 97.4°F | Resp 22 | Ht 65.0 in | Wt 126.8 lb

## 2013-07-19 DIAGNOSIS — C3410 Malignant neoplasm of upper lobe, unspecified bronchus or lung: Secondary | ICD-10-CM

## 2013-07-19 DIAGNOSIS — C7949 Secondary malignant neoplasm of other parts of nervous system: Secondary | ICD-10-CM

## 2013-07-19 DIAGNOSIS — C7931 Secondary malignant neoplasm of brain: Secondary | ICD-10-CM

## 2013-07-19 DIAGNOSIS — Z5112 Encounter for antineoplastic immunotherapy: Secondary | ICD-10-CM

## 2013-07-19 DIAGNOSIS — C3492 Malignant neoplasm of unspecified part of left bronchus or lung: Secondary | ICD-10-CM

## 2013-07-19 DIAGNOSIS — J449 Chronic obstructive pulmonary disease, unspecified: Secondary | ICD-10-CM

## 2013-07-19 LAB — CBC WITH DIFFERENTIAL/PLATELET
BASO%: 0.3 % (ref 0.0–2.0)
Basophils Absolute: 0 10*3/uL (ref 0.0–0.1)
EOS%: 0.4 % (ref 0.0–7.0)
Eosinophils Absolute: 0 10*3/uL (ref 0.0–0.5)
HCT: 33.1 % — ABNORMAL LOW (ref 38.4–49.9)
HGB: 11.2 g/dL — ABNORMAL LOW (ref 13.0–17.1)
LYMPH%: 7.5 % — AB (ref 14.0–49.0)
MCH: 33.5 pg — AB (ref 27.2–33.4)
MCHC: 33.8 g/dL (ref 32.0–36.0)
MCV: 99.2 fL — ABNORMAL HIGH (ref 79.3–98.0)
MONO#: 0.7 10*3/uL (ref 0.1–0.9)
MONO%: 11.1 % (ref 0.0–14.0)
NEUT#: 5.4 10*3/uL (ref 1.5–6.5)
NEUT%: 80.7 % — ABNORMAL HIGH (ref 39.0–75.0)
PLATELETS: 275 10*3/uL (ref 140–400)
RBC: 3.33 10*6/uL — AB (ref 4.20–5.82)
RDW: 15.4 % — AB (ref 11.0–14.6)
WBC: 6.7 10*3/uL (ref 4.0–10.3)
lymph#: 0.5 10*3/uL — ABNORMAL LOW (ref 0.9–3.3)

## 2013-07-19 LAB — COMPREHENSIVE METABOLIC PANEL (CC13)
ALK PHOS: 116 U/L (ref 40–150)
ALT: 15 U/L (ref 0–55)
ANION GAP: 8 meq/L (ref 3–11)
AST: 21 U/L (ref 5–34)
Albumin: 2.7 g/dL — ABNORMAL LOW (ref 3.5–5.0)
BILIRUBIN TOTAL: 0.23 mg/dL (ref 0.20–1.20)
BUN: 21.8 mg/dL (ref 7.0–26.0)
CO2: 32 mEq/L — ABNORMAL HIGH (ref 22–29)
CREATININE: 1 mg/dL (ref 0.7–1.3)
Calcium: 10.1 mg/dL (ref 8.4–10.4)
Chloride: 100 mEq/L (ref 98–109)
Glucose: 141 mg/dl — ABNORMAL HIGH (ref 70–140)
Potassium: 4.4 mEq/L (ref 3.5–5.1)
Sodium: 140 mEq/L (ref 136–145)
Total Protein: 6.7 g/dL (ref 6.4–8.3)

## 2013-07-19 LAB — RESEARCH LABS

## 2013-07-19 MED ORDER — SODIUM CHLORIDE 0.9 % IV SOLN
Freq: Once | INTRAVENOUS | Status: AC
  Administered 2013-07-19: 11:00:00 via INTRAVENOUS

## 2013-07-19 MED ORDER — ONDANSETRON 16 MG/50ML IVPB (CHCC)
INTRAVENOUS | Status: AC
  Filled 2013-07-19: qty 16

## 2013-07-19 MED ORDER — OXYCODONE-ACETAMINOPHEN 10-325 MG PO TABS: 1.0000 | ORAL_TABLET | Freq: Four times a day (QID) | ORAL | Status: DC | PRN

## 2013-07-19 MED ORDER — SODIUM CHLORIDE 0.9 % IV SOLN
3.0000 mg/kg | Freq: Once | INTRAVENOUS | Status: AC
  Administered 2013-07-19: 173 mg via INTRAVENOUS
  Filled 2013-07-19: qty 17.3

## 2013-07-19 MED ORDER — DEXAMETHASONE SODIUM PHOSPHATE 20 MG/5ML IJ SOLN
INTRAMUSCULAR | Status: AC
  Filled 2013-07-19: qty 5

## 2013-07-19 NOTE — Progress Notes (Signed)
07/19/2013 Patient in to clinic today for evaluation and initiation of protocol treatment on the BMS CA209-153 research study. See also Consent Form encounter note dated today. Met with patient after arrival in lobby. Patient completed the Patient-reported Outcomes questionnaires independently, including the EQ-5D-3L followed by the Lung Cancer Symptom Scale (LCSS), prior to the lab and MD appointments. Research blood samples were collected via venipuncture in the lab. Based on history and physical exam by Dr. Julien Nordmann today, patient condition is acceptable for initiating treatment. O2 saturation was obtained at rest at 10:10am, on supplemental oxygen at 4L per minute. Patient was then registered to the study. Patient was given a completed Subject Alert Card and its contents were reviewed with the patient. Patient is aware that he should call the main [facility] number [phone]) for any of the symptoms listed, regardless of severity, in order to provide prompt assessment and treatment, as indicated, to prevent worsening. Patient reports no changes to his medications over the past 14 days. Consent to receive nivolumab was obtained by Dr. Julien Nordmann. Patient is aware that his participation in the [facility] CA209-118 study is terminated at this time, due to enrollment in the nivolumab treatment trial. Patient voiced understanding of the reason, and is aware that from this point forward, research blood samples and PROs will be obtained only for the BMS CA209-153 research study. Sign for infusion given to Jaclyn Prime. Cindy S. Brigitte Pulse BSN, RN, [place] 07/19/2013 12:57 PM

## 2013-07-19 NOTE — Patient Instructions (Signed)
[place] Discharge Instructions for Patients Receiving Chemotherapy  Today you received the following chemotherapy agent Nivolumab.  To help prevent nausea and vomiting after your treatment, we encourage you to take your nausea medication.   If you develop nausea and vomiting that is not controlled by your nausea medication, call the clinic.   BELOW ARE SYMPTOMS THAT SHOULD BE REPORTED IMMEDIATELY:  *FEVER GREATER THAN 100.5 F  *CHILLS WITH OR WITHOUT FEVER  NAUSEA AND VOMITING THAT IS NOT CONTROLLED WITH YOUR NAUSEA MEDICATION  *UNUSUAL SHORTNESS OF BREATH  *UNUSUAL BRUISING OR BLEEDING  TENDERNESS IN MOUTH AND THROAT WITH OR WITHOUT PRESENCE OF ULCERS  *URINARY PROBLEMS  *BOWEL PROBLEMS  UNUSUAL RASH Items with * indicate a potential emergency and should be followed up as soon as possible.  Feel free to call the clinic you have any questions or concerns. The clinic phone number is [phone].

## 2013-07-19 NOTE — Telephone Encounter (Signed)
per 4th 3/10 pof ignore last pof and go by 1st pof. all appts left as they were. s/w cindy in research and pt needs appt on 3/24 adn 4/7. cindy aware MM out on 3/24.

## 2013-07-19 NOTE — Telephone Encounter (Signed)
scheduled appts for both 3/10 pofs. also added 4/7 f/u and gave wife new schedule while in inf

## 2013-07-19 NOTE — Progress Notes (Signed)
[place] [phone]   [phone]  OFFICE VISIT PROGRESS NOTE  Glo Herring., MD [address] [place] [address]  DIAGNOSIS: Metastatic non-small cell lung cancer, adenocarcinoma with negative EGFR mutation and negative ALK gene translocation diagnosed in April 2014   PRIOR THERAPY:  1. Status post stereotactic radiotherapy to 2 brain lesions under the care of Dr. Tammi Klippel.  2. Status post palliative radiotherapy to the left lung mass.  3. Systemic chemotherapy with carboplatin for AUC of 5 and Alimta 500 mg/M2 every 3 weeks, status post 6 cycles. 4. Tarceva 150 mg by mouth daily. Started 02/25/2013. Status post approximately two month of therapy discontinued today secondary to disease progression. 5. Systemic chemotherapy with gemcitabine 1000 mg/M2 on days 1 and 8 every 3 weeks. First dose on 04/26/2013. 6. Stereotactic radiotherapy to 3 metastatic brain lesions completed on 07/04/2013.  CURRENT THERAPY: BMS immunotherapy clinical trial with Nivolumab. First cycle expected today  (07/19/2013)  CHEMOTHERAPY INTENT: Palliative  CURRENT # OF CHEMOTHERAPY CYCLES: 0  CURRENT ANTIEMETICS: Compazine CURRENT SMOKING STATUS: Former smoker, quit 09/28/2012  ORAL CHEMOTHERAPY AND CONSENT: Tarceva and the patient signed consent.  CURRENT BISPHOSPHONATES USE: none  PAIN MANAGEMENT: Percocet  NARCOTICS INDUCED CONSTIPATION: none  LIVING WILL AND CODE STATUS: No code Blue.  INTERVAL HISTORY: Luis Payne 73 y.o. male returns to the clinic today for followup visit accompanied by his wife. The patient continues to have the baseline shortness of breath and he is currently on home oxygen. He reports that she is able to walk only about 5 minutes before he has to stop and rest secondary to the shortness of breath. He also has mild fatigue. He complains of left sided back pain, denies trauma. The pain is from the shoulder area to the  waist area. He averages about 3 of his pain tablets daily interspersed with doses of Tylenol. His current number of tablets (#60) does not last long at his current consumption for pain relief. He requests an increase in the number of tablets per prescription. He tolerated the previous stereotactic radiotherapy to his brain lesions completed 07/04/2013 fairly well. He denies any issues with mentation, denied blurred vision or double vision as well as denied headaches. He's had no issues with ataxia. He is at his baseline neurologic state. He denied having any significant nausea or vomiting, no fever or chills. He is currently on prednisone 5 mg by mouth daily. He is here today to proceed with the immunotherapy clinical trial with Nivolumab.  MEDICAL HISTORY: Past Medical History  Diagnosis Date  . Substance abuse     TOBACCO  . Lung mass   . 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 Brain metastases 08/18/12    mri-  . HOH (hard of hearing)     wears hearing aids  . COPD (chronic obstructive pulmonary disease)   . Systolic heart failure     EF of 30 to 35% per echo in June 2014  . Lung cancer   . Legionella pneumonia   . CHF (congestive heart failure)     ALLERGIES:  has No Known Allergies.  MEDICATIONS:  Current Outpatient Prescriptions  Medication Sig Dispense Refill  . Ascorbic Acid (VITAMIN C) 1000 MG tablet Take 1,000 mg by mouth daily.      Marland Kitchen aspirin 81 MG tablet Take 81 mg by mouth daily. 2 tablets daily per pt./      . carvedilol (  COREG) 3.125 MG tablet Take 1 tablet (3.125 mg total) by mouth 2 (two) times daily.  180 tablet  3  . cetirizine (ZYRTEC) 10 MG tablet Take 10 mg by mouth daily.      . cholecalciferol (VITAMIN D) 1000 UNITS tablet Take 1,000 Units by mouth every morning.      . digoxin (LANOXIN) 0.125 MG tablet Take 1 tablet (0.125 mg total) by mouth daily.  90 tablet  3  . enoxaparin (LOVENOX) 60 MG/0.6ML injection Inject 0.6 mLs (60 mg total) into the skin  daily.  90 Syringe  1  . feeding supplement (ENSURE COMPLETE) LIQD Take 237 mLs by mouth 2 (two) times daily between meals.  30 Bottle  5  . folic acid (FOLVITE) 1 MG tablet TAKE 1 TABLET (1 MG TOTAL) BY MOUTH DAILY.  90 tablet  1  . guaiFENesin (MUCINEX) 600 MG 12 hr tablet Take 600 mg by mouth daily.      Marland Kitchen levalbuterol (XOPENEX) 0.63 MG/3ML nebulizer solution Take 3 mLs (0.63 mg total) by nebulization every 4 (four) hours as needed for wheezing.  3 mL  120  . Multiple Vitamin (MULTIVITAMIN WITH MINERALS) TABS Take 1 tablet by mouth every morning.      . OXYGEN-HELIUM IN Inhale 4 L into the lungs.      . predniSONE (DELTASONE) 20 MG tablet Take 2 tablets daily for 3 more day then, then1 tablet 3 days, then 1/2 tab for 3days and then stay on a maintenance dose as directed per Dr Julien Nordmann.  12 tablet  0  . oxyCODONE-acetaminophen (PERCOCET) 10-325 MG per tablet Take 1 tablet by mouth every 6 (six) hours as needed for pain.  90 tablet  0  . prochlorperazine (COMPAZINE) 10 MG tablet Take 1 tablet (10 mg total) by mouth every 6 (six) hours as needed for nausea or vomiting.  30 tablet  0   No current facility-administered medications for this visit.   Facility-Administered Medications Ordered in Other Visits  Medication Dose Route Frequency Provider Last Rate Last Dose  . nivolumab (BMS [id number]) 173 mg in sodium chloride 0.9 % 100 mL chemo infusion  3 mg/kg (Treatment Plan Actual) Intravenous Once Curt Bears, MD 235 mL/hr at 07/19/13 1157 173 mg at 07/19/13 1157    SURGICAL HISTORY:  Past Surgical History  Procedure Laterality Date  . Wrist surgery    . Lung biopsy Left 08/17/12    LUL lung mass-adenocarcinoma  . Ganglion cyst excision    . Nose surgery      REVIEW OF SYSTEMS:  Constitutional: positive for fatigue Eyes: positive for irritation Ears, nose, mouth, throat, and face: negative Respiratory: positive for dyspnea on exertion and On supplemental oxygen, 2 L nasal  cannula Cardiovascular: negative Gastrointestinal: negative Genitourinary:negative Integument/breast: positive for rash Hematologic/lymphatic: negative Musculoskeletal:negative Neurological: negative Behavioral/Psych: negative Endocrine: negative Allergic/Immunologic: negative   PHYSICAL EXAMINATION: General appearance: alert, cooperative, fatigued and no distress Head: Normocephalic, without obvious abnormality, atraumatic Neck: no adenopathy, no JVD, supple, symmetrical, trachea midline and thyroid not enlarged, symmetric, no tenderness/mass/nodules Lymph nodes: Cervical, supraclavicular, and axillary nodes normal. Resp: diminished breath sounds generalized, no wheezing, rales or rhonchi Back: symmetric, no curvature. ROM normal. No CVA tenderness. Cardio: regular rate and rhythm, S1, S2 normal, no murmur, click, rub or gallop GI: soft, non-tender; bowel sounds normal; no masses,  no organomegaly Extremities: edema 1+ - 2+ pitting edema bilateral lower extremities, left greater than right Neurologic: Alert and oriented X 3, normal strength and tone.  Normal symmetric reflexes. Normal coordination and gait Skin: skin rash resolved   ECOG PERFORMANCE STATUS: 2 - Symptomatic, <50% confined to bed  Blood pressure 146/70, pulse 106, temperature 97.4 F (36.3 C), temperature source Oral, resp. rate 22, height 5' 5"  (1.651 m), weight 126 lb 12.8 oz (57.516 kg), SpO2 90.00%.  LABORATORY DATA: Lab Results  Component Value Date   WBC 6.7 07/19/2013   HGB 11.2* 07/19/2013   HCT 33.1* 07/19/2013   MCV 99.2* 07/19/2013   PLT 275 07/19/2013      Chemistry      Component Value Date/Time   NA 140 07/19/2013 0921   NA 138 06/20/2013 0358   K 4.4 07/19/2013 0921   K 4.3 06/20/2013 0358   CL 94* 06/20/2013 0358   CL 94* 11/01/2012 1105   CO2 32* 07/19/2013 0921   CO2 34* 06/20/2013 0358   BUN 21.8 07/19/2013 0921   BUN 35* 06/20/2013 0358   CREATININE 1.0 07/19/2013 0921   CREATININE 1.18 06/20/2013  0358      Component Value Date/Time   CALCIUM 10.1 07/19/2013 0921   CALCIUM 9.4 06/20/2013 0358   ALKPHOS 116 07/19/2013 0921   ALKPHOS 128* 10/18/2012 0500   AST 21 07/19/2013 0921   AST 40* 10/18/2012 0500   ALT 15 07/19/2013 0921   ALT 34 10/18/2012 0500   BILITOT 0.23 07/19/2013 0921   BILITOT 0.9 10/18/2012 0500       RADIOGRAPHIC STUDIES:  ASSESSMENT AND PLAN: This is a very pleasant 73 years old white male with history of metastatic non-small cell lung cancer, adenocarcinoma status post systemic chemotherapy with carboplatin and Alimta followed by 2 months treatment with single agent Tarceva and one dose of gemcitabine discontinued secondary to her recent hospitalization with severe dyspnea. He is status post stereotactic radiotherapy treatment to 3 metastatic brain lesions completed 07/04/2013. He tolerated this procedure well. He is at his baseline neurologic status. The patient presents to proceed with participation in the [facility] clinical trial with Nivolumab.  Patient was discussed with and also seen by Dr. Julien Nordmann.He will proceed with the first cycle of immunotherapy with Nivolumab according to the BMS clinical trial today. For COPD, the patient will continue on prednisone 5 mg by mouth daily in addition to his current inhaler and nebulizer treatment. He will follow up in 2 weeks for a symptom management visit and to proceed with cycle #2.He was advised to call immediately if he has any concerning symptoms in the interval. The patient voices understanding of current disease status and treatment options and is in agreement with the current care plan.  All questions were answered. The patient knows to call the clinic with any problems, questions or concerns. We can certainly see the patient much sooner if necessary.  Carlton Adam, PA-C 07/19/2013  ADDENDUM: Hematology/Oncology Attending: I had a face to face encounter with the patient today. I recommended his care plan. This is  a very pleasant 73 years old white male with metastatic non-small cell lung cancer, adenocarcinoma status post several chemotherapy regimens and recently has evidence for disease progression. The patient is here today to start the first cycle of immunotherapy with Nivolumab according to the [facility] clinical trial [place] [id number]. He meets the eligibility criteria for the trial. We will proceed with the first cycle of his treatment today as scheduled. He would come back for followup visit in 2 weeks with the second cycle of his treatment. For history of COPD, the patient will continue  on prednisone 5 mg by mouth daily in addition to his inhaler and nebulizer. He was advised to call immediately if he has any concerning symptoms in the interval.  Disclaimer: This note was dictated with voice recognition software. Similar sounding words can inadvertently be transcribed and may not be corrected upon review. Eilleen Kempf., MD 07/20/2013

## 2013-07-20 NOTE — Patient Instructions (Signed)
Follow-up in 2 weeks

## 2013-07-22 ENCOUNTER — Other Ambulatory Visit: Payer: Self-pay | Admitting: Internal Medicine

## 2013-07-22 DIAGNOSIS — C7931 Secondary malignant neoplasm of brain: Secondary | ICD-10-CM

## 2013-07-22 DIAGNOSIS — D638 Anemia in other chronic diseases classified elsewhere: Secondary | ICD-10-CM

## 2013-07-22 DIAGNOSIS — C7949 Secondary malignant neoplasm of other parts of nervous system: Principal | ICD-10-CM

## 2013-07-26 ENCOUNTER — Telehealth: Payer: Self-pay | Admitting: Medical Oncology

## 2013-07-26 NOTE — Telephone Encounter (Signed)
Pt wife called me and asked me to talk to Luis Payne who is having a lot of unrelieved pain below left shoulder blade. I spoke to pt and he said he is taking percocet every 4 hours without relief. He could not describe pain but intensity very high.I told pt he needs to go to ED to be evaluated and get some pain relief. He said he would go.

## 2013-07-31 ENCOUNTER — Inpatient Hospital Stay (HOSPITAL_COMMUNITY): Payer: Medicare Other

## 2013-07-31 ENCOUNTER — Encounter (HOSPITAL_COMMUNITY): Payer: Self-pay | Admitting: Emergency Medicine

## 2013-07-31 ENCOUNTER — Emergency Department (HOSPITAL_COMMUNITY): Payer: Medicare Other

## 2013-07-31 ENCOUNTER — Inpatient Hospital Stay (HOSPITAL_COMMUNITY)
Admission: EM | Admit: 2013-07-31 | Discharge: 2013-08-04 | DRG: 205 | Disposition: A | Discharge status: Hospice | Payer: Medicare Other | Attending: Internal Medicine | Admitting: Internal Medicine

## 2013-07-31 DIAGNOSIS — IMO0002 Reserved for concepts with insufficient information to code with codable children: Secondary | ICD-10-CM | POA: Diagnosis present

## 2013-07-31 DIAGNOSIS — I5022 Chronic systolic (congestive) heart failure: Secondary | ICD-10-CM

## 2013-07-31 DIAGNOSIS — R531 Weakness: Secondary | ICD-10-CM

## 2013-07-31 DIAGNOSIS — D638 Anemia in other chronic diseases classified elsewhere: Secondary | ICD-10-CM | POA: Diagnosis present

## 2013-07-31 DIAGNOSIS — C7949 Secondary malignant neoplasm of other parts of nervous system: Secondary | ICD-10-CM

## 2013-07-31 DIAGNOSIS — Z7401 Bed confinement status: Secondary | ICD-10-CM

## 2013-07-31 DIAGNOSIS — J9819 Other pulmonary collapse: Principal | ICD-10-CM | POA: Diagnosis present

## 2013-07-31 DIAGNOSIS — Z7901 Long term (current) use of anticoagulants: Secondary | ICD-10-CM

## 2013-07-31 DIAGNOSIS — J9620 Acute and chronic respiratory failure, unspecified whether with hypoxia or hypercapnia: Secondary | ICD-10-CM | POA: Diagnosis present

## 2013-07-31 DIAGNOSIS — Z86711 Personal history of pulmonary embolism: Secondary | ICD-10-CM

## 2013-07-31 DIAGNOSIS — J189 Pneumonia, unspecified organism: Secondary | ICD-10-CM

## 2013-07-31 DIAGNOSIS — R918 Other nonspecific abnormal finding of lung field: Secondary | ICD-10-CM

## 2013-07-31 DIAGNOSIS — J9811 Atelectasis: Secondary | ICD-10-CM | POA: Diagnosis present

## 2013-07-31 DIAGNOSIS — C7931 Secondary malignant neoplasm of brain: Secondary | ICD-10-CM | POA: Diagnosis present

## 2013-07-31 DIAGNOSIS — I2699 Other pulmonary embolism without acute cor pulmonale: Secondary | ICD-10-CM

## 2013-07-31 DIAGNOSIS — C3410 Malignant neoplasm of upper lobe, unspecified bronchus or lung: Secondary | ICD-10-CM | POA: Diagnosis present

## 2013-07-31 DIAGNOSIS — T17408A Unspecified foreign body in trachea causing other injury, initial encounter: Secondary | ICD-10-CM | POA: Diagnosis present

## 2013-07-31 DIAGNOSIS — J910 Malignant pleural effusion: Secondary | ICD-10-CM | POA: Diagnosis present

## 2013-07-31 DIAGNOSIS — Z79899 Other long term (current) drug therapy: Secondary | ICD-10-CM

## 2013-07-31 DIAGNOSIS — J9601 Acute respiratory failure with hypoxia: Secondary | ICD-10-CM

## 2013-07-31 DIAGNOSIS — Z836 Family history of other diseases of the respiratory system: Secondary | ICD-10-CM

## 2013-07-31 DIAGNOSIS — J449 Chronic obstructive pulmonary disease, unspecified: Secondary | ICD-10-CM | POA: Diagnosis present

## 2013-07-31 DIAGNOSIS — D630 Anemia in neoplastic disease: Secondary | ICD-10-CM | POA: Diagnosis present

## 2013-07-31 DIAGNOSIS — J90 Pleural effusion, not elsewhere classified: Secondary | ICD-10-CM | POA: Diagnosis present

## 2013-07-31 DIAGNOSIS — R0600 Dyspnea, unspecified: Secondary | ICD-10-CM

## 2013-07-31 DIAGNOSIS — Z7982 Long term (current) use of aspirin: Secondary | ICD-10-CM

## 2013-07-31 DIAGNOSIS — J4489 Other specified chronic obstructive pulmonary disease: Secondary | ICD-10-CM | POA: Diagnosis present

## 2013-07-31 DIAGNOSIS — I509 Heart failure, unspecified: Secondary | ICD-10-CM | POA: Diagnosis present

## 2013-07-31 DIAGNOSIS — Z9981 Dependence on supplemental oxygen: Secondary | ICD-10-CM

## 2013-07-31 DIAGNOSIS — T17808A Unspecified foreign body in other parts of respiratory tract causing other injury, initial encounter: Secondary | ICD-10-CM

## 2013-07-31 DIAGNOSIS — Z515 Encounter for palliative care: Secondary | ICD-10-CM

## 2013-07-31 DIAGNOSIS — T17508A Unspecified foreign body in bronchus causing other injury, initial encounter: Secondary | ICD-10-CM

## 2013-07-31 DIAGNOSIS — R079 Chest pain, unspecified: Secondary | ICD-10-CM

## 2013-07-31 DIAGNOSIS — I10 Essential (primary) hypertension: Secondary | ICD-10-CM | POA: Diagnosis present

## 2013-07-31 DIAGNOSIS — J441 Chronic obstructive pulmonary disease with (acute) exacerbation: Secondary | ICD-10-CM

## 2013-07-31 DIAGNOSIS — Z66 Do not resuscitate: Secondary | ICD-10-CM | POA: Diagnosis present

## 2013-07-31 DIAGNOSIS — G893 Neoplasm related pain (acute) (chronic): Secondary | ICD-10-CM

## 2013-07-31 DIAGNOSIS — Z87891 Personal history of nicotine dependence: Secondary | ICD-10-CM

## 2013-07-31 DIAGNOSIS — H9190 Unspecified hearing loss, unspecified ear: Secondary | ICD-10-CM | POA: Diagnosis present

## 2013-07-31 DIAGNOSIS — M549 Dorsalgia, unspecified: Secondary | ICD-10-CM | POA: Diagnosis not present

## 2013-07-31 LAB — COMPREHENSIVE METABOLIC PANEL
ALT: 12 U/L (ref 0–53)
AST: 22 U/L (ref 0–37)
Albumin: 2.8 g/dL — ABNORMAL LOW (ref 3.5–5.2)
Alkaline Phosphatase: 116 U/L (ref 39–117)
BILIRUBIN TOTAL: 0.3 mg/dL (ref 0.3–1.2)
BUN: 25 mg/dL — AB (ref 6–23)
CO2: 29 mEq/L (ref 19–32)
CREATININE: 0.92 mg/dL (ref 0.50–1.35)
Calcium: 10.1 mg/dL (ref 8.4–10.5)
Chloride: 98 mEq/L (ref 96–112)
GFR calc Af Amer: >90 mL/min (ref >90)
GFR calc non Af Amer: 81 mL/min — ABNORMAL LOW (ref >90)
Glucose, Bld: 106 mg/dL — ABNORMAL HIGH (ref 70–99)
Potassium: 4.2 mEq/L (ref 3.7–5.3)
Sodium: 140 mEq/L (ref 137–147)
TOTAL PROTEIN: 6.8 g/dL (ref 6.0–8.3)

## 2013-07-31 LAB — CBC WITH DIFFERENTIAL/PLATELET
BASOS ABS: 0 10*3/uL (ref 0.0–0.1)
Basophils Relative: 0 % (ref 0–1)
EOS ABS: 0 10*3/uL (ref 0.0–0.7)
EOS PCT: 0 % (ref 0–5)
HCT: 34.5 % — ABNORMAL LOW (ref 39.0–52.0)
HEMOGLOBIN: 11.2 g/dL — AB (ref 13.0–17.0)
Lymphocytes Relative: 6 % — ABNORMAL LOW (ref 12–46)
Lymphs Abs: 0.5 10*3/uL — ABNORMAL LOW (ref 0.7–4.0)
MCH: 32.7 pg (ref 26.0–34.0)
MCHC: 32.5 g/dL (ref 30.0–36.0)
MCV: 100.9 fL — AB (ref 78.0–100.0)
MONO ABS: 0.6 10*3/uL (ref 0.1–1.0)
MONOS PCT: 8 % (ref 3–12)
NEUTROS ABS: 6.2 10*3/uL (ref 1.7–7.7)
Neutrophils Relative %: 85 % — ABNORMAL HIGH (ref 43–77)
Platelets: 269 10*3/uL (ref 150–400)
RBC: 3.42 MIL/uL — ABNORMAL LOW (ref 4.22–5.81)
RDW: 15.4 % (ref 11.5–15.5)
WBC: 7.3 10*3/uL (ref 4.0–10.5)

## 2013-07-31 LAB — PRO B NATRIURETIC PEPTIDE: Pro B Natriuretic peptide (BNP): 378.1 pg/mL — ABNORMAL HIGH (ref 0–125)

## 2013-07-31 LAB — TROPONIN I

## 2013-07-31 LAB — LACTIC ACID, PLASMA: LACTIC ACID, VENOUS: 1.3 mmol/L (ref 0.5–2.2)

## 2013-07-31 MED ORDER — HYDROMORPHONE HCL PF 1 MG/ML IJ SOLN: 1.0000 mg | INTRAMUSCULAR | Status: DC | PRN

## 2013-07-31 MED ORDER — VANCOMYCIN HCL 500 MG IV SOLR
500.0000 mg | Freq: Two times a day (BID) | INTRAVENOUS | Status: DC
  Administered 2013-08-01 – 2013-08-04 (×6): 500 mg via INTRAVENOUS
  Filled 2013-07-31 (×8): qty 500

## 2013-07-31 MED ORDER — FOLIC ACID 1 MG PO TABS
1.0000 mg | ORAL_TABLET | Freq: Every day | ORAL | Status: DC
  Administered 2013-08-01 – 2013-08-04 (×4): 1 mg via ORAL
  Filled 2013-07-31 (×4): qty 1

## 2013-07-31 MED ORDER — ALUM & MAG HYDROXIDE-SIMETH 200-200-20 MG/5ML PO SUSP: 30.0000 mL | Freq: Four times a day (QID) | ORAL | Status: DC | PRN

## 2013-07-31 MED ORDER — GUAIFENESIN ER 600 MG PO TB12
1200.0000 mg | ORAL_TABLET | Freq: Two times a day (BID) | ORAL | Status: DC
  Administered 2013-08-01 – 2013-08-04 (×7): 1200 mg via ORAL
  Filled 2013-07-31 (×9): qty 2

## 2013-07-31 MED ORDER — SODIUM CHLORIDE 0.9 % IJ SOLN: 3.0000 mL | INTRAMUSCULAR | Status: DC | PRN

## 2013-07-31 MED ORDER — ACETAMINOPHEN 325 MG PO TABS
650.0000 mg | ORAL_TABLET | Freq: Four times a day (QID) | ORAL | Status: DC | PRN
Start: 2013-07-31 — End: 2013-08-04

## 2013-07-31 MED ORDER — IPRATROPIUM BROMIDE 0.02 % IN SOLN
0.5000 mg | Freq: Four times a day (QID) | RESPIRATORY_TRACT | Status: DC
  Administered 2013-07-31 (×2): 0.5 mg via RESPIRATORY_TRACT
  Filled 2013-07-31 (×3): qty 2.5

## 2013-07-31 MED ORDER — ALBUTEROL SULFATE (2.5 MG/3ML) 0.083% IN NEBU
2.5000 mg | INHALATION_SOLUTION | RESPIRATORY_TRACT | Status: DC | PRN
  Administered 2013-08-02 – 2013-08-03 (×2): 2.5 mg via RESPIRATORY_TRACT
  Filled 2013-07-31 (×2): qty 3

## 2013-07-31 MED ORDER — SENNOSIDES-DOCUSATE SODIUM 8.6-50 MG PO TABS
2.0000 | ORAL_TABLET | Freq: Every day | ORAL | Status: DC
  Filled 2013-07-31 (×2): qty 2

## 2013-07-31 MED ORDER — ACETAMINOPHEN 650 MG RE SUPP: 650.0000 mg | Freq: Four times a day (QID) | RECTAL | Status: DC | PRN

## 2013-07-31 MED ORDER — ENOXAPARIN SODIUM 60 MG/0.6ML ~~LOC~~ SOLN
55.0000 mg | Freq: Once | SUBCUTANEOUS | Status: AC
  Administered 2013-07-31: 55 mg via SUBCUTANEOUS
  Filled 2013-07-31: qty 0.6

## 2013-07-31 MED ORDER — ONDANSETRON HCL 4 MG/2ML IJ SOLN: 4.0000 mg | Freq: Four times a day (QID) | INTRAMUSCULAR | Status: DC | PRN

## 2013-07-31 MED ORDER — CARVEDILOL 3.125 MG PO TABS
3.1250 mg | ORAL_TABLET | Freq: Two times a day (BID) | ORAL | Status: DC
  Administered 2013-07-31 – 2013-08-04 (×8): 3.125 mg via ORAL
  Filled 2013-07-31 (×11): qty 1

## 2013-07-31 MED ORDER — SODIUM CHLORIDE 0.9 % IJ SOLN: 3.0000 mL | Freq: Two times a day (BID) | INTRAMUSCULAR | Status: DC

## 2013-07-31 MED ORDER — VANCOMYCIN HCL IN DEXTROSE 1-5 GM/200ML-% IV SOLN
1000.0000 mg | INTRAVENOUS | Status: AC
  Administered 2013-07-31: 1000 mg via INTRAVENOUS
  Filled 2013-07-31: qty 200

## 2013-07-31 MED ORDER — ALBUTEROL SULFATE HFA 108 (90 BASE) MCG/ACT IN AERS
4.0000 | INHALATION_SPRAY | Freq: Once | RESPIRATORY_TRACT | Status: AC
  Administered 2013-07-31: 4 via RESPIRATORY_TRACT
  Filled 2013-07-31: qty 6.7

## 2013-07-31 MED ORDER — ACETYLCYSTEINE 20 % IN SOLN
3.0000 mL | Freq: Once | RESPIRATORY_TRACT | Status: DC
  Filled 2013-07-31 (×2): qty 4

## 2013-07-31 MED ORDER — OXYCODONE HCL 5 MG PO TABS
10.0000 mg | ORAL_TABLET | Freq: Once | ORAL | Status: AC
Start: 2013-07-31 — End: 2013-07-31
  Administered 2013-07-31: 10 mg via ORAL
  Filled 2013-07-31: qty 2

## 2013-07-31 MED ORDER — SODIUM CHLORIDE 0.9 % IJ SOLN
3.0000 mL | Freq: Two times a day (BID) | INTRAMUSCULAR | Status: DC
  Administered 2013-07-31 – 2013-08-04 (×2): 3 mL via INTRAVENOUS

## 2013-07-31 MED ORDER — LEVALBUTEROL HCL 0.63 MG/3ML IN NEBU
0.6300 mg | INHALATION_SOLUTION | Freq: Four times a day (QID) | RESPIRATORY_TRACT | Status: DC
  Administered 2013-07-31 (×2): 0.63 mg via RESPIRATORY_TRACT
  Filled 2013-07-31 (×7): qty 3

## 2013-07-31 MED ORDER — PREDNISONE 5 MG PO TABS
5.0000 mg | ORAL_TABLET | Freq: Every day | ORAL | Status: DC
  Filled 2013-07-31 (×2): qty 1

## 2013-07-31 MED ORDER — SODIUM CHLORIDE 0.9 % IV SOLN: 250.0000 mL | INTRAVENOUS | Status: DC | PRN

## 2013-07-31 MED ORDER — ENOXAPARIN SODIUM 60 MG/0.6ML ~~LOC~~ SOLN
1.0000 mg/kg | Freq: Two times a day (BID) | SUBCUTANEOUS | Status: DC
  Filled 2013-07-31 (×3): qty 0.6

## 2013-07-31 MED ORDER — ACETYLCYSTEINE 20 % IN SOLN
4.0000 mL | Freq: Three times a day (TID) | RESPIRATORY_TRACT | Status: DC
  Administered 2013-07-31 – 2013-08-01 (×5): 4 mL via RESPIRATORY_TRACT
  Filled 2013-07-31 (×9): qty 4

## 2013-07-31 MED ORDER — DEXTROSE 5 % IV SOLN
1.0000 g | Freq: Two times a day (BID) | INTRAVENOUS | Status: DC
  Administered 2013-07-31 – 2013-08-04 (×8): 1 g via INTRAVENOUS
  Filled 2013-07-31 (×9): qty 1

## 2013-07-31 MED ORDER — ONDANSETRON HCL 4 MG PO TABS: 4.0000 mg | ORAL_TABLET | Freq: Four times a day (QID) | ORAL | Status: DC | PRN

## 2013-07-31 MED ORDER — ENSURE COMPLETE PO LIQD
237.0000 mL | Freq: Two times a day (BID) | ORAL | Status: DC
  Administered 2013-07-31 – 2013-08-04 (×8): 237 mL via ORAL

## 2013-07-31 MED ORDER — DIGOXIN 125 MCG PO TABS
0.1250 mg | ORAL_TABLET | Freq: Every day | ORAL | Status: DC
  Administered 2013-08-01 – 2013-08-04 (×4): 0.125 mg via ORAL
  Filled 2013-07-31 (×4): qty 1

## 2013-07-31 MED ORDER — OXYCODONE HCL 5 MG PO TABS
15.0000 mg | ORAL_TABLET | ORAL | Status: DC | PRN
  Administered 2013-07-31 – 2013-08-01 (×4): 15 mg via ORAL
  Filled 2013-07-31 (×5): qty 3

## 2013-07-31 NOTE — Progress Notes (Signed)
ANTIBIOTIC CONSULT NOTE - INITIAL  Pharmacy Consult for Vancomycin, Cefepime Indication: pneumonia  No Known Allergies  Patient Measurements: Height: 5\' 5"  (165.1 cm) Weight: 123 lb 3.2 oz (55.883 kg) IBW/kg (Calculated) : 61.5  Vital Signs: Temp: 97.4 F (36.3 C) (03/22 1500) Temp src: Oral (03/22 1500) BP: 137/67 mmHg (03/22 1500) Pulse Rate: 103 (03/22 1500)  Labs:  Recent Labs  07/31/13 1120  WBC 7.3  HGB 11.2*  PLT 269  CREATININE 0.92   Estimated Creatinine Clearance: 55.7 ml/min (by C-G formula based on Cr of 0.92).  Microbiology: No results found for this or any previous visit (from the past 720 hour(s)).  Medical History: Past Medical History  Diagnosis Date  . Substance abuse     TOBACCO  . Lung mass   . 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 Brain metastases 08/18/12    mri-  . HOH (hard of hearing)     wears hearing aids  . COPD (chronic obstructive pulmonary disease)   . Systolic heart failure     EF of 30 to 35% per echo in June 2014  . Lung cancer   . Legionella pneumonia   . CHF (congestive heart failure)     Medications:  Anti-infectives   None     Assessment: 58 yoM admitted 3/22 with worsening SOB over the past 2 days.  PMH significant for metastatic lung cancer, COPD, home O2 and PE.  Pharmacy is consulted to dose cefepime and vancomycin for r/o pneumonia, HCAP.  Tmax: afebrile  WBCs: 7.3  Renal: SCr 0.92, CrCl ~ 56 ml/min.  Goal of Therapy:  Vancomycin trough level 15-20 mcg/ml  Plan:   Cefepime 1g IV q12h  Vancomycin 1g IV now, then 500 mg IV q12 h.  Measure Vanc trough at steady state.  Follow up renal fxn and culture results.  Gretta Arab PharmD, BCPS Pager [phone] 07/31/2013 3:52 PM

## 2013-07-31 NOTE — ED Notes (Signed)
He states he initiated (first treatment) of chemo. For ling cancer the 10th of this month.  He is here today for increased shortness of breath; also, he has noted a "lump" at left flank area (this is quite visible at 'love handle' area.  He arrives with his oxygen concentrator, which he has set at 5 l.p.m.

## 2013-07-31 NOTE — ED Notes (Signed)
Pt to CT

## 2013-07-31 NOTE — H&P (Signed)
PATIENT DETAILS Name: Luis Payne Age: 73 y.o. Sex: male Date of Birth: February 18, 1941 Admit Date: 07/31/2013 GQQ:[id number],KDTOIZTI J., MD   CHIEF COMPLAINT:  Worsening shortness of breath for the past 2 days  HPI: Luis Payne is a 73 y.o. male with a Past Medical History metastatic lung cancer, COPD, chronic respiratory failure on home O2 4 L/minute  who presents today with the above noted complaint. Per patient, he was in his usual state of health a few days ago, he then noted that he was getting much more short of breath than usual. He also complains of cough with yellow phlegm. Denies any fever. Denies any ongoing chest pain abdominal pain. Because progressively worsened shortness of breath, patient increase his oxygen from 4 to 5 L. He subsequently presented to the emergency room, where he was found to have complete opacification of his left lung on chest x-ray. I was subsequently asked to admit this patient for further evaluation and treatment. Patient denies any headache, fever, nausea, vomiting, diarrhea, chest pain, abdominal pain.   ALLERGIES:  No Known Allergies  PAST MEDICAL HISTORY: Past Medical History  Diagnosis Date  . Substance abuse     TOBACCO  . Lung mass   . 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 Brain metastases 08/18/12    mri-  . HOH (hard of hearing)     wears hearing aids  . COPD (chronic obstructive pulmonary disease)   . Systolic heart failure     EF of 30 to 35% per echo in June 2014  . Lung cancer   . Legionella pneumonia   . CHF (congestive heart failure)     PAST SURGICAL HISTORY: Past Surgical History  Procedure Laterality Date  . Wrist surgery    . Lung biopsy Left 08/17/12    LUL lung mass-adenocarcinoma  . Ganglion cyst excision    . Nose surgery      MEDICATIONS AT HOME: Prior to Admission medications   Medication Sig Start Date End Date Taking? Authorizing Provider  acetaminophen (TYLENOL) 650 MG CR tablet Take 1,300 mg  by mouth every 8 (eight) hours as needed for pain.   Yes Historical Provider, MD  Ascorbic Acid (VITAMIN C) 1000 MG tablet Take 1,000 mg by mouth daily.   Yes Historical Provider, MD  aspirin 81 MG tablet Take 162 mg by mouth daily.    Yes Historical Provider, MD  carvedilol (COREG) 3.125 MG tablet Take 1 tablet (3.125 mg total) by mouth 2 (two) times daily. 12/29/12  Yes Burtis Junes, NP  digoxin (LANOXIN) 0.125 MG tablet Take 1 tablet (0.125 mg total) by mouth daily. 12/29/12  Yes Burtis Junes, NP  enoxaparin (LOVENOX) 60 MG/0.6ML injection Inject 0.6 mLs (60 mg total) into the skin daily. 05/07/13  Yes Estela Leonie Green, MD  feeding supplement (ENSURE COMPLETE) LIQD Take 237 mLs by mouth 2 (two) times daily between meals. 10/22/12  Yes Theodis Blaze, MD  folic acid (FOLVITE) 1 MG tablet Take 1 mg by mouth daily.   Yes Historical Provider, MD  guaiFENesin (MUCINEX) 600 MG 12 hr tablet Take 600 mg by mouth daily.   Yes Historical Provider, MD  levalbuterol Penne Lash) 0.63 MG/3ML nebulizer solution Take 3 mLs (0.63 mg total) by nebulization every 4 (four) hours as needed for wheezing. 11/02/12  Yes Curt Bears, MD  oxyCODONE-acetaminophen (PERCOCET) 10-325 MG per tablet Take 1 tablet by mouth every 4 (four)  hours as needed for pain.   Yes Historical Provider, MD  predniSONE (DELTASONE) 5 MG tablet Take 5 mg by mouth daily with breakfast.   Yes Historical Provider, MD  PRESCRIPTION MEDICATION Chemo-Dr Julien Nordmann .Nivolumab infusion   Yes Historical Provider, MD  senna-docusate (SENOKOT-S) 8.6-50 MG per tablet Take 1 tablet by mouth daily.   Yes Historical Provider, MD  OXYGEN-HELIUM IN Inhale 4 L into the lungs.    Historical Provider, MD    FAMILY HISTORY: Family History  Problem Relation Age of Onset  . COPD Father   . Cancer Sister   . Diabetes Brother     SOCIAL HISTORY:  reports that he quit smoking about 10 months ago. His smoking use included Cigarettes. He has a 60  pack-year smoking history. He has never used smokeless tobacco. He reports that he does not drink alcohol or use illicit drugs.  REVIEW OF SYSTEMS:  Constitutional:   No  weight loss, night sweats,  Fevers, chills.  HEENT:    No headaches, Difficulty swallowing,Tooth/dental problems,Sore throat,  No sneezing, itching, ear ache, nasal congestion, post nasal drip,   Cardio-vascular: No chest pain,  Orthopnea, PND, swelling in lower extremities, anasarca,dizziness, palpitations  GI:  No heartburn, indigestion, abdominal pain, nausea, vomiting, diarrhea, change in bowel habits, loss of appetite  Resp:   No coughing up of blood.No wheezing.No chest wall deformity  Skin:  no rash or lesions.  GU:  no dysuria, change in color of urine, no urgency or frequency.  No flank pain.  Musculoskeletal: No joint pain or swelling.  No decreased range of motion.  No back pain.  Psych: No change in mood or affect. No depression or anxiety.  No memory loss.   PHYSICAL EXAM: Blood pressure 137/67, pulse 103, temperature 97.4 F (36.3 C), temperature source Oral, resp. rate 28, height 5\' 5"  (1.651 m), weight 55.883 kg (123 lb 3.2 oz), SpO2 96.00%.  General appearance :Awake, alert, not in any distress. Speech Clear. Not toxic Looking HEENT: Atraumatic and Normocephalic, pupils equally reactive to light and accomodation Neck: supple, no JVD. No cervical lymphadenopathy.  Chest: Good air entry on the right, some entry on the left upper lobe. CVS: S1 S2 regular, no murmurs.  Abdomen: Bowel sounds present, Non tender and not distended with no gaurding, rigidity or rebound. Extremities: B/L Lower Ext shows no edema, both legs are warm to touch Neurology: Awake alert, and oriented X 3, CN II-XII intact, Non focal Skin:No Rash Wounds:N/A  LABS ON ADMISSION:   Recent Labs  07/31/13 1120  NA 140  K 4.2  CL 98  CO2 29  GLUCOSE 106*  BUN 25*  CREATININE 0.92  CALCIUM 10.1    Recent Labs   07/31/13 1120  AST 22  ALT 12  ALKPHOS 116  BILITOT 0.3  PROT 6.8  ALBUMIN 2.8*   No results found for this basename: LIPASE, AMYLASE,  in the last 72 hours  Recent Labs  07/31/13 1120  WBC 7.3  NEUTROABS 6.2  HGB 11.2*  HCT 34.5*  MCV 100.9*  PLT 269    Recent Labs  07/31/13 1120  TROPONINI <0.30   No results found for this basename: DDIMER,  in the last 72 hours No components found with this basename: POCBNP,    RADIOLOGIC STUDIES ON ADMISSION: Ct Chest Wo Contrast  07/31/2013   CLINICAL DATA:  Worsening shortness of breath. Currently undergoing chemotherapy for lung carcinoma.  EXAM: CT CHEST WITHOUT CONTRAST  TECHNIQUE: Multidetector CT imaging  of the chest was performed following the standard protocol without IV contrast.  COMPARISON:  07/14/2013  FINDINGS: Comparison with previous study is somewhat limited by lack of intravenous contrast on current exam. Large malignant left pleural effusion with thick rind of tumor throughout the left pleural space shows no significant change in size. Compressive left lower lobe atelectasis again demonstrated. Left anterior apical lung mass with adjacent pneumonitis is also similar in size. Increased consolidation is seen in the left upper lobe and lingula when compared to previous study, with near complete opacification of the left lung now demonstrated.  Severe emphysema again seen involving the right lung, with large subpleural bleb again seen in the superior right lower lobe. 8 x 9 mm pulmonary nodule in the anterior right middle lobe on image 38 is stable in size. No new or enlarging pulmonary nodules or masses are seen in the right lung. No evidence of mediastinal or right hilar lymphadenopathy.  Visualized portions of the adrenal glands remain normal in appearance. Upper abdominal lymphadenopathy at the level of the celiac axis shows mild increase, with largest lymph node measuring 12 mm on image 51 compared to 10 mm previously. No  suspicious bone lesions identified.  IMPRESSION: Increased pulmonary consolidation seen in left upper lobe and lingula.  No significant change in large malignant left pleural effusion, pleural tumor, or mass seen anterior left lung apex.  Stable 9 mm right middle lobe nodule and severe emphysema.  Slight increase in upper abdominal lymphadenopathy adjacent celiac axis.   Electronically Signed   By: Earle Gell M.D.   On: 07/31/2013 14:54   Dg Chest Port 1 View  07/31/2013   CLINICAL DATA:  Increasing shortness of breath, chest pain, lung cancer, on chemotherapy, history COPD, CHF, former smoker  EXAM: PORTABLE CHEST - 1 VIEW  COMPARISON:  CT chest 07/14/2013  FINDINGS: Subtotal opacification of the left hemi thorax by a combination of pleural effusion, increased atelectasis and tumor.  COPD changes with right basilar atelectasis versus scarring.  No definite right lung infiltrate.  Bones demineralized.  Cardiac margins obscured.  IMPRESSION: Marked opacification of the left hemi thorax by combination of tumor, left pleural effusion, and significant left lung atelectasis.  COPD changes with right basilar atelectasis versus scarring.   Electronically Signed   By: Lavonia Dana M.D.   On: 07/31/2013 11:24     EKG: Independently reviewed. Normal sinus  ASSESSMENT AND PLAN: Present on Admission:  . Complete opacification of the left lung  - Multifactorial-likely from mucous plugging, postobstructive pneumonia, lung cancer, pleural effusion.  - Although requiring slightly more oxygen than usual baseline, he seems to be tolerating this rather well and is in not in any acute distress. - Patient will be admit to a telemetry unit, will be placed on scheduled nebulized bronchodilators, Mucomyst nebulizer, Mucinex, empiric antibiotic therapy and chest physical therapy will also be ordered. We will repeat a chest x-ray in a.m. Have discussed with pulmonology, Dr. Toribio Harbour has reviewed the radiologic studies and  agrees with this plan. Pulmonology will consult tomorrow, may need to consider thoracocentesis to see if this will provide any further relief the patient if the above measures do not work.  . Acute-on-chronic respiratory failure - Secondary to above, continue oxygen and attempted titrate down to usual 4 L based on clinical response   . Malignant neoplasm of upper lobe, bronchus or lung - Will need to notify patient's primary oncologist-Dr. Rogue Jury of patient's admission  . Anemia, chronic disease -  Secondary to malignancy, continue to monitor CBC periodically   . Hypertension  - Continue Coreg  . History of pulmonary embolism - Continue Lovenox. Patient has a small anterior wall hematoma-we'll continue to monitor.  Marland Kitchen History of malignant pleural effusion - See above. May need thoracocentesis depending on clinical course.  . 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  . History of COPD - Current shortness of breath likely secondary to above conditions rather than exacerbation of COPD. Continue with chronic steroids, nebulized bronchodilators.  Further plan will depend as patient's clinical course evolves and further radiologic and laboratory data become available. Patient will be monitored closely.   Above noted plan was discussed with patient/family, they were in agreement.   DVT Prophylaxis: Not needed as on therapeutic Lovenox  Code Status: DNR- confirmed with patient and spouse   Total time spent for admission equals 45 minutes.  [facility] Pager [phone]  If 7PM-7AM, please contact night-coverage [email or web address] Password [facility] 07/31/2013, 3:24 PM

## 2013-07-31 NOTE — ED Provider Notes (Signed)
CSN: [id number]     Arrival date & time 07/31/13  6803 History   First MD Initiated Contact with Patient 07/31/13 1011     Chief Complaint  Patient presents with  . Shortness of Breath  . Lung Cancer  . Edema     (Consider location/radiation/quality/duration/timing/severity/associated sxs/prior Treatment) Patient is a 73 y.o. male presenting with shortness of breath.  Shortness of Breath Severity:  Severe Onset quality:  Gradual Duration: a few days. Timing:  Constant Progression:  Worsening Chronicity:  Recurrent Context comment:  Hx of lung CA and left sided effusions Relieved by:  Rest and oxygen (increased O2 req'ment) Worsened by:  Exertion Associated symptoms: cough and sputum production   Associated symptoms: no abdominal pain, no chest pain and no fever   Associated symptoms comment:  Left back and shoulder pain   Past Medical History  Diagnosis Date  . Substance abuse     TOBACCO  . Lung mass   . 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 Brain metastases 08/18/12    mri-  . HOH (hard of hearing)     wears hearing aids  . COPD (chronic obstructive pulmonary disease)   . Systolic heart failure     EF of 30 to 35% per echo in June 2014  . Lung cancer   . Legionella pneumonia   . CHF (congestive heart failure)    Past Surgical History  Procedure Laterality Date  . Wrist surgery    . Lung biopsy Left 08/17/12    LUL lung mass-adenocarcinoma  . Ganglion cyst excision    . Nose surgery     Family History  Problem Relation Age of Onset  . COPD Father   . Cancer Sister   . Diabetes Brother    History  Substance Use Topics  . Smoking status: Former Smoker -- 1.00 packs/day for 60 years    Types: Cigarettes    Quit date: 09/27/2012  . Smokeless tobacco: Never Used  . Alcohol Use: No    Review of Systems  Constitutional: Negative for fever.  Respiratory: Positive for cough, sputum production and shortness of breath.   Cardiovascular: Negative for  chest pain.  Gastrointestinal: Negative for abdominal pain.  All other systems reviewed and are negative.      Allergies  Review of patient's allergies indicates no known allergies.  Home Medications   Current Outpatient Rx  Name  Route  Sig  Dispense  Refill  . acetaminophen (TYLENOL) 650 MG CR tablet   Oral   Take 1,300 mg by mouth every 8 (eight) hours as needed for pain.         . Ascorbic Acid (VITAMIN C) 1000 MG tablet   Oral   Take 1,000 mg by mouth daily.         Marland Kitchen aspirin 81 MG tablet   Oral   Take 162 mg by mouth daily.          . carvedilol (COREG) 3.125 MG tablet   Oral   Take 1 tablet (3.125 mg total) by mouth 2 (two) times daily.   180 tablet   3   . digoxin (LANOXIN) 0.125 MG tablet   Oral   Take 1 tablet (0.125 mg total) by mouth daily.   90 tablet   3   . enoxaparin (LOVENOX) 60 MG/0.6ML injection   Subcutaneous   Inject 0.6 mLs (60 mg total) into the skin daily.   90 Syringe   1   .  feeding supplement (ENSURE COMPLETE) LIQD   Oral   Take 237 mLs by mouth 2 (two) times daily between meals.   30 Bottle   5   . folic acid (FOLVITE) 1 MG tablet   Oral   Take 1 mg by mouth daily.         Marland Kitchen guaiFENesin (MUCINEX) 600 MG 12 hr tablet   Oral   Take 600 mg by mouth daily.         Marland Kitchen levalbuterol (XOPENEX) 0.63 MG/3ML nebulizer solution   Nebulization   Take 3 mLs (0.63 mg total) by nebulization every 4 (four) hours as needed for wheezing.   3 mL   120     Dr Julien Nordmann is only ordering one month supply then  ...   . oxyCODONE-acetaminophen (PERCOCET) 10-325 MG per tablet   Oral   Take 1 tablet by mouth every 4 (four) hours as needed for pain.         . predniSONE (DELTASONE) 5 MG tablet   Oral   Take 5 mg by mouth daily with breakfast.         . PRESCRIPTION MEDICATION      Chemo-Dr Julien Nordmann .Nivolumab infusion         . senna-docusate (SENOKOT-S) 8.6-50 MG per tablet   Oral   Take 1 tablet by mouth daily.          . OXYGEN-HELIUM IN   Inhalation   Inhale 4 L into the lungs.          BP 138/66  Pulse 104  Temp(Src) 97.6 F (36.4 C) (Oral)  Resp 20  SpO2 89% Physical Exam  Nursing note and vitals reviewed. Constitutional: He is oriented to person, place, and time. He appears well-developed and well-nourished. No distress.  HENT:  Head: Normocephalic and atraumatic.  Mouth/Throat: Oropharynx is clear and moist.  Eyes: Conjunctivae are normal. Pupils are equal, round, and reactive to light. No scleral icterus.  Neck: Neck supple.  Cardiovascular: Normal rate, regular rhythm, normal heart sounds and intact distal pulses.   No murmur heard. Pulmonary/Chest: Effort normal. No stridor. No respiratory distress. He has decreased breath sounds in the left middle field and the left lower field. He has no wheezes.  Abdominal: Soft. He exhibits mass (left flank underlying lovenox injection site). He exhibits no distension. There is no tenderness. There is no rigidity, no rebound and no guarding.  Musculoskeletal: Normal range of motion. He exhibits no edema.  Neurological: He is alert and oriented to person, place, and time.  Skin: Skin is warm and dry. No rash noted.  Psychiatric: He has a normal mood and affect. His behavior is normal.    ED Course  Procedures (including critical care time) Labs Review Labs Reviewed  CBC WITH DIFFERENTIAL - Abnormal; Notable for the following:    RBC 3.42 (*)    Hemoglobin 11.2 (*)    HCT 34.5 (*)    MCV 100.9 (*)    Neutrophils Relative % 85 (*)    Lymphocytes Relative 6 (*)    Lymphs Abs 0.5 (*)    All other components within normal limits  COMPREHENSIVE METABOLIC PANEL - Abnormal; Notable for the following:    Glucose, Bld 106 (*)    BUN 25 (*)    Albumin 2.8 (*)    GFR calc non Af Amer 81 (*)    All other components within normal limits  PRO B NATRIURETIC PEPTIDE - Abnormal; Notable for the following:  Pro B Natriuretic peptide (BNP) 378.1 (*)     All other components within normal limits  LACTIC ACID, PLASMA  TROPONIN I   Imaging Review Dg Chest Port 1 View  07/31/2013   CLINICAL DATA:  Increasing shortness of breath, chest pain, lung cancer, on chemotherapy, history COPD, CHF, former smoker  EXAM: PORTABLE CHEST - 1 VIEW  COMPARISON:  CT chest 07/14/2013  FINDINGS: Subtotal opacification of the left hemi thorax by a combination of pleural effusion, increased atelectasis and tumor.  COPD changes with right basilar atelectasis versus scarring.  No definite right lung infiltrate.  Bones demineralized.  Cardiac margins obscured.  IMPRESSION: Marked opacification of the left hemi thorax by combination of tumor, left pleural effusion, and significant left lung atelectasis.  COPD changes with right basilar atelectasis versus scarring.   Electronically Signed   By: Lavonia Dana M.D.   On: 07/31/2013 11:24  All radiology studies independently viewed by me.      EKG Interpretation   Date/Time:  Sunday July 31 2013 10:00:20 EDT Ventricular Rate:  97 PR Interval:  125 QRS Duration: 81 QT Interval:  309 QTC Calculation: 392 R Axis:   31 Text Interpretation:  Sinus rhythm Nonspecific repol abnormality, diffuse  leads Baseline wander in lead(s) V2 V4 V6 No significant change was found  Confirmed by [facility]  MD, TREY ([id number]) on 07/31/2013 11:25:28 AM      MDM   Final diagnoses:  Acute-on-chronic respiratory failure  Brain metastases  Collapse of left lung  HTN (hypertension)    73 year old male with a history of lung cancer presenting with shortness of breath.  He is normally on 4 L of home O2, but has had to increase this to 5 L. On arrival, he had moderately increased work of breathing and decreased breath sounds on the left. His work of breathing improved somewhat after albuterol. Chest x-ray showed left lung tumor, atelectasis, and effusion. He has had no fevers and has no leukocytosis. Discussed his case with Dr. Sloan Leiter, who  requested CT imaging.  Patient also has what appears to be a small to moderate sized subcutaneous hematoma associated with his Lovenox injection on the left flank. He has no abdominal pain or tenderness in his abdomen is soft. Do not think this needs further emergent evaluation.  Houston Siren III, MD 07/31/13 [phone]

## 2013-08-01 ENCOUNTER — Inpatient Hospital Stay (HOSPITAL_COMMUNITY): Payer: Medicare Other

## 2013-08-01 DIAGNOSIS — Z515 Encounter for palliative care: Secondary | ICD-10-CM

## 2013-08-01 DIAGNOSIS — J9819 Other pulmonary collapse: Principal | ICD-10-CM

## 2013-08-01 DIAGNOSIS — J9620 Acute and chronic respiratory failure, unspecified whether with hypoxia or hypercapnia: Secondary | ICD-10-CM

## 2013-08-01 DIAGNOSIS — C3410 Malignant neoplasm of upper lobe, unspecified bronchus or lung: Secondary | ICD-10-CM

## 2013-08-01 DIAGNOSIS — R5381 Other malaise: Secondary | ICD-10-CM

## 2013-08-01 DIAGNOSIS — I2699 Other pulmonary embolism without acute cor pulmonale: Secondary | ICD-10-CM

## 2013-08-01 DIAGNOSIS — J90 Pleural effusion, not elsewhere classified: Secondary | ICD-10-CM

## 2013-08-01 DIAGNOSIS — R5383 Other fatigue: Secondary | ICD-10-CM

## 2013-08-01 LAB — BASIC METABOLIC PANEL
BUN: 24 mg/dL — AB (ref 6–23)
CHLORIDE: 96 meq/L (ref 96–112)
CO2: 31 mEq/L (ref 19–32)
Calcium: 9.8 mg/dL (ref 8.4–10.5)
Creatinine, Ser: 0.99 mg/dL (ref 0.50–1.35)
GFR calc Af Amer: >90 mL/min (ref >90)
GFR, EST NON AFRICAN AMERICAN: 79 mL/min — AB (ref >90)
Glucose, Bld: 106 mg/dL — ABNORMAL HIGH (ref 70–99)
POTASSIUM: 4.4 meq/L (ref 3.7–5.3)
SODIUM: 137 meq/L (ref 137–147)

## 2013-08-01 LAB — CBC
HEMATOCRIT: 32.5 % — AB (ref 39.0–52.0)
HEMOGLOBIN: 10.3 g/dL — AB (ref 13.0–17.0)
MCH: 32.1 pg (ref 26.0–34.0)
MCHC: 31.7 g/dL (ref 30.0–36.0)
MCV: 101.2 fL — ABNORMAL HIGH (ref 78.0–100.0)
Platelets: 266 10*3/uL (ref 150–400)
RBC: 3.21 MIL/uL — AB (ref 4.22–5.81)
RDW: 15.5 % (ref 11.5–15.5)
WBC: 6.7 10*3/uL (ref 4.0–10.5)

## 2013-08-01 LAB — PROCALCITONIN: Procalcitonin: 0.21 ng/mL

## 2013-08-01 LAB — PRO B NATRIURETIC PEPTIDE: Pro B Natriuretic peptide (BNP): 422 pg/mL — ABNORMAL HIGH (ref 0–125)

## 2013-08-01 LAB — DIGOXIN LEVEL: DIGOXIN LVL: 0.7 ng/mL — AB (ref 0.8–2.0)

## 2013-08-01 MED ORDER — PREDNISONE 20 MG PO TABS
40.0000 mg | ORAL_TABLET | Freq: Every day | ORAL | Status: DC
  Administered 2013-08-01: 40 mg via ORAL
  Filled 2013-08-01 (×3): qty 2

## 2013-08-01 MED ORDER — GADOBENATE DIMEGLUMINE 529 MG/ML IV SOLN
15.0000 mL | Freq: Once | INTRAVENOUS | Status: AC | PRN
  Administered 2013-08-01: 11 mL via INTRAVENOUS

## 2013-08-01 MED ORDER — IPRATROPIUM BROMIDE 0.02 % IN SOLN
0.5000 mg | Freq: Four times a day (QID) | RESPIRATORY_TRACT | Status: DC
  Administered 2013-08-01 (×4): 0.5 mg via RESPIRATORY_TRACT
  Filled 2013-08-01 (×4): qty 2.5

## 2013-08-01 MED ORDER — HYDROMORPHONE HCL PF 1 MG/ML IJ SOLN
1.0000 mg | Freq: Once | INTRAMUSCULAR | Status: AC | PRN
  Administered 2013-08-01: 1 mg via INTRAVENOUS
  Filled 2013-08-01: qty 1

## 2013-08-01 MED ORDER — SENNOSIDES-DOCUSATE SODIUM 8.6-50 MG PO TABS
2.0000 | ORAL_TABLET | Freq: Two times a day (BID) | ORAL | Status: DC
  Administered 2013-08-01 – 2013-08-02 (×4): 2 via ORAL
  Filled 2013-08-01 (×6): qty 2

## 2013-08-01 MED ORDER — MORPHINE SULFATE (CONCENTRATE) 10 MG /0.5 ML PO SOLN
5.0000 mg | Freq: Four times a day (QID) | ORAL | Status: DC
  Administered 2013-08-01: 5 mg via ORAL
  Filled 2013-08-01: qty 0.5

## 2013-08-01 MED ORDER — LEVALBUTEROL HCL 0.63 MG/3ML IN NEBU
0.6300 mg | INHALATION_SOLUTION | Freq: Four times a day (QID) | RESPIRATORY_TRACT | Status: DC
  Administered 2013-08-01 (×4): 0.63 mg via RESPIRATORY_TRACT
  Filled 2013-08-01 (×8): qty 3

## 2013-08-01 MED ORDER — MORPHINE SULFATE (CONCENTRATE) 10 MG /0.5 ML PO SOLN
5.0000 mg | Freq: Four times a day (QID) | ORAL | Status: DC
  Administered 2013-08-01 – 2013-08-03 (×8): 10 mg via ORAL
  Filled 2013-08-01 (×9): qty 0.5

## 2013-08-01 MED ORDER — ENOXAPARIN SODIUM 60 MG/0.6ML ~~LOC~~ SOLN
1.0000 mg/kg | SUBCUTANEOUS | Status: DC
  Administered 2013-08-01: 60 mg via SUBCUTANEOUS
  Filled 2013-08-01 (×2): qty 0.6

## 2013-08-01 NOTE — Progress Notes (Signed)
CHART NOTE The patient is seen this evening and his wife was at the bedside. He was admitted yesterday with worsening dyspnea and repeat CT scan of the chest showed almost complete consolidation of the left lung. The patient has similar findings in the past. He also has a history of COPD and was on a maintenance dose of prednisone 5 mg by mouth daily. He has metastatic non-small cell lung cancer with evidence for recent disease progression and the patient was started last week on the first cycle of clinical trial with immunotherapy with Nivolumab. He did not have any significant adverse effects from the treatment with Nivolumab but his worsening dyspnea is most likely secondary to his disease and COPD. His dose of prednisone was increased to 40 mg by mouth daily. The patient is feeling a little bit better today. He was seen by Dr. Chase Caller from pulmonary medicine. He also has a history of congestive heart failure and may benefit from mild diuresis. His immunotherapy will be on hold for now. Thank you so much for taking good care of [name],, I will continue to follow the patient with you and assist in his management an as-needed basis. Please call if you have any questions.

## 2013-08-01 NOTE — Consult Note (Signed)
PULMONARY  / CRITICAL CARE MEDICINE  Name: Luis Payne MRN: [id number] DOB: October 20, 1940 PCP Glo Herring., MD    ADMISSION DATE:  07/31/2013  LOS 1 days   CONSULTATION DATE:  08/01/13  REFERRING MD :  [facility] PRIMARY SERVICE: TRH  CHIEF COMPLAINT:  Dyspnea  BRIEF PATIENT DESCRIPTION: see HPI  SIGNIFICANT EVENTS / STUDIES:  07/31/2013 - admit   LINES / TUBES: none  CULTURES: none  ANTIBIOTICS: Anti-infectives   Start     Dose/Rate Route Frequency Ordered Stop   08/01/13 0600  vancomycin (VANCOCIN) 500 mg in sodium chloride 0.9 % 100 mL IVPB     500 mg 100 mL/hr over 60 Minutes Intravenous Every 12 hours 07/31/13 1552     07/31/13 1600  vancomycin (VANCOCIN) IVPB 1000 mg/200 mL premix     1,000 mg 200 mL/hr over 60 Minutes Intravenous NOW 07/31/13 1552 07/31/13 1838   07/31/13 1600  ceFEPIme (MAXIPIME) 1 g in dextrose 5 % 50 mL IVPB     1 g 100 mL/hr over 30 Minutes Intravenous Every 12 hours 07/31/13 1552         HISTORY OF PRESENT ILLNESS:    JOUSHUA DUGAR is a 73 y.o. male with a Past Medical History metastatic lung cancer, COPD, chronic respiratory failure on home O2 4 L/minute who presents today with the above noted complaint. Per patient, he was in his usual state of health a few days ago/few weeks ago, he then noted that he was getting much more short of breath than usual. He also complains of cough with yellow phlegm. Denies any fever. Denies any ongoing chest pain abdominal pain. Because progressively worsened shortness of breath, patient increase his oxygen from 4 to 5 L. Dyspnea is class 3 level and is leaving him bed bound. He subsequently presented to the emergency room, where he was found to have complete opacification of his left lung on chest x-ray.     Patient denies any headache, fever, nausea, vomiting, diarrhea, chest pain, abdominal pain but past few months having severe persistent back pain that he thinks is from cancer. His current oxycodne  regimen is insufficient per him. No associated radiation, bladder or bowel distrubance  He and his wife want to focus on quality of life    DIAGNOSIS: Metastatic non-small cell lung cancer, adenocarcinoma with negative EGFR mutation and negative ALK gene translocation diagnosed in April 2014  PRIOR THERAPY:  1. Status post stereotactic radiotherapy to 2 brain lesions under the care of Dr. Tammi Klippel.  2. Status post palliative radiotherapy to the left lung mass.  3. Systemic chemotherapy with carboplatin for AUC of 5 and Alimta 500 mg/M2 every 3 weeks, status post 6 cycles. 4. Tarceva 150 mg by mouth daily. Started 02/25/2013. Status post approximately two month of therapy discontinued today secondary to disease progression. 5. Systemic chemotherapy with gemcitabine 1000 mg/M2 on days 1 and 8 every 3 weeks. First dose on 04/26/2013. 6. Stereotactic radiotherapy to 3 metastatic brain lesions completed on 07/04/2013.- SALVAGE 7.  CURRENT THERAPY: BMS immunotherapy clinical trial with Nivolumab. First cycle expected today (07/19/2013)  CHEMOTHERAPY INTENT: Palliative  CURRENT # OF CHEMOTHERAPY CYCLES: 0  CURRENT ANTIEMETICS: Compazine  CURRENT SMOKING STATUS: Former smoker, quit 09/28/2012  ORAL CHEMOTHERAPY AND CONSENT: Tarceva and the patient signed consent.  CURRENT BISPHOSPHONATES USE: none  PAIN MANAGEMENT: Percocet  NARCOTICS INDUCED CONSTIPATION: none  LIVING WILL AND CODE STATUS: No code Blue    PAST MEDICAL HISTORY :  Past Medical History  Diagnosis Date  . Substance abuse     TOBACCO  . Lung mass   . 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 Brain metastases 08/18/12    mri-  . HOH (hard of hearing)     wears hearing aids  . COPD (chronic obstructive pulmonary disease)   . Systolic heart failure     EF of 30 to 35% per echo in June 2014  . Lung cancer   . Legionella pneumonia   . CHF (congestive heart failure)      Family History  Problem Relation Age of Onset   . COPD Father   . Cancer Sister   . Diabetes Brother      History   Social History  . Marital Status: Married    Spouse Name: N/A    Number of Children: N/A  . Years of Education: N/A   Occupational History  . Not on file.   Social History Main Topics  . Smoking status: Former Smoker -- 1.00 packs/day for 60 years    Types: Cigarettes    Quit date: 09/27/2012  . Smokeless tobacco: Never Used  . Alcohol Use: No  . Drug Use: No  . Sexual Activity: Not Currently   Other Topics Concern  . Not on file   Social History Narrative   ** Merged History Encounter **         No Known Allergies    (Not in an outpatient encounter)     REVIEW OF SYSTEMS:  Dyspnea, back pain, fatigue. Otherwise ok  SUBJECTIVE:   VITAL SIGNS: Filed Vitals:   07/31/13 2150 07/31/13 2340 08/01/13 0547 08/01/13 0749  BP: 118/55  133/55   Pulse: 103  102   Temp: 98.1 F (36.7 C)  98.3 F (36.8 C)   TempSrc: Oral  Oral   Resp: 24  16   Height:      Weight:      SpO2: 97% 92% 96% 91%      HEMODYNAMICS:   VENTILATOR SETTINGS: Vent Mode:  [-] PRVC FiO2 (%):  [30 %] 30 % Set Rate:  [28 bmp] 28 bmp Vt Set:  [420 mL] 420 mL PEEP:  [5 cmH20] 5 cmH20 Plateau Pressure:  [4 FVO36-06 cmH20] 20 cmH20 INTAKE / OUTPUT: I/O last 3 completed shifts: In: 500 [P.O.:240; I.V.:10; IV Piggyback:250] Out: 825 [Urine:825]     PHYSICAL EXAMINATION: General:  Frail male Neuro:  AOx3. Moves all 4s HEENT:  Neck supple,, PEERL + Cardiovascular:  Normal  Hearts sounds Lungs:  LEft side reduced air entry and diminished.Normal WOB with nasal cannula on Abdomen:  Soft, Normal bowel sounds.  Musculoskeletal:  No cyanosis, No clubbing. No edema Skin:  Intact  LABS: PULMONARY No results found for this basename: PHART, PCO2, PCO2ART, PO2, PO2ART, HCO3, TCO2, O2SAT,  in the last 168 hours  CBC  Recent Labs Lab 07/31/13 1120 08/01/13 0411  HGB 11.2* 10.3*  HCT 34.5* 32.5*  WBC 7.3 6.7  PLT  269 266    COAGULATION No results found for this basename: INR,  in the last 168 hours  CARDIAC   Recent Labs Lab 07/31/13 1120  TROPONINI <0.30    Recent Labs Lab 07/31/13 1120  PROBNP 378.1*     CHEMISTRY  Recent Labs Lab 07/31/13 1120 08/01/13 0411  NA 140 137  K 4.2 4.4  CL 98 96  CO2 29 31  GLUCOSE 106* 106*  BUN 25* 24*  CREATININE 0.92 0.99  CALCIUM  10.1 9.8   Estimated Creatinine Clearance: 51.8 ml/min (by C-G formula based on Cr of 0.99).   LIVER  Recent Labs Lab 07/31/13 1120  AST 22  ALT 12  ALKPHOS 116  BILITOT 0.3  PROT 6.8  ALBUMIN 2.8*     INFECTIOUS  Recent Labs Lab 07/31/13 1120  LATICACIDVEN 1.3     ENDOCRINE CBG (last 3)  No results found for this basename: GLUCAP,  in the last 72 hours       IMAGING x48h  Ct Chest Wo Contrast  07/31/2013   CLINICAL DATA:  Worsening shortness of breath. Currently undergoing chemotherapy for lung carcinoma.  EXAM: CT CHEST WITHOUT CONTRAST  TECHNIQUE: Multidetector CT imaging of the chest was performed following the standard protocol without IV contrast.  COMPARISON:  07/14/2013  FINDINGS: Comparison with previous study is somewhat limited by lack of intravenous contrast on current exam. Large malignant left pleural effusion with thick rind of tumor throughout the left pleural space shows no significant change in size. Compressive left lower lobe atelectasis again demonstrated. Left anterior apical lung mass with adjacent pneumonitis is also similar in size. Increased consolidation is seen in the left upper lobe and lingula when compared to previous study, with near complete opacification of the left lung now demonstrated.  Severe emphysema again seen involving the right lung, with large subpleural bleb again seen in the superior right lower lobe. 8 x 9 mm pulmonary nodule in the anterior right middle lobe on image 38 is stable in size. No new or enlarging pulmonary nodules or masses are seen  in the right lung. No evidence of mediastinal or right hilar lymphadenopathy.  Visualized portions of the adrenal glands remain normal in appearance. Upper abdominal lymphadenopathy at the level of the celiac axis shows mild increase, with largest lymph node measuring 12 mm on image 51 compared to 10 mm previously. No suspicious bone lesions identified.  IMPRESSION: Increased pulmonary consolidation seen in left upper lobe and lingula.  No significant change in large malignant left pleural effusion, pleural tumor, or mass seen anterior left lung apex.  Stable 9 mm right middle lobe nodule and severe emphysema.  Slight increase in upper abdominal lymphadenopathy adjacent celiac axis.   Electronically Signed   By: Earle Gell M.D.   On: 07/31/2013 14:54   Portable Chest 1 View  08/01/2013   CLINICAL DATA:  Left hemithorax.  EXAM: PORTABLE CHEST - 1 VIEW  COMPARISON:  Chest x-ray 07/31/2013.  FINDINGS: Near complete opacification of the left hemithorax is similar to recent prior examinations, compatible with a combination of left lung consolidation, left lung atelectasis, and large left pleural effusion, better demonstrated on recent chest CT 07/31/2013. Interstitial prominence throughout the right lung is noted, superimposed upon underlying emphysematous changes. Cardiomediastinal silhouette is largely obscured.  IMPRESSION: 1. The appearance the chest is similar to recent prior examinations, again demonstrating near complete opacification of the left hemithorax, as discussed above.   Electronically Signed   By: Vinnie Langton M.D.   On: 08/01/2013 06:52   Dg Chest Port 1 View  07/31/2013   CLINICAL DATA:  Increasing shortness of breath, chest pain, lung cancer, on chemotherapy, history COPD, CHF, former smoker  EXAM: PORTABLE CHEST - 1 VIEW  COMPARISON:  CT chest 07/14/2013  FINDINGS: Subtotal opacification of the left hemi thorax by a combination of pleural effusion, increased atelectasis and tumor.  COPD  changes with right basilar atelectasis versus scarring.  No definite right lung infiltrate.  Bones  demineralized.  Cardiac margins obscured.  IMPRESSION: Marked opacification of the left hemi thorax by combination of tumor, left pleural effusion, and significant left lung atelectasis.  COPD changes with right basilar atelectasis versus scarring.   Electronically Signed   By: Lavonia Dana M.D.   On: 07/31/2013 11:24       ASSESSMENT / PLAN:  PULMONARY A: Stage 4 Metastatic lung cancer - diagnosed LUL April 2014 with brain mets  - Curently with chronic loculated Left small/moderate Lower pleural effusion but progressive LUL necrosis ? Due to cancer v infection - resulting in severe dyspnea  P:   Doubt that pleurX or thoracentesis will fix the LLL pleural efusion that is loculated - am waiting to hear back from Dr Pascal Lux of IR No specific intervention for LUL necrotic process other than empiric antibiotics per [facility]; check PCT and rule out CHF - see below Best is symptom mgmt via morphine: have started but also have called palliative care for symptom mgmt (ok per Dr Julien Nordmann)    CARDIOVASCULAR A: at risk for diastolic chf P:  Check bnp     NEUROLOGIC A:  c/o severe back pain - new. No neuro deficitis P:   MRI recommended; TRH to order Pain contril with oral morphine and later pallaitive care to titrate    D/w dr Julien Nordmann of Onc D/w Dr Lovena Neighbours of [facility]     Dr. Brand Males, M.D., [facility].C.P Pulmonary and Critical Care Medicine Staff Physician [facility] Pager: [phone], If no answer or between  15:00h - 7:00h: call [phone]  08/01/2013 8:31 AM

## 2013-08-01 NOTE — Consult Note (Signed)
Patient ID:Luis Payne      DOB: 1941/01/07      ZMO:[id number]     Consult Note from the Palliative Medicine Team at [place] Requested by: Dr Sloan Leiter     PCP: Glo Herring., MD Reason for Consultation: Clarification of [place] and options     Phone [phone]  Assessment of patients Current state: Luis Payne is a 73 y.o. male with a Past Medical History metastatic lung cancer, COPD, chronic respiratory failure on home O2 4 L/minute Admitted with progressively worsened shortness of breath,  was found to have complete opacification of his left lung on chest x-ray. Prestnly under [facility] oncology care, in reported clinical trial.  Recently received Radiation Tx under Dr Tammi Klippel for brain metastasis    This NP Wadie Lessen reviewed medical records, received report from team, assessed the patient and then meet at the patient's bedside along with his wife [name] to discuss diagnosis prognosis, [place], EOL wishes disposition and options.  A detailed discussion was had today regarding advanced directives.  Concepts specific to code status, artifical feeding and hydration, continued IV antibiotics and rehospitalization was had.  The difference between a aggressive medical intervention path  and a palliative comfort care path for this patient at this time was had.  Values and goals of care important to patient and family were attempted to be elicited.  Concept of Hospice and Palliative Care were discussed  Natural trajectory and expectations at EOL were discussed.  Questions and concerns addressed.  Hard Choices booklet left for review. Family encouraged to call with questions or concerns.  PMT will continue to support holistically.   Patient and family are hopeful for and open to treatments to prolong quality life.  Patient verbalizes he will know when the time comes to shift and focus only on comfort.     Goals of Care: 1.  Code Status:  DNR/DNI-comfort is  main focus of care   2. Scope of Treatment:  Continue present medical interventions to prolong quality life, patient will determine quality.   4. Disposition:  Hopeful for home when medcially stable and to continue treatments with Dr Earlie Server and Dr Tammi Klippel if indicated   3. Symptom Management:   1. Weakness: Continued medical management of treatable illness                           PT/OT  2. Pain: Oxycodone 15 mg every 4 hrs prn  4. Psychosocial:  Emotional support offered to patient and his wife.  They expressed appreciation for today's conversation.  The patient easily verbalizes his understanding of his overall long term poor prognosis "I know I'm dying", but remains hopeful for extended quality life.  PMT will continue to support holistically, they are encouraged to call with questions or concerns   Patient Documents Completed or Given: Document Given Completed  Advanced Directives Pkt    MOST  yes  DNR    Gone from My Sight    Hard Choices yes     Brief HPI:[name] is a 73 y.o. male with a Past Medical History metastatic lung cancer, COPD, chronic respiratory failure on home O2 4 L/minute who presents today with the above noted complaint. Per patient, he was in his usual state of health a few days ago, he then noted that he was getting much more short of breath than usual. He also complains of cough with yellow  phlegm. Denies any fever. Denies any ongoing chest pain abdominal pain. Because progressively worsened shortness of breath, patient increase his oxygen from 4 to 5 L. He subsequently presented to the emergency room, where he was found to have complete opacification of his left lung on chest x-ray. I was subsequently asked to admit this patient for further evaluation and treatment.  Patient denies any headache, fever, nausea, vomiting, diarrhea, chest pain, abdominal pain.    ROS:  Weakness, SOB on minimal exertion    PMH:  Past Medical History  Diagnosis  Date  . Substance abuse     TOBACCO  . Lung mass   . 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 Brain metastases 08/18/12    mri-  . HOH (hard of hearing)     wears hearing aids  . COPD (chronic obstructive pulmonary disease)   . Systolic heart failure     EF of 30 to 35% per echo in June 2014  . Lung cancer   . Legionella pneumonia   . CHF (congestive heart failure)      PSH: Past Surgical History  Procedure Laterality Date  . Wrist surgery    . Lung biopsy Left 08/17/12    LUL lung mass-adenocarcinoma  . Ganglion cyst excision    . Nose surgery     I have reviewed the FH and SH and  If appropriate update it with new information. No Known Allergies Scheduled Meds: . acetylcysteine  4 mL Nebulization TID  . carvedilol  3.125 mg Oral BID  . ceFEPime (MAXIPIME) IV  1 g Intravenous Q12H  . digoxin  0.125 mg Oral Daily  . enoxaparin  1 mg/kg Subcutaneous Q24H  . feeding supplement (ENSURE COMPLETE)  237 mL Oral BID BM  . folic acid  1 mg Oral Daily  . guaiFENesin  1,200 mg Oral BID  . ipratropium  0.5 mg Nebulization QID  . levalbuterol  0.63 mg Nebulization QID  . morphine CONCENTRATE  5-10 mg Oral Q6H  . predniSONE  40 mg Oral Q breakfast  . senna-docusate  2 tablet Oral BID  . sodium chloride  3 mL Intravenous Q12H  . sodium chloride  3 mL Intravenous Q12H  . vancomycin  500 mg Intravenous Q12H   Continuous Infusions:  PRN Meds:.sodium chloride, acetaminophen, acetaminophen, albuterol, alum & mag hydroxide-simeth, ondansetron (ZOFRAN) IV, ondansetron, oxyCODONE, sodium chloride    BP 99/63  Pulse 100  Temp(Src) 98.1 F (36.7 C) (Oral)  Resp 18  Ht 5\' 5"  (1.651 m)  Wt 55.883 kg (123 lb 3.2 oz)  BMI 20.50 kg/m2  SpO2 94%   PPS:30 %   Intake/Output Summary (Last 24 hours) at 08/01/13 1706 Last data filed at 08/01/13 1333  Gross per 24 hour  Intake    860 ml  Output    575 ml  Net    285 ml     Physical Exam:  General: chronically ill appearing,  NAD HEENT:  Mm, no exudate Chest:   diminished throughout L>R CVS: tachycardic Abdomen: soft NT +BS Ext: BLE trace edema Neuro: alert and oriented X3  Labs: CBC    Component Value Date/Time   WBC 6.7 08/01/2013 0411   WBC 6.7 07/19/2013 0920   RBC 3.21* 08/01/2013 0411   RBC 3.33* 07/19/2013 0920   HGB 10.3* 08/01/2013 0411   HGB 11.2* 07/19/2013 0920   HCT 32.5* 08/01/2013 0411   HCT 33.1* 07/19/2013 0920   PLT 266 08/01/2013 0411  PLT 275 07/19/2013 0920   MCV 101.2* 08/01/2013 0411   MCV 99.2* 07/19/2013 0920   MCH 32.1 08/01/2013 0411   MCH 33.5* 07/19/2013 0920   MCHC 31.7 08/01/2013 0411   MCHC 33.8 07/19/2013 0920   RDW 15.5 08/01/2013 0411   RDW 15.4* 07/19/2013 0920   LYMPHSABS 0.5* 07/31/2013 1120   LYMPHSABS 0.5* 07/19/2013 0920   MONOABS 0.6 07/31/2013 1120   MONOABS 0.7 07/19/2013 0920   EOSABS 0.0 07/31/2013 1120   EOSABS 0.0 07/19/2013 0920   BASOSABS 0.0 07/31/2013 1120   BASOSABS 0.0 07/19/2013 0920    BMET    Component Value Date/Time   NA 137 08/01/2013 0411   NA 140 07/19/2013 0921   K 4.4 08/01/2013 0411   K 4.4 07/19/2013 0921   CL 96 08/01/2013 0411   CL 94* 11/01/2012 1105   CO2 31 08/01/2013 0411   CO2 32* 07/19/2013 0921   GLUCOSE 106* 08/01/2013 0411   GLUCOSE 141* 07/19/2013 0921   GLUCOSE 124* 11/01/2012 1105   BUN 24* 08/01/2013 0411   BUN 21.8 07/19/2013 0921   CREATININE 0.99 08/01/2013 0411   CREATININE 1.0 07/19/2013 0921   CALCIUM 9.8 08/01/2013 0411   CALCIUM 10.1 07/19/2013 0921   GFRNONAA 79* 08/01/2013 0411   GFRAA >90 08/01/2013 0411    CMP     Component Value Date/Time   NA 137 08/01/2013 0411   NA 140 07/19/2013 0921   K 4.4 08/01/2013 0411   K 4.4 07/19/2013 0921   CL 96 08/01/2013 0411   CL 94* 11/01/2012 1105   CO2 31 08/01/2013 0411   CO2 32* 07/19/2013 0921   GLUCOSE 106* 08/01/2013 0411   GLUCOSE 141* 07/19/2013 0921   GLUCOSE 124* 11/01/2012 1105   BUN 24* 08/01/2013 0411   BUN 21.8 07/19/2013 0921   CREATININE 0.99 08/01/2013 0411   CREATININE  1.0 07/19/2013 0921   CALCIUM 9.8 08/01/2013 0411   CALCIUM 10.1 07/19/2013 0921   PROT 6.8 07/31/2013 1120   PROT 6.7 07/19/2013 0921   ALBUMIN 2.8* 07/31/2013 1120   ALBUMIN 2.7* 07/19/2013 0921   AST 22 07/31/2013 1120   AST 21 07/19/2013 0921   ALT 12 07/31/2013 1120   ALT 15 07/19/2013 0921   ALKPHOS 116 07/31/2013 1120   ALKPHOS 116 07/19/2013 0921   BILITOT 0.3 07/31/2013 1120   BILITOT 0.23 07/19/2013 0921   GFRNONAA 79* 08/01/2013 0411   GFRAA >90 08/01/2013 0411     Time In Time Out Total Time Spent with Patient Total Overall Time  1600 1730 80 min 90 min    Greater than 50%  of this time was spent counseling and coordinating care related to the above assessment and plan.  Wadie Lessen NP  Palliative Medicine Team Team Phone # [phone] Pager [phone]

## 2013-08-01 NOTE — Progress Notes (Signed)
Pt is unable to lay flat for mr thoracic spine. Only able to obtain mri lumbar spine with pt going in the tube reversed and head touching the top of the scanner. Pt unable to lay flat due to back pain and inability to breath while laying flat.

## 2013-08-01 NOTE — Progress Notes (Signed)
PATIENT DETAILS Name: Luis Payne Age: 73 y.o. Sex: male Date of Birth: 1941/02/03 Admit Date: 07/31/2013 Admitting Physician Luis Mutton Luis Mans, MD Luis Payne,Luis J., MD  Subjective: Still very SOB on activity-seems to be fine at rest  Assessment/Plan: Principal Problem: Complete opacification of the left lung  - Multifactorial-likely from mucous plugging, postobstructive pneumonia, worsening lung cancer, pleural effusion.  - Although requiring slightly more oxygen than usual baseline, he seems to be tolerating this rather well and is in not in any acute distress.  - Patient was admitted and started on  scheduled nebulized bronchodilators, Mucomyst nebulizer, Mucinex, empiric antibiotic therapy with Vanco/Cefepim-day 2 and chest physical therapy.Repeat CXR on 3/23 unchanged, PCCM recommending symp control with Morphine and palliative care consultation. Per PCCM no role in thoracentesis at this time.  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 Hypertension  - Continue Coreg  . History of pulmonary embolism  - Continue Lovenox. Patient has a small anterior wall hematoma-we'll continue to monitor.  Marland Kitchen History of malignant pleural effusion  - See above. May need thoracocentesis depending on clinical course.   . 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  -Oncology  to see later today  . History of COPD  - Current shortness of breath likely secondary to above conditions rather than exacerbation of COPD. Continue with chronic steroids-but have increased Prednisone to 40 mg, nebulized bronchodilators.   .History of malignant pleural effusion  - See above. Per PCCM thoracocentesis unlikely to help with symptoms  .Anemia, chronic disease  - Secondary to malignancy, continue to monitor CBC periodically   . Hypertension  - Continue Coreg   . History of pulmonary embolism  - Continue Lovenox. Patient has a small anterior wall hematoma-we'll continue to monitor.  Disposition: Remain inpatient  DVT Prophylaxis: On Lovenox   Code Status:  DNR  Family Communication Spouse  Procedures:  None  CONSULTS:  pulmonary/intensive care and hematology/oncology  Time spent 40 minutes-which includes 50% of the time with face-to-face with patient/ family and coordinating care related to the above assessment and plan.    MEDICATIONS: Scheduled Meds: . acetylcysteine  4 mL Nebulization TID  . carvedilol  3.125 mg Oral BID  . ceFEPime (MAXIPIME) IV  1 g Intravenous Q12H  . digoxin  0.125 mg Oral Daily  . enoxaparin  1 mg/kg Subcutaneous Q24H  . feeding supplement (ENSURE COMPLETE)  237 mL Oral BID BM  . folic acid  1 mg Oral Daily  . guaiFENesin  1,200 mg Oral BID  . ipratropium  0.5 mg Nebulization QID  . levalbuterol  0.63 mg Nebulization QID  . morphine CONCENTRATE  5 mg Oral Q6H  . predniSONE  40 mg Oral Q breakfast  . senna-docusate  2 tablet Oral BID  . sodium chloride  3 mL Intravenous Q12H  . sodium chloride  3 mL Intravenous Q12H  . vancomycin  500 mg Intravenous  Q12H   Continuous Infusions:  PRN Meds:.sodium chloride, acetaminophen, acetaminophen, albuterol, alum & mag hydroxide-simeth, ondansetron (ZOFRAN) IV, ondansetron, oxyCODONE, sodium chloride  Antibiotics: Anti-infectives   Start     Dose/Rate Route Frequency Ordered Stop     08/01/13 0600  vancomycin (VANCOCIN) 500 mg in sodium chloride 0.9 % 100 mL IVPB     500 mg 100 mL/hr over 60 Minutes Intravenous Every 12 hours 07/31/13 1552     07/31/13 1600  vancomycin (VANCOCIN) IVPB 1000 mg/200 mL premix     1,000 mg 200 mL/hr over 60 Minutes Intravenous NOW 07/31/13 1552 07/31/13 1838   07/31/13 1600  ceFEPIme (MAXIPIME) 1 g in dextrose 5 % 50 mL IVPB     1 g 100 mL/hr over 30 Minutes Intravenous Every 12 hours 07/31/13 1552         PHYSICAL EXAM: Vital signs in last 24 hours: Filed Vitals:   07/31/13 2340 08/01/13 0547 08/01/13 0749 08/01/13 0848  BP:  133/55  111/56  Pulse:  102  102  Temp:  98.3 F (36.8 C)    TempSrc:  Oral    Resp:  16    Height:      Weight:      SpO2: 92% 96% 91%     Weight change:  Filed Weights   07/31/13 1500  Weight: 55.883 kg (123 lb 3.2 oz)   Body mass index is 20.5 kg/(m^2).   Gen Exam: Awake and alert with clear speech.   Neck: Supple, No JVD.   Chest: Some air entry in left upper lung, right lung clear to auscultation CVS: S1 S2 Regular, no murmurs.  Abdomen: soft, BS +, non tender, non distended.  Extremities: no edema, lower extremities warm to touch. Neurologic: Non Focal.   Skin: No Rash.   Wounds: N/A.   Intake/Output from previous day:  Intake/Output Summary (Last 24 hours) at 08/01/13 0959 Last data filed at 08/01/13 0800  Gross per 24 hour  Intake    500 ml  Output    950 ml  Net   -450 ml     LAB RESULTS: CBC  Recent Labs Lab 07/31/13 1120 08/01/13 0411  WBC 7.3 6.7  HGB 11.2* 10.3*  HCT 34.5* 32.5*  PLT 269 266  MCV 100.9* 101.2*  MCH 32.7 32.1  MCHC 32.5 31.7  RDW 15.4 15.5  LYMPHSABS 0.5*  --   MONOABS 0.6  --   EOSABS 0.0  --   BASOSABS 0.0  --     Chemistries   Recent Labs Lab 07/31/13 1120 08/01/13 0411  NA 140 137  K 4.2 4.4  CL 98 96  CO2 29 31  GLUCOSE 106* 106*  BUN 25* 24*  CREATININE 0.92 0.99  CALCIUM 10.1 9.8    CBG: No results found for this  basename: GLUCAP,  in the last 168 hours  GFR Estimated Creatinine Clearance: 51.8 ml/min (by C-G formula based on Cr of 0.99).  Coagulation profile No results found for this basename: INR, PROTIME,  in the last 168 hours  Cardiac Enzymes  Recent Labs Lab 07/31/13 1120  TROPONINI <0.30    No components found with this basename: POCBNP,  No results found for this basename: DDIMER,  in the last 72 hours No results found for this basename: HGBA1C,  in the last 72 hours No results found for this basename: CHOL, HDL, LDLCALC, TRIG, CHOLHDL, LDLDIRECT,  in the last 72 hours No results found for this basename: TSH, T4TOTAL, FREET3,  T3FREE, THYROIDAB,  in the last 72 hours No results found for this basename: VITAMINB12, FOLATE, FERRITIN, TIBC, IRON, RETICCTPCT,  in the last 72 hours No results found for this basename: LIPASE, AMYLASE,  in the last 72 hours  Urine Studies No results found for this basename: UACOL, UAPR, USPG, UPH, UTP, UGL, UKET, UBIL, UHGB, UNIT, UROB, ULEU, UEPI, UWBC, URBC, UBAC, CAST, CRYS, UCOM, BILUA,  in the last 72 hours  MICROBIOLOGY: No results found for this or any previous visit (from the past 240 hour(s)).  RADIOLOGY STUDIES/RESULTS: Ct Chest Wo Contrast  07/31/2013   CLINICAL DATA:  Worsening shortness of breath. Currently undergoing chemotherapy for lung carcinoma.  EXAM: CT CHEST WITHOUT CONTRAST  TECHNIQUE: Multidetector CT imaging of the chest was performed following the standard protocol without IV contrast.  COMPARISON:  07/14/2013  FINDINGS: Comparison with previous study is somewhat limited by lack of intravenous contrast on current exam. Large malignant left pleural effusion with thick rind of tumor throughout the left pleural space shows no significant change in size. Compressive left lower lobe atelectasis again demonstrated. Left anterior apical lung mass with adjacent pneumonitis is also similar in size. Increased consolidation is seen in the left  upper lobe and lingula when compared to previous study, with near complete opacification of the left lung now demonstrated.  Severe emphysema again seen involving the right lung, with large subpleural bleb again seen in the superior right lower lobe. 8 x 9 mm pulmonary nodule in the anterior right middle lobe on image 38 is stable in size. No new or enlarging pulmonary nodules or masses are seen in the right lung. No evidence of mediastinal or right hilar lymphadenopathy.  Visualized portions of the adrenal glands remain normal in appearance. Upper abdominal lymphadenopathy at the level of the celiac axis shows mild increase, with largest lymph node measuring 12 mm on image 51 compared to 10 mm previously. No suspicious bone lesions identified.  IMPRESSION: Increased pulmonary consolidation seen in left upper lobe and lingula.  No significant change in large malignant left pleural effusion, pleural tumor, or mass seen anterior left lung apex.  Stable 9 mm right middle lobe nodule and severe emphysema.  Slight increase in upper abdominal lymphadenopathy adjacent celiac axis.   Electronically Signed   By: Earle Gell M.D.   On: 07/31/2013 14:54   Ct Chest W Contrast  07/14/2013   CLINICAL DATA:  Metastatic lung cancer.  Recist 1.1.  EXAM: CT CHEST, ABDOMEN, AND PELVIS WITH CONTRAST RECIST RESEARCH SCORING  TECHNIQUE: Multidetector CT imaging of the chest, abdomen and pelvis was performed following the standard protocol during bolus administration of intravenous contrast.  CONTRAST:  171mL OMNIPAQUE IOHEXOL 300 MG/ML  SOLN  COMPARISON:  CT ABD/PELVIS W CM dated 06/15/2013; CT ANGIO CHEST W/CM &/OR WO/CM dated 05/02/2013; CT CHEST W/CM dated 04/18/2013; DG CHEST 2 VIEW dated 06/18/2013  FINDINGS: RECIST 1.1  Target Lesions:  1. Left anterior lung apex mass, 3.4 cm in long axis on image 10 of series 2 (formerly 3.1 cm). 2. Left anterior pleural/epicardial metastatic focus, 2.9 cm in the long axis on image 42 of series 2  (formerly 2.9 cm). 3. Left periaortic lymph node, 1.4 cm in short axis on image 57 of series 2 (formerly 1.3 cm). Non-target Lesions:  1. Right middle lobe pulmonary nodule, 9 mm in long axis on image 36 of series 5 (formerly 7 mm). ---------------------  CT CHEST FINDINGS  Malignant left pleural effusion with thick multilobular rind of  tumor subjectively stable from prior exam. Left anterior apical mass has similar morphology to the prior exam. There is considerable consolidation in the left lung, as before, with less than 1/3 of the left lung aerated. The amount of consolidation is mildly increased posteriorly. Epicardial extension of tumor in finger-like projections, similar to prior No overt rib destruction, although some of the lobulations of pleural tumor extend into the intercostal space likely with some degree of chest wall invasion as suggested on image 39 of series 2.  There is a small amount of pericardial fluid above the cardiac apex, with the medially adjacent epicardial tumor deposits.  Severe emphysema noted. 9 x 8 mm right middle lobe pulmonary nodule, mildly increased in size compared to prior.  CT ABDOMEN AND PELVIS FINDINGS  Diffuse hepatic steatosis. Gastrohepatic ligament lymph node, 1.0 cm in diameter, formerly 0.4 cm. Left retroperitoneal periaortic lymph node, 1.4 cm in short axis, formerly 1.3 cm. No adrenal mass observed. No other adenopathy identified.  No specific gallbladder or biliary abnormality identified. Stable bilateral nonobstructive nephrolithiasis. Aortoiliac atherosclerosis. Prominent stool throughout the colon favors constipation. Bilateral inguinal hernias contain loops of small bowel without findings of strangulation or obstruction. Small umbilical hernia contains adipose tissue.  Mild asymmetry of the seminal vesicles, right larger than left, but without a definite prostate mass.  Bilaterally symmetric fusion of the sacroiliac joints. Interspinous ligament calcification  with syndesmotic fight ankylosis in the spine.  IMPRESSION: 1. Minimal advancement of metastatic burden compared to prior exam. Suspected early chest wall invasion on the left. 2. Severe emphysema. 3. 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 4. Hepatic steatosis. 5.  Prominent stool throughout the colon favors constipation. 6. Bilateral inguinal hernias contain small bowel without strangulation or obstruction.   Electronically Signed   By: Sherryl Barters M.D.   On: 07/14/2013 16:50   Ct Abdomen Pelvis W Contrast  07/14/2013   CLINICAL DATA:  Metastatic lung cancer.  Recist 1.1.  EXAM: CT CHEST, ABDOMEN, AND PELVIS WITH CONTRAST RECIST RESEARCH SCORING  TECHNIQUE: Multidetector CT imaging of the chest, abdomen and pelvis was performed following the standard protocol during bolus administration of intravenous contrast.  CONTRAST:  17mL OMNIPAQUE IOHEXOL 300 MG/ML  SOLN  COMPARISON:  CT ABD/PELVIS W CM dated 06/15/2013; CT ANGIO CHEST W/CM &/OR WO/CM dated 05/02/2013; CT CHEST W/CM dated 04/18/2013; DG CHEST 2 VIEW dated 06/18/2013  FINDINGS: RECIST 1.1  Target Lesions:  1. Left anterior lung apex mass, 3.4 cm in long axis on image 10 of series 2 (formerly 3.1 cm). 2. Left anterior pleural/epicardial metastatic focus, 2.9 cm in the long axis on image 42 of series 2 (formerly 2.9 cm). 3. Left periaortic lymph node, 1.4 cm in short axis on image 57 of series 2 (formerly 1.3 cm). Non-target Lesions:  1. Right middle lobe pulmonary nodule, 9 mm in long axis on image 36 of series 5 (formerly 7 mm). ---------------------  CT CHEST FINDINGS  Malignant left pleural effusion with thick multilobular rind of tumor subjectively stable from prior exam. Left anterior apical mass has similar morphology to the prior exam. There is considerable consolidation in the left lung, as before, with less than 1/3 of the left lung aerated. The amount of consolidation is mildly increased posteriorly.  Epicardial extension of tumor in finger-like projections, similar to prior No overt rib destruction, although some of the lobulations of pleural tumor extend into the intercostal space likely with some degree of chest wall invasion as  suggested on image 39 of series 2.  There is a small amount of pericardial fluid above the cardiac apex, with the medially adjacent epicardial tumor deposits.  Severe emphysema noted. 9 x 8 mm right middle lobe pulmonary nodule, mildly increased in size compared to prior.  CT ABDOMEN AND PELVIS FINDINGS  Diffuse hepatic steatosis. Gastrohepatic ligament lymph node, 1.0 cm in diameter, formerly 0.4 cm. Left retroperitoneal periaortic lymph node, 1.4 cm in short axis, formerly 1.3 cm. No adrenal mass observed. No other adenopathy identified.  No specific gallbladder or biliary abnormality identified. Stable bilateral nonobstructive nephrolithiasis. Aortoiliac atherosclerosis. Prominent stool throughout the colon favors constipation. Bilateral inguinal hernias contain loops of small bowel without findings of strangulation or obstruction. Small umbilical hernia contains adipose tissue.  Mild asymmetry of the seminal vesicles, right larger than left, but without a definite prostate mass.  Bilaterally symmetric fusion of the sacroiliac joints. Interspinous ligament calcification with syndesmotic fight ankylosis in the spine.  IMPRESSION: 1. Minimal advancement of metastatic burden compared to prior exam. Suspected early chest wall invasion on the left. 2. Severe emphysema. 3. 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 4. Hepatic steatosis. 5.  Prominent stool throughout the colon favors constipation. 6. Bilateral inguinal hernias contain small bowel without strangulation or obstruction.   Electronically Signed   By: Sherryl Barters M.D.   On: 07/14/2013 16:50   Portable Chest 1 View  08/01/2013   CLINICAL DATA:  Left hemithorax.  EXAM: PORTABLE  CHEST - 1 VIEW  COMPARISON:  Chest x-ray 07/31/2013.  FINDINGS: Near complete opacification of the left hemithorax is similar to recent prior examinations, compatible with a combination of left lung consolidation, left lung atelectasis, and large left pleural effusion, better demonstrated on recent chest CT 07/31/2013. Interstitial prominence throughout the right lung is noted, superimposed upon underlying emphysematous changes. Cardiomediastinal silhouette is largely obscured.  IMPRESSION: 1. The appearance the chest is similar to recent prior examinations, again demonstrating near complete opacification of the left hemithorax, as discussed above.   Electronically Signed   By: Vinnie Langton M.D.   On: 08/01/2013 06:52   Dg Chest Port 1 View  07/31/2013   CLINICAL DATA:  Increasing shortness of breath, chest pain, lung cancer, on chemotherapy, history COPD, CHF, former smoker  EXAM: PORTABLE CHEST - 1 VIEW  COMPARISON:  CT chest 07/14/2013  FINDINGS: Subtotal opacification of the left hemi thorax by a combination of pleural effusion, increased atelectasis and tumor.  COPD changes with right basilar atelectasis versus scarring.  No definite right lung infiltrate.  Bones demineralized.  Cardiac margins obscured.  IMPRESSION: Marked opacification of the left hemi thorax by combination of tumor, left pleural effusion, and significant left lung atelectasis.  COPD changes with right basilar atelectasis versus scarring.   Electronically Signed   By: Lavonia Dana M.D.   On: 07/31/2013 11:24    Oren Binet, MD  [facility] Pager:[phone]  If 7PM-7AM, please contact night-coverage [email or web address] Password [email or web address] 08/01/2013, 9:59 AM   LOS: 1 day

## 2013-08-02 ENCOUNTER — Ambulatory Visit: Payer: Self-pay | Admitting: Physician Assistant

## 2013-08-02 ENCOUNTER — Inpatient Hospital Stay (HOSPITAL_COMMUNITY): Payer: Medicare Other

## 2013-08-02 ENCOUNTER — Ambulatory Visit: Payer: Self-pay

## 2013-08-02 ENCOUNTER — Other Ambulatory Visit: Payer: Self-pay

## 2013-08-02 ENCOUNTER — Other Ambulatory Visit: Payer: Self-pay | Admitting: *Deleted

## 2013-08-02 DIAGNOSIS — Z515 Encounter for palliative care: Secondary | ICD-10-CM

## 2013-08-02 DIAGNOSIS — R531 Weakness: Secondary | ICD-10-CM

## 2013-08-02 DIAGNOSIS — J189 Pneumonia, unspecified organism: Secondary | ICD-10-CM

## 2013-08-02 LAB — VANCOMYCIN, TROUGH: Vancomycin Tr: 15 ug/mL (ref 10.0–20.0)

## 2013-08-02 LAB — GLUCOSE, SEROUS FLUID: GLUCOSE FL: 69 mg/dL

## 2013-08-02 MED ORDER — LEVALBUTEROL HCL 0.63 MG/3ML IN NEBU
0.6300 mg | INHALATION_SOLUTION | Freq: Four times a day (QID) | RESPIRATORY_TRACT | Status: DC
  Administered 2013-08-02 – 2013-08-04 (×8): 0.63 mg via RESPIRATORY_TRACT
  Filled 2013-08-02 (×17): qty 3

## 2013-08-02 MED ORDER — BISACODYL 10 MG RE SUPP
10.0000 mg | Freq: Once | RECTAL | Status: AC
  Administered 2013-08-02: 10 mg via RECTAL
  Filled 2013-08-02 (×2): qty 1

## 2013-08-02 MED ORDER — FUROSEMIDE 10 MG/ML IJ SOLN
20.0000 mg | Freq: Once | INTRAMUSCULAR | Status: AC
  Administered 2013-08-02: 20 mg via INTRAVENOUS
  Filled 2013-08-02: qty 2

## 2013-08-02 MED ORDER — IPRATROPIUM BROMIDE 0.02 % IN SOLN
0.5000 mg | Freq: Four times a day (QID) | RESPIRATORY_TRACT | Status: DC
  Administered 2013-08-02 – 2013-08-04 (×8): 0.5 mg via RESPIRATORY_TRACT
  Filled 2013-08-02 (×9): qty 2.5

## 2013-08-02 MED ORDER — METHYLPREDNISOLONE SODIUM SUCC 40 MG IJ SOLR
40.0000 mg | Freq: Two times a day (BID) | INTRAMUSCULAR | Status: DC
  Administered 2013-08-02 – 2013-08-03 (×4): 40 mg via INTRAVENOUS
  Filled 2013-08-02 (×5): qty 1

## 2013-08-02 MED ORDER — FLEET ENEMA 7-19 GM/118ML RE ENEM: 1.0000 | ENEMA | Freq: Every day | RECTAL | Status: DC | PRN

## 2013-08-02 MED ORDER — POLYETHYLENE GLYCOL 3350 17 G PO PACK
17.0000 g | PACK | Freq: Two times a day (BID) | ORAL | Status: DC
Start: 2013-08-02 — End: 2013-08-04
  Administered 2013-08-02 – 2013-08-04 (×4): 17 g via ORAL
  Filled 2013-08-02 (×6): qty 1

## 2013-08-02 MED ORDER — SENNOSIDES-DOCUSATE SODIUM 8.6-50 MG PO TABS
1.0000 | ORAL_TABLET | Freq: Two times a day (BID) | ORAL | Status: DC
  Administered 2013-08-02 – 2013-08-04 (×3): 1 via ORAL
  Filled 2013-08-02 (×5): qty 1

## 2013-08-02 MED ORDER — IPRATROPIUM BROMIDE 0.02 % IN SOLN: 0.5000 mg | Freq: Four times a day (QID) | RESPIRATORY_TRACT | Status: DC

## 2013-08-02 NOTE — Progress Notes (Signed)
ANTIBIOTIC CONSULT NOTE - Follow up  Pharmacy Consult for Vancomycin, Cefepime Indication: pneumonia  No Known Allergies  Patient Measurements: Height: 5\' 5"  (165.1 cm) Weight: 123 lb 3.2 oz (55.883 kg) IBW/kg (Calculated) : 61.5  Vital Signs: Temp: 98 F (36.7 C) (03/24 0612) Temp src: Oral (03/24 0612) BP: 114/55 mmHg (03/24 0612) Pulse Rate: 102 (03/24 0612)  Labs:  Recent Labs  07/31/13 1120 08/01/13 0411  WBC 7.3 6.7  HGB 11.2* 10.3*  PLT 269 266  CREATININE 0.92 0.99   Estimated Creatinine Clearance: 51.8 ml/min (by C-G formula based on Cr of 0.99).  Microbiology: No results found for this or any previous visit (from the past 720 hour(s)).  Assessment: 64 yoM admitted 3/22 with worsening SOB over the past 2 days.  PMH significant for metastatic lung cancer, COPD, home O2 and PE.  Pharmacy is consulted to dose cefepime and vancomycin for r/o pneumonia, HCAP.  3/22 >> Vanc >> 3/22 >> Cefepime >>   Tmax: AF WBCs: wnl (steroids) Renal: wnl, 52CG, 66N  No cultures  Goal of Therapy:  Vancomycin trough level 15-20 mcg/ml  Plan:   Cont Cefepime 1g IV q12h.  Cont Vanc 500mg  IV q12h.  Measure Vanc trough today at 1730.  Follow up renal fxn and any culture results.  Romeo Rabon, PharmD, pager [phone]. 08/02/2013,12:08 PM.

## 2013-08-02 NOTE — Progress Notes (Signed)
RT Note: Patient refused chest vest. Patient and wife stated that he had just eaten and did not want to do the therapy. I talked with the family about using a Flutter Valve and the patient wants to try that. Rt will bring that back this afternoon and instruct patient on its use. Rt will continue to monitor.

## 2013-08-02 NOTE — Progress Notes (Signed)
PHARMACY - VANCOMYCIN  Patient on Vancomycin 500mg  IV q12h for pneumonia.  Full note written earlier today.  Vancomycin trough = 15 mcg/ml (goal 15-20)  PLAN:  Continue Vancomycin 500mg  IV q12h.  Leone Haven, PharmD 08/02/13 @ 18:52

## 2013-08-02 NOTE — Progress Notes (Signed)
PULMONARY  / CRITICAL CARE MEDICINE  Name: Luis Payne MRN: [id number] DOB: 11/28/1940 PCP Glo Herring., MD    ADMISSION DATE:  07/31/2013  LOS 2 days   CONSULTATION DATE:  08/01/13  REFERRING MD :  [facility] PRIMARY SERVICE: [facility]  CHIEF COMPLAINT:  Dyspnea  BRIEF PATIENT DESCRIPTION: see HPI  SIGNIFICANT EVENTS / STUDIES:  07/31/2013 - admit   LINES / TUBES: none  CULTURES: none  ANTIBIOTICS: Anti-infectives   Start     Dose/Rate Route Frequency Ordered Stop   08/01/13 0600  vancomycin (VANCOCIN) 500 mg in sodium chloride 0.9 % 100 mL IVPB     500 mg 100 mL/hr over 60 Minutes Intravenous Every 12 hours 07/31/13 1552     07/31/13 1600  vancomycin (VANCOCIN) IVPB 1000 mg/200 mL premix     1,000 mg 200 mL/hr over 60 Minutes Intravenous NOW 07/31/13 1552 07/31/13 1838   07/31/13 1600  ceFEPIme (MAXIPIME) 1 g in dextrose 5 % 50 mL IVPB     1 g 100 mL/hr over 30 Minutes Intravenous Every 12 hours 07/31/13 1552        SUBJECTIVE:   Pain better with oral morphine I prescribed. Dyspnea still present: He is open to a trial of therapeutic thora on left side. Also c/o cough.  VITAL SIGNS: Filed Vitals:   08/01/13 1944 08/01/13 2031 08/02/13 0612 08/02/13 0845  BP:  112/58 114/55   Pulse:   102   Temp:  98 F (36.7 C) 98 F (36.7 C)   TempSrc:  Oral Oral   Resp:      Height:      Weight:      SpO2: 93% 93% 98% 96%        VENTILATOR SETTINGS: Vent Mode:  [-] PRVC FiO2 (%):  [30 %] 30 % Set Rate:  [28 bmp] 28 bmp Vt Set:  [420 mL] 420 mL PEEP:  [5 cmH20] 5 cmH20 Plateau Pressure:  [4 TTS17-79 cmH20] 20 cmH20 INTAKE / OUTPUT: I/O last 3 completed shifts: In: 53 [P.O.:660; IV Piggyback:300] Out: 1425 [Urine:1425]     PHYSICAL EXAMINATION: General:  Frail male Neuro:  AOx3. Moves all 4s HEENT:  Neck supple,, PEERL + Cardiovascular:  Normal  Hearts sounds Lungs:  LEft side reduced air entry and diminished.Normal WOB with nasal cannula  on Abdomen:  Soft, Normal bowel sounds.  Musculoskeletal:  No cyanosis, No clubbing. No edema Skin:  Intact  LABS: PULMONARY No results found for this basename: PHART, PCO2, PCO2ART, PO2, PO2ART, HCO3, TCO2, O2SAT,  in the last 168 hours  CBC  Recent Labs Lab 07/31/13 1120 08/01/13 0411  HGB 11.2* 10.3*  HCT 34.5* 32.5*  WBC 7.3 6.7  PLT 269 266    COAGULATION No results found for this basename: INR,  in the last 168 hours  CARDIAC    Recent Labs Lab 07/31/13 1120  TROPONINI <0.30    Recent Labs Lab 07/31/13 1120 08/01/13 0411  PROBNP 378.1* 422.0*     CHEMISTRY  Recent Labs Lab 07/31/13 1120 08/01/13 0411  NA 140 137  K 4.2 4.4  CL 98 96  CO2 29 31  GLUCOSE 106* 106*  BUN 25* 24*  CREATININE 0.92 0.99  CALCIUM 10.1 9.8   Estimated Creatinine Clearance: 51.8 ml/min (by C-G formula based on Cr of 0.99).   LIVER  Recent Labs Lab 07/31/13 1120  AST 22  ALT 12  ALKPHOS 116  BILITOT 0.3  PROT 6.8  ALBUMIN 2.8*  INFECTIOUS  Recent Labs Lab 07/31/13 1120 08/01/13 0411  LATICACIDVEN 1.3  --   PROCALCITON  --  0.21     ENDOCRINE CBG (last 3)  No results found for this basename: GLUCAP,  in the last 72 hours       IMAGING x48h  Ct Chest Wo Contrast  07/31/2013   CLINICAL DATA:  Worsening shortness of breath. Currently undergoing chemotherapy for lung carcinoma.  EXAM: CT CHEST WITHOUT CONTRAST  TECHNIQUE: Multidetector CT imaging of the chest was performed following the standard protocol without IV contrast.  COMPARISON:  07/14/2013  FINDINGS: Comparison with previous study is somewhat limited by lack of intravenous contrast on current exam. Large malignant left pleural effusion with thick rind of tumor throughout the left pleural space shows no significant change in size. Compressive left lower lobe atelectasis again demonstrated. Left anterior apical lung mass with adjacent pneumonitis is also similar in size. Increased  consolidation is seen in the left upper lobe and lingula when compared to previous study, with near complete opacification of the left lung now demonstrated.  Severe emphysema again seen involving the right lung, with large subpleural bleb again seen in the superior right lower lobe. 8 x 9 mm pulmonary nodule in the anterior right middle lobe on image 38 is stable in size. No new or enlarging pulmonary nodules or masses are seen in the right lung. No evidence of mediastinal or right hilar lymphadenopathy.  Visualized portions of the adrenal glands remain normal in appearance. Upper abdominal lymphadenopathy at the level of the celiac axis shows mild increase, with largest lymph node measuring 12 mm on image 51 compared to 10 mm previously. No suspicious bone lesions identified.  IMPRESSION: Increased pulmonary consolidation seen in left upper lobe and lingula.  No significant change in large malignant left pleural effusion, pleural tumor, or mass seen anterior left lung apex.  Stable 9 mm right middle lobe nodule and severe emphysema.  Slight increase in upper abdominal lymphadenopathy adjacent celiac axis.   Electronically Signed   By: Earle Gell M.D.   On: 07/31/2013 14:54   Mr Lumbar Spine W Wo Contrast  08/01/2013   CLINICAL DATA:  Stage IV lung cancer.  Possible bone metastases.  EXAM: MRI LUMBAR SPINE WITHOUT AND WITH CONTRAST  TECHNIQUE: Multiplanar and multiecho pulse sequences of the lumbar spine were obtained without and with intravenous contrast.  CONTRAST:  45mL MULTIHANCE GADOBENATE DIMEGLUMINE 529 MG/ML IV SOLN  COMPARISON:  CT ABD/PELVIS W CM dated 07/14/2013; NM PET IMAGE INITIAL (PI) SKULL BASE TO THIGH dated 08/02/2012  FINDINGS: Five lumbar type vertebral bodies are assumed. There is a mild convex right scoliosis. The lateral alignment is normal. There is no evidence of acute fracture, pars defect or osseous metastatic disease. 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   The conus medullaris extends to the L1 level and appears normal. There is no abnormal intradural enhancement. Extensive enhancing pleural and extrapleural tumor in the lower left hemithorax is again noted. Left periaortic retroperitoneal lymphadenopathy appears grossly stable, measuring up to 1.5 cm short axis on axial image 14.  The lumbar discs are fairly well hydrated. There is mild disc bulging and facet hypertrophy. No disc herniation, spinal stenosis or nerve root encroachment is demonstrated.  IMPRESSION: 1. No evidence of metastatic disease to the spine or its intradural contents. No evidence of fracture or significant malalignment. 2. 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 3. Extensive  enhancing pleural/extrapleural tumor within the left hemithorax with grossly stable retroperitoneal lymphadenopathy. 4. Minimal spondylosis for age. No disc herniation, spinal stenosis or nerve root encroachment.   Electronically Signed   By: Camie Patience M.D.   On: 08/01/2013 15:01   Portable Chest 1 View  08/01/2013   CLINICAL DATA:  Left hemithorax.  EXAM: PORTABLE CHEST - 1 VIEW  COMPARISON:  Chest x-ray 07/31/2013.  FINDINGS: Near complete opacification of the left hemithorax is similar to recent prior examinations, compatible with a combination of left lung consolidation, left lung atelectasis, and large left pleural effusion, better demonstrated on recent chest CT 07/31/2013. Interstitial prominence throughout the right lung is noted, superimposed upon underlying emphysematous changes. Cardiomediastinal silhouette is largely obscured.  IMPRESSION: 1. The appearance the chest is similar to recent prior examinations, again demonstrating near complete opacification of the left hemithorax, as discussed above.   Electronically Signed   By: Vinnie Langton M.D.   On: 08/01/2013 06:52   Dg Chest Port 1 View  07/31/2013   CLINICAL DATA:  Increasing shortness  of breath, chest pain, lung cancer, on chemotherapy, history COPD, CHF, former smoker  EXAM: PORTABLE CHEST - 1 VIEW  COMPARISON:  CT chest 07/14/2013  FINDINGS: Subtotal opacification of the left hemi thorax by a combination of pleural effusion, increased atelectasis and tumor.  COPD changes with right basilar atelectasis versus scarring.  No definite right lung infiltrate.  Bones demineralized.  Cardiac margins obscured.  IMPRESSION: Marked opacification of the left hemi thorax by combination of tumor, left pleural effusion, and significant left lung atelectasis.  COPD changes with right basilar atelectasis versus scarring.   Electronically Signed   By: Lavonia Dana M.D.   On: 07/31/2013 11:24       ASSESSMENT / PLAN:  PULMONARY A: Stage 4 Metastatic lung cancer - diagnosed LUL April 2014 with brain mets Acue on chronic resp failure  - Curently with chronic loculated Left small/moderate Lower pleural effusion but progressive LUL necrosis ? Due to cancer v infection; slight high PCT suggess localized infection - resulting in severe dyspnea  P:   D/w Dr Markus Daft of IR: we all agree that pleurX might not be beneficial right now wesp with loculated appearance but given his dyspnea worth trying a theraputic thora I(will check fluid cyt0logy, LDH and glucose) -No specific intervention for LUL necrotic process other than empiric antibiotics per [facility]; would give 8 day course due to sloght high PCT - can try diuresis; defer to [facility] -Best is symptom mgmt via morphine: have started but also have called palliative care for symptom mgmt (ok per Dr Julien Nordmann)    CARDIOVASCULAR A: at risk for diastolic chf, mildly elevated P:  Lasis per [facility]    NEUROLOGIC A:  c/o severe back pain - new. No neuro deficitis. MRI negative P:   Pain contril with oral morphine and later pallaitive care to titrate      Dr. Brand Males, M.D., [facility].C.P Pulmonary and Critical Care Medicine Staff Physician [facility] Pager: [phone], If no answer or between  15:00h - 7:00h: call [phone]  08/02/2013 9:09 AM

## 2013-08-02 NOTE — Progress Notes (Signed)
Pt refusing chest vest at this time. Pt states he does "not want to be woken up for it." RT will continue to monitor.

## 2013-08-02 NOTE — Procedures (Signed)
US guided therapeutic left thoracentesis performed yielding 350 cc's blood-tinged fluid. [place] post left chest revealed only small loculated pocket of fluid in upper portion. F/u CXR pending. No immediate complications.

## 2013-08-02 NOTE — Consult Note (Signed)
I have reviewed this case with our NP and agree with the Assessment and Plan as stated.  Kahealani Yankovich L. Hong Moring, MD MBA The Palliative Medicine Team at [place] Team Phone: [phone] Pager: [phone]   

## 2013-08-02 NOTE — Progress Notes (Signed)
Progress Note from the Palliative Medicine Team at [place]:  -patient is "wiped out", just back from thoracentesis, wife and son at bedside   -continued conversation regarding his plan on discharge and options  -they verbalize no desire for hospice services at this time, however patient is clearly eligible     Objective: No Known Allergies Scheduled Meds: . bisacodyl  10 mg Rectal Once  . carvedilol  3.125 mg Oral BID  . ceFEPime (MAXIPIME) IV  1 g Intravenous Q12H  . digoxin  0.125 mg Oral Daily  . feeding supplement (ENSURE COMPLETE)  237 mL Oral BID BM  . folic acid  1 mg Oral Daily  . furosemide  20 mg Intravenous Once  . guaiFENesin  1,200 mg Oral BID  . ipratropium  0.5 mg Nebulization Q6H  . levalbuterol  0.63 mg Nebulization Q6H  . methylPREDNISolone (SOLU-MEDROL) injection  40 mg Intravenous Q12H  . morphine CONCENTRATE  5-10 mg Oral Q6H  . polyethylene glycol  17 g Oral BID  . senna-docusate  1 tablet Oral BID  . senna-docusate  2 tablet Oral BID  . sodium chloride  3 mL Intravenous Q12H  . sodium chloride  3 mL Intravenous Q12H  . vancomycin  500 mg Intravenous Q12H   Continuous Infusions:  PRN Meds:.sodium chloride, acetaminophen, acetaminophen, albuterol, alum & mag hydroxide-simeth, ondansetron (ZOFRAN) IV, ondansetron, oxyCODONE, sodium chloride, sodium phosphate  BP 121/57  Pulse 101  Temp(Src) 97.6 F (36.4 C) (Oral)  Resp 24  Ht 5\' 5"  (1.651 m)  Wt 55.883 kg (123 lb 3.2 oz)  BMI 20.50 kg/m2  SpO2 96%   PPS: 40 % at best     Intake/Output Summary (Last 24 hours) at 08/02/13 1701 Last data filed at 08/02/13 1300  Gross per 24 hour  Intake    850 ml  Output   1100 ml  Net   -250 ml      Stool Softner:Senna s prn qhs  Physical Exam: General: chronically ill appearing, NAD  HEENT: Mm, no exudate  Chest: diminished throughout L>R  CVS: tachycardic  Abdomen: soft NT +BS  Ext: BLE trace edema  Neuro: alert and oriented X3       Labs: CBC    Component Value Date/Time   WBC 6.7 08/01/2013 0411   WBC 6.7 07/19/2013 0920   RBC 3.21* 08/01/2013 0411   RBC 3.33* 07/19/2013 0920   HGB 10.3* 08/01/2013 0411   HGB 11.2* 07/19/2013 0920   HCT 32.5* 08/01/2013 0411   HCT 33.1* 07/19/2013 0920   PLT 266 08/01/2013 0411   PLT 275 07/19/2013 0920   MCV 101.2* 08/01/2013 0411   MCV 99.2* 07/19/2013 0920   MCH 32.1 08/01/2013 0411   MCH 33.5* 07/19/2013 0920   MCHC 31.7 08/01/2013 0411   MCHC 33.8 07/19/2013 0920   RDW 15.5 08/01/2013 0411   RDW 15.4* 07/19/2013 0920   LYMPHSABS 0.5* 07/31/2013 1120   LYMPHSABS 0.5* 07/19/2013 0920   MONOABS 0.6 07/31/2013 1120   MONOABS 0.7 07/19/2013 0920   EOSABS 0.0 07/31/2013 1120   EOSABS 0.0 07/19/2013 0920   BASOSABS 0.0 07/31/2013 1120   BASOSABS 0.0 07/19/2013 0920    BMET    Component Value Date/Time   NA 137 08/01/2013 0411   NA 140 07/19/2013 0921   K 4.4 08/01/2013 0411   K 4.4 07/19/2013 0921   CL 96 08/01/2013 0411   CL 94* 11/01/2012 1105   CO2 31 08/01/2013 0411  CO2 32* 07/19/2013 0921   GLUCOSE 106* 08/01/2013 0411   GLUCOSE 141* 07/19/2013 0921   GLUCOSE 124* 11/01/2012 1105   BUN 24* 08/01/2013 0411   BUN 21.8 07/19/2013 0921   CREATININE 0.99 08/01/2013 0411   CREATININE 1.0 07/19/2013 0921   CALCIUM 9.8 08/01/2013 0411   CALCIUM 10.1 07/19/2013 0921   GFRNONAA 79* 08/01/2013 0411   GFRAA >90 08/01/2013 0411    CMP     Component Value Date/Time   NA 137 08/01/2013 0411   NA 140 07/19/2013 0921   K 4.4 08/01/2013 0411   K 4.4 07/19/2013 0921   CL 96 08/01/2013 0411   CL 94* 11/01/2012 1105   CO2 31 08/01/2013 0411   CO2 32* 07/19/2013 0921   GLUCOSE 106* 08/01/2013 0411   GLUCOSE 141* 07/19/2013 0921   GLUCOSE 124* 11/01/2012 1105   BUN 24* 08/01/2013 0411   BUN 21.8 07/19/2013 0921   CREATININE 0.99 08/01/2013 0411   CREATININE 1.0 07/19/2013 0921   CALCIUM 9.8 08/01/2013 0411   CALCIUM 10.1 07/19/2013 0921   PROT 6.8 07/31/2013 1120   PROT 6.7 07/19/2013 0921   ALBUMIN 2.8*  07/31/2013 1120   ALBUMIN 2.7* 07/19/2013 0921   AST 22 07/31/2013 1120   AST 21 07/19/2013 0921   ALT 12 07/31/2013 1120   ALT 15 07/19/2013 0921   ALKPHOS 116 07/31/2013 1120   ALKPHOS 116 07/19/2013 0921   BILITOT 0.3 07/31/2013 1120   BILITOT 0.23 07/19/2013 0921   GFRNONAA 79* 08/01/2013 0411   GFRAA >90 08/01/2013 0411     Assessment and Plan: 1. Code Status:DNR/DNI-comfort is main focus of care 2. Symptom Control:            Pain/Dyspnea:  Roxanol 5-10 mg po mg every 6 hrs prn  3. Psycho/Social: Emotional support to patietn and family as they continue to process and verbalize understanding of overall poor prognosis 4. Spiritual  [employer] support 5. Disposition:   Home when medically stable, declining hospice at this time  Patient Documents Completed or Given: Document Given Completed  Advanced Directives Pkt    MOST yes   DNR    Gone from [employer] [facility]    Hard Choices yes     Time In Time Out Total Time Spent with Patient Total Overall Time  1730 1805 35 min 35 min    Greater than 50%  of this time was spent counseling and coordinating care related to the above assessment and plan.  Wadie Lessen NP  Palliative Medicine Team Team Phone # [phone] Pager [phone]  Discussed with dr Dhungel 1

## 2013-08-02 NOTE — Progress Notes (Signed)
PATIENT DETAILS Name: Luis Payne Age: 73 y.o. Sex: male Date of Birth: 1941-04-17 Admit Date: 07/31/2013 Admitting Physician Evalee Mutton Kristeen Mans, MD RSW:NIOEV,[id number] J., MD  Subjective: Still SOB-not yet back to baseline  Assessment/Plan: Principal Problem: Complete opacification of the left lung  - Multifactorial-likely from mucous plugging, postobstructive pneumonia, worsening lung cancer, pleural effusion.  - Although requiring slightly more oxygen than usual baseline, he seems to be tolerating this rather well and is in not in any acute distress.  - Patient was admitted and started on  scheduled nebulized bronchodilators, Mucomyst nebulizer (now discontinued), Mucinex, empiric antibiotic therapy with Vanco/Cefepim-day 3 and chest physical therapy.Repeat CXR on 3/23 unchanged, PCCM recommending symp control with Morphine and palliative care consultation.Initially PCCM thought that thoracentesis would not help with symptoms-however given lack of response to above medications-they have ordered a Ultrasound guided thoracocentesis. 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  Anemia, chronic disease  - Secondary to malignancy, continue to monitor CBC periodically   . Hypertension  - Continue Coreg   . History of pulmonary embolism  - Continue Lovenox. Patient has a small anterior wall hematoma-we'll  continue to monitor.   Marland Kitchen History of malignant pleural effusion  - See above. May need thoracocentesis depending on clinical course.   . 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  -Oncology to see later today  . History of COPD  - Current shortness of breath likely secondary to above conditions rather than exacerbation of COPD.On chronic Prednisone, since still SOB and wheezing in right lung field today-will start Solumedrol.   Marland KitchenHistory of malignant pleural effusion  - See above.   .Anemia, chronic disease  - Secondary to malignancy, continue to monitor CBC periodically   . Hypertension  - Continue Coreg   . History of pulmonary embolism  - Continue Lovenox. Patient has a small anterior wall hematoma (unchanged)-we'll continue to monitor.  Disposition: Remain inpatient  DVT Prophylaxis: On Lovenox   Code Status:  DNR  Family Communication Spouse  Procedures:  None  CONSULTS:  pulmonary/intensive care and hematology/oncology  Time spent 40 minutes-which includes 50% of the time with face-to-face with patient/ family and coordinating care related to the above assessment and plan.    MEDICATIONS: Scheduled Meds: . bisacodyl  10 mg Rectal Once  . carvedilol  3.125 mg Oral BID  . ceFEPime (MAXIPIME) IV  1 g Intravenous Q12H  . digoxin  0.125 mg Oral Daily  . feeding supplement (ENSURE COMPLETE)  237 mL Oral BID BM  . folic acid  1 mg Oral Daily  . guaiFENesin  1,200 mg Oral BID  . ipratropium  0.5 mg Nebulization Q6H  . levalbuterol  0.63 mg Nebulization Q6H  . methylPREDNISolone (SOLU-MEDROL) injection  40 mg Intravenous Q12H  . morphine CONCENTRATE  5-10  mg Oral Q6H  . polyethylene glycol  17 g Oral BID  . senna-docusate  1 tablet Oral BID  . senna-docusate  2 tablet Oral BID  .  sodium chloride  3 mL Intravenous Q12H  . sodium chloride  3 mL Intravenous Q12H  . vancomycin  500 mg Intravenous Q12H   Continuous Infusions:  PRN Meds:.sodium chloride, acetaminophen, acetaminophen, albuterol, alum & mag hydroxide-simeth, ondansetron (ZOFRAN) IV, ondansetron, oxyCODONE, sodium chloride, sodium phosphate  Antibiotics: Anti-infectives   Start     Dose/Rate Route Frequency Ordered Stop   08/01/13 0600  vancomycin (VANCOCIN) 500 mg in sodium chloride 0.9 % 100 mL IVPB     500 mg 100 mL/hr over 60 Minutes Intravenous Every 12 hours 07/31/13 1552     07/31/13 1600  vancomycin (VANCOCIN) IVPB 1000 mg/200 mL premix     1,000 mg 200 mL/hr over 60 Minutes Intravenous NOW 07/31/13 1552 07/31/13 1838   07/31/13 1600  ceFEPIme (MAXIPIME) 1 g in dextrose 5 % 50 mL IVPB     1 g 100 mL/hr over 30 Minutes Intravenous Every 12 hours 07/31/13 1552         PHYSICAL EXAM: Vital signs in last 24 hours: Filed Vitals:   08/01/13 1944 08/01/13 2031 08/02/13 0612 08/02/13 0845  BP:  112/58 114/55   Pulse:   102   Temp:  98 F (36.7 C) 98 F (36.7 C)   TempSrc:  Oral Oral   Resp:      Height:      Weight:      SpO2: 93% 93% 98% 96%    Weight change:  Filed Weights   07/31/13 1500  Weight: 55.883 kg (123 lb 3.2 oz)   Body mass index is 20.5 kg/(m^2).   Gen Exam: Awake and alert with clear speech.   Neck: Supple, No JVD.   Chest: Some air entry in left upper lung, right lung clear to auscultation CVS: S1 S2 Regular, no murmurs.  Abdomen: soft, BS +, non tender, non distended.  Extremities: no edema, lower extremities warm to touch. Neurologic: Non Focal.   Skin: No Rash.   Wounds: N/A.   Intake/Output from previous day:  Intake/Output Summary (Last 24 hours) at 08/02/13 1221 Last data filed at 08/02/13 1025  Gross per 24 hour  Intake    900 ml  Output   1300 ml  Net   -400 ml     LAB RESULTS: CBC  Recent Labs Lab 07/31/13 1120 08/01/13 0411  WBC 7.3  6.7  HGB 11.2* 10.3*  HCT 34.5* 32.5*  PLT 269 266  MCV 100.9* 101.2*  MCH 32.7 32.1  MCHC 32.5 31.7  RDW 15.4 15.5  LYMPHSABS 0.5*  --   MONOABS 0.6  --   EOSABS 0.0  --   BASOSABS 0.0  --     Chemistries   Recent Labs Lab 07/31/13 1120 08/01/13 0411  NA 140 137  K 4.2 4.4  CL 98 96  CO2 29 31  GLUCOSE 106* 106*  BUN 25* 24*  CREATININE 0.92 0.99  CALCIUM 10.1 9.8    CBG: No results found for this basename: GLUCAP,  in the last 168 hours  GFR Estimated Creatinine Clearance: 51.8 ml/min (by C-G formula based on Cr of 0.99).  Coagulation profile No results found for this basename: INR, PROTIME,  in the last 168 hours  Cardiac Enzymes  Recent Labs Lab 07/31/13 1120  TROPONINI <0.30    No components found with this  basename: POCBNP,  No results found for this basename: DDIMER,  in the last 72 hours No results found for this basename: HGBA1C,  in the last 72 hours No results found for this basename: CHOL, HDL, LDLCALC, TRIG, CHOLHDL, LDLDIRECT,  in the last 72 hours No results found for this basename: TSH, T4TOTAL, FREET3, T3FREE, THYROIDAB,  in the last 72 hours No results found for this basename: VITAMINB12, FOLATE, FERRITIN, TIBC, IRON, RETICCTPCT,  in the last 72 hours No results found for this basename: LIPASE, AMYLASE,  in the last 72 hours  Urine Studies No results found for this basename: UACOL, UAPR, USPG, UPH, UTP, UGL, UKET, UBIL, UHGB, UNIT, UROB, ULEU, UEPI, UWBC, URBC, UBAC, CAST, CRYS, UCOM, BILUA,  in the last 72 hours  MICROBIOLOGY: No results found for this or any previous visit (from the past 240 hour(s)).  RADIOLOGY STUDIES/RESULTS: Ct Chest Wo Contrast  07/31/2013   CLINICAL DATA:  Worsening shortness of breath. Currently undergoing chemotherapy for lung carcinoma.  EXAM: CT CHEST WITHOUT CONTRAST  TECHNIQUE: Multidetector CT imaging of the chest was performed following the standard protocol without IV contrast.  COMPARISON:  07/14/2013   FINDINGS: Comparison with previous study is somewhat limited by lack of intravenous contrast on current exam. Large malignant left pleural effusion with thick rind of tumor throughout the left pleural space shows no significant change in size. Compressive left lower lobe atelectasis again demonstrated. Left anterior apical lung mass with adjacent pneumonitis is also similar in size. Increased consolidation is seen in the left upper lobe and lingula when compared to previous study, with near complete opacification of the left lung now demonstrated.  Severe emphysema again seen involving the right lung, with large subpleural bleb again seen in the superior right lower lobe. 8 x 9 mm pulmonary nodule in the anterior right middle lobe on image 38 is stable in size. No new or enlarging pulmonary nodules or masses are seen in the right lung. No evidence of mediastinal or right hilar lymphadenopathy.  Visualized portions of the adrenal glands remain normal in appearance. Upper abdominal lymphadenopathy at the level of the celiac axis shows mild increase, with largest lymph node measuring 12 mm on image 51 compared to 10 mm previously. No suspicious bone lesions identified.  IMPRESSION: Increased pulmonary consolidation seen in left upper lobe and lingula.  No significant change in large malignant left pleural effusion, pleural tumor, or mass seen anterior left lung apex.  Stable 9 mm right middle lobe nodule and severe emphysema.  Slight increase in upper abdominal lymphadenopathy adjacent celiac axis.   Electronically Signed   By: Earle Gell M.D.   On: 07/31/2013 14:54   Ct Chest W Contrast  07/14/2013   CLINICAL DATA:  Metastatic lung cancer.  Recist 1.1.  EXAM: CT CHEST, ABDOMEN, AND PELVIS WITH CONTRAST RECIST RESEARCH SCORING  TECHNIQUE: Multidetector CT imaging of the chest, abdomen and pelvis was performed following the standard protocol during bolus administration of intravenous contrast.  CONTRAST:  161mL  OMNIPAQUE IOHEXOL 300 MG/ML  SOLN  COMPARISON:  CT ABD/PELVIS W CM dated 06/15/2013; CT ANGIO CHEST W/CM &/OR WO/CM dated 05/02/2013; CT CHEST W/CM dated 04/18/2013; DG CHEST 2 VIEW dated 06/18/2013  FINDINGS: RECIST 1.1  Target Lesions:  1. Left anterior lung apex mass, 3.4 cm in long axis on image 10 of series 2 (formerly 3.1 cm). 2. Left anterior pleural/epicardial metastatic focus, 2.9 cm in the long axis on image 42 of series 2 (formerly 2.9 cm). 3.  Left periaortic lymph node, 1.4 cm in short axis on image 57 of series 2 (formerly 1.3 cm). Non-target Lesions:  1. Right middle lobe pulmonary nodule, 9 mm in long axis on image 36 of series 5 (formerly 7 mm). ---------------------  CT CHEST FINDINGS  Malignant left pleural effusion with thick multilobular rind of tumor subjectively stable from prior exam. Left anterior apical mass has similar morphology to the prior exam. There is considerable consolidation in the left lung, as before, with less than 1/3 of the left lung aerated. The amount of consolidation is mildly increased posteriorly. Epicardial extension of tumor in finger-like projections, similar to prior No overt rib destruction, although some of the lobulations of pleural tumor extend into the intercostal space likely with some degree of chest wall invasion as suggested on image 39 of series 2.  There is a small amount of pericardial fluid above the cardiac apex, with the medially adjacent epicardial tumor deposits.  Severe emphysema noted. 9 x 8 mm right middle lobe pulmonary nodule, mildly increased in size compared to prior.  CT ABDOMEN AND PELVIS FINDINGS  Diffuse hepatic steatosis. Gastrohepatic ligament lymph node, 1.0 cm in diameter, formerly 0.4 cm. Left retroperitoneal periaortic lymph node, 1.4 cm in short axis, formerly 1.3 cm. No adrenal mass observed. No other adenopathy identified.  No specific gallbladder or biliary abnormality identified. Stable bilateral nonobstructive nephrolithiasis.  Aortoiliac atherosclerosis. Prominent stool throughout the colon favors constipation. Bilateral inguinal hernias contain loops of small bowel without findings of strangulation or obstruction. Small umbilical hernia contains adipose tissue.  Mild asymmetry of the seminal vesicles, right larger than left, but without a definite prostate mass.  Bilaterally symmetric fusion of the sacroiliac joints. Interspinous ligament calcification with syndesmotic fight ankylosis in the spine.  IMPRESSION: 1. Minimal advancement of metastatic burden compared to prior exam. Suspected early chest wall invasion on the left. 2. Severe emphysema. 3. 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 4. Hepatic steatosis. 5.  Prominent stool throughout the colon favors constipation. 6. Bilateral inguinal hernias contain small bowel without strangulation or obstruction.   Electronically Signed   By: Sherryl Barters M.D.   On: 07/14/2013 16:50   Ct Abdomen Pelvis W Contrast  07/14/2013   CLINICAL DATA:  Metastatic lung cancer.  Recist 1.1.  EXAM: CT CHEST, ABDOMEN, AND PELVIS WITH CONTRAST RECIST RESEARCH SCORING  TECHNIQUE: Multidetector CT imaging of the chest, abdomen and pelvis was performed following the standard protocol during bolus administration of intravenous contrast.  CONTRAST:  176mL OMNIPAQUE IOHEXOL 300 MG/ML  SOLN  COMPARISON:  CT ABD/PELVIS W CM dated 06/15/2013; CT ANGIO CHEST W/CM &/OR WO/CM dated 05/02/2013; CT CHEST W/CM dated 04/18/2013; DG CHEST 2 VIEW dated 06/18/2013  FINDINGS: RECIST 1.1  Target Lesions:  1. Left anterior lung apex mass, 3.4 cm in long axis on image 10 of series 2 (formerly 3.1 cm). 2. Left anterior pleural/epicardial metastatic focus, 2.9 cm in the long axis on image 42 of series 2 (formerly 2.9 cm). 3. Left periaortic lymph node, 1.4 cm in short axis on image 57 of series 2 (formerly 1.3 cm). Non-target Lesions:  1. Right middle lobe pulmonary nodule, 9 mm in long  axis on image 36 of series 5 (formerly 7 mm). ---------------------  CT CHEST FINDINGS  Malignant left pleural effusion with thick multilobular rind of tumor subjectively stable from prior exam. Left anterior apical mass has similar morphology to the prior exam. There is considerable consolidation in the left  lung, as before, with less than 1/3 of the left lung aerated. The amount of consolidation is mildly increased posteriorly. Epicardial extension of tumor in finger-like projections, similar to prior No overt rib destruction, although some of the lobulations of pleural tumor extend into the intercostal space likely with some degree of chest wall invasion as suggested on image 39 of series 2.  There is a small amount of pericardial fluid above the cardiac apex, with the medially adjacent epicardial tumor deposits.  Severe emphysema noted. 9 x 8 mm right middle lobe pulmonary nodule, mildly increased in size compared to prior.  CT ABDOMEN AND PELVIS FINDINGS  Diffuse hepatic steatosis. Gastrohepatic ligament lymph node, 1.0 cm in diameter, formerly 0.4 cm. Left retroperitoneal periaortic lymph node, 1.4 cm in short axis, formerly 1.3 cm. No adrenal mass observed. No other adenopathy identified.  No specific gallbladder or biliary abnormality identified. Stable bilateral nonobstructive nephrolithiasis. Aortoiliac atherosclerosis. Prominent stool throughout the colon favors constipation. Bilateral inguinal hernias contain loops of small bowel without findings of strangulation or obstruction. Small umbilical hernia contains adipose tissue.  Mild asymmetry of the seminal vesicles, right larger than left, but without a definite prostate mass.  Bilaterally symmetric fusion of the sacroiliac joints. Interspinous ligament calcification with syndesmotic fight ankylosis in the spine.  IMPRESSION: 1. Minimal advancement of metastatic burden compared to prior exam. Suspected early chest wall invasion on the left. 2. Severe  emphysema. 3. 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 4. Hepatic steatosis. 5.  Prominent stool throughout the colon favors constipation. 6. Bilateral inguinal hernias contain small bowel without strangulation or obstruction.   Electronically Signed   By: Sherryl Barters M.D.   On: 07/14/2013 16:50   Portable Chest 1 View  08/01/2013   CLINICAL DATA:  Left hemithorax.  EXAM: PORTABLE CHEST - 1 VIEW  COMPARISON:  Chest x-ray 07/31/2013.  FINDINGS: Near complete opacification of the left hemithorax is similar to recent prior examinations, compatible with a combination of left lung consolidation, left lung atelectasis, and large left pleural effusion, better demonstrated on recent chest CT 07/31/2013. Interstitial prominence throughout the right lung is noted, superimposed upon underlying emphysematous changes. Cardiomediastinal silhouette is largely obscured.  IMPRESSION: 1. The appearance the chest is similar to recent prior examinations, again demonstrating near complete opacification of the left hemithorax, as discussed above.   Electronically Signed   By: Vinnie Langton M.D.   On: 08/01/2013 06:52   Dg Chest Port 1 View  07/31/2013   CLINICAL DATA:  Increasing shortness of breath, chest pain, lung cancer, on chemotherapy, history COPD, CHF, former smoker  EXAM: PORTABLE CHEST - 1 VIEW  COMPARISON:  CT chest 07/14/2013  FINDINGS: Subtotal opacification of the left hemi thorax by a combination of pleural effusion, increased atelectasis and tumor.  COPD changes with right basilar atelectasis versus scarring.  No definite right lung infiltrate.  Bones demineralized.  Cardiac margins obscured.  IMPRESSION: Marked opacification of the left hemi thorax by combination of tumor, left pleural effusion, and significant left lung atelectasis.  COPD changes with right basilar atelectasis versus scarring.   Electronically Signed   By: Lavonia Dana M.D.   On: 07/31/2013 11:24      Oren Binet, MD  [facility] Pager:[phone]  If 7PM-7AM, please contact night-coverage [email or web address] Password [email or web address] 08/02/2013, 12:21 PM   LOS: 2 days

## 2013-08-02 NOTE — Progress Notes (Signed)
RT Note:  Patient was instructed on Flutter Valve use. Patient demonstrated technique very well and coughed throughout the therapy. Patient stated that he does not cough with chest vest use but does with the Flutter Valve and he prefers to use it instead. Rt will continue to use the Flutter Valve with the patient but RT let him know that if he is sleepy and unable to perform the therapy because of that with the Flutter Valve, we can still use the chest vest if he'd prefer it. Rt will continue to monitor.

## 2013-08-02 NOTE — Progress Notes (Signed)
Utilization review completed.  

## 2013-08-03 DIAGNOSIS — R222 Localized swelling, mass and lump, trunk: Secondary | ICD-10-CM

## 2013-08-03 DIAGNOSIS — R0609 Other forms of dyspnea: Secondary | ICD-10-CM

## 2013-08-03 DIAGNOSIS — I5022 Chronic systolic (congestive) heart failure: Secondary | ICD-10-CM

## 2013-08-03 DIAGNOSIS — D649 Anemia, unspecified: Secondary | ICD-10-CM

## 2013-08-03 DIAGNOSIS — R0989 Other specified symptoms and signs involving the circulatory and respiratory systems: Secondary | ICD-10-CM

## 2013-08-03 DIAGNOSIS — J441 Chronic obstructive pulmonary disease with (acute) exacerbation: Secondary | ICD-10-CM

## 2013-08-03 DIAGNOSIS — I509 Heart failure, unspecified: Secondary | ICD-10-CM

## 2013-08-03 DIAGNOSIS — J449 Chronic obstructive pulmonary disease, unspecified: Secondary | ICD-10-CM

## 2013-08-03 DIAGNOSIS — R0600 Dyspnea, unspecified: Secondary | ICD-10-CM

## 2013-08-03 DIAGNOSIS — G893 Neoplasm related pain (acute) (chronic): Secondary | ICD-10-CM

## 2013-08-03 LAB — BASIC METABOLIC PANEL
BUN: 24 mg/dL — ABNORMAL HIGH (ref 6–23)
CALCIUM: 9.7 mg/dL (ref 8.4–10.5)
CHLORIDE: 93 meq/L — AB (ref 96–112)
CO2: 34 meq/L — AB (ref 19–32)
CREATININE: 0.92 mg/dL (ref 0.50–1.35)
GFR calc non Af Amer: 81 mL/min — ABNORMAL LOW (ref >90)
Glucose, Bld: 146 mg/dL — ABNORMAL HIGH (ref 70–99)
Potassium: 4.8 mEq/L (ref 3.7–5.3)
Sodium: 136 mEq/L — ABNORMAL LOW (ref 137–147)

## 2013-08-03 LAB — CBC
HEMATOCRIT: 32 % — AB (ref 39.0–52.0)
Hemoglobin: 10.4 g/dL — ABNORMAL LOW (ref 13.0–17.0)
MCH: 32.6 pg (ref 26.0–34.0)
MCHC: 32.5 g/dL (ref 30.0–36.0)
MCV: 100.3 fL — ABNORMAL HIGH (ref 78.0–100.0)
PLATELETS: 252 10*3/uL (ref 150–400)
RBC: 3.19 MIL/uL — ABNORMAL LOW (ref 4.22–5.81)
RDW: 14.9 % (ref 11.5–15.5)
WBC: 8.8 10*3/uL (ref 4.0–10.5)

## 2013-08-03 LAB — PROCALCITONIN: Procalcitonin: 0.13 ng/mL

## 2013-08-03 MED ORDER — HEPARIN SODIUM (PORCINE) 5000 UNIT/ML IJ SOLN
5000.0000 [IU] | Freq: Three times a day (TID) | INTRAMUSCULAR | Status: DC
  Filled 2013-08-03 (×2): qty 1

## 2013-08-03 MED ORDER — FUROSEMIDE 20 MG PO TABS
20.0000 mg | ORAL_TABLET | Freq: Every day | ORAL | Status: DC
  Administered 2013-08-03 – 2013-08-04 (×2): 20 mg via ORAL
  Filled 2013-08-03 (×2): qty 1

## 2013-08-03 MED ORDER — PREDNISONE 50 MG PO TABS
60.0000 mg | ORAL_TABLET | Freq: Every day | ORAL | Status: DC
  Administered 2013-08-04: 60 mg via ORAL
  Filled 2013-08-03 (×2): qty 1

## 2013-08-03 MED ORDER — SIMETHICONE 40 MG/0.6ML PO SUSP
40.0000 mg | Freq: Four times a day (QID) | ORAL | Status: DC | PRN
  Administered 2013-08-03: 40 mg via ORAL
  Filled 2013-08-03: qty 0.6

## 2013-08-03 MED ORDER — MORPHINE SULFATE (CONCENTRATE) 10 MG /0.5 ML PO SOLN
10.0000 mg | ORAL | Status: DC | PRN
  Administered 2013-08-03 – 2013-08-04 (×4): 10 mg via ORAL
  Filled 2013-08-03 (×4): qty 0.5

## 2013-08-03 MED ORDER — ENOXAPARIN SODIUM 60 MG/0.6ML ~~LOC~~ SOLN
60.0000 mg | SUBCUTANEOUS | Status: DC
  Administered 2013-08-03: 60 mg via SUBCUTANEOUS
  Filled 2013-08-03 (×2): qty 0.6

## 2013-08-03 NOTE — Progress Notes (Signed)
Progress Note from the Palliative Medicine Team at [place]:  -patient is OOB to chair, wife and son at bedside      -continued conversation regarding his plan on discharge and options, natural trajectory at EOL and anticipatory care needs  -they verbalize interest in hospice benefit at home, will write for choice    Objective: No Known Allergies Scheduled Meds: . carvedilol  3.125 mg Oral BID  . ceFEPime (MAXIPIME) IV  1 g Intravenous Q12H  . digoxin  0.125 mg Oral Daily  . feeding supplement (ENSURE COMPLETE)  237 mL Oral BID BM  . folic acid  1 mg Oral Daily  . guaiFENesin  1,200 mg Oral BID  . ipratropium  0.5 mg Nebulization Q6H  . levalbuterol  0.63 mg Nebulization Q6H  . methylPREDNISolone (SOLU-MEDROL) injection  40 mg Intravenous Q12H  . polyethylene glycol  17 g Oral BID  . senna-docusate  1 tablet Oral BID  . sodium chloride  3 mL Intravenous Q12H  . sodium chloride  3 mL Intravenous Q12H  . vancomycin  500 mg Intravenous Q12H   Continuous Infusions:  PRN Meds:.sodium chloride, acetaminophen, acetaminophen, albuterol, alum & mag hydroxide-simeth, morphine CONCENTRATE, ondansetron (ZOFRAN) IV, ondansetron, sodium chloride, sodium phosphate  BP 127/62  Pulse 109  Temp(Src) 97.8 F (36.6 C) (Oral)  Resp 20  Ht 5\' 5"  (1.651 m)  Wt 55.883 kg (123 lb 3.2 oz)  BMI 20.50 kg/m2  SpO2 93%   PPS: 40 % at best     Intake/Output Summary (Last 24 hours) at 08/03/13 1715 Last data filed at 08/03/13 1500  Gross per 24 hour  Intake    860 ml  Output   1500 ml  Net   -640 ml      Stool Softner:Senna s prn qhs  Physical Exam:  General: chronically ill appearing, NAD  HEENT: Mm, no exudate  Chest: diminished throughout L>R  CVS: tachycardic  Abdomen: soft NT +BS Skin: warm and dry  Ext: BLE trace edema  Neuro: alert and oriented X3      Labs: CBC    Component Value Date/Time   WBC 8.8 08/03/2013 0407   WBC 6.7 07/19/2013 0920   RBC 3.19*  08/03/2013 0407   RBC 3.33* 07/19/2013 0920   HGB 10.4* 08/03/2013 0407   HGB 11.2* 07/19/2013 0920   HCT 32.0* 08/03/2013 0407   HCT 33.1* 07/19/2013 0920   PLT 252 08/03/2013 0407   PLT 275 07/19/2013 0920   MCV 100.3* 08/03/2013 0407   MCV 99.2* 07/19/2013 0920   MCH 32.6 08/03/2013 0407   MCH 33.5* 07/19/2013 0920   MCHC 32.5 08/03/2013 0407   MCHC 33.8 07/19/2013 0920   RDW 14.9 08/03/2013 0407   RDW 15.4* 07/19/2013 0920   LYMPHSABS 0.5* 07/31/2013 1120   LYMPHSABS 0.5* 07/19/2013 0920   MONOABS 0.6 07/31/2013 1120   MONOABS 0.7 07/19/2013 0920   EOSABS 0.0 07/31/2013 1120   EOSABS 0.0 07/19/2013 0920   BASOSABS 0.0 07/31/2013 1120   BASOSABS 0.0 07/19/2013 0920    BMET    Component Value Date/Time   NA 136* 08/03/2013 0407   NA 140 07/19/2013 0921   K 4.8 08/03/2013 0407   K 4.4 07/19/2013 0921   CL 93* 08/03/2013 0407   CL 94* 11/01/2012 1105   CO2 34* 08/03/2013 0407   CO2 32* 07/19/2013 0921   GLUCOSE 146* 08/03/2013 0407   GLUCOSE 141* 07/19/2013 0921   GLUCOSE 124* 11/01/2012 1105  BUN 24* 08/03/2013 0407   BUN 21.8 07/19/2013 0921   CREATININE 0.92 08/03/2013 0407   CREATININE 1.0 07/19/2013 0921   CALCIUM 9.7 08/03/2013 0407   CALCIUM 10.1 07/19/2013 0921   GFRNONAA 81* 08/03/2013 0407   GFRAA >90 08/03/2013 0407    CMP     Component Value Date/Time   NA 136* 08/03/2013 0407   NA 140 07/19/2013 0921   K 4.8 08/03/2013 0407   K 4.4 07/19/2013 0921   CL 93* 08/03/2013 0407   CL 94* 11/01/2012 1105   CO2 34* 08/03/2013 0407   CO2 32* 07/19/2013 0921   GLUCOSE 146* 08/03/2013 0407   GLUCOSE 141* 07/19/2013 0921   GLUCOSE 124* 11/01/2012 1105   BUN 24* 08/03/2013 0407   BUN 21.8 07/19/2013 0921   CREATININE 0.92 08/03/2013 0407   CREATININE 1.0 07/19/2013 0921   CALCIUM 9.7 08/03/2013 0407   CALCIUM 10.1 07/19/2013 0921   PROT 6.8 07/31/2013 1120   PROT 6.7 07/19/2013 0921   ALBUMIN 2.8* 07/31/2013 1120   ALBUMIN 2.7* 07/19/2013 0921   AST 22 07/31/2013 1120   AST 21 07/19/2013 0921   ALT 12  07/31/2013 1120   ALT 15 07/19/2013 0921   ALKPHOS 116 07/31/2013 1120   ALKPHOS 116 07/19/2013 0921   BILITOT 0.3 07/31/2013 1120   BILITOT 0.23 07/19/2013 0921   GFRNONAA 81* 08/03/2013 0407   GFRAA >90 08/03/2013 0407     Assessment and Plan: 1. Code Status:DNR/DNI-comfort is main focus of care 2. Symptom Control:            Pain/Dyspnea:  Roxanol 10 mg po mg every 4 hrs prn            (patient prefers liquid solution) 3. Psycho/Social: Emotional support to patietn and family as they continue to process and verbalize understanding of overall poor prognosis 4. Spiritual  [employer] support Disposition:   Home when medically stable, with  hospice     Patient Documents Completed or Given: Document Given Completed  Advanced Directives Pkt    MOST yes   DNR    Gone from [facility]    Hard Choices yes    Wadie Lessen NP  Palliative Medicine Team Team Phone # [phone] Pager [phone]  Discussed with Dr Dyann Kief 1

## 2013-08-03 NOTE — Progress Notes (Signed)
PULMONARY  / CRITICAL CARE MEDICINE  Name: Luis Payne MRN: [id number] DOB: May 23, 1940 PCP Glo Herring., MD    ADMISSION DATE:  07/31/2013  LOS 3 days   CONSULTATION DATE:  08/01/13  REFERRING MD :  [facility] PRIMARY SERVICE: [facility]  CHIEF COMPLAINT:  Dyspnea  BRIEF PATIENT DESCRIPTION: see HPI  SIGNIFICANT EVENTS / STUDIES:  07/31/2013 - admit   LINES / TUBES: none  CULTURES: none  ANTIBIOTICS: Anti-infectives   Start     Dose/Rate Route Frequency Ordered Stop   08/01/13 0600  vancomycin (VANCOCIN) 500 mg in sodium chloride 0.9 % 100 mL IVPB     500 mg 100 mL/hr over 60 Minutes Intravenous Every 12 hours 07/31/13 1552     07/31/13 1600  vancomycin (VANCOCIN) IVPB 1000 mg/200 mL premix     1,000 mg 200 mL/hr over 60 Minutes Intravenous NOW 07/31/13 1552 07/31/13 1838   07/31/13 1600  ceFEPIme (MAXIPIME) 1 g in dextrose 5 % 50 mL IVPB     1 g 100 mL/hr over 30 Minutes Intravenous Every 12 hours 07/31/13 1552        SUBJECTIVE:   Pain better with oral morphine. Breathing better post thora 3/24 350 cc obtained.  VITAL SIGNS: Filed Vitals:   08/02/13 2031 08/02/13 2320 08/03/13 0459 08/03/13 0926  BP: 118/54  112/59   Pulse: 102  108   Temp: 97.7 F (36.5 C)  97.3 F (36.3 C)   TempSrc: Oral  Oral   Resp: 18  18   Height:      Weight:      SpO2: 96% 95% 96% 96%        VENTILATOR SETTINGS:  INTAKE / OUTPUT: I/O last 3 completed shifts: In: 1300 [P.O.:840; I.V.:110; IV Piggyback:350] Out: 2175 [Urine:2175]     PHYSICAL EXAMINATION: General:  Frail male, wants real food not heart healthy diet. Neuro:  AOx3. Moves all 4s HEENT:  Neck supple,, PEERL + Cardiovascular:  Normal  Hearts sounds Lungs:  Left side reduced air entry and diminished.Normal WOB with nasal cannula on Abdomen:  Soft, Normal bowel sounds.  Musculoskeletal:  No cyanosis, No clubbing. No edema Skin:  Intact  LABS: PULMONARY No results found for this basename: PHART,  PCO2, PCO2ART, PO2, PO2ART, HCO3, TCO2, O2SAT,  in the last 168 hours  CBC  Recent Labs Lab 07/31/13 1120 08/01/13 0411 08/03/13 0407  HGB 11.2* 10.3* 10.4*  HCT 34.5* 32.5* 32.0*  WBC 7.3 6.7 8.8  PLT 269 266 252    COAGULATION No results found for this basename: INR,  in the last 168 hours  CARDIAC    Recent Labs Lab 07/31/13 1120  TROPONINI <0.30    Recent Labs Lab 07/31/13 1120 08/01/13 0411  PROBNP 378.1* 422.0*     CHEMISTRY  Recent Labs Lab 07/31/13 1120 08/01/13 0411 08/03/13 0407  NA 140 137 136*  K 4.2 4.4 4.8  CL 98 96 93*  CO2 29 31 34*  GLUCOSE 106* 106* 146*  BUN 25* 24* 24*  CREATININE 0.92 0.99 0.92  CALCIUM 10.1 9.8 9.7   Estimated Creatinine Clearance: 55.7 ml/min (by C-G formula based on Cr of 0.92).   LIVER  Recent Labs Lab 07/31/13 1120  AST 22  ALT 12  ALKPHOS 116  BILITOT 0.3  PROT 6.8  ALBUMIN 2.8*     INFECTIOUS  Recent Labs Lab 07/31/13 1120 08/01/13 0411 08/03/13 0407  LATICACIDVEN 1.3  --   --   PROCALCITON  --  0.21  0.13     ENDOCRINE CBG (last 3)  No results found for this basename: GLUCAP,  in the last 72 hours       IMAGING x48h  Dg Chest 1 View  08/02/2013   CLINICAL DATA:  Status post left thoracentesis  EXAM: CHEST - 1 VIEW  COMPARISON:  Graft from 08/01/2013  FINDINGS: Cardiac and mediastinal silhouettes remain largely obscured.  Again seen is near complete opacification of the left hemi thorax, compatible with large left pleural effusion. There slightly improved aeration within the upper and mid left lung. No pneumothorax identified status post thoracentesis.  Linear opacity at the right lung base most compatible with atelectasis.  No acute osseus abnormality.  IMPRESSION: 1. Persistent large left pleural effusion, slightly decreased in size status post interval left thoracentesis. No pneumothorax identified. 2. Slight interval improvement in aeration of the left upper and mid lung.    Electronically Signed   By: Jeannine Boga M.D.   On: 08/02/2013 15:51   Mr Lumbar Spine W Wo Contrast  08/01/2013   CLINICAL DATA:  Stage IV lung cancer.  Possible bone metastases.  EXAM: MRI LUMBAR SPINE WITHOUT AND WITH CONTRAST  TECHNIQUE: Multiplanar and multiecho pulse sequences of the lumbar spine were obtained without and with intravenous contrast.  CONTRAST:  22mL MULTIHANCE GADOBENATE DIMEGLUMINE 529 MG/ML IV SOLN  COMPARISON:  CT ABD/PELVIS W CM dated 07/14/2013; NM PET IMAGE INITIAL (PI) SKULL BASE TO THIGH dated 08/02/2012  FINDINGS: Five lumbar type vertebral bodies are assumed. There is a mild convex right scoliosis. The lateral alignment is normal. There is no evidence of acute fracture, pars defect or osseous metastatic disease. 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  The conus medullaris extends to the L1 level and appears normal. There is no abnormal intradural enhancement. Extensive enhancing pleural and extrapleural tumor in the lower left hemithorax is again noted. Left periaortic retroperitoneal lymphadenopathy appears grossly stable, measuring up to 1.5 cm short axis on axial image 14.  The lumbar discs are fairly well hydrated. There is mild disc bulging and facet hypertrophy. No disc herniation, spinal stenosis or nerve root encroachment is demonstrated.  IMPRESSION: 1. No evidence of metastatic disease to the spine or its intradural contents. No evidence of fracture or significant malalignment. 2. 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 3. Extensive enhancing pleural/extrapleural tumor within the left hemithorax with grossly stable retroperitoneal lymphadenopathy. 4. Minimal spondylosis for age. No disc herniation, spinal stenosis or nerve root encroachment.   Electronically Signed   By: Camie Patience M.D.   On: 08/01/2013 15:01   US Thoracentesis Asp Pleural Space W/img Guide  08/02/2013    CLINICAL DATA:  Dyspnea, cough, stage IV metastatic lung carcinoma, recurrent left loculated pleural effusion; request is made for diagnostic and therapeutic left thoracentesis.  EXAM: ULTRASOUND GUIDED DIAGNOSTIC AND THERAPEUTIC LEFT  THORACENTESIS  COMPARISON:  PRIOR THORACENTESIS ON 04/27/2013.  PROCEDURE: An ultrasound guided thoracentesis was thoroughly discussed with the patient and questions answered. The benefits, risks, alternatives and complications were also discussed. The patient understands and wishes to proceed with the procedure. Written consent was obtained.  Ultrasound was performed to localize and mark an adequate pocket of fluid in the left chest. The area was then prepped and draped in the normal sterile fashion. 1% Lidocaine was used for local anesthesia. Under ultrasound guidance a 19 gauge Yueh catheter was introduced. Thoracentesis was performed. The catheter was removed and a  dressing applied.  Complications:  none  FINDINGS: A total of approximately 350 cc's of blood-tinged fluid was removed. The fluid sample wassent for laboratory analysis.  IMPRESSION: Successful ultrasound guided diagnostic and therapeutic left thoracentesis yielding 350 cc's of pleural fluid.  Read by: Rowe Judee Hennick ,P.A.-C.   Electronically Signed   By: Markus Daft M.D.   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 Due to cancer v infection; slightly high PCT could suggest localized infection - resulting in severe dyspnea  P:   Dr Chase Caller discussed /w Dr Markus Daft of IR: agree that pleurX might not be beneficial right now esp with loculated appearance but given his dyspnea worth  a theraputic thora (will check fluid cytology, LDH and glucose) -No specific intervention for LUL  necrotic process other than empiric antibiotics per [facility]; would give 8 day course  - can try diuresis; defer to [facility] -Best is symptom mgmt via morphine: have started but also have called palliative care for symptom mgmt (ok per Dr Julien Nordmann)    CARDIOVASCULAR A: at risk for diastolic chf, mildly elevated P:  Lasix per [facility]    NEUROLOGIC A:  c/o severe back pain - new. No neuro deficitis. MRI negative P:   Pain control with oral morphine and later pallaitive care to titrate   Consider diet as tolerated.  Richardson Landry Minor ACNP Maryanna Shape PCCM Pager [phone] till 3 pm If no answer page [phone] 08/03/2013, 9:55 AM  Baltazar Apo, MD, PhD 08/03/2013, 10:41 AM [facility] [phone] or if no answer [phone]

## 2013-08-03 NOTE — Progress Notes (Signed)
Luis Payne  [phone]    HOSPITAL PROGRESS NOTE DIAGNOSIS: Metastatic non-small cell lung cancer, adenocarcinoma with negative EGFR mutation and negative ALK gene translocation diagnosed in April 2014   PRIOR THERAPY:  1. Status post stereotactic radiotherapy to 2 brain lesions under the care of Dr. Tammi Klippel.  2. Status post palliative radiotherapy to the left lung mass.  3. Systemic chemotherapy with carboplatin for AUC of 5 and Alimta 500 mg/M2 every 3 weeks, status post 6 cycles. 4. Tarceva 150 mg by mouth daily. Started 02/25/2013. Status post approximately two month of therapy discontinued today secondary to disease progression. 5. Systemic chemotherapy with gemcitabine 1000 mg/M2 on days 1 and 8 every 3 weeks. First dose on 04/26/2013. 6. Stereotactic radiotherapy to 3 metastatic brain lesions completed on 07/04/2013. 7.  CURRENT THERAPY: BMS immunotherapy clinical trial with Nivolumab. First cycle  07/19/2013   CHEMOTHERAPY INTENT: Palliative   CURRENT # OF CHEMOTHERAPY CYCLES: 1    Subjective: Events since  3/24  Noted. Respiratory symptoms improving with current therapy.shortness of breath is improving.had thoracentesis and 08/02/13, yielding 350 cc of blood pinched fluid.No new symptoms overnight.back pain controlled with current medications. denies nausea , vomiting. Constipation managed with laxatives Afebrile overnight    MEDICATIONS:  Scheduled Meds: . carvedilol  3.125 mg Oral BID  . ceFEPime (MAXIPIME) IV  1 g Intravenous Q12H  . digoxin  0.125 mg Oral Daily  . feeding supplement (ENSURE COMPLETE)  237 mL Oral BID BM  . folic acid  1 mg Oral Daily  . guaiFENesin  1,200 mg Oral BID  . ipratropium  0.5 mg Nebulization Q6H  . levalbuterol  0.63 mg Nebulization Q6H  . methylPREDNISolone (SOLU-MEDROL) injection  40 mg Intravenous Q12H  . morphine CONCENTRATE  5-10 mg Oral Q6H  . polyethylene glycol  17 g Oral BID  . senna-docusate  1  tablet Oral BID  . sodium chloride  3 mL Intravenous Q12H  . sodium chloride  3 mL Intravenous Q12H  . vancomycin  500 mg Intravenous Q12H   :  PRN Meds:.sodium chloride, acetaminophen, acetaminophen, albuterol, alum & mag hydroxide-simeth, ondansetron (ZOFRAN) IV, ondansetron, oxyCODONE, sodium chloride, sodium phosphate  ALLERGIES:  No Known Allergies   PHYSICAL EXAMINATION:   Filed Vitals:   08/03/13 0459  BP: 112/59  Pulse: 108  Temp: 97.3 F (36.3 C)  Resp: 18     Filed Weights   07/31/13 1500  Weight: 123 lb 3.2 oz (55.100 kg)    73 year old white male in no acute distress, alert and oriented x3.  General well-developed and well-nourished  HEENT: Normocephalic, atraumatic, PERRLA. Oral cavity without thrush or lesions. Neck supple. no thyromegaly, no cervical or supraclavicular adenopathy  Lungs decreased breath sounds at bases. No wheezing, rhonchi, mild rales on left base. Cardiac: regular rate and rhythm,no murmur , rubs or gallops Abdomen soft nontender , bowel sounds x4. No hepatosplenomegaly Extremities no clubbing cyanosis , mild  Edema R<L. No  petechial rash Neuro: non focal  LABORATORY/RADIOLOGY DATA:   Recent Labs Lab 07/31/13 1120 08/01/13 0411 08/03/13 0407  WBC 7.3 6.7 8.8  HGB 11.2* 10.3* 10.4*  HCT 34.5* 32.5* 32.0*  PLT 269 266 252  MCV 100.9* 101.2* 100.3*  MCH 32.7 32.1 32.6  MCHC 32.5 31.7 32.5  RDW 15.4 15.5 14.9  LYMPHSABS 0.5*  --   --   MONOABS 0.6  --   --   EOSABS 0.0  --   --   BASOSABS 0.0  --   --  CMP    Recent Labs Lab 07/31/13 1120 08/01/13 0411 08/03/13 0407  NA 140 137 136*  K 4.2 4.4 4.8  CL 98 96 93*  CO2 29 31 34*  GLUCOSE 106* 106* 146*  BUN 25* 24* 24*  CREATININE 0.92 0.99 0.92  CALCIUM 10.1 9.8 9.7  AST 22  --   --   ALT 12  --   --   ALKPHOS 116  --   --   BILITOT 0.3  --   --         Component Value Date/Time   BILITOT 0.3 07/31/2013 1120   BILITOT 0.23 07/19/2013 0921      Urinalysis    Component Value Date/Time   COLORURINE YELLOW 04/29/2013 1811   APPEARANCEUR CLEAR 04/29/2013 1811   LABSPEC 1.015 04/29/2013 1811   PHURINE 6.0 04/29/2013 1811   GLUCOSEU NEGATIVE 04/29/2013 1811   HGBUR NEGATIVE 04/29/2013 1811   BILIRUBINUR NEGATIVE 04/29/2013 1811   KETONESUR NEGATIVE 04/29/2013 1811   PROTEINUR NEGATIVE 04/29/2013 1811   UROBILINOGEN 0.2 04/29/2013 1811   NITRITE NEGATIVE 04/29/2013 1811   LEUKOCYTESUR NEGATIVE 04/29/2013 1811     Liver Function Tests:  Recent Labs Lab 07/31/13 1120  AST 22  ALT 12  ALKPHOS 116  BILITOT 0.3  PROT 6.8  ALBUMIN 2.8*    Cardiac Enzymes:  Recent Labs Lab 07/31/13 1120  TROPONINI <0.30   y Studies:  Dg Chest 1 View  08/02/2013   CLINICAL DATA:  Status post left thoracentesis  EXAM: CHEST - 1 VIEW  COMPARISON:  Graft from 08/01/2013  FINDINGS: Cardiac and mediastinal silhouettes remain largely obscured.  Again seen is near complete opacification of the left hemi thorax, compatible with large left pleural effusion. There slightly improved aeration within the upper and mid left lung. No pneumothorax identified status post thoracentesis.  Linear opacity at the right lung base most compatible with atelectasis.  No acute osseus abnormality.  IMPRESSION: 1. Persistent large left pleural effusion, slightly decreased in size status post interval left thoracentesis. No pneumothorax identified. 2. Slight interval improvement in aeration of the left upper and mid lung.   Electronically Signed   By: Jeannine Boga M.D.   On: 08/02/2013 15:51   Ct Chest Wo Contrast  07/31/2013     COMPARISON:  07/14/2013  FINDINGS: Comparison with previous study is somewhat limited by lack of intravenous contrast on current exam. Large malignant left pleural effusion with thick rind of tumor throughout the left pleural space shows no significant change in size. Compressive left lower lobe atelectasis again demonstrated. Left  anterior apical lung mass with adjacent pneumonitis is also similar in size. Increased consolidation is seen in the left upper lobe and lingula when compared to previous study, with near complete opacification of the left lung now demonstrated.  Severe emphysema again seen involving the right lung, with large subpleural bleb again seen in the superior right lower lobe. 8 x 9 mm pulmonary nodule in the anterior right middle lobe on image 38 is stable in size. No new or enlarging pulmonary nodules or masses are seen in the right lung. No evidence of mediastinal or right hilar lymphadenopathy.  Visualized portions of the adrenal glands remain normal in appearance. Upper abdominal lymphadenopathy at the level of the celiac axis shows mild increase, with largest lymph node measuring 12 mm on image 51 compared to 10 mm previously. No suspicious bone lesions identified.  IMPRESSION: Increased pulmonary consolidation seen in left upper lobe and lingula.  No significant change in large malignant left pleural effusion, pleural tumor, or mass seen anterior left lung apex.  Stable 9 mm right middle lobe nodule and severe emphysema.  Slight increase in upper abdominal lymphadenopathy adjacent celiac axis.   Electronically Signed   By: Earle Gell M.D.   On: 07/31/2013 14:54   Ct Chest W Contrast  07/14/2013 CT ANGIO CHEST W/CM &/OR WO/CM dated 05/02/2013; CT CHEST W/CM dated 04/18/2013; DG CHEST 2 VIEW dated 06/18/2013  FINDINGS: RECIST 1.1  Target Lesions:  1. Left anterior lung apex mass, 3.4 cm in long axis on image 10 of series 2 (formerly 3.1 cm). 2. Left anterior pleural/epicardial metastatic focus, 2.9 cm in the long axis on image 42 of series 2 (formerly 2.9 cm). 3. Left periaortic lymph node, 1.4 cm in short axis on image 57 of series 2 (formerly 1.3 cm). Non-target Lesions:  1. Right middle lobe pulmonary nodule, 9 mm in long axis on image 36 of series 5 (formerly 7 mm). ---------------------  CT CHEST FINDINGS   Malignant left pleural effusion with thick multilobular rind of tumor subjectively stable from prior exam. Left anterior apical mass has similar morphology to the prior exam. There is considerable consolidation in the left lung, as before, with less than 1/3 of the left lung aerated. The amount of consolidation is mildly increased posteriorly. Epicardial extension of tumor in finger-like projections, similar to prior No overt rib destruction, although some of the lobulations of pleural tumor extend into the intercostal space likely with some degree of chest wall invasion as suggested on image 39 of series 2.  There is a small amount of pericardial fluid above the cardiac apex, with the medially adjacent epicardial tumor deposits.  Severe emphysema noted. 9 x 8 mm right middle lobe pulmonary nodule, mildly increased in size compared to prior.    Mr Lumbar Spine W Wo Contrast  08/01/2013   C  COMPARISON:  CT ABD/PELVIS W CM dated 07/14/2013; NM PET IMAGE INITIAL (PI) SKULL BASE TO THIGH dated 08/02/2012  FINDINGS: Five lumbar type vertebral bodies are assumed. There is a mild convex right scoliosis. The lateral alignment is normal. There is no evidence of acute fracture, pars defect or osseous metastatic disease. 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  The conus medullaris extends to the L1 level and appears normal. There is no abnormal intradural enhancement. Extensive enhancing pleural and extrapleural tumor in the lower left hemithorax is again noted. Left periaortic retroperitoneal lymphadenopathy appears grossly stable, measuring up to 1.5 cm short axis on axial image 14.  The lumbar discs are fairly well hydrated. There is mild disc bulging and facet hypertrophy. No disc herniation, spinal stenosis or nerve root encroachment is demonstrated.  IMPRESSION: 1. No evidence of metastatic disease to the spine or its intradural contents. No evidence of fracture or significant  malalignment. 2. 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 3. Extensive enhancing pleural/extrapleural tumor within the left hemithorax with grossly stable retroperitoneal lymphadenopathy. 4. Minimal spondylosis for age. No disc herniation, spinal stenosis or nerve root encroachment.   Electronically Signed   By: Camie Patience M.D.   On: 08/01/2013 15:01   Ct Abdomen Pelvis W Contrast  07/14/2013     CT ABDOMEN AND PELVIS FINDINGS  Diffuse hepatic steatosis. Gastrohepatic ligament lymph node, 1.0 cm in diameter, formerly 0.4 cm. Left retroperitoneal periaortic lymph node, 1.4 cm in short axis, formerly 1.3 cm. No adrenal  mass observed. No other adenopathy identified.  No specific gallbladder or biliary abnormality identified. Stable bilateral nonobstructive nephrolithiasis. Aortoiliac atherosclerosis. Prominent stool throughout the colon favors constipation. Bilateral inguinal hernias contain loops of small bowel without findings of strangulation or obstruction. Small umbilical hernia contains adipose tissue.  Mild asymmetry of the seminal vesicles, right larger than left, but without a definite prostate mass.  Bilaterally symmetric fusion of the sacroiliac joints. Interspinous ligament calcification with syndesmotic fight ankylosis in the spine.  IMPRESSION: 1. Minimal advancement of metastatic burden compared to prior exam. Suspected early chest wall invasion on the left. 2. Severe emphysema. 3. 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 4. Hepatic steatosis. 5.  Prominent stool throughout the colon favors constipation. 6. Bilateral inguinal hernias contain small bowel without strangulation or obstruction.   Electronically Signed   By: Sherryl Barters M.D.   On: 07/14/2013 16:50    Dg Chest Port 1 View  07/31/2013   CLINICAL DATA:  Increasing shortness of breath, chest pain, lung cancer, on chemotherapy,  history COPD, CHF, former smoker  EXAM: PORTABLE CHEST - 1 VIEW  COMPARISON:  CT chest 07/14/2013  FINDINGS: Subtotal opacification of the left hemi thorax by a combination of pleural effusion, increased atelectasis and tumor.  COPD changes with right basilar atelectasis versus scarring.  No definite right lung infiltrate.  Bones demineralized.  Cardiac margins obscured.  IMPRESSION: Marked opacification of the left hemi thorax by combination of tumor, left pleural effusion, and significant left lung atelectasis.  COPD changes with right basilar atelectasis versus scarring.   Electronically Signed   By: Lavonia Dana M.D.   On: 07/31/2013 11:24   US Thoracentesis Asp Pleural Space W/img Guide  08/02/2013   CLINICAL DATA:  Dyspnea, cough, stage IV metastatic lung carcinoma, recurrent left loculated pleural effusion; request is made for diagnostic and therapeutic left thoracentesis.  EXAM: ULTRASOUND GUIDED DIAGNOSTIC AND THERAPEUTIC LEFT  THORACENTESIS  COMPARISON:  PRIOR THORACENTESIS ON 04/27/2013.  PROCEDURE: An ultrasound guided thoracentesis was thoroughly discussed with the patient and questions answered. The benefits, risks, alternatives and complications were also discussed. The patient understands and wishes to proceed with the procedure. Written consent was obtained.  Ultrasound was performed to localize and mark an adequate pocket of fluid in the left chest. The area was then prepped and draped in the normal sterile fashion. 1% Lidocaine was used for local anesthesia. Under ultrasound guidance a 19 gauge Yueh catheter was introduced. Thoracentesis was performed. The catheter was removed and a dressing applied.  Complications:  none  FINDINGS: A total of approximately 350 cc's of blood-tinged fluid was removed. The fluid sample wassent for laboratory analysis.  IMPRESSION: Successful ultrasound guided diagnostic and therapeutic left thoracentesis yielding 350 cc's of pleural fluid.  Read by: Rowe Robert  ,P.A.-C.   Electronically Signed   By: Markus Daft M.D.   On: 08/02/2013 15:54       ASSESSMENT AND PLAN:   1. metastatic non-small cell lung cancer with evidence for recent disease progression with brain mets and effusion. S/p  first cycle of clinical trial with immunotherapy with Nivolumab on 07/19/2013. Current chemo plans on hold due to hospitalization.  2. Worsening dyspnea, likely secondary to lung Ca and COPD, improved with increase of prednisone to 40 mg po qd, nebs, Mucinex, morphine, O2 and Vanco/Cefepime. He is s/p diagnostic and therapeutic thoracentesis on 3/24 yielding 350 cc of blood tinged fluid.  Cytology pending.Appreciate Pulmonary and IM involvement  3.CHF,  on diuresis as per admitting team  4. Back pain, with negative MRI of L spine, unable to proceed with T spine per patient request.  5. Anemia, multifactorial, stable.   6. DVT prophylaxis, Lovenox  7. DNR.appreciate palliative care consultation on 08/01/2013  Other medical issues as per admitting team.   Rondel Jumbo, PA-C 08/03/2013, 7:45 AM  Hematology/oncology Attending: The patient is seen and examined today. I agree with the above note. He is a very pleasant 73 years old white male with metastatic non-small cell lung cancer, adenocarcinoma status post several chemotherapy regimen and recently started on immunotherapy clinical trial with Nivolumab status post 1 cycle. He was supposed to start cycle #2 yesterday but unfortunately the patient was admitted to [facility] with significant dyspnea secondary to COPD extirpation and collapse of the left lung. He was started on prednisone 40 mg by mouth daily and feeling much better. The patient was also covered with antibiotics in the form of vancomycin and cefepime for questionable pneumonia. He underwent therapeutic left sided thoracentesis with drainage of 350 cc of blood-tinged fluid. Luis Payne is feeling a little bit better today. We will continue the  current supportive care for now. His chemotherapy is on hold until after discharge from the hospital. Thank you so much for taking good care of LuisDalbert Batman. I will continue to follow up the patient with you and assist in his management on as needed basis.

## 2013-08-03 NOTE — Progress Notes (Addendum)
Came to visit patient on behalf of [facility] services. Referral received from COPD [facility] program. Explained services and patient's wife states "I do not think he needs those services". Reports he manages well at home and does not need the additional support/follow up from [facility]. Left [facility] brochure with wife and asked that they call if they should change their mind in the future. Will let RN case manager know patient declined [facility].  Marthenia Rolling, MSN- [facility] [phone]

## 2013-08-03 NOTE — Progress Notes (Signed)
PATIENT DETAILS Name: Luis Payne Age: 73 y.o. Sex: male Date of Birth: 1940-12-22 Admit Date: 07/31/2013 Admitting Physician Evalee Mutton Kristeen Mans, MD RCV:ELFYB,OFBPZWCH J., MD  Subjective: Still SOB, but slightly better after thoracentesis.  Assessment/Plan: Principal Problem: Complete opacification of the left lung  - Multifactorial-likely from mucous plugging, postobstructive pneumonia, worsening lung cancer, pleural effusion.  - Although requiring slightly more oxygen than usual baseline, he seems to be tolerating this rather well and is in not in any acute distress.  - Patient was admitted and started on  scheduled nebulized bronchodilators, Mucomyst nebulizer (now discontinued), Mucinex, empiric antibiotic therapy with Vanco/Cefepim-day 3 and chest physical therapy.Repeat CXR on 3/23 unchanged, PCCM recommending symp control with Morphine and follow palliative care consultation. 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 History of malignant pleural effusion  - S/P thoracentesis. 350cc removed. Will follow culture results.  . 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  -Oncology no recommending any further treatment; recommending hospice   . History of COPD  - Current shortness of breath likely secondary to above conditions rather than exacerbation of COPD.On chronic Prednisone, since still SOB and wheezing in right lung field today-will continue solumedrol one more day and start steroids tapering on 3/26  .History of malignant pleural effusion  - See above.   .Anemia, chronic disease  - Secondary to malignancy, continue to monitor CBC periodically  -no need for trnasfusion  . Hypertension  - Continue Coreg   . History of pulmonary embolism  - resume lovenox  Disposition: Remain inpatient, plan is to discharge home in 1-2 days with hospice following him  DVT Prophylaxis: On lovenox (will resume)  Code Status:  DNR  Family Communication Spouse and son  Procedures:  None  CONSULTS:  pulmonary/intensive care and hematology/oncology  Time spent >30 minutes-which includes 50% of the time with face-to-face with patient/ family and coordinating care related to the above assessment and plan.   MEDICATIONS: Scheduled Meds: . carvedilol  3.125 mg Oral BID  . ceFEPime (MAXIPIME) IV  1 g Intravenous Q12H  . digoxin  0.125 mg Oral Daily  . feeding supplement (ENSURE COMPLETE)  237 mL Oral BID BM  . folic acid  1 mg Oral Daily  . guaiFENesin  1,200 mg Oral BID  . heparin subcutaneous  5,000 Units Subcutaneous 3 times per day  . ipratropium  0.5 mg Nebulization Q6H  . levalbuterol  0.63 mg Nebulization Q6H  . methylPREDNISolone (SOLU-MEDROL) injection  40 mg Intravenous Q12H  . polyethylene glycol  17 g Oral BID  . senna-docusate  1 tablet Oral BID  . sodium chloride  3 mL Intravenous Q12H  . sodium chloride  3  mL Intravenous Q12H  . vancomycin  500 mg Intravenous Q12H   Continuous Infusions:  PRN Meds:.sodium chloride, acetaminophen, acetaminophen, albuterol, morphine CONCENTRATE,  ondansetron (ZOFRAN) IV, ondansetron, simethicone, sodium chloride, sodium phosphate  Antibiotics: Anti-infectives   Start     Dose/Rate Route Frequency Ordered Stop   08/01/13 0600  vancomycin (VANCOCIN) 500 mg in sodium chloride 0.9 % 100 mL IVPB     500 mg 100 mL/hr over 60 Minutes Intravenous Every 12 hours 07/31/13 1552     07/31/13 1600  vancomycin (VANCOCIN) IVPB 1000 mg/200 mL premix     1,000 mg 200 mL/hr over 60 Minutes Intravenous NOW 07/31/13 1552 07/31/13 1838   07/31/13 1600  ceFEPIme (MAXIPIME) 1 g in dextrose 5 % 50 mL IVPB     1 g 100 mL/hr over 30 Minutes Intravenous Every 12 hours 07/31/13 1552        PHYSICAL EXAM: Vital signs in last 24 hours: Filed Vitals:   08/03/13 1439 08/03/13 1509 08/03/13 1740 08/03/13 1900  BP:  127/62  140/60  Pulse:  109    Temp:  97.8 F (36.6 C)    TempSrc:  Oral    Resp:  20    Height:      Weight:      SpO2: 94% 93% 92%     Weight change:  Filed Weights   07/31/13 1500  Weight: 55.883 kg (123 lb 3.2 oz)   Body mass index is 20.5 kg/(m^2).   Gen Exam: Awake and alert with clear speech.   Neck: Supple, No JVD.   Chest: Some air entry in left upper lung; decreased BS otherwise on his Left lung field, right lung clear to auscultation CVS: S1 S2 Regular, no murmurs.  Abdomen: soft, BS +, non tender, non distended.  Extremities: no edema, lower extremities warm to touch. Neurologic: Non Focal.   Skin: No Rash.   Wounds: N/A.   Intake/Output from previous day:  Intake/Output Summary (Last 24 hours) at 08/03/13 2041 Last data filed at 08/03/13 1830  Gross per 24 hour  Intake    510 ml  Output   1800 ml  Net  -1290 ml    LAB RESULTS: CBC  Recent Labs Lab 07/31/13 1120 08/01/13 0411 08/03/13 0407  WBC 7.3 6.7 8.8  HGB 11.2* 10.3* 10.4*  HCT 34.5* 32.5* 32.0*  PLT 269 266 252  MCV 100.9* 101.2* 100.3*  MCH 32.7 32.1 32.6  MCHC 32.5 31.7 32.5  RDW 15.4 15.5 14.9  LYMPHSABS 0.5*  --   --   MONOABS 0.6   --   --   EOSABS 0.0  --   --   BASOSABS 0.0  --   --     Chemistries   Recent Labs Lab 07/31/13 1120 08/01/13 0411 08/03/13 0407  NA 140 137 136*  K 4.2 4.4 4.8  CL 98 96 93*  CO2 29 31 34*  GLUCOSE 106* 106* 146*  BUN 25* 24* 24*  CREATININE 0.92 0.99 0.92  CALCIUM 10.1 9.8 9.7   Cardiac Enzymes  Recent Labs Lab 07/31/13 1120  TROPONINI <0.30    MICROBIOLOGY: No results found for this or any previous visit (from the past 240 hour(s)).  RADIOLOGY STUDIES/RESULTS: Ct Chest Wo Contrast  07/31/2013   CLINICAL DATA:  Worsening shortness of breath. Currently undergoing chemotherapy for lung carcinoma.  EXAM: CT CHEST WITHOUT CONTRAST  TECHNIQUE: Multidetector CT imaging of the chest was performed following the standard protocol without IV contrast.  COMPARISON:  07/14/2013  FINDINGS: Comparison with previous study is somewhat limited by lack of intravenous contrast on current exam. Large malignant left pleural effusion with thick rind of tumor throughout the left pleural space shows no significant change in size. Compressive left lower lobe atelectasis again demonstrated. Left anterior apical lung mass with adjacent pneumonitis is also similar in size. Increased consolidation is seen in the left upper lobe and lingula when compared to previous study, with near complete opacification of the left lung now demonstrated.  Severe emphysema again seen involving the right lung, with large subpleural bleb again seen in the superior right lower lobe. 8 x 9 mm pulmonary nodule in the anterior right middle lobe on image 38 is stable in size. No new or enlarging pulmonary nodules or masses are seen in the right lung. No evidence of mediastinal or right hilar lymphadenopathy.  Visualized portions of the adrenal glands remain normal in appearance. Upper abdominal lymphadenopathy at the level of the celiac axis shows mild increase, with largest lymph node measuring 12 mm on image 51 compared to 10 mm  previously. No suspicious bone lesions identified.  IMPRESSION: Increased pulmonary consolidation seen in left upper lobe and lingula.  No significant change in large malignant left pleural effusion, pleural tumor, or mass seen anterior left lung apex.  Stable 9 mm right middle lobe nodule and severe emphysema.  Slight increase in upper abdominal lymphadenopathy adjacent celiac axis.   Electronically Signed   By: Earle Gell M.D.   On: 07/31/2013 14:54   Ct Chest W Contrast  07/14/2013   CLINICAL DATA:  Metastatic lung cancer.  Recist 1.1.  EXAM: CT CHEST, ABDOMEN, AND PELVIS WITH CONTRAST RECIST RESEARCH SCORING  TECHNIQUE: Multidetector CT imaging of the chest, abdomen and pelvis was performed following the standard protocol during bolus administration of intravenous contrast.  CONTRAST:  129mL OMNIPAQUE IOHEXOL 300 MG/ML  SOLN  COMPARISON:  CT ABD/PELVIS W CM dated 06/15/2013; CT ANGIO CHEST W/CM &/OR WO/CM dated 05/02/2013; CT CHEST W/CM dated 04/18/2013; DG CHEST 2 VIEW dated 06/18/2013  FINDINGS: RECIST 1.1  Target Lesions:  1. Left anterior lung apex mass, 3.4 cm in long axis on image 10 of series 2 (formerly 3.1 cm). 2. Left anterior pleural/epicardial metastatic focus, 2.9 cm in the long axis on image 42 of series 2 (formerly 2.9 cm). 3. Left periaortic lymph node, 1.4 cm in short axis on image 57 of series 2 (formerly 1.3 cm). Non-target Lesions:  1. Right middle lobe pulmonary nodule, 9 mm in long axis on image 36 of series 5 (formerly 7 mm). ---------------------  CT CHEST FINDINGS  Malignant left pleural effusion with thick multilobular rind of tumor subjectively stable from prior exam. Left anterior apical mass has similar morphology to the prior exam. There is considerable consolidation in the left lung, as before, with less than 1/3 of the left lung aerated. The amount of consolidation is mildly increased posteriorly. Epicardial extension of tumor in finger-like projections, similar to prior No overt  rib destruction, although some of the lobulations of pleural tumor extend into the intercostal space likely with some degree of chest wall invasion as suggested on image 39 of series 2.  There is a small amount of pericardial fluid above the cardiac apex, with the medially adjacent epicardial tumor deposits.  Severe emphysema noted. 9 x 8 mm right middle lobe pulmonary nodule, mildly increased in size compared to prior.  CT ABDOMEN AND PELVIS FINDINGS  Diffuse hepatic steatosis. Gastrohepatic ligament lymph node,  1.0 cm in diameter, formerly 0.4 cm. Left retroperitoneal periaortic lymph node, 1.4 cm in short axis, formerly 1.3 cm. No adrenal mass observed. No other adenopathy identified.  No specific gallbladder or biliary abnormality identified. Stable bilateral nonobstructive nephrolithiasis. Aortoiliac atherosclerosis. Prominent stool throughout the colon favors constipation. Bilateral inguinal hernias contain loops of small bowel without findings of strangulation or obstruction. Small umbilical hernia contains adipose tissue.  Mild asymmetry of the seminal vesicles, right larger than left, but without a definite prostate mass.  Bilaterally symmetric fusion of the sacroiliac joints. Interspinous ligament calcification with syndesmotic fight ankylosis in the spine.  IMPRESSION: 1. Minimal advancement of metastatic burden compared to prior exam. Suspected early chest wall invasion on the left. 2. Severe emphysema. 3. 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 4. Hepatic steatosis. 5.  Prominent stool throughout the colon favors constipation. 6. Bilateral inguinal hernias contain small bowel without strangulation or obstruction.   Electronically Signed   By: Sherryl Barters M.D.   On: 07/14/2013 16:50   Ct Abdomen Pelvis W Contrast  07/14/2013   CLINICAL DATA:  Metastatic lung cancer.  Recist 1.1.  EXAM: CT CHEST, ABDOMEN, AND PELVIS WITH CONTRAST RECIST RESEARCH SCORING   TECHNIQUE: Multidetector CT imaging of the chest, abdomen and pelvis was performed following the standard protocol during bolus administration of intravenous contrast.  CONTRAST:  117mL OMNIPAQUE IOHEXOL 300 MG/ML  SOLN  COMPARISON:  CT ABD/PELVIS W CM dated 06/15/2013; CT ANGIO CHEST W/CM &/OR WO/CM dated 05/02/2013; CT CHEST W/CM dated 04/18/2013; DG CHEST 2 VIEW dated 06/18/2013  FINDINGS: RECIST 1.1  Target Lesions:  1. Left anterior lung apex mass, 3.4 cm in long axis on image 10 of series 2 (formerly 3.1 cm). 2. Left anterior pleural/epicardial metastatic focus, 2.9 cm in the long axis on image 42 of series 2 (formerly 2.9 cm). 3. Left periaortic lymph node, 1.4 cm in short axis on image 57 of series 2 (formerly 1.3 cm). Non-target Lesions:  1. Right middle lobe pulmonary nodule, 9 mm in long axis on image 36 of series 5 (formerly 7 mm). ---------------------  CT CHEST FINDINGS  Malignant left pleural effusion with thick multilobular rind of tumor subjectively stable from prior exam. Left anterior apical mass has similar morphology to the prior exam. There is considerable consolidation in the left lung, as before, with less than 1/3 of the left lung aerated. The amount of consolidation is mildly increased posteriorly. Epicardial extension of tumor in finger-like projections, similar to prior No overt rib destruction, although some of the lobulations of pleural tumor extend into the intercostal space likely with some degree of chest wall invasion as suggested on image 39 of series 2.  There is a small amount of pericardial fluid above the cardiac apex, with the medially adjacent epicardial tumor deposits.  Severe emphysema noted. 9 x 8 mm right middle lobe pulmonary nodule, mildly increased in size compared to prior.  CT ABDOMEN AND PELVIS FINDINGS  Diffuse hepatic steatosis. Gastrohepatic ligament lymph node, 1.0 cm in diameter, formerly 0.4 cm. Left retroperitoneal periaortic lymph node, 1.4 cm in short axis,  formerly 1.3 cm. No adrenal mass observed. No other adenopathy identified.  No specific gallbladder or biliary abnormality identified. Stable bilateral nonobstructive nephrolithiasis. Aortoiliac atherosclerosis. Prominent stool throughout the colon favors constipation. Bilateral inguinal hernias contain loops of small bowel without findings of strangulation or obstruction. Small umbilical hernia contains adipose tissue.  Mild asymmetry of the seminal vesicles, right larger than left,  but without a definite prostate mass.  Bilaterally symmetric fusion of the sacroiliac joints. Interspinous ligament calcification with syndesmotic fight ankylosis in the spine.  IMPRESSION: 1. Minimal advancement of metastatic burden compared to prior exam. Suspected early chest wall invasion on the left. 2. Severe emphysema. 3. 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 4. Hepatic steatosis. 5.  Prominent stool throughout the colon favors constipation. 6. Bilateral inguinal hernias contain small bowel without strangulation or obstruction.   Electronically Signed   By: Sherryl Barters M.D.   On: 07/14/2013 16:50   Portable Chest 1 View  08/01/2013   CLINICAL DATA:  Left hemithorax.  EXAM: PORTABLE CHEST - 1 VIEW  COMPARISON:  Chest x-ray 07/31/2013.  FINDINGS: Near complete opacification of the left hemithorax is similar to recent prior examinations, compatible with a combination of left lung consolidation, left lung atelectasis, and large left pleural effusion, better demonstrated on recent chest CT 07/31/2013. Interstitial prominence throughout the right lung is noted, superimposed upon underlying emphysematous changes. Cardiomediastinal silhouette is largely obscured.  IMPRESSION: 1. The appearance the chest is similar to recent prior examinations, again demonstrating near complete opacification of the left hemithorax, as discussed above.   Electronically Signed   By: Vinnie Langton M.D.    On: 08/01/2013 06:52   Dg Chest Port 1 View  07/31/2013   CLINICAL DATA:  Increasing shortness of breath, chest pain, lung cancer, on chemotherapy, history COPD, CHF, former smoker  EXAM: PORTABLE CHEST - 1 VIEW  COMPARISON:  CT chest 07/14/2013  FINDINGS: Subtotal opacification of the left hemi thorax by a combination of pleural effusion, increased atelectasis and tumor.  COPD changes with right basilar atelectasis versus scarring.  No definite right lung infiltrate.  Bones demineralized.  Cardiac margins obscured.  IMPRESSION: Marked opacification of the left hemi thorax by combination of tumor, left pleural effusion, and significant left lung atelectasis.  COPD changes with right basilar atelectasis versus scarring.   Electronically Signed   By: Lavonia Dana M.D.   On: 07/31/2013 11:24    Barton Dubois, MD  [facility] Pager:[phone]  If 7PM-7AM, please contact night-coverage [email or web address] Password [facility] 08/03/2013, 8:41 PM   LOS: 3 days

## 2013-08-04 DIAGNOSIS — J9600 Acute respiratory failure, unspecified whether with hypoxia or hypercapnia: Secondary | ICD-10-CM

## 2013-08-04 DIAGNOSIS — G893 Neoplasm related pain (acute) (chronic): Secondary | ICD-10-CM

## 2013-08-04 MED ORDER — OXYCODONE-ACETAMINOPHEN 10-325 MG PO TABS: 1.0000 | ORAL_TABLET | ORAL | Status: DC | PRN

## 2013-08-04 MED ORDER — AMOXICILLIN-POT CLAVULANATE 500-125 MG PO TABS: 1.0000 | ORAL_TABLET | Freq: Three times a day (TID) | ORAL | Status: DC

## 2013-08-04 MED ORDER — BUDESONIDE 0.25 MG/2ML IN SUSP: 0.2500 mg | Freq: Two times a day (BID) | RESPIRATORY_TRACT | Status: DC

## 2013-08-04 MED ORDER — POLYETHYLENE GLYCOL 3350 17 G PO PACK: 17.0000 g | PACK | Freq: Two times a day (BID) | ORAL | Status: DC

## 2013-08-04 MED ORDER — ENOXAPARIN SODIUM 60 MG/0.6ML ~~LOC~~ SOLN: 1.0000 mg/kg | SUBCUTANEOUS | Status: DC

## 2013-08-04 MED ORDER — MORPHINE SULFATE (CONCENTRATE) 10 MG /0.5 ML PO SOLN: 10.0000 mg | ORAL | Status: DC | PRN

## 2013-08-04 MED ORDER — FUROSEMIDE 20 MG PO TABS: 20.0000 mg | ORAL_TABLET | Freq: Every day | ORAL | Status: DC

## 2013-08-04 MED ORDER — PREDNISONE 10 MG PO TABS: ORAL_TABLET | ORAL | Status: DC

## 2013-08-04 NOTE — Progress Notes (Signed)
Spoke with pt and wife at bedside concerning home with Hospice. They selected [facility], referral given with information faxed [phone].

## 2013-08-04 NOTE — Progress Notes (Signed)
Spoke with pt and wife at bedside concerning Hospice. They decided not to go with Hospice related to pt wanting to continue with chemo. Wife asked for Hospice Bed and selected [facility]. Pt's wife asked that Hospital bed be delivered on Sat. 3/28. Referral given.

## 2013-08-04 NOTE — Progress Notes (Signed)
[facility] is providing the following services: Hospital Bed  If patient discharges after hours, please call [phone].   Linward Headland 08/04/2013, 4:02 PM

## 2013-08-04 NOTE — Discharge Summary (Signed)
Physician Discharge Summary  KOSTAS MARROW UMP:[id number] DOB: [birth year] DOA: 07/31/2013  PCP: Glo Herring., MD  Admit date: 07/31/2013 Discharge date: 08/04/2013  Time spent: >30 minutes  Recommendations for Outpatient Follow-up:  1. BMET to follow electrolytes and renal function. 2. Oncology follow up to decide on further treatment for his metastatic lung cancer.  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 Discharge home with antibiotics regimen and appointment with CVTS for decision on pleur-dx catheter. Follow up with PCP and Oncology as an outpatient.   Diet recommendation: heart healthy diet  Filed Weights   07/31/13 1500  Weight: 55.883 kg (123 lb 3.2 oz)    History of present illness:  73 y.o. male with a Past Medical History metastatic lung cancer, COPD, chronic respiratory failure on home O2 4 L/minute who presents today with the above noted complaint. Per patient, he was in his usual state of health a few days ago, he then noted that he was getting much more short of breath than usual. He also complains of cough with yellow phlegm. Denies any fever. Denies any ongoing chest pain abdominal pain. Because progressively worsened shortness of breath, patient increase his oxygen from 4 to 5 L. He subsequently presented to the emergency room, where he was found to have complete opacification of his left lung on chest x-ray. I was subsequently asked to admit this patient for further evaluation and treatment.  Patient denies any headache, fever, nausea, vomiting, diarrhea, chest pain, abdominal pain.  Hospital  Course:  Complete opacification of the left lung  - Multifactorial-likely from mucous plugging, postobstructive pneumonia, worsening lung cancer, pleural effusion.  - Although requiring slightly more oxygen than usual baseline, he seems to be tolerating this rather well and is in not in any acute distress.  - Patient was admitted and started on scheduled nebulized bronchodilators, Mucomyst nebulizer (now discontinued), Mucinex, empiric antibiotic therapy with Vanco/Cefepim-day 3 and chest physical therapy.Repeat CXR on 3/23 unchanged, PCCM recommending symp control with Morphine and follow palliative care consultation.  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 -patient will meet with oncology as an outpatient and discussed further treatment options.  . History of malignant pleural effusion  -S/P thoracentesis. 350cc removed.  -Will follow culture results.   . 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  -Oncology no recommending  any further treatment at this moment and will re-meet as an outpatient for further treatment decision.   Marland Kitchen History of COPD  -  Current shortness of breath likely secondary to above conditions rather than exacerbation of COPD.On chronic Prednisone, since still SOB and wheezing in right lung field patient received treatment with solumedrol and nebulizer while inpatient. Once breathing/wheezing improved, he was discharge with steroids tapering and Pulmicort. Will also be on antibiotics for obstructive PNA.  Marland KitchenHistory of malignant pleural effusion  -status post thoracentesis   -plan is for follow up as an outpatient with CVTS for possible pleurodex catheter placement  .Anemia, chronic disease  - Secondary to malignancy, continue to monitor CBC periodically  -no need for transfusion during this admisison    . Hypertension  - Continue Coreg   . History of pulmonary embolism  - will continue lovenox   Procedures:  Thoracentesis (3/24) approx 350cc removed  Consultations:  Pulmonary service  Oncology   Discharge Exam: Filed Vitals:   08/04/13 1445  BP: 124/57  Pulse: 103  Temp: 98.3 F (36.8 C)  Resp: 19   Gen Exam: Awake and alert with clear speech. Feeling better Neck: Supple, No JVD.  Chest: Some air entry in left upper lung; decreased BS otherwise on his Left lung field, right lung clear to auscultation  CVS: S1 S2 Regular, no murmurs.  Abdomen: soft, BS +, non tender, non distended.  Extremities: no edema, lower extremities warm to touch.  Neurologic: Non Focal.  Skin: No Rash.  Wounds: N/A.    Discharge Instructions  Discharge Orders   Future Appointments Provider Department Dept Phone   08/15/2013 10:45 AM Lora Paula, MD [facility] [phone]   08/16/2013 9:30 AM [facility] [phone]   08/16/2013 10:00 AM Curt Bears, MD [facility] [phone]   08/16/2013 11:00 AM [facility] [phone]   08/30/2013 11:00 AM  [facility] [phone]   09/16/2013 10:00 AM Burtis Junes, NP [facility] [phone]   Future Orders Complete By Expires   Discharge instructions  As directed    Comments:     Take medications as prescribed Follow with cardio thoracic surgery service as instructed (office will call with appointment details) Hospice will follow with you for further treatment and to have equipment delivered Follow with Dr. Julien Nordmann as an outpatient as previously instructed Continue oxygen supplementation 4L 24 hrs a day       Medication List    STOP taking these medications       aspirin 81 MG tablet      TAKE these medications       acetaminophen 650 MG CR tablet  Commonly known as:  TYLENOL  Take 1,300 mg by mouth every 8 (eight) hours as needed for pain.     amoxicillin-clavulanate 500-125 MG per tablet  Commonly known as:  AUGMENTIN  Take 1 tablet (500 mg total) by mouth 3 (three) times daily.     budesonide 0.25 MG/2ML nebulizer solution  Commonly known as:  PULMICORT  Take 2 mLs (0.25 mg total) by nebulization every 12 (twelve) hours.     carvedilol 3.125 MG tablet  Commonly known as:  COREG  Take 1 tablet (3.125 mg total) by mouth 2 (two) times daily.     digoxin 0.125 MG tablet  Commonly known as:  LANOXIN  Take 1 tablet (0.125 mg total) by mouth daily.     enoxaparin 60 MG/0.6ML injection  Commonly known as:  LOVENOX  Inject 0.6 mLs (60 mg total) into the skin daily.     feeding supplement (ENSURE COMPLETE) Liqd  Take 237 mLs by mouth 2 (two) times daily between meals.     folic acid 1 MG tablet  Commonly known as:  FOLVITE  Take 1 mg by mouth daily.     furosemide 20 MG tablet  Commonly known as:  LASIX  Take 1 tablet (20 mg total) by mouth daily.     guaiFENesin 600 MG 12 hr tablet  Commonly known as:  MUCINEX  Take 600 mg by mouth daily.     levalbuterol 0.63 MG/3ML nebulizer solution   Commonly known as:  XOPENEX  Take 3 mLs (0.63 mg total) by nebulization every 4 (four) hours as needed for wheezing.     morphine CONCENTRATE 10 mg / 0.5 ml concentrated solution  Take 0.5 mLs (10 mg total) by mouth every 4 (four) hours as needed for severe pain.     oxyCODONE-acetaminophen 10-325 MG per tablet  Commonly known as:  PERCOCET  Take 1 tablet by mouth every 4 (four) hours as needed (for breakthrough pain).     OXYGEN-HELIUM IN  Inhale 4 L into the lungs.     polyethylene glycol packet  Commonly known as:  MIRALAX / GLYCOLAX  Take 17 g by mouth 2 (two) times daily.     predniSONE 10 MG tablet  Commonly known as:  DELTASONE  Take 5 tablets by mouth daily X 1 day; then 4 tablets by mouth daily X 2 days; then 3 tablets by mouth daily X 2 days; then 2 tablets by mouth daily X 2 days and then start taking 1 tablet by mouth daily.     PRESCRIPTION MEDICATION  Chemo-Dr Julien Nordmann .Nivolumab infusion     senna-docusate 8.6-50 MG per tablet  Commonly known as:  Senokot-S  Take 1 tablet by mouth daily.     vitamin C 1000 MG tablet  Take 1,000 mg by mouth daily.       No Known Allergies     Follow-up Information   Follow up with Glo Herring., MD In 10 days.   Specialty:  Internal Medicine   Contact information:   [address] [place] [address] [phone]       Call Eilleen Kempf., MD. (office to confirmed appointment details )    Specialty:  Oncology   Contact information:   [place] [address] [phone]       Follow up with [facility]. (office will contact you with appointment details )    Specialty:  Cardiothoracic Surgery   Contact information:   [place], [place] [address] [phone]      The results of significant diagnostics from this hospitalization (including imaging, microbiology, ancillary and laboratory) are listed  below for reference.    Significant Diagnostic Studies: Dg Chest 1 View  08/02/2013   CLINICAL DATA:  Status post left thoracentesis  EXAM: CHEST - 1 VIEW  COMPARISON:  Graft from 08/01/2013  FINDINGS: Cardiac and mediastinal silhouettes remain largely obscured.  Again seen is near complete opacification of the left hemi thorax, compatible with large left pleural effusion. There slightly improved aeration within the upper and mid left lung. No pneumothorax identified status post thoracentesis.  Linear opacity at the right lung base most compatible with atelectasis.  No acute osseus abnormality.  IMPRESSION: 1. Persistent large left pleural effusion, slightly decreased in size status post interval left thoracentesis. No pneumothorax identified. 2. Slight interval improvement in aeration of the left upper and mid lung.   Electronically Signed   By: Jeannine Boga M.D.   On: 08/02/2013 15:51   Ct Chest Wo Contrast  07/31/2013   CLINICAL DATA:  Worsening shortness of breath. Currently undergoing chemotherapy for lung carcinoma.  EXAM: CT CHEST WITHOUT CONTRAST  TECHNIQUE: Multidetector CT imaging of the chest was performed following the standard protocol without IV contrast.  COMPARISON:  07/14/2013  FINDINGS: Comparison with previous study is somewhat limited by lack of intravenous contrast on current exam. Large malignant left pleural effusion with thick rind of tumor throughout the left pleural space shows no significant change in size. Compressive left lower lobe atelectasis again demonstrated. Left anterior apical lung mass with adjacent pneumonitis is also similar in size. Increased consolidation is seen in the left upper lobe and lingula when compared to previous study, with near complete opacification of the left lung now demonstrated.  Severe emphysema again seen involving the right lung, with large subpleural bleb again seen in the superior right lower lobe. 8 x 9 mm pulmonary nodule in the  anterior right middle lobe on image 38 is stable in size. No new or enlarging pulmonary nodules or masses are seen in the right lung. No evidence of mediastinal or right hilar lymphadenopathy.  Visualized portions of the adrenal glands remain normal in appearance. Upper abdominal lymphadenopathy at the level of the celiac axis shows mild increase, with largest lymph node measuring 12 mm on image 51 compared to 10 mm previously. No suspicious bone lesions identified.  IMPRESSION: Increased pulmonary consolidation seen in left upper lobe and lingula.  No significant change in large malignant left pleural effusion, pleural tumor, or mass seen anterior left lung apex.  Stable 9 mm right middle lobe nodule and severe emphysema.  Slight increase in upper abdominal lymphadenopathy adjacent celiac axis.   Electronically Signed   By: Earle Gell M.D.   On: 07/31/2013 14:54   Ct Chest W Contrast  07/14/2013   CLINICAL DATA:  Metastatic lung cancer.  Recist 1.1.  EXAM: CT CHEST, ABDOMEN, AND PELVIS WITH CONTRAST RECIST RESEARCH SCORING  TECHNIQUE: Multidetector CT imaging of the chest, abdomen and pelvis was performed following the standard protocol during bolus administration of intravenous contrast.  CONTRAST:  126mL OMNIPAQUE IOHEXOL 300 MG/ML  SOLN  COMPARISON:  CT ABD/PELVIS W CM dated 06/15/2013; CT ANGIO CHEST W/CM &/OR WO/CM dated 05/02/2013; CT CHEST W/CM dated 04/18/2013; DG CHEST 2 VIEW dated 06/18/2013  FINDINGS: RECIST 1.1  Target Lesions:  1. Left anterior lung apex mass, 3.4 cm in long axis on image 10 of series 2 (formerly 3.1 cm). 2. Left anterior pleural/epicardial metastatic focus, 2.9 cm in the long axis on image 42 of series 2 (formerly 2.9 cm). 3. Left periaortic lymph node, 1.4 cm in short axis on image 57 of series 2 (formerly 1.3 cm). Non-target Lesions:  1. Right middle lobe pulmonary nodule, 9 mm in long axis on image 36 of series 5 (formerly 7 mm). ---------------------  CT CHEST FINDINGS  Malignant  left pleural effusion with thick multilobular rind of tumor subjectively stable from prior exam. Left anterior apical mass has similar morphology to the prior exam. There is considerable consolidation in the left lung, as before,  with less than 1/3 of the left lung aerated. The amount of consolidation is mildly increased posteriorly. Epicardial extension of tumor in finger-like projections, similar to prior No overt rib destruction, although some of the lobulations of pleural tumor extend into the intercostal space likely with some degree of chest wall invasion as suggested on image 39 of series 2.  There is a small amount of pericardial fluid above the cardiac apex, with the medially adjacent epicardial tumor deposits.  Severe emphysema noted. 9 x 8 mm right middle lobe pulmonary nodule, mildly increased in size compared to prior.  CT ABDOMEN AND PELVIS FINDINGS  Diffuse hepatic steatosis. Gastrohepatic ligament lymph node, 1.0 cm in diameter, formerly 0.4 cm. Left retroperitoneal periaortic lymph node, 1.4 cm in short axis, formerly 1.3 cm. No adrenal mass observed. No other adenopathy identified.  No specific gallbladder or biliary abnormality identified. Stable bilateral nonobstructive nephrolithiasis. Aortoiliac atherosclerosis. Prominent stool throughout the colon favors constipation. Bilateral inguinal hernias contain loops of small bowel without findings of strangulation or obstruction. Small umbilical hernia contains adipose tissue.  Mild asymmetry of the seminal vesicles, right larger than left, but without a definite prostate mass.  Bilaterally symmetric fusion of the sacroiliac joints. Interspinous ligament calcification with syndesmotic fight ankylosis in the spine.  IMPRESSION: 1. Minimal advancement of metastatic burden compared to prior exam. Suspected early chest wall invasion on the left. 2. Severe emphysema. 3. 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 4.  Hepatic steatosis. 5.  Prominent stool throughout the colon favors constipation. 6. Bilateral inguinal hernias contain small bowel without strangulation or obstruction.   Electronically Signed   By: Sherryl Barters M.D.   On: 07/14/2013 16:50   Mr Lumbar Spine W Wo Contrast  08/01/2013   CLINICAL DATA:  Stage IV lung cancer.  Possible bone metastases.  EXAM: MRI LUMBAR SPINE WITHOUT AND WITH CONTRAST  TECHNIQUE: Multiplanar and multiecho pulse sequences of the lumbar spine were obtained without and with intravenous contrast.  CONTRAST:  77mL MULTIHANCE GADOBENATE DIMEGLUMINE 529 MG/ML IV SOLN  COMPARISON:  CT ABD/PELVIS W CM dated 07/14/2013; NM PET IMAGE INITIAL (PI) SKULL BASE TO THIGH dated 08/02/2012  FINDINGS: Five lumbar type vertebral bodies are assumed. There is a mild convex right scoliosis. The lateral alignment is normal. There is no evidence of acute fracture, pars defect or osseous metastatic disease. 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  The conus medullaris extends to the L1 level and appears normal. There is no abnormal intradural enhancement. Extensive enhancing pleural and extrapleural tumor in the lower left hemithorax is again noted. Left periaortic retroperitoneal lymphadenopathy appears grossly stable, measuring up to 1.5 cm short axis on axial image 14.  The lumbar discs are fairly well hydrated. There is mild disc bulging and facet hypertrophy. No disc herniation, spinal stenosis or nerve root encroachment is demonstrated.  IMPRESSION: 1. No evidence of metastatic disease to the spine or its intradural contents. No evidence of fracture or significant malalignment. 2. 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 3. Extensive enhancing pleural/extrapleural tumor within the left hemithorax with grossly stable retroperitoneal lymphadenopathy. 4. Minimal spondylosis for age. No disc herniation, spinal  stenosis or nerve root encroachment.   Electronically Signed   By: Camie Patience M.D.   On: 08/01/2013 15:01   Ct Abdomen Pelvis W Contrast  07/14/2013   CLINICAL DATA:  Metastatic lung cancer.  Recist 1.1.  EXAM: CT CHEST,  ABDOMEN, AND PELVIS WITH CONTRAST RECIST RESEARCH SCORING  TECHNIQUE: Multidetector CT imaging of the chest, abdomen and pelvis was performed following the standard protocol during bolus administration of intravenous contrast.  CONTRAST:  179mL OMNIPAQUE IOHEXOL 300 MG/ML  SOLN  COMPARISON:  CT ABD/PELVIS W CM dated 06/15/2013; CT ANGIO CHEST W/CM &/OR WO/CM dated 05/02/2013; CT CHEST W/CM dated 04/18/2013; DG CHEST 2 VIEW dated 06/18/2013  FINDINGS: RECIST 1.1  Target Lesions:  1. Left anterior lung apex mass, 3.4 cm in long axis on image 10 of series 2 (formerly 3.1 cm). 2. Left anterior pleural/epicardial metastatic focus, 2.9 cm in the long axis on image 42 of series 2 (formerly 2.9 cm). 3. Left periaortic lymph node, 1.4 cm in short axis on image 57 of series 2 (formerly 1.3 cm). Non-target Lesions:  1. Right middle lobe pulmonary nodule, 9 mm in long axis on image 36 of series 5 (formerly 7 mm). ---------------------  CT CHEST FINDINGS  Malignant left pleural effusion with thick multilobular rind of tumor subjectively stable from prior exam. Left anterior apical mass has similar morphology to the prior exam. There is considerable consolidation in the left lung, as before, with less than 1/3 of the left lung aerated. The amount of consolidation is mildly increased posteriorly. Epicardial extension of tumor in finger-like projections, similar to prior No overt rib destruction, although some of the lobulations of pleural tumor extend into the intercostal space likely with some degree of chest wall invasion as suggested on image 39 of series 2.  There is a small amount of pericardial fluid above the cardiac apex, with the medially adjacent epicardial tumor deposits.  Severe emphysema noted. 9 x 8  mm right middle lobe pulmonary nodule, mildly increased in size compared to prior.  CT ABDOMEN AND PELVIS FINDINGS  Diffuse hepatic steatosis. Gastrohepatic ligament lymph node, 1.0 cm in diameter, formerly 0.4 cm. Left retroperitoneal periaortic lymph node, 1.4 cm in short axis, formerly 1.3 cm. No adrenal mass observed. No other adenopathy identified.  No specific gallbladder or biliary abnormality identified. Stable bilateral nonobstructive nephrolithiasis. Aortoiliac atherosclerosis. Prominent stool throughout the colon favors constipation. Bilateral inguinal hernias contain loops of small bowel without findings of strangulation or obstruction. Small umbilical hernia contains adipose tissue.  Mild asymmetry of the seminal vesicles, right larger than left, but without a definite prostate mass.  Bilaterally symmetric fusion of the sacroiliac joints. Interspinous ligament calcification with syndesmotic fight ankylosis in the spine.  IMPRESSION: 1. Minimal advancement of metastatic burden compared to prior exam. Suspected early chest wall invasion on the left. 2. Severe emphysema. 3. 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 4. Hepatic steatosis. 5.  Prominent stool throughout the colon favors constipation. 6. Bilateral inguinal hernias contain small bowel without strangulation or obstruction.   Electronically Signed   By: Sherryl Barters M.D.   On: 07/14/2013 16:50   Portable Chest 1 View  08/01/2013   CLINICAL DATA:  Left hemithorax.  EXAM: PORTABLE CHEST - 1 VIEW  COMPARISON:  Chest x-ray 07/31/2013.  FINDINGS: Near complete opacification of the left hemithorax is similar to recent prior examinations, compatible with a combination of left lung consolidation, left lung atelectasis, and large left pleural effusion, better demonstrated on recent chest CT 07/31/2013. Interstitial prominence throughout the right lung is noted, superimposed upon underlying emphysematous  changes. Cardiomediastinal silhouette is largely obscured.  IMPRESSION: 1. The appearance the chest is similar to recent prior examinations, again demonstrating near complete opacification of the  left hemithorax, as discussed above.   Electronically Signed   By: Vinnie Langton M.D.   On: 08/01/2013 06:52   Dg Chest Port 1 View  07/31/2013   CLINICAL DATA:  Increasing shortness of breath, chest pain, lung cancer, on chemotherapy, history COPD, CHF, former smoker  EXAM: PORTABLE CHEST - 1 VIEW  COMPARISON:  CT chest 07/14/2013  FINDINGS: Subtotal opacification of the left hemi thorax by a combination of pleural effusion, increased atelectasis and tumor.  COPD changes with right basilar atelectasis versus scarring.  No definite right lung infiltrate.  Bones demineralized.  Cardiac margins obscured.  IMPRESSION: Marked opacification of the left hemi thorax by combination of tumor, left pleural effusion, and significant left lung atelectasis.  COPD changes with right basilar atelectasis versus scarring.   Electronically Signed   By: Lavonia Dana M.D.   On: 07/31/2013 11:24   US Thoracentesis Asp Pleural Space W/img Guide  08/02/2013   CLINICAL DATA:  Dyspnea, cough, stage IV metastatic lung carcinoma, recurrent left loculated pleural effusion; request is made for diagnostic and therapeutic left thoracentesis.  EXAM: ULTRASOUND GUIDED DIAGNOSTIC AND THERAPEUTIC LEFT  THORACENTESIS  COMPARISON:  PRIOR THORACENTESIS ON 04/27/2013.  PROCEDURE: An ultrasound guided thoracentesis was thoroughly discussed with the patient and questions answered. The benefits, risks, alternatives and complications were also discussed. The patient understands and wishes to proceed with the procedure. Written consent was obtained.  Ultrasound was performed to localize and mark an adequate pocket of fluid in the left chest. The area was then prepped and draped in the normal sterile fashion. 1% Lidocaine was used for local anesthesia. Under  ultrasound guidance a 19 gauge Yueh catheter was introduced. Thoracentesis was performed. The catheter was removed and a dressing applied.  Complications:  none  FINDINGS: A total of approximately 350 cc's of blood-tinged fluid was removed. The fluid sample wassent for laboratory analysis.  IMPRESSION: Successful ultrasound guided diagnostic and therapeutic left thoracentesis yielding 350 cc's of pleural fluid.  Read by: Rowe Robert ,P.A.-C.   Electronically Signed   By: Markus Daft M.D.   On: 08/02/2013 15:54   Labs: Basic Metabolic Panel:  Recent Labs Lab 07/31/13 1120 08/01/13 0411 08/03/13 0407  NA 140 137 136*  K 4.2 4.4 4.8  CL 98 96 93*  CO2 29 31 34*  GLUCOSE 106* 106* 146*  BUN 25* 24* 24*  CREATININE 0.92 0.99 0.92  CALCIUM 10.1 9.8 9.7   Liver Function Tests:  Recent Labs Lab 07/31/13 1120  AST 22  ALT 12  ALKPHOS 116  BILITOT 0.3  PROT 6.8  ALBUMIN 2.8*   CBC:  Recent Labs Lab 07/31/13 1120 08/01/13 0411 08/03/13 0407  WBC 7.3 6.7 8.8  NEUTROABS 6.2  --   --   HGB 11.2* 10.3* 10.4*  HCT 34.5* 32.5* 32.0*  MCV 100.9* 101.2* 100.3*  PLT 269 266 252   Cardiac Enzymes:  Recent Labs Lab 07/31/13 1120  TROPONINI <0.30   BNP: BNP (last 3 results)  Recent Labs  06/18/13 1041 07/31/13 1120 08/01/13 0411  PROBNP 298.9* 378.1* 422.0*    Signed:  Barton Dubois  [facility] 08/04/2013, 3:02 PM

## 2013-08-04 NOTE — Progress Notes (Signed)
PULMONARY  / CRITICAL CARE MEDICINE  Name: Luis Payne MRN: [id number] DOB: [birth year] PCP Glo Herring., MD    ADMISSION DATE:  07/31/2013  LOS 4 days   CONSULTATION DATE:  08/01/13  REFERRING MD :  [facility] PRIMARY SERVICE: [facility]  CHIEF COMPLAINT:  Dyspnea  BRIEF PATIENT DESCRIPTION: see HPI  SIGNIFICANT EVENTS / STUDIES:  07/31/2013 - admit 3/25- Pain better with oral morphine. Breathing better post thora 3/24 350 cc obtained.  LINES / TUBES: none  CULTURES: none  ANTIBIOTICS: Anti-infectives   Start     Dose/Rate Route Frequency Ordered Stop   08/01/13 0600  vancomycin (VANCOCIN) 500 mg in sodium chloride 0.9 % 100 mL IVPB     500 mg 100 mL/hr over 60 Minutes Intravenous Every 12 hours 07/31/13 1552     07/31/13 1600  vancomycin (VANCOCIN) IVPB 1000 mg/200 mL premix     1,000 mg 200 mL/hr over 60 Minutes Intravenous NOW 07/31/13 1552 07/31/13 1838   07/31/13 1600  ceFEPIme (MAXIPIME) 1 g in dextrose 5 % 50 mL IVPB     1 g 100 mL/hr over 30 Minutes Intravenous Every 12 hours 07/31/13 1552        SUBJECTIVE:    3/26: He feels thora helped him with dyspnea. He is open to having PLEURX given likel malignant and recurrent effusoon and gives him relief. He will go hom with home hospice. Chemo on "hold". If he imprives, he might go for chemo again  VITAL SIGNS: Filed Vitals:   08/03/13 1900 08/03/13 2229 08/04/13 0437 08/04/13 0852  BP: 140/60 126/58 125/56   Pulse:  102 96   Temp:  98.1 F (36.7 C) 98.2 F (36.8 C)   TempSrc:  Axillary Oral   Resp:  20 18   Height:      Weight:      SpO2:  92% 94% 90%        VENTILATOR SETTINGS:  INTAKE / OUTPUT: I/O last 3 completed shifts: In: 40 [P.O.:360; IV Piggyback:300] Out: 2150 [Urine:2150]     PHYSICAL EXAMINATION: General:  Frail male, wants real food not heart healthy diet. Neuro:  AOx3. Moves all 4s HEENT:  Neck supple,, PEERL + Cardiovascular:  Normal  Hearts sounds Lungs:  Left side  reduced air entry and diminished.Normal WOB with nasal cannula on Abdomen:  Soft, Normal bowel sounds.  Musculoskeletal:  No cyanosis, No clubbing. No edema Skin:  Intact  LABS: PULMONARY No results found for this basename: PHART, PCO2, PCO2ART, PO2, PO2ART, HCO3, TCO2, O2SAT,  in the last 168 hours  CBC  Recent Labs Lab 07/31/13 1120 08/01/13 0411 08/03/13 0407  HGB 11.2* 10.3* 10.4*  HCT 34.5* 32.5* 32.0*  WBC 7.3 6.7 8.8  PLT 269 266 252    COAGULATION No results found for this basename: INR,  in the last 168 hours  CARDIAC    Recent Labs Lab 07/31/13 1120  TROPONINI <0.30    Recent Labs Lab 07/31/13 1120 08/01/13 0411  PROBNP 378.1* 422.0*     CHEMISTRY  Recent Labs Lab 07/31/13 1120 08/01/13 0411 08/03/13 0407  NA 140 137 136*  K 4.2 4.4 4.8  CL 98 96 93*  CO2 29 31 34*  GLUCOSE 106* 106* 146*  BUN 25* 24* 24*  CREATININE 0.92 0.99 0.92  CALCIUM 10.1 9.8 9.7   Estimated Creatinine Clearance: 55.7 ml/min (by C-G formula based on Cr of 0.92).   LIVER  Recent Labs Lab 07/31/13 1120  AST 22  ALT  12  ALKPHOS 116  BILITOT 0.3  PROT 6.8  ALBUMIN 2.8*     INFECTIOUS  Recent Labs Lab 07/31/13 1120 08/01/13 0411 08/03/13 0407  LATICACIDVEN 1.3  --   --   PROCALCITON  --  0.21 0.13     ENDOCRINE CBG (last 3)  No results found for this basename: GLUCAP,  in the last 72 hours       IMAGING x48h  Dg Chest 1 View  08/02/2013   CLINICAL DATA:  Status post left thoracentesis  EXAM: CHEST - 1 VIEW  COMPARISON:  Graft from 08/01/2013  FINDINGS: Cardiac and mediastinal silhouettes remain largely obscured.  Again seen is near complete opacification of the left hemi thorax, compatible with large left pleural effusion. There slightly improved aeration within the upper and mid left lung. No pneumothorax identified status post thoracentesis.  Linear opacity at the right lung base most compatible with atelectasis.  No acute osseus  abnormality.  IMPRESSION: 1. Persistent large left pleural effusion, slightly decreased in size status post interval left thoracentesis. No pneumothorax identified. 2. Slight interval improvement in aeration of the left upper and mid lung.   Electronically Signed   By: Jeannine Boga M.D.   On: 08/02/2013 15:51   US Thoracentesis Asp Pleural Space W/img Guide  08/02/2013   CLINICAL DATA:  Dyspnea, cough, stage IV metastatic lung carcinoma, recurrent left loculated pleural effusion; request is made for diagnostic and therapeutic left thoracentesis.  EXAM: ULTRASOUND GUIDED DIAGNOSTIC AND THERAPEUTIC LEFT  THORACENTESIS  COMPARISON:  PRIOR THORACENTESIS ON 04/27/2013.  PROCEDURE: An ultrasound guided thoracentesis was thoroughly discussed with the patient and questions answered. The benefits, risks, alternatives and complications were also discussed. The patient understands and wishes to proceed with the procedure. Written consent was obtained.  Ultrasound was performed to localize and mark an adequate pocket of fluid in the left chest. The area was then prepped and draped in the normal sterile fashion. 1% Lidocaine was used for local anesthesia. Under ultrasound guidance a 19 gauge Yueh catheter was introduced. Thoracentesis was performed. The catheter was removed and a dressing applied.  Complications:  none  FINDINGS: A total of approximately 350 cc's of blood-tinged fluid was removed. The fluid sample wassent for laboratory analysis.  IMPRESSION: Successful ultrasound guided diagnostic and therapeutic left thoracentesis yielding 350 cc's of pleural fluid.  Read by: Rowe Robert ,P.A.-C.   Electronically Signed   By: Markus Daft M.D.   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   Thora (3rd since dec 2014) gave him relief even if space is small. He is open to having PleurX cath. Wants to see Dr Servando Snare   P:   -No specific intervention for LUL necrotic process other than empiric antibiotics per [facility]; would give 8 day course  - can try diuresis; defer to [facility] -Best is symptom mgmt via morphine and palliative/hospice care - REfer Dr Servando Snare oupatient thoracic for [place] (d/w his office; they will call patient with appt)     PCCM will sign off   Dr. Brand Males, M.D., [facility].C.P Pulmonary and Critical Care Medicine Staff Physician [place] Pager: [phone], If no answer or between  15:00h - 7:00h: call [phone]  08/04/2013 10:14 AM

## 2013-08-05 ENCOUNTER — Other Ambulatory Visit: Payer: Self-pay | Admitting: *Deleted

## 2013-08-05 DIAGNOSIS — J90 Pleural effusion, not elsewhere classified: Secondary | ICD-10-CM

## 2013-08-05 NOTE — Progress Notes (Signed)
Spoke with pt and wife about copd gold program. They declined. They do not feel as if they need it at this time.

## 2013-08-08 ENCOUNTER — Encounter (HOSPITAL_COMMUNITY): Payer: Self-pay | Admitting: *Deleted

## 2013-08-08 ENCOUNTER — Ambulatory Visit (INDEPENDENT_AMBULATORY_CARE_PROVIDER_SITE_OTHER): Payer: Medicare Other | Admitting: Surgery

## 2013-08-08 ENCOUNTER — Encounter: Payer: Self-pay | Admitting: Surgery

## 2013-08-08 ENCOUNTER — Ambulatory Visit
Admission: RE | Admit: 2013-08-08 | Discharge: 2013-08-08 | Disposition: A | Discharge status: Home / Self Care | Payer: Medicare Other | Source: Ambulatory Visit | Attending: Surgery | Admitting: Surgery

## 2013-08-08 ENCOUNTER — Other Ambulatory Visit: Payer: Self-pay | Admitting: *Deleted

## 2013-08-08 VITALS — BP 101/58 | HR 102 | Resp 18 | Ht 65.0 in | Wt 121.6 lb

## 2013-08-08 DIAGNOSIS — J90 Pleural effusion, not elsewhere classified: Secondary | ICD-10-CM

## 2013-08-08 DIAGNOSIS — C3410 Malignant neoplasm of upper lobe, unspecified bronchus or lung: Secondary | ICD-10-CM

## 2013-08-08 DIAGNOSIS — C7931 Secondary malignant neoplasm of brain: Secondary | ICD-10-CM

## 2013-08-08 DIAGNOSIS — C7949 Secondary malignant neoplasm of other parts of nervous system: Secondary | ICD-10-CM

## 2013-08-08 NOTE — Progress Notes (Signed)
Cardiothoracic Surgery Consultation   PCP is Glo Herring., MD Referring Provider is Brand Males, MD  Chief Complaint  Patient presents with  . Pleural Effusion    left..has had 3 thoracenteses...not malignant per 08/02/13 path...he is stage IV lung cancer...eval for pleurX catheter    HPI:  73 year old gentleman with stage IV lung cancer originating in LUL of lung with brain mets on initial presentation in April 2014. He underwent stereotactic RT to the 2 brain lesions and palliative RT to the left lung mass. He underwent 6 cycles of chemo with carboplatin and Alimta, followed by Tarceva, which was stopped after two months for disease progression. He then underwent treatment with gemcitabine starting in December 2014 and last month underwent stereotactic RT to 3 new brain mets. He was started on an immunotherapy trial with Nivolumab. He recently was admitted with worsening shortness of breath and CT scan showed almost complete consolidation of the left lung. He also had a moderate-sized basilar pleural effusion that was treated with thoracentesis yielding 350 cc of serosanguinous fluid. Cytology was negative for malignancy. He says that his breathing improved after this.   Past Medical History  Diagnosis Date  . Substance abuse     TOBACCO  . Lung mass   . 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 Brain metastases 08/18/12    mri-  . HOH (hard of hearing)     wears hearing aids  . COPD (chronic obstructive pulmonary disease)   . Systolic heart failure     EF of 30 to 35% per echo in June 2014  . Lung cancer   . Legionella pneumonia   . CHF (congestive heart failure)     Past Surgical History  Procedure Laterality Date  . Wrist surgery    . Lung biopsy Left 08/17/12    LUL lung mass-adenocarcinoma  . Ganglion cyst excision    . Nose surgery      Family History  Problem Relation Age of Onset  . COPD Father   . Cancer Sister   . Diabetes Brother      Social History History  Substance Use Topics  . Smoking status: Former Smoker -- 1.00 packs/day for 60 years    Types: Cigarettes    Quit date: 09/27/2012  . Smokeless tobacco: Never Used  . Alcohol Use: No    Current Outpatient Prescriptions  Medication Sig Dispense Refill  . acetaminophen (TYLENOL) 650 MG CR tablet Take 1,300 mg by mouth every 8 (eight) hours as needed for pain.      Marland Kitchen amoxicillin-clavulanate (AUGMENTIN) 500-125 MG per tablet Take 1 tablet (500 mg total) by mouth 3 (three) times daily.  21 tablet  0  . Ascorbic Acid (VITAMIN C) 1000 MG tablet Take 1,000 mg by mouth daily.      . budesonide (PULMICORT) 0.25 MG/2ML nebulizer solution Take 2 mLs (0.25 mg total) by nebulization every 12 (twelve) hours.  60 mL  12  . carvedilol (COREG) 3.125 MG tablet Take 1 tablet (3.125 mg total) by mouth 2 (two) times daily.  180 tablet  3  . digoxin (LANOXIN) 0.125 MG tablet Take 1 tablet (0.125 mg total) by mouth daily.  90 tablet  3  . enoxaparin (LOVENOX) 60 MG/0.6ML injection Inject 0.6 mLs (60 mg total) into the skin daily.  90 Syringe  1  . feeding supplement (ENSURE COMPLETE) LIQD Take 237 mLs by mouth 2 (two) times daily between meals.  30 Bottle  5  . folic acid (FOLVITE) 1 MG tablet Take 1 mg by mouth daily.      . furosemide (LASIX) 20 MG tablet Take 1 tablet (20 mg total) by mouth daily.  30 tablet  1  . guaiFENesin (MUCINEX) 600 MG 12 hr tablet Take 600 mg by mouth daily.      Marland Kitchen levalbuterol (XOPENEX) 0.63 MG/3ML nebulizer solution Take 3 mLs (0.63 mg total) by nebulization every 4 (four) hours as needed for wheezing.  3 mL  120  . Morphine Sulfate (MORPHINE CONCENTRATE) 10 mg / 0.5 ml concentrated solution Take 0.5 mLs (10 mg total) by mouth every 4 (four) hours as needed for severe pain.  20 mL  0  . oxyCODONE-acetaminophen (PERCOCET) 10-325 MG per tablet Take 1 tablet by mouth every 4 (four) hours as needed (for breakthrough pain).  30 tablet  0  . OXYGEN-HELIUM IN  Inhale 4 L into the lungs.      . polyethylene glycol (MIRALAX / GLYCOLAX) packet Take 17 g by mouth 2 (two) times daily.  14 each  0  . predniSONE (DELTASONE) 10 MG tablet Take 5 tablets by mouth daily X 1 day; then 4 tablets by mouth daily X 2 days; then 3 tablets by mouth daily X 2 days; then 2 tablets by mouth daily X 2 days and then start taking 1 tablet by mouth daily.  30 tablet  1  . PRESCRIPTION MEDICATION Chemo-Dr Julien Nordmann .Nivolumab infusion      . senna-docusate (SENOKOT-S) 8.6-50 MG per tablet Take 1 tablet by mouth daily.       No current facility-administered medications for this visit.    No Known Allergies  Review of Systems  Constitutional: Positive for appetite change, fatigue and unexpected weight change.  HENT: Positive for hearing loss.        Wears dentures  Eyes: Negative.   Respiratory: Positive for cough and shortness of breath.        Uses oxygen at home  Cardiovascular: Positive for leg swelling. Negative for chest pain.  Gastrointestinal: Positive for constipation.  Endocrine: Negative.   Genitourinary: Positive for frequency.  Musculoskeletal: Negative.   Skin: Negative.   Allergic/Immunologic: Negative.   Neurological: Positive for numbness.       Hands and feet  Hematological: Bruises/bleeds easily.  Psychiatric/Behavioral: The patient is nervous/anxious.     BP 101/58  Pulse 102  Resp 18  Ht 5\' 5"  (1.651 m)  Wt 121 lb 9.6 oz (55.157 kg)  BMI 20.24 kg/m2  SpO2 90% Physical Exam  Constitutional: He is oriented to person, place, and time.  Cachectic gentleman in no acute distress  HENT:  Head: Normocephalic and atraumatic.  Mouth/Throat: Oropharynx is clear and moist.  Eyes: EOM are normal. Pupils are equal, round, and reactive to light.  Neck: Neck supple. No tracheal deviation present.  Cardiovascular: Normal rate, regular rhythm and normal heart sounds.   No murmur heard. Pulmonary/Chest: Effort normal. No respiratory distress.   Decreased breath sounds over left lower lobe.  Musculoskeletal: He exhibits edema.  Neurological: He is alert and oriented to person, place, and time. He has normal strength. No cranial nerve deficit.  Skin: Skin is warm and dry.  Psychiatric: He has a normal mood and affect.     Diagnostic Tests:  CLINICAL DATA: Pleural effusion and shortness of breath. Smoker.  Lung cancer.  EXAM:  CHEST 2 VIEW  COMPARISON: DG CHEST 1 VIEW dated 08/02/2013; CT CHEST W/CM dated  06/15/2013  FINDINGS:  Reduced aeration in the left lung with large pleural effusion and  airspace opacity through the small portion of the left upper lobe  which is still aerated. Left heart border completely obscured.  Thoracic spine appearance raises the possibility of ankylosing  spondylitis.  Severe emphysema. Increased bandlike opacity at the right lung base  with potential underlying pulmonary nodules ; a separate right  basilar 1.2 cm pulmonary nodule is present.  Severe emphysema.  IMPRESSION:  1. Further reduction in aeration in the left lung, with prominent  airspace opacity in the small residual aerated portion of the left  upper lobe, and large left pleural effusion.  2. Increased bandlike density at the right lung base, possibly with  an associated lung nodule. Separate and lung nodule also observed at  the right lung base.  3. Emphysema.  4. Thoracic spine findings suggesting ankylosing spondylitis.  Electronically Signed  By: Sherryl Barters M.D.  On: 08/08/2013 14:57     CLINICAL DATA: Worsening shortness of breath. Currently undergoing  chemotherapy for lung carcinoma.  EXAM:  CT CHEST WITHOUT CONTRAST  TECHNIQUE:  Multidetector CT imaging of the chest was performed following the  standard protocol without IV contrast.  COMPARISON: 07/14/2013  FINDINGS:  Comparison with previous study is somewhat limited by lack of  intravenous contrast on current exam. Large malignant left pleural   effusion with thick rind of tumor throughout the left pleural space  shows no significant change in size. Compressive left lower lobe  atelectasis again demonstrated. Left anterior apical lung mass with  adjacent pneumonitis is also similar in size. Increased  consolidation is seen in the left upper lobe and lingula when  compared to previous study, with near complete opacification of the  left lung now demonstrated.  Severe emphysema again seen involving the right lung, with large  subpleural bleb again seen in the superior right lower lobe. 8 x 9  mm pulmonary nodule in the anterior right middle lobe on image 38 is  stable in size. No new or enlarging pulmonary nodules or masses are  seen in the right lung. No evidence of mediastinal or right hilar  lymphadenopathy.  Visualized portions of the adrenal glands remain normal in  appearance. Upper abdominal lymphadenopathy at the level of the  celiac axis shows mild increase, with largest lymph node measuring  12 mm on image 51 compared to 10 mm previously. No suspicious bone  lesions identified.  IMPRESSION:  Increased pulmonary consolidation seen in left upper lobe and  lingula.  No significant change in large malignant left pleural effusion,  pleural tumor, or mass seen anterior left lung apex.  Stable 9 mm right middle lobe nodule and severe emphysema.  Slight increase in upper abdominal lymphadenopathy adjacent celiac  axis.  Electronically Signed  By: Earle Gell M.D.  On: 07/31/2013 14:54    Impression:  He has stage IV lung cancer with near complete consolidation of the left lung and a moderate left pleural effusion. I think most of his dyspnea is due to his severe COPD and involvement of left lung with cancer but he says he was symptomatically better after thoracentesis. It may be beneficial to insert a left pleurX catheter for continued drainage of this effusion for symptom relief. I told him that I could not predict how  much better it will make him feel. I discussed the procedure with the patient and his wife, alternative of no treatment or thoracentesis, potential benefits and risks including but not limited  to bleeding, infection, injury to the left lung, and inability to drain the fluid by this method. He understands and agrees to proceed. I will insert the pleurX tomorrow morning.   Plan:  Left pleurX catheter tomorrow.

## 2013-08-09 ENCOUNTER — Encounter (HOSPITAL_COMMUNITY): Payer: Medicare Other | Admitting: Anesthesiology

## 2013-08-09 ENCOUNTER — Encounter (HOSPITAL_COMMUNITY)
Admission: RE | Disposition: A | Discharge status: Home / Self Care | Payer: Self-pay | Source: Ambulatory Visit | Attending: Surgery

## 2013-08-09 ENCOUNTER — Ambulatory Visit (HOSPITAL_COMMUNITY)
Admission: RE | Admit: 2013-08-09 | Discharge: 2013-08-09 | Disposition: A | Discharge status: Home / Self Care | Payer: Medicare Other | Source: Ambulatory Visit | Attending: Surgery | Admitting: Surgery

## 2013-08-09 ENCOUNTER — Encounter (HOSPITAL_COMMUNITY): Payer: Self-pay | Admitting: Pharmacy Technician

## 2013-08-09 ENCOUNTER — Ambulatory Visit: Payer: Self-pay | Admitting: Cardiothoracic Surgery

## 2013-08-09 ENCOUNTER — Other Ambulatory Visit: Payer: Self-pay

## 2013-08-09 ENCOUNTER — Ambulatory Visit (HOSPITAL_COMMUNITY): Payer: Medicare Other

## 2013-08-09 ENCOUNTER — Ambulatory Visit (HOSPITAL_COMMUNITY): Payer: Medicare Other | Admitting: Anesthesiology

## 2013-08-09 ENCOUNTER — Encounter (HOSPITAL_COMMUNITY): Payer: Self-pay | Admitting: Anesthesiology

## 2013-08-09 DIAGNOSIS — J90 Pleural effusion, not elsewhere classified: Secondary | ICD-10-CM

## 2013-08-09 DIAGNOSIS — C3410 Malignant neoplasm of upper lobe, unspecified bronchus or lung: Secondary | ICD-10-CM | POA: Insufficient documentation

## 2013-08-09 DIAGNOSIS — Z79899 Other long term (current) drug therapy: Secondary | ICD-10-CM | POA: Insufficient documentation

## 2013-08-09 DIAGNOSIS — C7931 Secondary malignant neoplasm of brain: Secondary | ICD-10-CM | POA: Insufficient documentation

## 2013-08-09 DIAGNOSIS — J438 Other emphysema: Secondary | ICD-10-CM | POA: Insufficient documentation

## 2013-08-09 DIAGNOSIS — I509 Heart failure, unspecified: Secondary | ICD-10-CM | POA: Insufficient documentation

## 2013-08-09 DIAGNOSIS — Z5309 Procedure and treatment not carried out because of other contraindication: Secondary | ICD-10-CM | POA: Insufficient documentation

## 2013-08-09 DIAGNOSIS — I5022 Chronic systolic (congestive) heart failure: Secondary | ICD-10-CM | POA: Insufficient documentation

## 2013-08-09 DIAGNOSIS — C7949 Secondary malignant neoplasm of other parts of nervous system: Secondary | ICD-10-CM

## 2013-08-09 DIAGNOSIS — Z87891 Personal history of nicotine dependence: Secondary | ICD-10-CM | POA: Insufficient documentation

## 2013-08-09 HISTORY — PX: CHEST TUBE INSERTION: SHX231

## 2013-08-09 LAB — PROTIME-INR
INR: 1.03 (ref 0.00–1.49)
PROTHROMBIN TIME: 13.3 s (ref 11.6–15.2)

## 2013-08-09 LAB — CBC
HEMATOCRIT: 33.7 % — AB (ref 39.0–52.0)
Hemoglobin: 11.1 g/dL — ABNORMAL LOW (ref 13.0–17.0)
MCH: 33.7 pg (ref 26.0–34.0)
MCHC: 32.9 g/dL (ref 30.0–36.0)
MCV: 102.4 fL — ABNORMAL HIGH (ref 78.0–100.0)
Platelets: 232 10*3/uL (ref 150–400)
RBC: 3.29 MIL/uL — AB (ref 4.22–5.81)
RDW: 15.4 % (ref 11.5–15.5)
WBC: 10.4 10*3/uL (ref 4.0–10.5)

## 2013-08-09 LAB — COMPREHENSIVE METABOLIC PANEL
ALBUMIN: 2.7 g/dL — AB (ref 3.5–5.2)
ALT: 37 U/L (ref 0–53)
AST: 29 U/L (ref 0–37)
Alkaline Phosphatase: 107 U/L (ref 39–117)
BUN: 32 mg/dL — ABNORMAL HIGH (ref 6–23)
CO2: 33 mEq/L — ABNORMAL HIGH (ref 19–32)
CREATININE: 0.93 mg/dL (ref 0.50–1.35)
Calcium: 9.9 mg/dL (ref 8.4–10.5)
Chloride: 97 mEq/L (ref 96–112)
GFR calc non Af Amer: 81 mL/min — ABNORMAL LOW (ref >90)
GLUCOSE: 109 mg/dL — AB (ref 70–99)
POTASSIUM: 4.7 meq/L (ref 3.7–5.3)
Sodium: 140 mEq/L (ref 137–147)
TOTAL PROTEIN: 6.4 g/dL (ref 6.0–8.3)
Total Bilirubin: 0.4 mg/dL (ref 0.3–1.2)

## 2013-08-09 LAB — APTT: APTT: 26 s (ref 24–37)

## 2013-08-09 SURGERY — INSERTION, PLEURAL DRAINAGE CATHETER
Anesthesia: Monitor Anesthesia Care | Site: Chest | Laterality: Left

## 2013-08-09 MED ORDER — LIDOCAINE HCL (PF) 1 % IJ SOLN
INTRAMUSCULAR | Status: AC
  Filled 2013-08-09: qty 30

## 2013-08-09 MED ORDER — DEXMEDETOMIDINE HCL IN NACL 200 MCG/50ML IV SOLN
INTRAVENOUS | Status: AC
  Filled 2013-08-09: qty 50

## 2013-08-09 MED ORDER — LACTATED RINGERS IV SOLN
INTRAVENOUS | Status: DC
  Administered 2013-08-09: 08:00:00 via INTRAVENOUS

## 2013-08-09 MED ORDER — PHENYLEPHRINE 40 MCG/ML (10ML) SYRINGE FOR IV PUSH (FOR BLOOD PRESSURE SUPPORT)
PREFILLED_SYRINGE | INTRAVENOUS | Status: AC
  Filled 2013-08-09: qty 10

## 2013-08-09 MED ORDER — DEXMEDETOMIDINE HCL IN NACL 200 MCG/50ML IV SOLN
INTRAVENOUS | Status: DC | PRN
  Administered 2013-08-09: 0.4 ug/kg/h via INTRAVENOUS

## 2013-08-09 MED ORDER — CARVEDILOL 3.125 MG PO TABS
3.1250 mg | ORAL_TABLET | Freq: Once | ORAL | Status: AC
  Administered 2013-08-09: 3.125 mg via ORAL
  Filled 2013-08-09 (×2): qty 1

## 2013-08-09 MED ORDER — SUCCINYLCHOLINE CHLORIDE 20 MG/ML IJ SOLN
INTRAMUSCULAR | Status: AC
  Filled 2013-08-09: qty 1

## 2013-08-09 MED ORDER — FENTANYL CITRATE 0.05 MG/ML IJ SOLN
INTRAMUSCULAR | Status: AC
  Filled 2013-08-09: qty 5

## 2013-08-09 MED ORDER — FENTANYL CITRATE 0.05 MG/ML IJ SOLN
INTRAMUSCULAR | Status: DC | PRN
  Administered 2013-08-09 (×2): 25 ug via INTRAVENOUS

## 2013-08-09 MED ORDER — DEXTROSE 5 % IV SOLN
1.5000 g | INTRAVENOUS | Status: AC
  Administered 2013-08-09: 1.5 g via INTRAVENOUS
  Filled 2013-08-09 (×2): qty 1.5

## 2013-08-09 MED ORDER — OXYCODONE HCL 5 MG/5ML PO SOLN: 5.0000 mg | Freq: Once | ORAL | Status: DC | PRN

## 2013-08-09 MED ORDER — LACTATED RINGERS IV SOLN
INTRAVENOUS | Status: DC | PRN
  Administered 2013-08-09: 09:00:00 via INTRAVENOUS

## 2013-08-09 MED ORDER — EPHEDRINE SULFATE 50 MG/ML IJ SOLN
INTRAMUSCULAR | Status: AC
  Filled 2013-08-09: qty 1

## 2013-08-09 MED ORDER — OXYCODONE HCL 5 MG PO TABS: 5.0000 mg | ORAL_TABLET | Freq: Once | ORAL | Status: DC | PRN

## 2013-08-09 MED ORDER — LIDOCAINE HCL 1 % IJ SOLN
INTRAMUSCULAR | Status: DC | PRN
  Administered 2013-08-09: 20 mL

## 2013-08-09 SURGICAL SUPPLY — 31 items
BRUSH SCRUB EZ PLAIN DRY (MISCELLANEOUS) ×3 IMPLANT
CANISTER SUCTION 2500CC (MISCELLANEOUS) ×3 IMPLANT
COVER SURGICAL LIGHT HANDLE (MISCELLANEOUS) ×3 IMPLANT
DERMABOND ADVANCED (GAUZE/BANDAGES/DRESSINGS) ×2
DERMABOND ADVANCED .7 DNX12 (GAUZE/BANDAGES/DRESSINGS) ×1 IMPLANT
DRAPE C-ARM 42X72 X-RAY (DRAPES) ×3 IMPLANT
DRAPE LAPAROSCOPIC ABDOMINAL (DRAPES) ×3 IMPLANT
GLOVE BIOGEL PI IND STRL 6.5 (GLOVE) ×2 IMPLANT
GLOVE BIOGEL PI INDICATOR 6.5 (GLOVE) ×4
GLOVE EUDERMIC 7 POWDERFREE (GLOVE) ×3 IMPLANT
GOWN STRL REUS W/ TWL LRG LVL3 (GOWN DISPOSABLE) ×1 IMPLANT
GOWN STRL REUS W/ TWL XL LVL3 (GOWN DISPOSABLE) ×1 IMPLANT
GOWN STRL REUS W/TWL LRG LVL3 (GOWN DISPOSABLE) ×2
GOWN STRL REUS W/TWL XL LVL3 (GOWN DISPOSABLE) ×2
KIT BASIN OR (CUSTOM PROCEDURE TRAY) ×3 IMPLANT
KIT PLEURX DRAIN CATH 1000ML (MISCELLANEOUS) ×3 IMPLANT
KIT PLEURX DRAIN CATH 15.5FR (DRAIN) ×3 IMPLANT
KIT ROOM TURNOVER OR (KITS) ×3 IMPLANT
NEEDLE 18GX1X1/2 (RX/OR ONLY) (NEEDLE) ×3 IMPLANT
NEEDLE HYPO 25GX1X1/2 BEV (NEEDLE) ×3 IMPLANT
NS IRRIG 1000ML POUR BTL (IV SOLUTION) ×3 IMPLANT
PACK GENERAL/GYN (CUSTOM PROCEDURE TRAY) ×3 IMPLANT
PAD ARMBOARD 7.5X6 YLW CONV (MISCELLANEOUS) ×6 IMPLANT
SET DRAINAGE LINE (MISCELLANEOUS) IMPLANT
SUT ETHILON 3 0 PS 1 (SUTURE) ×3 IMPLANT
SUT VIC AB 3-0 X1 27 (SUTURE) ×3 IMPLANT
SYR CONTROL 10ML LL (SYRINGE) ×3 IMPLANT
TOWEL OR 17X24 6PK STRL BLUE (TOWEL DISPOSABLE) ×3 IMPLANT
TOWEL OR 17X26 10 PK STRL BLUE (TOWEL DISPOSABLE) ×3 IMPLANT
TRAP SPECIMEN MUCOUS 40CC (MISCELLANEOUS) ×3 IMPLANT
WATER STERILE IRR 1000ML POUR (IV SOLUTION) ×3 IMPLANT

## 2013-08-09 NOTE — Preoperative (Signed)
Beta Blockers   Reason not to administer Beta Blockers:Not Applicable 

## 2013-08-09 NOTE — Discharge Instructions (Addendum)
What to eat:  For your first meals, you should eat lightly; only small meals initially.  If you do not have nausea, you may eat larger meals.  Avoid spicy, greasy and heavy food.    General Anesthesia, Adult, Care After  Refer to this sheet in the next few weeks. These instructions provide you with information on caring for yourself after your procedure. Your health care provider may also give you more specific instructions. Your treatment has been planned according to current medical practices, but problems sometimes occur. Call your health care provider if you have any problems or questions after your procedure.  WHAT TO EXPECT AFTER THE PROCEDURE  After the procedure, it is typical to experience:  Sleepiness.  Nausea and vomiting. HOME CARE INSTRUCTIONS  For the first 24 hours after general anesthesia:  Have a responsible person with you.  Do not drive a car. If you are alone, do not take public transportation.  Do not drink alcohol.  Do not take medicine that has not been prescribed by your health care provider.  Do not sign important papers or make important decisions.  You may resume a normal diet and activities as directed by your health care provider.  Change bandages (dressings) as directed.  If you have questions or problems that seem related to general anesthesia, call the hospital and ask for the anesthetist or anesthesiologist on call. SEEK MEDICAL CARE IF:  You have nausea and vomiting that continue the day after anesthesia.  You develop a rash. SEEK IMMEDIATE MEDICAL CARE IF:  Resume previous activity without restriction  My office will call you today to schedule ultrasound guided thoracentesis by radiology at [facility].

## 2013-08-09 NOTE — Anesthesia Postprocedure Evaluation (Signed)
  Anesthesia Post-op Note  Patient: Luis Payne  Procedure(s) Performed: Procedure(s) with comments: INSERTION PLEURAL DRAINAGE CATHETER (Left) - Unsucessful Attempt  Patient Location: PACU  Anesthesia Type:MAC  Level of Consciousness: awake and alert   Airway and Oxygen Therapy: Patient Spontanous Breathing  Post-op Pain: none  Post-op Assessment: Post-op Vital signs reviewed, Patient's Cardiovascular Status Stable, Respiratory Function Stable, Patent Airway, No signs of Nausea or vomiting and Pain level controlled  Post-op Vital Signs: Reviewed and stable  Complications: No apparent anesthesia complications

## 2013-08-09 NOTE — Anesthesia Preprocedure Evaluation (Signed)
Anesthesia Evaluation  Patient identified by MRN, date of birth, ID band Patient awake    Reviewed: Allergy & Precautions, H&P , NPO status , Patient's Chart, lab work & pertinent test results  Airway Mallampati: I TM Distance: >3 FB Neck ROM: Full    Dental   Pulmonary shortness of breath, COPDformer smoker,  Lung ca + rhonchi   + decreased breath sounds+ wheezing  rales    Cardiovascular +CHF Rate:Tachycardia     Neuro/Psych Brain mets from lung    GI/Hepatic   Endo/Other    Renal/GU Renal disease     Musculoskeletal   Abdominal   Peds  Hematology  (+) anemia ,   Anesthesia Other Findings septic  Reproductive/Obstetrics                           Anesthesia Physical Anesthesia Plan  ASA: IV  Anesthesia Plan: MAC   Post-op Pain Management:    Induction: Intravenous  Airway Management Planned: Simple Face Mask  Additional Equipment:   Intra-op Plan:   Post-operative Plan:   Informed Consent: I have reviewed the patients History and Physical, chart, labs and discussed the procedure including the risks, benefits and alternatives for the proposed anesthesia with the patient or authorized representative who has indicated his/her understanding and acceptance.     Plan Discussed with: CRNA and Surgeon  Anesthesia Plan Comments:         Anesthesia Quick Evaluation

## 2013-08-09 NOTE — Interval H&P Note (Signed)
History and Physical Interval Note:  08/09/2013 9:30 AM  Luis Payne  has presented today for surgery, with the diagnosis of LEFT PLEURAL EFFUSION  The various methods of treatment have been discussed with the patient and family. After consideration of risks, benefits and other options for treatment, the patient has consented to  Procedure(s): INSERTION PLEURAL DRAINAGE CATHETER (Left) as a surgical intervention .  The patient's history has been reviewed, patient examined, no change in status, stable for surgery.  I have reviewed the patient's chart and labs.  Questions were answered to the patient's satisfaction.     Gaye Pollack

## 2013-08-09 NOTE — Transfer of Care (Signed)
Immediate Anesthesia Transfer of Care Note  Patient: Luis Payne  Procedure(s) Performed: Procedure(s) with comments: INSERTION PLEURAL DRAINAGE CATHETER (Left) - Unsucessful Attempt  Patient Location: PACU  Anesthesia Type:MAC  Level of Consciousness: awake, alert  and oriented  Airway & Oxygen Therapy: Patient Spontanous Breathing and Patient connected to face mask oxygen  Post-op Assessment: Report given to PACU RN  Post vital signs: Reviewed and stable  Complications: No apparent anesthesia complications

## 2013-08-09 NOTE — H&P (Signed)
Cardiothoracic Surgery History and Physical   PCP is Glo Herring., MD  Referring Provider is Brand Males, MD  Chief Complaint   Patient presents with   .  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 He underwent stereotactic RT to the 2 brain lesions and palliative RT to the left lung mass. He underwent 6 cycles of chemo with carboplatin and Alimta, followed by Tarceva, which was stopped after two months for disease progression. He then underwent treatment with gemcitabine starting in December 2014 and last month underwent stereotactic RT to 3 new brain mets. He was started on an immunotherapy trial with Nivolumab. He recently was admitted with worsening shortness of breath and CT scan showed almost complete consolidation of the left lung. He also had a moderate-sized basilar pleural effusion that was treated with thoracentesis yielding 350 cc of serosanguinous fluid. Cytology was negative for malignancy. He says that his breathing improved after this.   Past Medical History   Diagnosis  Date   .  Substance abuse      TOBACCO   .  Lung mass    .  Lung cancer  08/17/12     LUL bx=adenocarcinoma   .  Brain metastases  08/18/12     mri-   .  HOH (hard of hearing)      wears hearing aids   .  COPD (chronic obstructive pulmonary disease)    .  Systolic heart failure      EF of 30 to 35% per echo in June 2014   .  Lung cancer    .  Legionella pneumonia    .  CHF (congestive heart failure)     Past Surgical History   Procedure  Laterality  Date   .  Wrist surgery     .  Lung biopsy  Left  08/17/12     LUL lung mass-adenocarcinoma   .  Ganglion cyst excision     .  Nose surgery      Family History   Problem  Relation  Age of Onset   .  COPD   Father    .  Cancer  Sister    .  Diabetes  Brother    Social History  History   Substance Use Topics   .  Smoking status:  Former Smoker -- 1.00 packs/day for 60 years     Types:  Cigarettes     Quit date:  09/27/2012   .  Smokeless tobacco:  Never Used   .  Alcohol Use:  No    Current Outpatient Prescriptions   Medication  Sig  Dispense  Refill   .  acetaminophen (TYLENOL) 650 MG CR tablet  Take 1,300 mg by mouth every 8 (eight) hours as needed for pain.     Marland Kitchen  amoxicillin-clavulanate (AUGMENTIN) 500-125 MG per tablet  Take 1 tablet (500 mg total) by mouth 3 (three) times daily.  21 tablet  0   .  Ascorbic Acid (VITAMIN C) 1000 MG tablet  Take 1,000 mg by mouth daily.     .  budesonide (PULMICORT) 0.25 MG/2ML nebulizer solution  Take 2 mLs (0.25 mg total) by nebulization every 12 (twelve) hours.  60 mL  12   .  carvedilol (COREG) 3.125 MG tablet  Take 1 tablet (3.125 mg total) by mouth 2 (two) times daily.  180 tablet  3   .  digoxin (LANOXIN) 0.125 MG tablet  Take 1 tablet (0.125 mg total) by mouth daily.  90 tablet  3   .  enoxaparin (LOVENOX) 60 MG/0.6ML injection  Inject 0.6 mLs (60 mg total) into the skin daily.  90 Syringe  1   .  feeding supplement (ENSURE COMPLETE) LIQD  Take 237 mLs by mouth 2 (two) times daily between meals.  30 Bottle  5   .  folic acid (FOLVITE) 1 MG tablet  Take 1 mg by mouth daily.     .  furosemide (LASIX) 20 MG tablet  Take 1 tablet (20 mg total) by mouth daily.  30 tablet  1   .  guaiFENesin (MUCINEX) 600 MG 12 hr tablet  Take 600 mg by mouth daily.     Marland Kitchen  levalbuterol (XOPENEX) 0.63 MG/3ML nebulizer solution  Take 3 mLs (0.63 mg total) by nebulization every 4 (four) hours as needed for wheezing.  3 mL  120   .  Morphine Sulfate (MORPHINE CONCENTRATE) 10 mg / 0.5 ml concentrated solution  Take 0.5 mLs (10 mg total) by mouth every 4 (four) hours as needed for severe pain.  20 mL  0   .  oxyCODONE-acetaminophen (PERCOCET) 10-325 MG per tablet  Take 1  tablet by mouth every 4 (four) hours as needed (for breakthrough pain).  30 tablet  0   .  OXYGEN-HELIUM IN  Inhale 4 L into the lungs.     .  polyethylene glycol (MIRALAX / GLYCOLAX) packet  Take 17 g by mouth 2 (two) times daily.  14 each  0   .  predniSONE (DELTASONE) 10 MG tablet  Take 5 tablets by mouth daily X 1 day; then 4 tablets by mouth daily X 2 days; then 3 tablets by mouth daily X 2 days; then 2 tablets by mouth daily X 2 days and then start taking 1 tablet by mouth daily.  30 tablet  1   .  PRESCRIPTION MEDICATION  Chemo-Dr Julien Nordmann .Nivolumab infusion     .  senna-docusate (SENOKOT-S) 8.6-50 MG per tablet  Take 1 tablet by mouth daily.      No current facility-administered medications for this visit.   No Known Allergies  Review of Systems  Constitutional: Positive for appetite change, fatigue and unexpected weight change.  HENT: Positive for hearing loss.  Wears dentures  Eyes: Negative.  Respiratory: Positive for cough and shortness of breath.  Uses oxygen at home  Cardiovascular: Positive for leg swelling. Negative for chest pain.  Gastrointestinal: Positive for constipation.  Endocrine: Negative.  Genitourinary: Positive for frequency.  Musculoskeletal: Negative.  Skin: Negative.  Allergic/Immunologic: Negative.  Neurological: Positive for numbness.  Hands and feet  Hematological: Bruises/bleeds easily.  Psychiatric/Behavioral: The patient is nervous/anxious.  BP 101/58  Pulse 102  Resp 18  Ht 5\' 5"  (1.651 m)  Wt 121 lb 9.6 oz (55.157 kg)  BMI 20.24 kg/m2  SpO2 90%  Physical Exam  Constitutional: He is oriented to person, place, and time.  Cachectic gentleman in no acute distress  HENT:  Head: Normocephalic and atraumatic.  Mouth/Throat: Oropharynx is clear and moist.  Eyes: EOM are normal. Pupils are equal, round, and reactive to light.  Neck: Neck supple. No tracheal deviation present.  Cardiovascular: Normal rate, regular rhythm and normal heart sounds.   No murmur  heard.  Pulmonary/Chest: Effort normal. No respiratory distress.  Decreased breath sounds over left lower lobe.  Musculoskeletal: He exhibits edema.  Neurological: He is alert and oriented to person, place, and time. He has normal strength. No cranial nerve deficit.  Skin: Skin is warm and dry.  Psychiatric: He has a normal mood and affect.  Diagnostic Tests:  CLINICAL DATA: Pleural effusion and shortness of breath. Smoker.  Lung cancer.  EXAM:  CHEST 2 VIEW  COMPARISON: DG CHEST 1 VIEW dated 08/02/2013; CT CHEST W/CM dated  06/15/2013  FINDINGS:  Reduced aeration in the left lung with large pleural effusion and  airspace opacity through the small portion of the left upper lobe  which is still aerated. Left heart border completely obscured.  Thoracic spine appearance raises the possibility of ankylosing  spondylitis.  Severe emphysema. Increased bandlike opacity at the right lung base  with potential underlying pulmonary nodules ; a separate right  basilar 1.2 cm pulmonary nodule is present.  Severe emphysema.  IMPRESSION:  1. Further reduction in aeration in the left lung, with prominent  airspace opacity in the small residual aerated portion of the left  upper lobe, and large left pleural effusion.  2. Increased bandlike density at the right lung base, possibly with  an associated lung nodule. Separate and lung nodule also observed at  the right lung base.  3. Emphysema.  4. Thoracic spine findings suggesting ankylosing spondylitis.  Electronically Signed  By: Sherryl Barters M.D.  On: 08/08/2013 14:57  CLINICAL DATA: Worsening shortness of breath. Currently undergoing  chemotherapy for lung carcinoma.  EXAM:  CT CHEST WITHOUT CONTRAST  TECHNIQUE:  Multidetector CT imaging of the chest was performed following the  standard protocol without IV contrast.  COMPARISON: 07/14/2013  FINDINGS:  Comparison with previous study is somewhat limited by lack of   intravenous contrast on current exam. Large malignant left pleural  effusion with thick rind of tumor throughout the left pleural space  shows no significant change in size. Compressive left lower lobe  atelectasis again demonstrated. Left anterior apical lung mass with  adjacent pneumonitis is also similar in size. Increased  consolidation is seen in the left upper lobe and lingula when  compared to previous study, with near complete opacification of the  left lung now demonstrated.  Severe emphysema again seen involving the right lung, with large  subpleural bleb again seen in the superior right lower lobe. 8 x 9  mm pulmonary nodule in the anterior right middle lobe on image 38 is  stable in size. No new or enlarging pulmonary nodules or masses are  seen in the right lung. No evidence of mediastinal or right hilar  lymphadenopathy.  Visualized portions of the adrenal glands remain normal in  appearance. Upper abdominal lymphadenopathy at the level of the  celiac axis shows mild increase, with largest lymph node measuring  12 mm on image 51 compared to 10 mm previously. No suspicious bone  lesions identified.  IMPRESSION:  Increased pulmonary consolidation seen in left upper lobe and  lingula.  No significant change in large malignant left pleural effusion,  pleural tumor, or mass seen anterior left lung apex.  Stable 9 mm right middle lobe nodule and severe emphysema.  Slight increase in upper abdominal lymphadenopathy adjacent celiac  axis.  Electronically Signed  By: Earle Gell M.D.  On: 07/31/2013 14:54   Impression:   He has stage IV lung cancer with near complete consolidation of the left lung and a moderate  left pleural effusion. I think most of his dyspnea is due to his severe COPD and involvement of left lung with cancer but he says he was symptomatically better after thoracentesis. It may be beneficial to insert a left pleurX catheter for continued drainage of this  effusion for symptom relief. I told him that I could not predict how much better it will make him feel. I discussed the procedure with the patient and his wife, alternative of no treatment or thoracentesis, potential benefits and risks including but not limited to bleeding, infection, injury to the left lung, and inability to drain the fluid by this method. He understands and agrees to proceed. I will insert the pleurX tomorrow morning.   Plan:   Left pleurX catheter.

## 2013-08-09 NOTE — Op Note (Signed)
Cardiothoracic Surgery Operative Note     Luis Payne [id number] 08/09/2013  Preoperative Dx: Stage IV lung cancer with recurrent left pleural effusion  Procedure: Unsuccessful attempt at insertion of left PleurX catheter  Postoperative Dx: Same   Surgeon: Dr. 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 He underwent stereotactic RT to the 2 brain lesions and palliative RT to the left lung mass. He underwent 6 cycles of chemo with carboplatin and Alimta, followed by Tarceva, which was stopped after two months for disease progression. He then underwent treatment with gemcitabine starting in December 2014 and last month underwent stereotactic RT to 3 new brain mets. He was started on an immunotherapy trial with Nivolumab. He recently was admitted with worsening shortness of breath and CT scan showed almost complete consolidation of the left lung. He also had a moderate-sized basilar pleural effusion that was treated with thoracentesis yielding 350 cc of serosanguinous fluid. Cytology was negative for malignancy. He says that his breathing improved after this. He has stage IV lung cancer with near complete consolidation of the left lung and a moderate left pleural effusion. I think most of his dyspnea is due to his severe COPD and involvement of left lung with cancer but he says he was symptomatically better after thoracentesis. It may be beneficial to insert a left pleurX catheter for continued drainage of this effusion for symptom relief. I told him that I could not predict how much better it will make him feel. I discussed the procedure with the patient and his wife, alternative of no treatment or thoracentesis, potential benefits and risks including but not limited to bleeding, infection, injury to the left lung, and inability to  drain the fluid by this method. He understands and agrees to proceed.   Operative procedure:  The patient was seen in the preoperative holding area and the proper patient, proper procedure, and correct operative side were confirmed after reviewing his CT scan and CXR. The consent was signed and the left side of the chest signed by me. Preop antibiotic was given. He was taken to the OR and positioned supine with a roll under the left side. IV sedation was administered by anesthesia. The left chest was prepped and draped in a sterile manner. A time out was taken and  the proper patient, proper procedure, and correct operative side were confirmed with nursing and anesthesia staff. 1% lidocaine local anesthesia was used to anesthetize the skin, subcutaneous tissue and intercostal muscle along the left mid-axillary line in the lower intercostal spaces. I tried to locate the pleural effusion using a long thin needle but could not locate any fluid at multiple intercostal levels. I suspect this fluid is loculated at the base as seen on CT scan and not accessible from the side. Since no pleural fluid could be accessed the catheter could not be inserted and the procedure was ended. He tolerated it well and was transported to the PACU in satisfactory condition.

## 2013-08-09 NOTE — Brief Op Note (Signed)
08/09/2013  10:52 AM  PATIENT:  Luis Payne  73 y.o. male  PRE-OPERATIVE DIAGNOSIS:  LEFT PLEURAL EFFUSION  POST-OPERATIVE DIAGNOSIS:  LEFT PLEURAL EFFUSION  PROCEDURE:  Procedure(s) with comments: INSERTION PLEURAL DRAINAGE CATHETER (Left) - Unsucessful Attempt  SURGEON:  Surgeon(s) and Role:    * Gaye Pollack, MD - Primary  PHYSICIAN ASSISTANT: none  ASSISTANTS: none   ANESTHESIA:   local and IV sedation  EBL:  Total I/O In: 250 [I.V.:250] Out: -    DICTATION: .Note written in EPIC  PLAN OF CARE: Discharge to home after PACU  PATIENT DISPOSITION:  PACU - hemodynamically stable.   Delay start of Pharmacological VTE agent (>24hrs) due to surgical blood loss or risk of bleeding: no

## 2013-08-10 ENCOUNTER — Encounter (HOSPITAL_COMMUNITY): Payer: Self-pay | Admitting: Surgery

## 2013-08-10 LAB — OTHER BODY FLUID CHEMISTRY

## 2013-08-11 ENCOUNTER — Telehealth: Payer: Self-pay | Admitting: *Deleted

## 2013-08-11 NOTE — Telephone Encounter (Signed)
Pt's wife called wanting to know if pt needed to have lab work on 4/7 when he sees Dr Vista Mink.  Advised that pt keep all appts on 08/16/13 to discuss pt's status and plan for future treatments.  She verbalized understanding.  SLJ

## 2013-08-12 ENCOUNTER — Ambulatory Visit (HOSPITAL_COMMUNITY)
Admission: RE | Admit: 2013-08-12 | Discharge: 2013-08-12 | Disposition: A | Discharge status: Home / Self Care | Payer: Medicare Other | Source: Ambulatory Visit | Attending: Surgery | Admitting: Surgery

## 2013-08-12 ENCOUNTER — Other Ambulatory Visit: Payer: Self-pay | Admitting: Surgery

## 2013-08-12 DIAGNOSIS — Z5309 Procedure and treatment not carried out because of other contraindication: Secondary | ICD-10-CM | POA: Insufficient documentation

## 2013-08-12 DIAGNOSIS — J90 Pleural effusion, not elsewhere classified: Secondary | ICD-10-CM

## 2013-08-12 DIAGNOSIS — C3490 Malignant neoplasm of unspecified part of unspecified bronchus or lung: Secondary | ICD-10-CM | POA: Insufficient documentation

## 2013-08-12 DIAGNOSIS — R05 Cough: Secondary | ICD-10-CM | POA: Insufficient documentation

## 2013-08-12 DIAGNOSIS — R0989 Other specified symptoms and signs involving the circulatory and respiratory systems: Principal | ICD-10-CM | POA: Insufficient documentation

## 2013-08-12 DIAGNOSIS — R059 Cough, unspecified: Secondary | ICD-10-CM | POA: Insufficient documentation

## 2013-08-12 DIAGNOSIS — R0609 Other forms of dyspnea: Secondary | ICD-10-CM | POA: Insufficient documentation

## 2013-08-12 NOTE — Progress Notes (Signed)
Patient ID: Luis Payne, male   DOB: 17-Dec-1940, 73 y.o.   MRN: [id number] Pt presented to [place] dept today for therapeutic  left thoracentesis. On limited US left post chest there is no significant free pleural fluid present. There are extensive loculations noted throughout left lung field. Images were reviewed by Dr. Barbie Banner and thoracentesis cancelled. Pt informed.

## 2013-08-15 ENCOUNTER — Ambulatory Visit: Payer: Medicare Other | Admitting: Radiation Oncology

## 2013-08-16 ENCOUNTER — Encounter: Payer: Self-pay | Admitting: Internal Medicine

## 2013-08-16 ENCOUNTER — Telehealth: Payer: Self-pay | Admitting: Internal Medicine

## 2013-08-16 ENCOUNTER — Other Ambulatory Visit (HOSPITAL_BASED_OUTPATIENT_CLINIC_OR_DEPARTMENT_OTHER): Payer: Medicare Other

## 2013-08-16 ENCOUNTER — Ambulatory Visit (HOSPITAL_BASED_OUTPATIENT_CLINIC_OR_DEPARTMENT_OTHER): Payer: Medicare Other

## 2013-08-16 ENCOUNTER — Other Ambulatory Visit: Payer: Self-pay

## 2013-08-16 ENCOUNTER — Encounter: Payer: Medicare Other | Admitting: *Deleted

## 2013-08-16 ENCOUNTER — Ambulatory Visit (HOSPITAL_BASED_OUTPATIENT_CLINIC_OR_DEPARTMENT_OTHER): Payer: Medicare Other | Admitting: Internal Medicine

## 2013-08-16 VITALS — BP 105/58 | HR 97 | Temp 98.0°F | Resp 18 | Ht 65.0 in | Wt 125.7 lb

## 2013-08-16 DIAGNOSIS — R52 Pain, unspecified: Secondary | ICD-10-CM

## 2013-08-16 DIAGNOSIS — C3410 Malignant neoplasm of upper lobe, unspecified bronchus or lung: Secondary | ICD-10-CM

## 2013-08-16 DIAGNOSIS — C7949 Secondary malignant neoplasm of other parts of nervous system: Secondary | ICD-10-CM

## 2013-08-16 DIAGNOSIS — J449 Chronic obstructive pulmonary disease, unspecified: Secondary | ICD-10-CM

## 2013-08-16 DIAGNOSIS — C7931 Secondary malignant neoplasm of brain: Secondary | ICD-10-CM

## 2013-08-16 DIAGNOSIS — Z5112 Encounter for antineoplastic immunotherapy: Secondary | ICD-10-CM

## 2013-08-16 LAB — CBC WITH DIFFERENTIAL/PLATELET
BASO%: 0.3 % (ref 0.0–2.0)
BASOS ABS: 0 10*3/uL (ref 0.0–0.1)
EOS%: 0.3 % (ref 0.0–7.0)
Eosinophils Absolute: 0 10*3/uL (ref 0.0–0.5)
HCT: 30.8 % — ABNORMAL LOW (ref 38.4–49.9)
HEMOGLOBIN: 10.4 g/dL — AB (ref 13.0–17.1)
LYMPH#: 0.3 10*3/uL — AB (ref 0.9–3.3)
LYMPH%: 3.7 % — ABNORMAL LOW (ref 14.0–49.0)
MCH: 33.2 pg (ref 27.2–33.4)
MCHC: 33.6 g/dL (ref 32.0–36.0)
MCV: 98.7 fL — AB (ref 79.3–98.0)
MONO#: 0.7 10*3/uL (ref 0.1–0.9)
MONO%: 7.7 % (ref 0.0–14.0)
NEUT%: 88 % — ABNORMAL HIGH (ref 39.0–75.0)
NEUTROS ABS: 7.7 10*3/uL — AB (ref 1.5–6.5)
Platelets: 204 10*3/uL (ref 140–400)
RBC: 3.12 10*6/uL — ABNORMAL LOW (ref 4.20–5.82)
RDW: 15.3 % — AB (ref 11.0–14.6)
WBC: 8.7 10*3/uL (ref 4.0–10.3)

## 2013-08-16 LAB — COMPREHENSIVE METABOLIC PANEL (CC13)
ALBUMIN: 2.5 g/dL — AB (ref 3.5–5.0)
ALT: 17 U/L (ref 0–55)
AST: 19 U/L (ref 5–34)
Alkaline Phosphatase: 102 U/L (ref 40–150)
Anion Gap: 10 mEq/L (ref 3–11)
BUN: 25.3 mg/dL (ref 7.0–26.0)
CO2: 33 mEq/L — ABNORMAL HIGH (ref 22–29)
Calcium: 10 mg/dL (ref 8.4–10.4)
Chloride: 100 mEq/L (ref 98–109)
Creatinine: 0.9 mg/dL (ref 0.7–1.3)
Glucose: 153 mg/dl — ABNORMAL HIGH (ref 70–140)
POTASSIUM: 4.1 meq/L (ref 3.5–5.1)
Sodium: 143 mEq/L (ref 136–145)
Total Bilirubin: 0.32 mg/dL (ref 0.20–1.20)
Total Protein: 6.1 g/dL — ABNORMAL LOW (ref 6.4–8.3)

## 2013-08-16 LAB — MAGNESIUM (CC13): MAGNESIUM: 1.7 mg/dL (ref 1.5–2.5)

## 2013-08-16 LAB — LACTATE DEHYDROGENASE (CC13): LDH: 125 U/L (ref 125–245)

## 2013-08-16 LAB — PHOSPHORUS: Phosphorus: 3.3 mg/dL (ref 2.3–4.6)

## 2013-08-16 MED ORDER — PREDNISONE 10 MG PO TABS: 10.0000 mg | ORAL_TABLET | Freq: Every day | ORAL | Status: DC

## 2013-08-16 MED ORDER — SODIUM CHLORIDE 0.9 % IV SOLN
3.0000 mg/kg | Freq: Once | INTRAVENOUS | Status: AC
  Administered 2013-08-16: 173 mg via INTRAVENOUS
  Filled 2013-08-16: qty 17.3

## 2013-08-16 MED ORDER — OXYCODONE HCL 5 MG PO TABS: 5.0000 mg | ORAL_TABLET | Freq: Four times a day (QID) | ORAL | Status: DC | PRN

## 2013-08-16 MED ORDER — SODIUM CHLORIDE 0.9 % IV SOLN
Freq: Once | INTRAVENOUS | Status: AC
  Administered 2013-08-16: 12:00:00 via INTRAVENOUS

## 2013-08-16 NOTE — Telephone Encounter (Signed)
gv and printed aptp sched and avs for pt for April adn May....Marland Kitchensed added tx.

## 2013-08-16 NOTE — Progress Notes (Signed)
08/16/2013 Patient in to clinic today for evaluation prior to resuming treatment with nivolumab. Patient reports no significant change in his breathing since hospital discharge. He continues on prednisone 10mg  daily; per ePIP correspondence with [facility], treatment may be resumed once dose of prednisone is at 10mg  daily. Based on lab results review and history and physical by Dr. Julien Nordmann, patient condition is acceptable for re-treatment. Patient and wife are in agreement with this plan. Sign for infusion given to Lannette Donath RN. Cindy S. Brigitte Pulse BSN, RN, [place] 08/16/2013 11:53 AM

## 2013-08-16 NOTE — Patient Instructions (Signed)
[place] Discharge Instructions for Patients Receiving Chemotherapy  Today you received the following chemotherapy agents Nivolumab.  To help prevent nausea and vomiting after your treatment, we encourage you to take your nausea medication if needed.   If you develop nausea and vomiting that is not controlled by your nausea medication, call the clinic.   BELOW ARE SYMPTOMS THAT SHOULD BE REPORTED IMMEDIATELY:  *FEVER GREATER THAN 100.5 F  *CHILLS WITH OR WITHOUT FEVER  NAUSEA AND VOMITING THAT IS NOT CONTROLLED WITH YOUR NAUSEA MEDICATION  *UNUSUAL SHORTNESS OF BREATH  *UNUSUAL BRUISING OR BLEEDING  TENDERNESS IN MOUTH AND THROAT WITH OR WITHOUT PRESENCE OF ULCERS  *URINARY PROBLEMS  *BOWEL PROBLEMS  UNUSUAL RASH Items with * indicate a potential emergency and should be followed up as soon as possible.  Feel free to call the clinic you have any questions or concerns. The clinic phone number is [phone].

## 2013-08-16 NOTE — Progress Notes (Addendum)
[place] [phone]) [phone]   [phone]) [phone]  OFFICE VISIT PROGRESS NOTE  Glo Herring., MD [address] [place] [address]  DIAGNOSIS: Metastatic non-small cell lung cancer, adenocarcinoma with negative EGFR mutation and negative ALK gene translocation diagnosed in April 2014   PRIOR THERAPY:  1. Status post stereotactic radiotherapy to 2 brain lesions under the care of Dr. Tammi Klippel.  2. Status post palliative radiotherapy to the left lung mass.  3. Systemic chemotherapy with carboplatin for AUC of 5 and Alimta 500 mg/M2 every 3 weeks, status post 6 cycles. 4. Tarceva 150 mg by mouth daily. Started 02/25/2013. Status post approximately two month of therapy discontinued today secondary to disease progression. 5. Systemic chemotherapy with gemcitabine 1000 mg/M2 on days 1 and 8 every 3 weeks. First dose on 04/26/2013. 6. Stereotactic radiotherapy to 3 metastatic brain lesions completed on 07/04/2013.  CURRENT THERAPY: BMS immunotherapy clinical trial with Nivolumab. First cycle 07/19/2013   CHEMOTHERAPY INTENT: Palliative  CURRENT # OF CHEMOTHERAPY CYCLES: 1 CURRENT ANTIEMETICS: Compazine CURRENT SMOKING STATUS: Former smoker, quit 09/28/2012  ORAL CHEMOTHERAPY AND CONSENT: Tarceva and the patient signed consent.  CURRENT BISPHOSPHONATES USE: none  PAIN MANAGEMENT: Percocet  NARCOTICS INDUCED CONSTIPATION: none  LIVING WILL AND CODE STATUS: No code Blue.  INTERVAL HISTORY: Luis Payne 73 y.o. male returns to the clinic today for followup visit accompanied by his wife. The patient continues to have the baseline shortness of breath and he is currently on home oxygen. He also on prednisone 10 mg by mouth daily. He was recently admitted to [facility] with worsening shortness of breath and was treated for COPD exacerbation. He is feeling much better today but still complaining of mild fatigue. He tolerated the first  cycle of his immunotherapy with Nivolumab fairly well but missed the second cycle secondary to hospitalization and steroid treatment. He denied having any significant nausea or vomiting, no fever or chills. He is here today for evaluation before starting cycle #2 of Nivolumab.  MEDICAL HISTORY: Past Medical History  Diagnosis Date  . Substance abuse     TOBACCO  . Lung mass   . 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 Brain metastases 08/18/12    mri-  . HOH (hard of hearing)     wears hearing aids  . COPD (chronic obstructive pulmonary disease)   . Systolic heart failure     EF of 30 to 35% per echo in June 2014  . Lung cancer   . Legionella pneumonia   . CHF (congestive heart failure)     ALLERGIES:  has No Known Allergies.  MEDICATIONS:  Current Outpatient Prescriptions  Medication Sig Dispense Refill  . budesonide (PULMICORT) 0.25 MG/2ML nebulizer solution Take 0.25 mg by nebulization every 12 (twelve) hours.      . carvedilol (COREG) 3.125 MG tablet Take 3.125 mg by mouth 2 (two) times daily.      . digoxin (LANOXIN) 0.125 MG tablet Take 0.125 mg by mouth daily.      Marland Kitchen enoxaparin (LOVENOX) 60 MG/0.6ML injection Inject 1 mg/kg into the skin daily.      . feeding supplement, ENSURE COMPLETE, (ENSURE COMPLETE) LIQD Take 237 mLs by mouth 2 (two) times daily between meals.      . folic acid (FOLVITE) 1 MG tablet Take 1 mg by mouth daily.      . furosemide (LASIX) 20 MG tablet Take 20 mg by mouth  daily.      . levalbuterol (XOPENEX) 0.63 MG/3ML nebulizer solution Take 0.63 mg by nebulization every 4 (four) hours as needed for wheezing.      . Morphine Sulfate (MORPHINE CONCENTRATE) 10 mg / 0.5 ml concentrated solution Take 5 mg by mouth daily as needed for severe pain.      Marland Kitchen oxyCODONE-acetaminophen (PERCOCET) 10-325 MG per tablet Take 1 tablet by mouth every 4 (four) hours as needed for pain (for breakthrough pain).      . OXYGEN-HELIUM IN Inhale 4 L into the lungs.      .  predniSONE (DELTASONE) 10 MG tablet Take 5 tablets by mouth daily X 1 day; then 4 tablets by mouth daily X 2 days; then 3 tablets by mouth daily X 2 days; then 2 tablets by mouth daily X 2 days and then start taking 1 tablet by mouth daily.      Marland Kitchen PRESCRIPTION MEDICATION Chemo-Dr Julien Nordmann .Nivolumab infusion      . senna-docusate (SENOKOT-S) 8.6-50 MG per tablet Take 1 tablet by mouth daily.       No current facility-administered medications for this visit.    SURGICAL HISTORY:  Past Surgical History  Procedure Laterality Date  . Wrist surgery    . Lung biopsy Left 08/17/12    LUL lung mass-adenocarcinoma  . Ganglion cyst excision    . Nose surgery    . Chest tube insertion Left 08/09/2013    Procedure: INSERTION PLEURAL DRAINAGE CATHETER;  Surgeon: Gaye Pollack, MD;  Location: [facility] OR;  Service: Thoracic;  Laterality: Left;  Unsucessful Attempt    REVIEW OF SYSTEMS:  Constitutional: positive for fatigue Eyes: positive for irritation Ears, nose, mouth, throat, and face: negative Respiratory: positive for dyspnea on exertion and On supplemental oxygen, 2 L nasal cannula Cardiovascular: negative Gastrointestinal: negative Genitourinary:negative Integument/breast: negative Hematologic/lymphatic: negative Musculoskeletal:negative Neurological: negative Behavioral/Psych: negative Endocrine: negative Allergic/Immunologic: negative   PHYSICAL EXAMINATION: General appearance: alert, cooperative, fatigued and no distress Head: Normocephalic, without obvious abnormality, atraumatic Neck: no adenopathy, no JVD, supple, symmetrical, trachea midline and thyroid not enlarged, symmetric, no tenderness/mass/nodules Lymph nodes: Cervical, supraclavicular, and axillary nodes normal. Resp: wheezes bilaterally Back: symmetric, no curvature. ROM normal. No CVA tenderness. Cardio: regular rate and rhythm, S1, S2 normal, no murmur, click, rub or gallop GI: soft, non-tender; bowel sounds normal; no  masses,  no organomegaly Extremities: extremities normal, atraumatic, no cyanosis or edema Neurologic: Alert and oriented X 3, normal strength and tone. Normal symmetric reflexes. Normal coordination and gait    ECOG PERFORMANCE STATUS: 2 - Symptomatic, <50% confined to bed  Blood pressure 105/58, pulse 97, temperature 98 F (36.7 C), temperature source Oral, resp. rate 18, height 5' 5"  (1.651 m), weight 125 lb 11.2 oz (57.017 kg).  LABORATORY DATA: Lab Results  Component Value Date   WBC 8.7 08/16/2013   HGB 10.4* 08/16/2013   HCT 30.8* 08/16/2013   MCV 98.7* 08/16/2013   PLT 204 08/16/2013      Chemistry      Component Value Date/Time   NA 143 08/16/2013 0924   NA 140 08/09/2013 0741   K 4.1 08/16/2013 0924   K 4.7 08/09/2013 0741   CL 97 08/09/2013 0741   CL 94* 11/01/2012 1105   CO2 33* 08/16/2013 0924   CO2 33* 08/09/2013 0741   BUN 25.3 08/16/2013 0924   BUN 32* 08/09/2013 0741   CREATININE 0.9 08/16/2013 0924   CREATININE 0.93 08/09/2013 0741  Component Value Date/Time   CALCIUM 10.0 08/16/2013 0924   CALCIUM 9.9 08/09/2013 0741   ALKPHOS 102 08/16/2013 0924   ALKPHOS 107 08/09/2013 0741   AST 19 08/16/2013 0924   AST 29 08/09/2013 0741   ALT 17 08/16/2013 0924   ALT 37 08/09/2013 0741   BILITOT 0.32 08/16/2013 0924   BILITOT 0.4 08/09/2013 0741       RADIOGRAPHIC STUDIES:  ASSESSMENT AND PLAN: This is a very pleasant 73 years old white male with history of metastatic non-small cell lung cancer, adenocarcinoma status post systemic chemotherapy with carboplatin and Alimta followed by 2 months treatment with single agent Tarceva and one dose of gemcitabine discontinued secondary to her recent hospitalization with severe dyspnea.  The patient is currently undergoing evaluation for involvement in the BMS [id number] clinical trial with Nivolumab, The patient related the first cycle of his treatment fairly well but his second cycle was delayed by 2 weeks secondary to COPD exacerbation and steroid  treatment. He is feeling much better today and ready to resume his treatment with Nivolumab For COPD, the patient will continue on prednisone 10 mg by mouth daily in addition to his current inhaler and nebulizer treatment. For pain management I change in his medication to OxyIR 5 mg by mouth every 4 hours as needed. I would see the patient back for follow up visit in 2 weeks for reevaluation before starting his treatment with immunotherapy. He was advised to call immediately if he has any concerning symptoms in the interval. The patient voices understanding of current disease status and treatment options and is in agreement with the current care plan.  All questions were answered. The patient knows to call the clinic with any problems, questions or concerns. We can certainly see the patient much sooner if necessary.  Disclaimer: This note was dictated with voice recognition software. Similar sounding words can inadvertently be transcribed and may not be corrected upon review.  Eilleen Kempf., MD 08/16/2013

## 2013-08-17 ENCOUNTER — Other Ambulatory Visit: Payer: Self-pay | Admitting: Radiation Therapy

## 2013-08-17 DIAGNOSIS — C7931 Secondary malignant neoplasm of brain: Secondary | ICD-10-CM

## 2013-08-17 DIAGNOSIS — C7949 Secondary malignant neoplasm of other parts of nervous system: Principal | ICD-10-CM

## 2013-08-19 ENCOUNTER — Telehealth: Payer: Self-pay | Admitting: Radiation Oncology

## 2013-08-19 NOTE — Telephone Encounter (Signed)
Phoned in Ativan 1 mg, po, every eight hours prn anxiety and 30 minutes before MRI, disp #20, no refills. Spoke with Maudie Mercury at [facility] [employer], [place]. Phoned patient on his cell. Informed him script had been called in. He verbalized understanding and expressed appreciation for the call.

## 2013-08-22 ENCOUNTER — Ambulatory Visit: Payer: Medicare Other | Admitting: Radiation Oncology

## 2013-08-26 ENCOUNTER — Telehealth: Payer: Self-pay | Admitting: Internal Medicine

## 2013-08-26 ENCOUNTER — Other Ambulatory Visit: Payer: Self-pay | Admitting: *Deleted

## 2013-08-26 DIAGNOSIS — C3410 Malignant neoplasm of upper lobe, unspecified bronchus or lung: Secondary | ICD-10-CM

## 2013-08-26 NOTE — Telephone Encounter (Signed)
Test already scheduled no new orders.

## 2013-08-30 ENCOUNTER — Telehealth: Payer: Self-pay | Admitting: *Deleted

## 2013-08-30 ENCOUNTER — Telehealth: Payer: Self-pay | Admitting: Internal Medicine

## 2013-08-30 ENCOUNTER — Telehealth: Payer: Self-pay | Admitting: Radiation Oncology

## 2013-08-30 ENCOUNTER — Ambulatory Visit (HOSPITAL_BASED_OUTPATIENT_CLINIC_OR_DEPARTMENT_OTHER): Payer: Medicare Other

## 2013-08-30 ENCOUNTER — Encounter: Payer: Medicare Other | Admitting: *Deleted

## 2013-08-30 ENCOUNTER — Other Ambulatory Visit (HOSPITAL_BASED_OUTPATIENT_CLINIC_OR_DEPARTMENT_OTHER): Payer: Medicare Other

## 2013-08-30 ENCOUNTER — Ambulatory Visit (HOSPITAL_BASED_OUTPATIENT_CLINIC_OR_DEPARTMENT_OTHER): Payer: Medicare Other | Admitting: Internal Medicine

## 2013-08-30 ENCOUNTER — Encounter: Payer: Self-pay | Admitting: Internal Medicine

## 2013-08-30 ENCOUNTER — Encounter: Payer: Self-pay | Admitting: *Deleted

## 2013-08-30 VITALS — BP 112/63 | HR 96 | Temp 97.0°F | Resp 20 | Ht 65.0 in | Wt 125.3 lb

## 2013-08-30 DIAGNOSIS — J449 Chronic obstructive pulmonary disease, unspecified: Secondary | ICD-10-CM

## 2013-08-30 DIAGNOSIS — M545 Low back pain, unspecified: Secondary | ICD-10-CM

## 2013-08-30 DIAGNOSIS — C3410 Malignant neoplasm of upper lobe, unspecified bronchus or lung: Secondary | ICD-10-CM

## 2013-08-30 DIAGNOSIS — J4489 Other specified chronic obstructive pulmonary disease: Secondary | ICD-10-CM

## 2013-08-30 DIAGNOSIS — R5383 Other fatigue: Secondary | ICD-10-CM

## 2013-08-30 DIAGNOSIS — R0602 Shortness of breath: Secondary | ICD-10-CM

## 2013-08-30 DIAGNOSIS — C7931 Secondary malignant neoplasm of brain: Secondary | ICD-10-CM

## 2013-08-30 DIAGNOSIS — R5381 Other malaise: Secondary | ICD-10-CM

## 2013-08-30 DIAGNOSIS — C7949 Secondary malignant neoplasm of other parts of nervous system: Secondary | ICD-10-CM

## 2013-08-30 DIAGNOSIS — Z5111 Encounter for antineoplastic chemotherapy: Secondary | ICD-10-CM

## 2013-08-30 LAB — COMPREHENSIVE METABOLIC PANEL (CC13)
ALT: 13 U/L (ref 0–55)
AST: 21 U/L (ref 5–34)
Albumin: 2.5 g/dL — ABNORMAL LOW (ref 3.5–5.0)
Alkaline Phosphatase: 132 U/L (ref 40–150)
Anion Gap: 11 mEq/L (ref 3–11)
BUN: 25.6 mg/dL (ref 7.0–26.0)
CALCIUM: 10.3 mg/dL (ref 8.4–10.4)
CO2: 34 meq/L — AB (ref 22–29)
Chloride: 95 mEq/L — ABNORMAL LOW (ref 98–109)
Creatinine: 1 mg/dL (ref 0.7–1.3)
Glucose: 118 mg/dl (ref 70–140)
Potassium: 4.1 mEq/L (ref 3.5–5.1)
Sodium: 139 mEq/L (ref 136–145)
Total Bilirubin: 0.34 mg/dL (ref 0.20–1.20)
Total Protein: 6.2 g/dL — ABNORMAL LOW (ref 6.4–8.3)

## 2013-08-30 LAB — CBC WITH DIFFERENTIAL/PLATELET
BASO%: 0.2 % (ref 0.0–2.0)
BASOS ABS: 0 10*3/uL (ref 0.0–0.1)
EOS%: 0.3 % (ref 0.0–7.0)
Eosinophils Absolute: 0 10*3/uL (ref 0.0–0.5)
HEMATOCRIT: 30.8 % — AB (ref 38.4–49.9)
HGB: 10.5 g/dL — ABNORMAL LOW (ref 13.0–17.1)
LYMPH#: 0.5 10*3/uL — AB (ref 0.9–3.3)
LYMPH%: 5.1 % — ABNORMAL LOW (ref 14.0–49.0)
MCH: 33.6 pg — ABNORMAL HIGH (ref 27.2–33.4)
MCHC: 34.2 g/dL (ref 32.0–36.0)
MCV: 98.3 fL — ABNORMAL HIGH (ref 79.3–98.0)
MONO#: 0.8 10*3/uL (ref 0.1–0.9)
MONO%: 7.6 % (ref 0.0–14.0)
NEUT#: 8.7 10*3/uL — ABNORMAL HIGH (ref 1.5–6.5)
NEUT%: 86.8 % — AB (ref 39.0–75.0)
Platelets: 296 10*3/uL (ref 140–400)
RBC: 3.13 10*6/uL — ABNORMAL LOW (ref 4.20–5.82)
RDW: 15.2 % — ABNORMAL HIGH (ref 11.0–14.6)
WBC: 10 10*3/uL (ref 4.0–10.3)

## 2013-08-30 LAB — PHOSPHORUS: PHOSPHORUS: 3.8 mg/dL (ref 2.3–4.6)

## 2013-08-30 LAB — MAGNESIUM (CC13): Magnesium: 1.8 mg/dl (ref 1.5–2.5)

## 2013-08-30 LAB — RESEARCH LABS

## 2013-08-30 LAB — LACTATE DEHYDROGENASE (CC13): LDH: 150 U/L (ref 125–245)

## 2013-08-30 MED ORDER — MORPHINE SULFATE ER 30 MG PO TBCR: 30.0000 mg | EXTENDED_RELEASE_TABLET | Freq: Two times a day (BID) | ORAL | Status: DC

## 2013-08-30 MED ORDER — OXYCODONE HCL 5 MG PO TABS: 5.0000 mg | ORAL_TABLET | Freq: Four times a day (QID) | ORAL | Status: DC | PRN

## 2013-08-30 MED ORDER — SODIUM CHLORIDE 0.9 % IV SOLN
3.0000 mg/kg | Freq: Once | INTRAVENOUS | Status: AC
  Administered 2013-08-30: 173 mg via INTRAVENOUS
  Filled 2013-08-30: qty 17.3

## 2013-08-30 MED ORDER — SODIUM CHLORIDE 0.9 % IV SOLN
Freq: Once | INTRAVENOUS | Status: AC
  Administered 2013-08-30: 11:00:00 via INTRAVENOUS

## 2013-08-30 NOTE — Telephone Encounter (Signed)
Gave pty appt for lab,md and chemo for April and MAy 2015, gave pt oral contrast

## 2013-08-30 NOTE — Telephone Encounter (Signed)
Per staff message and POF I have scheduled appts.  JMW  

## 2013-08-30 NOTE — Patient Instructions (Signed)
[facility] Discharge Instructions for Patients Receiving Chemotherapy  Today you received the following chemotherapy agent Nivolumab  To help prevent nausea and vomiting after your treatment, we encourage you to take your nausea medication.   If you develop nausea and vomiting that is not controlled by your nausea medication, call the clinic.   BELOW ARE SYMPTOMS THAT SHOULD BE REPORTED IMMEDIATELY:  *FEVER GREATER THAN 100.5 F  *CHILLS WITH OR WITHOUT FEVER  NAUSEA AND VOMITING THAT IS NOT CONTROLLED WITH YOUR NAUSEA MEDICATION  *UNUSUAL SHORTNESS OF BREATH  *UNUSUAL BRUISING OR BLEEDING  TENDERNESS IN MOUTH AND THROAT WITH OR WITHOUT PRESENCE OF ULCERS  *URINARY PROBLEMS  *BOWEL PROBLEMS  UNUSUAL RASH Items with * indicate a potential emergency and should be followed up as soon as possible.  Feel free to call the clinic you have any questions or concerns. The clinic phone number is [phone].

## 2013-08-30 NOTE — Telephone Encounter (Signed)
Received call from patient's wife this morning. She reports he husband took a 1 mg Ativan tablet last night because he couldn't sleep. She reports it caused him to be very confused and that she found him in the basement at 0400. Determined pain is keeping him awake. She reports that the med onc doctor increased his morphine just yesterday. Advised that the combination of increased pain medication and 1 mg ativan could have caused the confusion. Encouraged her to give morphine time to work to relieve pain. Advised that the patient should try 1/2 tablet of ativan instead of a whole before May 1 MRI. She questions if a different medication other than ativan should be prescribed. Explained this Probation officer would consult with Dr. Tammi Klippel and call her back.

## 2013-08-30 NOTE — Progress Notes (Signed)
08/30/2013 Patient in to clinic today for evaluation prior to receiving treatment cycle 3. See separate Consent Form encounter for details regarding reconsent, which was obtained prior to treatment. Research blood samples were collected in the lab by venipuncture today. Patient reports back pain as his biggest complaint, interfering with his ability to sleep. Pain medications adjusted by Dr. Julien Nordmann. Patient reports an episode of confusion and hallucination three days ago when he took a dose of Ativan. Otherwise, patient has no new complaints to report. His breathing is no better, but also not worse. O2 saturation was obtained at rest on 4L [place]. Based on lab results review and history and physical by Dr. Julien Nordmann, patient condition is acceptable for continued treatment. Out of range lab results were not felt to be clinically significant. Sign for infusion given to Jaclyn Prime. Cindy S. Brigitte Pulse BSN, RN, CCRP 08/30/2013 10:59 AM

## 2013-08-30 NOTE — Progress Notes (Signed)
[place] Telephone:(336) [phone]   Fax:(336) [phone]  OFFICE VISIT PROGRESS NOTE  Glo Herring., MD [address] [place] [address]  DIAGNOSIS: Metastatic non-small cell lung cancer, adenocarcinoma with negative EGFR mutation and negative ALK gene translocation diagnosed in April 2014   PRIOR THERAPY:  1. Status post stereotactic radiotherapy to 2 brain lesions under the care of Dr. Tammi Klippel.  2. Status post palliative radiotherapy to the left lung mass.  3. Systemic chemotherapy with carboplatin for AUC of 5 and Alimta 500 mg/M2 every 3 weeks, status post 6 cycles. 4. Tarceva 150 mg by mouth daily. Started 02/25/2013. Status post approximately two month of therapy discontinued today secondary to disease progression. 5. Systemic chemotherapy with gemcitabine 1000 mg/M2 on days 1 and 8 every 3 weeks. First dose on 04/26/2013. 6. Stereotactic radiotherapy to 3 metastatic brain lesions completed on 07/04/2013.  CURRENT THERAPY: BMS immunotherapy clinical trial with Nivolumab. First cycle 07/19/2013. Status post 2 cycles.  CHEMOTHERAPY INTENT: Palliative  CURRENT # OF CHEMOTHERAPY CYCLES: 3 CURRENT ANTIEMETICS: Compazine CURRENT SMOKING STATUS: Former smoker, quit 09/28/2012  ORAL CHEMOTHERAPY AND CONSENT: Tarceva and the patient signed consent.  CURRENT BISPHOSPHONATES USE: none  PAIN MANAGEMENT: Percocet  NARCOTICS INDUCED CONSTIPATION: none  LIVING WILL AND CODE STATUS: No code Blue.  INTERVAL HISTORY: Luis Payne 73 y.o. male returns to the clinic today for followup visit accompanied by his brother. The patient continues to have the baseline shortness of breath and he is currently on home oxygen. He also on prednisone 10 mg by mouth daily. He tolerated the second cycle of his immunotherapy fairly well with no significant adverse effects. He denied having any significant fever or chills, no nausea or vomiting, no skin rash or  diarrhea. He denied having any significant recent weight loss or night sweats. He continues to have fatigue and weakness as well as low back pain. He is currently on oxycodone 5 mg but he takes it every 3 hours. He is here today to start cycle #3 of his immunotherapy.  MEDICAL HISTORY: Past Medical History  Diagnosis Date  . Substance abuse     TOBACCO  . Lung mass   . 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 Brain metastases 08/18/12    mri-  . HOH (hard of hearing)     wears hearing aids  . COPD (chronic obstructive pulmonary disease)   . Systolic heart failure     EF of 30 to 35% per echo in June 2014  . Lung cancer   . Legionella pneumonia   . CHF (congestive heart failure)     ALLERGIES:  is allergic to lorazepam.  MEDICATIONS:  Current Outpatient Prescriptions  Medication Sig Dispense Refill  . budesonide (PULMICORT) 0.25 MG/2ML nebulizer solution Take 0.25 mg by nebulization every 12 (twelve) hours.      . carvedilol (COREG) 3.125 MG tablet Take 3.125 mg by mouth 2 (two) times daily.      . digoxin (LANOXIN) 0.125 MG tablet Take 0.125 mg by mouth daily.      Marland Kitchen enoxaparin (LOVENOX) 60 MG/0.6ML injection Inject 1 mg/kg into the skin daily.      . feeding supplement, ENSURE COMPLETE, (ENSURE COMPLETE) LIQD Take 237 mLs by mouth 2 (two) times daily between meals.      . folic acid (FOLVITE) 1 MG tablet Take 1 mg by mouth daily.      . furosemide (LASIX) 20 MG  tablet Take 20 mg by mouth daily.      Marland Kitchen guaiFENesin (MUCINEX) 600 MG 12 hr tablet Take 600 mg by mouth daily.      Marland Kitchen levalbuterol (XOPENEX) 0.63 MG/3ML nebulizer solution Take 0.63 mg by nebulization every 4 (four) hours as needed for wheezing.      Marland Kitchen oxyCODONE (OXY IR/ROXICODONE) 5 MG immediate release tablet Take 1 tablet (5 mg total) by mouth every 6 (six) hours as needed for severe pain.  90 tablet  0  . OXYGEN-HELIUM IN Inhale 4 L into the lungs.      . polyethylene glycol (MIRALAX / GLYCOLAX) packet Take 17 g  by mouth 2 (two) times daily.      . predniSONE (DELTASONE) 10 MG tablet Take 1 tablet (10 mg total) by mouth daily with breakfast.  30 tablet  2  . PRESCRIPTION MEDICATION Chemo-Dr Julien Nordmann .Nivolumab infusion      . senna-docusate (SENOKOT-S) 8.6-50 MG per tablet Take 1 tablet by mouth daily.       No current facility-administered medications for this visit.    SURGICAL HISTORY:  Past Surgical History  Procedure Laterality Date  . Wrist surgery    . Lung biopsy Left 08/17/12    LUL lung mass-adenocarcinoma  . Ganglion cyst excision    . Nose surgery    . Chest tube insertion Left 08/09/2013    Procedure: INSERTION PLEURAL DRAINAGE CATHETER;  Surgeon: Gaye Pollack, MD;  Location: [facility] OR;  Service: Thoracic;  Laterality: Left;  Unsucessful Attempt    REVIEW OF SYSTEMS:  Constitutional: positive for fatigue Eyes: positive for irritation Ears, nose, mouth, throat, and face: negative Respiratory: positive for dyspnea on exertion and On supplemental oxygen, 2 L nasal cannula Cardiovascular: negative Gastrointestinal: negative Genitourinary:negative Integument/breast: negative Hematologic/lymphatic: negative Musculoskeletal:negative Neurological: negative Behavioral/Psych: negative Endocrine: negative Allergic/Immunologic: negative   PHYSICAL EXAMINATION: General appearance: alert, cooperative, fatigued and no distress Head: Normocephalic, without obvious abnormality, atraumatic Neck: no adenopathy, no JVD, supple, symmetrical, trachea midline and thyroid not enlarged, symmetric, no tenderness/mass/nodules Lymph nodes: Cervical, supraclavicular, and axillary nodes normal. Resp: wheezes bilaterally Back: symmetric, no curvature. ROM normal. No CVA tenderness. Cardio: regular rate and rhythm, S1, S2 normal, no murmur, click, rub or gallop GI: soft, non-tender; bowel sounds normal; no masses,  no organomegaly Extremities: extremities normal, atraumatic, no cyanosis or  edema Neurologic: Alert and oriented X 3, normal strength and tone. Normal symmetric reflexes. Normal coordination and gait    ECOG PERFORMANCE STATUS: 2 - Symptomatic, <50% confined to bed  Blood pressure 112/63, pulse 96, temperature 97 F (36.1 C), temperature source Oral, resp. rate 20, height 5' 5"  (1.651 m), weight 125 lb 4.8 oz (56.836 kg), SpO2 91.00%.  LABORATORY DATA: Lab Results  Component Value Date   WBC 10.0 08/30/2013   HGB 10.5* 08/30/2013   HCT 30.8* 08/30/2013   MCV 98.3* 08/30/2013   PLT 296 08/30/2013      Chemistry      Component Value Date/Time   NA 143 08/16/2013 0924   NA 140 08/09/2013 0741   K 4.1 08/16/2013 0924   K 4.7 08/09/2013 0741   CL 97 08/09/2013 0741   CL 94* 11/01/2012 1105   CO2 33* 08/16/2013 0924   CO2 33* 08/09/2013 0741   BUN 25.3 08/16/2013 0924   BUN 32* 08/09/2013 0741   CREATININE 0.9 08/16/2013 0924   CREATININE 0.93 08/09/2013 0741      Component Value Date/Time   CALCIUM 10.0 08/16/2013 2202  CALCIUM 9.9 08/09/2013 0741   ALKPHOS 102 08/16/2013 0924   ALKPHOS 107 08/09/2013 0741   AST 19 08/16/2013 0924   AST 29 08/09/2013 0741   ALT 17 08/16/2013 0924   ALT 37 08/09/2013 0741   BILITOT 0.32 08/16/2013 0924   BILITOT 0.4 08/09/2013 0741       RADIOGRAPHIC STUDIES:  ASSESSMENT AND PLAN: This is a very pleasant 73 years old white male with history of metastatic non-small cell lung cancer, adenocarcinoma status post systemic chemotherapy with carboplatin and Alimta followed by 2 months treatment with single agent Tarceva and one dose of gemcitabine discontinued secondary to her recent hospitalization with severe dyspnea.  He is currently undergoing immunotherapy with Nivolumab status post 2 cycles and tolerating his treatment fairly well. We will proceed with cycle #3 today as scheduled For COPD, the patient will continue on prednisone 10 mg by mouth daily in addition to his current inhaler and nebulizer treatment. For pain management, I would also  start him on MS Contin 30 mg by mouth every 12 hours. He was also given prescription for oxycodone 5 mg by mouth every 6 hours as needed for breakthrough pain. I would see the patient back for follow up visit in 2 weeks for reevaluation before starting his treatment with immunotherapy. He was advised to call immediately if he has any concerning symptoms in the interval. The patient voices understanding of current disease status and treatment options and is in agreement with the current care plan.  All questions were answered. The patient knows to call the clinic with any problems, questions or concerns. We can certainly see the patient much sooner if necessary.  Disclaimer: This note was dictated with voice recognition software. Similar sounding words can inadvertently be transcribed and may not be corrected upon review.  Curt Bears, MD 08/30/2013

## 2013-09-06 NOTE — Addendum Note (Signed)
Addended by: Benson Norway on: 09/06/2013 04:23 PM   Modules accepted: Medications

## 2013-09-08 ENCOUNTER — Ambulatory Visit (HOSPITAL_COMMUNITY)
Admission: RE | Admit: 2013-09-08 | Discharge: 2013-09-08 | Disposition: A | Discharge status: Home / Self Care | Payer: Medicare Other | Source: Ambulatory Visit | Attending: Internal Medicine | Admitting: Internal Medicine

## 2013-09-08 DIAGNOSIS — C3490 Malignant neoplasm of unspecified part of unspecified bronchus or lung: Secondary | ICD-10-CM | POA: Insufficient documentation

## 2013-09-08 DIAGNOSIS — C3410 Malignant neoplasm of upper lobe, unspecified bronchus or lung: Secondary | ICD-10-CM

## 2013-09-08 MED ORDER — IOHEXOL 300 MG/ML  SOLN
100.0000 mL | Freq: Once | INTRAMUSCULAR | Status: AC | PRN
  Administered 2013-09-08: 100 mL via INTRAVENOUS

## 2013-09-09 ENCOUNTER — Other Ambulatory Visit: Payer: Self-pay

## 2013-09-09 ENCOUNTER — Telehealth: Payer: Self-pay | Admitting: Oncology

## 2013-09-09 NOTE — Telephone Encounter (Signed)
Left a message that Luis Payne does not need to come in for his follow up on Monday per Dr. Tammi Klippel.

## 2013-09-09 NOTE — Telephone Encounter (Signed)
Luis Payne called back and says Luis Payne is feeling better and "is half way back to normal."  He does not want to go to the ER now.  She is wondering if he still needs to keep his appointment on Monday with Luis Payne since he did not have the MRI today.

## 2013-09-09 NOTE — Telephone Encounter (Signed)
Luis Payne said that Luis Payne has canceled his MRI today due to not feeling well and requested a nurse call him.  Called and spoke to his wife, Luis Payne.  She said he had his CT scan yesterday and had to drink contrast.  He told her he had 5-6 bowel movements last night and had trouble sleeping.  She said he is weak and is not eating very much.  He is sleeping now.  She does not think he has a fever.  Advised her to push fluids.  When he wakes up, she is going to see how he is feeling and take him to the ER if needed.  She is going to call back and let us know how he is doing.

## 2013-09-12 ENCOUNTER — Ambulatory Visit: Payer: Medicare Other

## 2013-09-12 ENCOUNTER — Ambulatory Visit: Payer: Medicare Other | Admitting: Radiation Oncology

## 2013-09-13 ENCOUNTER — Encounter: Payer: Self-pay | Admitting: Internal Medicine

## 2013-09-13 ENCOUNTER — Telehealth (HOSPITAL_COMMUNITY): Payer: Self-pay

## 2013-09-13 ENCOUNTER — Encounter: Payer: Medicare Other | Admitting: *Deleted

## 2013-09-13 ENCOUNTER — Encounter (HOSPITAL_COMMUNITY): Payer: Self-pay | Admitting: Emergency Medicine

## 2013-09-13 ENCOUNTER — Emergency Department (HOSPITAL_COMMUNITY): Payer: Medicare Other

## 2013-09-13 ENCOUNTER — Other Ambulatory Visit (HOSPITAL_BASED_OUTPATIENT_CLINIC_OR_DEPARTMENT_OTHER): Payer: Medicare Other

## 2013-09-13 ENCOUNTER — Other Ambulatory Visit: Payer: Self-pay

## 2013-09-13 ENCOUNTER — Ambulatory Visit: Payer: Self-pay

## 2013-09-13 ENCOUNTER — Inpatient Hospital Stay (HOSPITAL_COMMUNITY)
Admission: EM | Admit: 2013-09-13 | Discharge: 2013-09-16 | DRG: 180 | Disposition: A | Discharge status: Hospice | Payer: Medicare Other | Attending: Internal Medicine | Admitting: Internal Medicine

## 2013-09-13 ENCOUNTER — Ambulatory Visit (HOSPITAL_BASED_OUTPATIENT_CLINIC_OR_DEPARTMENT_OTHER): Payer: Medicare Other | Admitting: Internal Medicine

## 2013-09-13 VITALS — BP 94/46 | HR 102 | Temp 98.0°F | Resp 22 | Ht 65.0 in | Wt 131.3 lb

## 2013-09-13 DIAGNOSIS — Z87891 Personal history of nicotine dependence: Secondary | ICD-10-CM

## 2013-09-13 DIAGNOSIS — C7931 Secondary malignant neoplasm of brain: Secondary | ICD-10-CM

## 2013-09-13 DIAGNOSIS — I5022 Chronic systolic (congestive) heart failure: Secondary | ICD-10-CM

## 2013-09-13 DIAGNOSIS — J449 Chronic obstructive pulmonary disease, unspecified: Secondary | ICD-10-CM

## 2013-09-13 DIAGNOSIS — J4489 Other specified chronic obstructive pulmonary disease: Secondary | ICD-10-CM

## 2013-09-13 DIAGNOSIS — F1910 Other psychoactive substance abuse, uncomplicated: Secondary | ICD-10-CM

## 2013-09-13 DIAGNOSIS — J441 Chronic obstructive pulmonary disease with (acute) exacerbation: Secondary | ICD-10-CM

## 2013-09-13 DIAGNOSIS — R0609 Other forms of dyspnea: Secondary | ICD-10-CM

## 2013-09-13 DIAGNOSIS — C7949 Secondary malignant neoplasm of other parts of nervous system: Secondary | ICD-10-CM

## 2013-09-13 DIAGNOSIS — J9601 Acute respiratory failure with hypoxia: Secondary | ICD-10-CM

## 2013-09-13 DIAGNOSIS — IMO0002 Reserved for concepts with insufficient information to code with codable children: Secondary | ICD-10-CM

## 2013-09-13 DIAGNOSIS — J9811 Atelectasis: Secondary | ICD-10-CM

## 2013-09-13 DIAGNOSIS — D849 Immunodeficiency, unspecified: Secondary | ICD-10-CM

## 2013-09-13 DIAGNOSIS — Z79899 Other long term (current) drug therapy: Secondary | ICD-10-CM

## 2013-09-13 DIAGNOSIS — N179 Acute kidney failure, unspecified: Secondary | ICD-10-CM

## 2013-09-13 DIAGNOSIS — I2699 Other pulmonary embolism without acute cor pulmonale: Secondary | ICD-10-CM

## 2013-09-13 DIAGNOSIS — J9600 Acute respiratory failure, unspecified whether with hypoxia or hypercapnia: Secondary | ICD-10-CM | POA: Diagnosis present

## 2013-09-13 DIAGNOSIS — G4700 Insomnia, unspecified: Secondary | ICD-10-CM | POA: Diagnosis present

## 2013-09-13 DIAGNOSIS — H9190 Unspecified hearing loss, unspecified ear: Secondary | ICD-10-CM | POA: Diagnosis present

## 2013-09-13 DIAGNOSIS — C3410 Malignant neoplasm of upper lobe, unspecified bronchus or lung: Principal | ICD-10-CM

## 2013-09-13 DIAGNOSIS — D899 Disorder involving the immune mechanism, unspecified: Secondary | ICD-10-CM

## 2013-09-13 DIAGNOSIS — D696 Thrombocytopenia, unspecified: Secondary | ICD-10-CM

## 2013-09-13 DIAGNOSIS — Z86711 Personal history of pulmonary embolism: Secondary | ICD-10-CM

## 2013-09-13 DIAGNOSIS — J9620 Acute and chronic respiratory failure, unspecified whether with hypoxia or hypercapnia: Secondary | ICD-10-CM

## 2013-09-13 DIAGNOSIS — R918 Other nonspecific abnormal finding of lung field: Secondary | ICD-10-CM

## 2013-09-13 DIAGNOSIS — J90 Pleural effusion, not elsewhere classified: Secondary | ICD-10-CM

## 2013-09-13 DIAGNOSIS — Z515 Encounter for palliative care: Secondary | ICD-10-CM

## 2013-09-13 DIAGNOSIS — I509 Heart failure, unspecified: Secondary | ICD-10-CM | POA: Diagnosis present

## 2013-09-13 DIAGNOSIS — E43 Unspecified severe protein-calorie malnutrition: Secondary | ICD-10-CM

## 2013-09-13 DIAGNOSIS — G893 Neoplasm related pain (acute) (chronic): Secondary | ICD-10-CM

## 2013-09-13 DIAGNOSIS — J189 Pneumonia, unspecified organism: Secondary | ICD-10-CM

## 2013-09-13 DIAGNOSIS — R531 Weakness: Secondary | ICD-10-CM

## 2013-09-13 DIAGNOSIS — A419 Sepsis, unspecified organism: Secondary | ICD-10-CM

## 2013-09-13 DIAGNOSIS — J9690 Respiratory failure, unspecified, unspecified whether with hypoxia or hypercapnia: Secondary | ICD-10-CM

## 2013-09-13 DIAGNOSIS — E872 Acidosis, unspecified: Secondary | ICD-10-CM | POA: Diagnosis present

## 2013-09-13 DIAGNOSIS — Z833 Family history of diabetes mellitus: Secondary | ICD-10-CM

## 2013-09-13 DIAGNOSIS — Z9981 Dependence on supplemental oxygen: Secondary | ICD-10-CM

## 2013-09-13 DIAGNOSIS — R0989 Other specified symptoms and signs involving the circulatory and respiratory systems: Secondary | ICD-10-CM

## 2013-09-13 DIAGNOSIS — D638 Anemia in other chronic diseases classified elsewhere: Secondary | ICD-10-CM

## 2013-09-13 DIAGNOSIS — I10 Essential (primary) hypertension: Secondary | ICD-10-CM | POA: Diagnosis present

## 2013-09-13 DIAGNOSIS — Z66 Do not resuscitate: Secondary | ICD-10-CM | POA: Diagnosis present

## 2013-09-13 DIAGNOSIS — R0902 Hypoxemia: Secondary | ICD-10-CM

## 2013-09-13 DIAGNOSIS — R52 Pain, unspecified: Secondary | ICD-10-CM

## 2013-09-13 DIAGNOSIS — J9819 Other pulmonary collapse: Secondary | ICD-10-CM | POA: Diagnosis present

## 2013-09-13 DIAGNOSIS — K5900 Constipation, unspecified: Secondary | ICD-10-CM | POA: Diagnosis present

## 2013-09-13 DIAGNOSIS — E871 Hypo-osmolality and hyponatremia: Secondary | ICD-10-CM

## 2013-09-13 DIAGNOSIS — R0600 Dyspnea, unspecified: Secondary | ICD-10-CM

## 2013-09-13 LAB — CBC WITH DIFFERENTIAL/PLATELET
BASO%: 0.2 % (ref 0.0–2.0)
Basophils Absolute: 0 10*3/uL (ref 0.0–0.1)
Basophils Absolute: 0 10*3/uL (ref 0.0–0.1)
Basophils Relative: 0 % (ref 0–1)
EOS%: 0.3 % (ref 0.0–7.0)
Eosinophils Absolute: 0 10*3/uL (ref 0.0–0.5)
Eosinophils Absolute: 0 10*3/uL (ref 0.0–0.7)
Eosinophils Relative: 0 % (ref 0–5)
HCT: 30.8 % — ABNORMAL LOW (ref 38.4–49.9)
HEMATOCRIT: 33.8 % — AB (ref 39.0–52.0)
HGB: 10.2 g/dL — ABNORMAL LOW (ref 13.0–17.1)
Hemoglobin: 10.5 g/dL — ABNORMAL LOW (ref 13.0–17.0)
LYMPH#: 0.4 10*3/uL — AB (ref 0.9–3.3)
LYMPH%: 3.8 % — ABNORMAL LOW (ref 14.0–49.0)
LYMPHS ABS: 0.7 10*3/uL (ref 0.7–4.0)
LYMPHS PCT: 6 % — AB (ref 12–46)
MCH: 33 pg (ref 27.2–33.4)
MCH: 32.2 pg (ref 26.0–34.0)
MCHC: 33.2 g/dL (ref 32.0–36.0)
MCHC: 31.1 g/dL (ref 30.0–36.0)
MCV: 99.4 fL — ABNORMAL HIGH (ref 79.3–98.0)
MCV: 103.7 fL — AB (ref 78.0–100.0)
MONO ABS: 0.7 10*3/uL (ref 0.1–1.0)
MONO#: 0.8 10*3/uL (ref 0.1–0.9)
MONO%: 8.1 % (ref 0.0–14.0)
Monocytes Relative: 6 % (ref 3–12)
NEUT#: 8.3 10*3/uL — ABNORMAL HIGH (ref 1.5–6.5)
NEUT%: 87.6 % — ABNORMAL HIGH (ref 39.0–75.0)
Neutro Abs: 10.3 10*3/uL — ABNORMAL HIGH (ref 1.7–7.7)
Neutrophils Relative %: 88 % — ABNORMAL HIGH (ref 43–77)
PLATELETS: 385 10*3/uL (ref 150–400)
Platelets: 360 10*3/uL (ref 140–400)
RBC: 3.1 10*6/uL — AB (ref 4.20–5.82)
RBC: 3.26 MIL/uL — AB (ref 4.22–5.81)
RDW: 16.2 % — ABNORMAL HIGH (ref 11.0–14.6)
RDW: 15.9 % — ABNORMAL HIGH (ref 11.5–15.5)
WBC: 9.5 10*3/uL (ref 4.0–10.3)
WBC: 11.7 10*3/uL — AB (ref 4.0–10.5)

## 2013-09-13 LAB — PHOSPHORUS: Phosphorus: 3.8 mg/dL (ref 2.3–4.6)

## 2013-09-13 LAB — BLOOD GAS, ARTERIAL
Acid-Base Excess: 8 mmol/L — ABNORMAL HIGH (ref 0.0–2.0)
Bicarbonate: 35.4 mEq/L — ABNORMAL HIGH (ref 20.0–24.0)
DRAWN BY: 276051
FIO2: 1 %
O2 Saturation: 99 %
PH ART: 7.323 — AB (ref 7.350–7.450)
PO2 ART: 189 mmHg — AB (ref 80.0–100.0)
Patient temperature: 98.6
TCO2: 33.4 mmol/L (ref 0–100)
pCO2 arterial: 70.2 mmHg (ref 35.0–45.0)

## 2013-09-13 LAB — BASIC METABOLIC PANEL
BUN: 34 mg/dL — ABNORMAL HIGH (ref 6–23)
CHLORIDE: 96 meq/L (ref 96–112)
CO2: 37 meq/L — AB (ref 19–32)
Calcium: 10.4 mg/dL (ref 8.4–10.5)
Creatinine, Ser: 1.16 mg/dL (ref 0.50–1.35)
GFR calc Af Amer: 70 mL/min — ABNORMAL LOW (ref >90)
GFR calc non Af Amer: 60 mL/min — ABNORMAL LOW (ref >90)
Glucose, Bld: 121 mg/dL — ABNORMAL HIGH (ref 70–99)
Potassium: 4.5 mEq/L (ref 3.7–5.3)
SODIUM: 139 meq/L (ref 137–147)

## 2013-09-13 LAB — I-STAT CHEM 8, ED
BUN: 35 mg/dL — ABNORMAL HIGH (ref 6–23)
CHLORIDE: 94 meq/L — AB (ref 96–112)
Calcium, Ion: 1.4 mmol/L — ABNORMAL HIGH (ref 1.13–1.30)
Creatinine, Ser: 1.5 mg/dL — ABNORMAL HIGH (ref 0.50–1.35)
Glucose, Bld: 135 mg/dL — ABNORMAL HIGH (ref 70–99)
HEMATOCRIT: 32 % — AB (ref 39.0–52.0)
Hemoglobin: 10.9 g/dL — ABNORMAL LOW (ref 13.0–17.0)
Potassium: 4.6 mEq/L (ref 3.7–5.3)
SODIUM: 137 meq/L (ref 137–147)
TCO2: 38 mmol/L (ref 0–100)

## 2013-09-13 LAB — COMPREHENSIVE METABOLIC PANEL (CC13)
ALT: 11 U/L (ref 0–55)
ANION GAP: 10 meq/L (ref 3–11)
AST: 24 U/L (ref 5–34)
Albumin: 2.2 g/dL — ABNORMAL LOW (ref 3.5–5.0)
Alkaline Phosphatase: 124 U/L (ref 40–150)
BUN: 35.6 mg/dL — ABNORMAL HIGH (ref 7.0–26.0)
CALCIUM: 10.7 mg/dL — AB (ref 8.4–10.4)
CHLORIDE: 97 meq/L — AB (ref 98–109)
CO2: 33 meq/L — AB (ref 22–29)
Creatinine: 1.2 mg/dL (ref 0.7–1.3)
Glucose: 147 mg/dl — ABNORMAL HIGH (ref 70–140)
POTASSIUM: 4.5 meq/L (ref 3.5–5.1)
SODIUM: 141 meq/L (ref 136–145)
Total Bilirubin: 0.22 mg/dL (ref 0.20–1.20)
Total Protein: 5.8 g/dL — ABNORMAL LOW (ref 6.4–8.3)

## 2013-09-13 LAB — I-STAT TROPONIN, ED: TROPONIN I, POC: 0.03 ng/mL (ref 0.00–0.08)

## 2013-09-13 LAB — STREP PNEUMONIAE URINARY ANTIGEN: Strep Pneumo Urinary Antigen: NEGATIVE

## 2013-09-13 LAB — URINALYSIS, ROUTINE W REFLEX MICROSCOPIC
Bilirubin Urine: NEGATIVE
GLUCOSE, UA: NEGATIVE mg/dL
KETONES UR: NEGATIVE mg/dL
LEUKOCYTES UA: NEGATIVE
Nitrite: NEGATIVE
PROTEIN: NEGATIVE mg/dL
Specific Gravity, Urine: 1.012 (ref 1.005–1.030)
UROBILINOGEN UA: 0.2 mg/dL (ref 0.0–1.0)
pH: 6 (ref 5.0–8.0)

## 2013-09-13 LAB — MRSA PCR SCREENING: MRSA BY PCR: NEGATIVE

## 2013-09-13 LAB — MAGNESIUM (CC13): Magnesium: 1.9 mg/dl (ref 1.5–2.5)

## 2013-09-13 LAB — PRO B NATRIURETIC PEPTIDE: Pro B Natriuretic peptide (BNP): 1825 pg/mL — ABNORMAL HIGH (ref 0–125)

## 2013-09-13 LAB — I-STAT CG4 LACTIC ACID, ED: Lactic Acid, Venous: 3.2 mmol/L — ABNORMAL HIGH (ref 0.5–2.2)

## 2013-09-13 LAB — URINE MICROSCOPIC-ADD ON

## 2013-09-13 LAB — TSH CHCC: TSH: 3.043 m(IU)/L (ref 0.320–4.118)

## 2013-09-13 LAB — LACTATE DEHYDROGENASE (CC13): LDH: 153 U/L (ref 125–245)

## 2013-09-13 MED ORDER — ALBUTEROL (5 MG/ML) CONTINUOUS INHALATION SOLN
15.0000 mg/h | INHALATION_SOLUTION | Freq: Once | RESPIRATORY_TRACT | Status: AC
  Administered 2013-09-13: 15 mg/h via RESPIRATORY_TRACT
  Filled 2013-09-13: qty 20

## 2013-09-13 MED ORDER — FOLIC ACID 1 MG PO TABS
1.0000 mg | ORAL_TABLET | Freq: Every day | ORAL | Status: DC
  Administered 2013-09-14 – 2013-09-16 (×3): 1 mg via ORAL
  Filled 2013-09-13 (×3): qty 1

## 2013-09-13 MED ORDER — DIGOXIN 125 MCG PO TABS
0.1250 mg | ORAL_TABLET | Freq: Every day | ORAL | Status: DC
  Administered 2013-09-14 – 2013-09-16 (×3): 0.125 mg via ORAL
  Filled 2013-09-13 (×3): qty 1

## 2013-09-13 MED ORDER — ONDANSETRON HCL 4 MG/2ML IJ SOLN: 4.0000 mg | Freq: Four times a day (QID) | INTRAMUSCULAR | Status: DC | PRN

## 2013-09-13 MED ORDER — PIPERACILLIN-TAZOBACTAM 3.375 G IVPB
3.3750 g | Freq: Three times a day (TID) | INTRAVENOUS | Status: DC
  Administered 2013-09-13 – 2013-09-16 (×10): 3.375 g via INTRAVENOUS
  Filled 2013-09-13 (×17): qty 50

## 2013-09-13 MED ORDER — SENNOSIDES-DOCUSATE SODIUM 8.6-50 MG PO TABS
1.0000 | ORAL_TABLET | Freq: Every day | ORAL | Status: DC
  Administered 2013-09-14: 1 via ORAL
  Filled 2013-09-13 (×2): qty 1

## 2013-09-13 MED ORDER — GUAIFENESIN ER 600 MG PO TB12
600.0000 mg | ORAL_TABLET | Freq: Every day | ORAL | Status: DC
  Administered 2013-09-14 – 2013-09-16 (×3): 600 mg via ORAL
  Filled 2013-09-13 (×3): qty 1

## 2013-09-13 MED ORDER — CARVEDILOL 3.125 MG PO TABS
3.1250 mg | ORAL_TABLET | Freq: Two times a day (BID) | ORAL | Status: DC
  Administered 2013-09-13 – 2013-09-16 (×7): 3.125 mg via ORAL
  Filled 2013-09-13 (×7): qty 1

## 2013-09-13 MED ORDER — SODIUM CHLORIDE 0.9 % IV SOLN: 250.0000 mL | INTRAVENOUS | Status: DC | PRN

## 2013-09-13 MED ORDER — LEVALBUTEROL HCL 1.25 MG/0.5ML IN NEBU
1.2500 mg | INHALATION_SOLUTION | Freq: Three times a day (TID) | RESPIRATORY_TRACT | Status: DC
  Administered 2013-09-14 (×2): 1.25 mg via RESPIRATORY_TRACT
  Filled 2013-09-13 (×6): qty 0.5

## 2013-09-13 MED ORDER — FUROSEMIDE 10 MG/ML IJ SOLN
40.0000 mg | Freq: Once | INTRAMUSCULAR | Status: AC
  Administered 2013-09-13: 40 mg via INTRAVENOUS
  Filled 2013-09-13: qty 4

## 2013-09-13 MED ORDER — FUROSEMIDE 20 MG PO TABS
20.0000 mg | ORAL_TABLET | Freq: Every day | ORAL | Status: DC
  Administered 2013-09-14 – 2013-09-16 (×3): 20 mg via ORAL
  Filled 2013-09-13 (×3): qty 1

## 2013-09-13 MED ORDER — SODIUM CHLORIDE 0.9 % IJ SOLN: 3.0000 mL | INTRAMUSCULAR | Status: DC | PRN

## 2013-09-13 MED ORDER — ONDANSETRON HCL 4 MG PO TABS: 4.0000 mg | ORAL_TABLET | Freq: Four times a day (QID) | ORAL | Status: DC | PRN

## 2013-09-13 MED ORDER — PREDNISONE 10 MG PO TABS
10.0000 mg | ORAL_TABLET | Freq: Every day | ORAL | Status: DC
  Filled 2013-09-13: qty 1

## 2013-09-13 MED ORDER — ENOXAPARIN SODIUM 60 MG/0.6ML ~~LOC~~ SOLN
1.0000 mg/kg | SUBCUTANEOUS | Status: DC
  Filled 2013-09-13 (×2): qty 0.6

## 2013-09-13 MED ORDER — BUDESONIDE 0.25 MG/2ML IN SUSP
0.2500 mg | Freq: Two times a day (BID) | RESPIRATORY_TRACT | Status: DC
  Administered 2013-09-13 – 2013-09-16 (×6): 0.25 mg via RESPIRATORY_TRACT
  Filled 2013-09-13 (×9): qty 2

## 2013-09-13 MED ORDER — METHYLPREDNISOLONE SODIUM SUCC 125 MG IJ SOLR
60.0000 mg | Freq: Four times a day (QID) | INTRAMUSCULAR | Status: DC
  Administered 2013-09-13 – 2013-09-16 (×10): 60 mg via INTRAVENOUS
  Filled 2013-09-13 (×14): qty 0.96

## 2013-09-13 MED ORDER — POLYETHYLENE GLYCOL 3350 17 G PO PACK
17.0000 g | PACK | Freq: Two times a day (BID) | ORAL | Status: DC
  Administered 2013-09-13 – 2013-09-16 (×6): 17 g via ORAL
  Filled 2013-09-13 (×7): qty 1

## 2013-09-13 MED ORDER — METHYLPREDNISOLONE SODIUM SUCC 125 MG IJ SOLR
125.0000 mg | Freq: Once | INTRAMUSCULAR | Status: AC
  Administered 2013-09-13: 125 mg via INTRAVENOUS
  Filled 2013-09-13: qty 2

## 2013-09-13 MED ORDER — MORPHINE SULFATE ER 15 MG PO TBCR
15.0000 mg | EXTENDED_RELEASE_TABLET | Freq: Two times a day (BID) | ORAL | Status: DC
  Administered 2013-09-13: 15 mg via ORAL
  Filled 2013-09-13: qty 1

## 2013-09-13 MED ORDER — SODIUM CHLORIDE 0.9 % IJ SOLN
3.0000 mL | Freq: Two times a day (BID) | INTRAMUSCULAR | Status: DC
  Administered 2013-09-13 – 2013-09-16 (×4): 3 mL via INTRAVENOUS

## 2013-09-13 MED ORDER — LEVALBUTEROL HCL 0.63 MG/3ML IN NEBU
0.6300 mg | INHALATION_SOLUTION | RESPIRATORY_TRACT | Status: DC | PRN
  Administered 2013-09-13 – 2013-09-14 (×2): 0.63 mg via RESPIRATORY_TRACT
  Filled 2013-09-13: qty 3

## 2013-09-13 MED ORDER — ENSURE COMPLETE PO LIQD
237.0000 mL | Freq: Two times a day (BID) | ORAL | Status: DC
  Administered 2013-09-13: 237 mL via ORAL

## 2013-09-13 MED ORDER — VANCOMYCIN HCL IN DEXTROSE 750-5 MG/150ML-% IV SOLN
750.0000 mg | INTRAVENOUS | Status: DC
  Administered 2013-09-13: 750 mg via INTRAVENOUS
  Filled 2013-09-13 (×2): qty 150

## 2013-09-13 MED ORDER — OXYCODONE HCL 5 MG PO TABS: 5.0000 mg | ORAL_TABLET | Freq: Four times a day (QID) | ORAL | Status: DC | PRN

## 2013-09-13 NOTE — Progress Notes (Signed)
[place] [phone]) [phone]   Fax:([phone]) [phone]  OFFICE VISIT PROGRESS NOTE  Glo Herring., MD [address] [place] [address]  DIAGNOSIS: Metastatic non-small cell lung cancer, adenocarcinoma with negative EGFR mutation and negative ALK gene translocation diagnosed in April 2014   PRIOR THERAPY:  1. Status post stereotactic radiotherapy to 2 brain lesions under the care of Dr. Tammi Klippel.  2. Status post palliative radiotherapy to the left lung mass.  3. Systemic chemotherapy with carboplatin for AUC of 5 and Alimta 500 mg/M2 every 3 weeks, status post 6 cycles. 4. Tarceva 150 mg by mouth daily. Started 02/25/2013. Status post approximately two month of therapy discontinued today secondary to disease progression. 5. Systemic chemotherapy with gemcitabine 1000 mg/M2 on days 1 and 8 every 3 weeks. First dose on 04/26/2013. 6. Stereotactic radiotherapy to 3 metastatic brain lesions completed on 07/04/2013.  CURRENT THERAPY: BMS immunotherapy clinical trial with Nivolumab. First cycle 07/19/2013. Status post 3 cycles.  CHEMOTHERAPY INTENT: Palliative  CURRENT # OF CHEMOTHERAPY CYCLES: 3 CURRENT ANTIEMETICS: Compazine CURRENT SMOKING STATUS: Former smoker, quit 09/28/2012  ORAL CHEMOTHERAPY AND CONSENT: Tarceva and the patient signed consent.  CURRENT BISPHOSPHONATES USE: none  PAIN MANAGEMENT: Percocet  NARCOTICS INDUCED CONSTIPATION: none  LIVING WILL AND CODE STATUS: No code Blue.  INTERVAL HISTORY: Luis Payne 73 y.o. male returns to the clinic today for followup visit accompanied by his wife. The patient continues to have the baseline shortness of breath and he is currently on home oxygen. His oxygen saturation with 5 L of oxygen is currently in the range of 60-70%. The patient is still feeling fine. He tolerated the third cycle of his immunotherapy fairly well with no significant adverse effects. He denied having any  significant fever or chills, no nausea or vomiting, no skin rash or diarrhea. He denied having any significant recent weight loss or night sweats. He continues to have fatigue and weakness as well as low back pain. He had a recent repeat CT scan of the chest, abdomen and pelvis and he is here for evaluation and discussion of his scan results.  MEDICAL HISTORY: Past Medical History  Diagnosis Date  . Substance abuse     TOBACCO  . Lung mass   . 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 Brain metastases 08/18/12    mri-  . HOH (hard of hearing)     wears hearing aids  . COPD (chronic obstructive pulmonary disease)   . Systolic heart failure     EF of 30 to 35% per echo in June 2014  . Lung cancer   . Legionella pneumonia   . CHF (congestive heart failure)     ALLERGIES:  is allergic to lorazepam.  MEDICATIONS:  Current Outpatient Prescriptions  Medication Sig Dispense Refill  . budesonide (PULMICORT) 0.25 MG/2ML nebulizer solution Take 0.25 mg by nebulization every 12 (twelve) hours.      . carvedilol (COREG) 3.125 MG tablet Take 3.125 mg by mouth 2 (two) times daily.      . digoxin (LANOXIN) 0.125 MG tablet Take 0.125 mg by mouth daily.      Marland Kitchen enoxaparin (LOVENOX) 60 MG/0.6ML injection Inject 1 mg/kg into the skin daily.      . feeding supplement, ENSURE COMPLETE, (ENSURE COMPLETE) LIQD Take 237 mLs by mouth 2 (two) times daily between meals.      . folic acid (FOLVITE) 1 MG tablet Take 1 mg  by mouth daily.      . furosemide (LASIX) 20 MG tablet Take 20 mg by mouth daily.      Marland Kitchen guaiFENesin (MUCINEX) 600 MG 12 hr tablet Take 600 mg by mouth daily.      Marland Kitchen levalbuterol (XOPENEX) 0.63 MG/3ML nebulizer solution Take 0.63 mg by nebulization every 4 (four) hours as needed for wheezing.      Marland Kitchen morphine (MS CONTIN) 30 MG 12 hr tablet Take 1 tablet (30 mg total) by mouth every 12 (twelve) hours.  60 tablet  0  . oxyCODONE (OXY IR/ROXICODONE) 5 MG immediate release tablet Take 1 tablet  (5 mg total) by mouth every 6 (six) hours as needed for severe pain.  90 tablet  0  . OXYGEN-HELIUM IN Inhale 4 L into the lungs.      . polyethylene glycol (MIRALAX / GLYCOLAX) packet Take 17 g by mouth 2 (two) times daily.      . predniSONE (DELTASONE) 10 MG tablet Take 1 tablet (10 mg total) by mouth daily with breakfast.  30 tablet  2  . PRESCRIPTION MEDICATION Chemo-Dr Julien Nordmann .Nivolumab infusion      . senna-docusate (SENOKOT-S) 8.6-50 MG per tablet Take 1 tablet by mouth daily.       No current facility-administered medications for this visit.    SURGICAL HISTORY:  Past Surgical History  Procedure Laterality Date  . Wrist surgery    . Lung biopsy Left 08/17/12    LUL lung mass-adenocarcinoma  . Ganglion cyst excision    . Nose surgery    . Chest tube insertion Left 08/09/2013    Procedure: INSERTION PLEURAL DRAINAGE CATHETER;  Surgeon: Gaye Pollack, MD;  Location: [facility] OR;  Service: Thoracic;  Laterality: Left;  Unsucessful Attempt    REVIEW OF SYSTEMS:  Constitutional: positive for fatigue Eyes: negative Ears, nose, mouth, throat, and face: negative Respiratory: positive for cough, dyspnea on exertion and wheezing Cardiovascular: negative Gastrointestinal: negative Genitourinary:negative Integument/breast: negative Hematologic/lymphatic: negative Musculoskeletal:negative Neurological: negative Behavioral/Psych: negative Endocrine: negative Allergic/Immunologic: negative   PHYSICAL EXAMINATION: General appearance: alert, cooperative, fatigued and moderate distress Head: Normocephalic, without obvious abnormality, atraumatic Neck: no adenopathy, no JVD, supple, symmetrical, trachea midline and thyroid not enlarged, symmetric, no tenderness/mass/nodules Lymph nodes: Cervical, supraclavicular, and axillary nodes normal. Resp: diminished breath sounds LLL and LUL, dullness to percussion LLL and LUL and wheezes bilaterally Back: symmetric, no curvature. ROM normal. No CVA  tenderness. Cardio: regular rate and rhythm, S1, S2 normal, no murmur, click, rub or gallop GI: soft, non-tender; bowel sounds normal; no masses,  no organomegaly Extremities: extremities normal, atraumatic, no cyanosis or edema Neurologic: Alert and oriented X 3, normal strength and tone. Normal symmetric reflexes. Normal coordination and gait    ECOG PERFORMANCE STATUS: 3 - Symptomatic, >50% confined to bed  Blood pressure 94/46, pulse 102, temperature 98 F (36.7 C), temperature source Oral, resp. rate 17, height 5' 5"  (1.651 m), weight 131 lb 4.8 oz (59.557 kg), SpO2 70.00%, peak flow 5 L/min.  LABORATORY DATA: Lab Results  Component Value Date   WBC 9.5 09/13/2013   HGB 10.2* 09/13/2013   HCT 30.8* 09/13/2013   MCV 99.4* 09/13/2013   PLT 360 09/13/2013      Chemistry      Component Value Date/Time   NA 139 08/30/2013 0932   NA 140 08/09/2013 0741   K 4.1 08/30/2013 0932   K 4.7 08/09/2013 0741   CL 97 08/09/2013 0741   CL 94* 11/01/2012 1105  CO2 34* 08/30/2013 0932   CO2 33* 08/09/2013 0741   BUN 25.6 08/30/2013 0932   BUN 32* 08/09/2013 0741   CREATININE 1.0 08/30/2013 0932   CREATININE 0.93 08/09/2013 0741      Component Value Date/Time   CALCIUM 10.3 08/30/2013 0932   CALCIUM 9.9 08/09/2013 0741   ALKPHOS 132 08/30/2013 0932   ALKPHOS 107 08/09/2013 0741   AST 21 08/30/2013 0932   AST 29 08/09/2013 0741   ALT 13 08/30/2013 0932   ALT 37 08/09/2013 0741   BILITOT 0.34 08/30/2013 0932   BILITOT 0.4 08/09/2013 0741       RADIOGRAPHIC STUDIES: Ct Chest W Contrast  09/08/2013   CLINICAL DATA:  Metastatic lung cancer.  Restaging. RECIST 1.1  EXAM: CT CHEST, ABDOMEN, AND PELVIS WITH CONTRAST  TECHNIQUE: Multidetector CT imaging of the chest, abdomen and pelvis was performed following the standard protocol during bolus administration of intravenous contrast.  CONTRAST:  167m OMNIPAQUE IOHEXOL 300 MG/ML  SOLN  COMPARISON:  07/14/2013.  RECIST 1.1  Target Lesions:  1.  Mass like opacity in  the anterior left lung apex measuring 38 mm on image 11 of series 2, previously 34 mm 2. Left anterior pleural metastatic disease measuring 22 mm cm on image 45 of series 2, previously 29 mm. 3. Left abdominal paraaortic retroperitoneal lymph node measuring 21 cm on image 60 of series 2, previously 1.4 cm.  Non-target Lesions:  1. 10 mm right middle lobe pulmonary nodule on image 36 of series 4, previously 9 mm.  FINDINGS:   CT CHEST FINDINGS  Extensive tumor involvement involving the left hemi thorax. There is progressive worsening of aeration to the entire left lung. A small amount of aerated lung remaining in the left apex. Large, loculated left pleural effusion with extensive trans pleural spread of tumor in the left hemi thorax is identified. There is evidence of chest wall invasion, which appears progressive when compared with the previous exam. Index lesion within the left posterior hemi thorax measures 1.4 x 2.4 cm, image 42/series 2. Previously 1.3 x 2.1 cm. Similar appearance of pleural tumor extending along the left heart border. Pericardial invasion cannot be excluded.  Advanced changes of an bolus emphysema identified within the right lung. Right middle lobe pulmonary nodule is identified measuring 1 cm, image 36/series 4. Previously 0.9 cm. 7 mm nodule in the superior segment of right lower lobe is identified and appears new from the previous exam.  The heart size is normal. A small amount of pericardial fluid is identified overlying the left ventricular apex, image 37/series 2. This is similar to previous exam. Left cardio phrenic angle tumor is again noted. Soft tissue nodule measures 2.5 x 1.6 cm, image 50/series 2. Previously this measured 2.3 x 1.5 cm. 1 cm sub- carinal lymph node is stable, image 26/series 2. Right paratracheal lymph node measures 7 mm and is unchanged from previous exam, image 23/series 2.  There is a right supraclavicular lymph node measuring 1 cm, image 5/series 2. This is  compared with 9 mm previously. No axillary adenopathy identified.  Review of the visualized bony structures shows no aggressive lytic or sclerotic bone lesions.    CT ABDOMEN AND PELVIS FINDINGS  New abdominal ascites noted. Mild diffuse low attenuation throughout the liver is identified compatible with hepatic steatosis. The gallbladder is normal. No biliary dilatation. Normal appearance of the pancreas. Progressive irregular and nodular thickening of the left hemidiaphragm is identified. Evidence of infradiaphragmatic diaphragmatic spread of tumor  into the left hemi thorax is identified. There is abnormal soft tissue thickening along the undersurface of the left hemidiaphragm and extending around the spleen.  The adrenal glands remain normal. Nonobstructing bilateral renal calculi are again identified. The urinary bladder appears normal.  Calcified atherosclerotic disease involves the abdominal aorta and its branches. Progressive upper abdominal adenopathy is noted. Gastrohepatic ligament lymph node measures 1.3 cm, image 53/ series 2. Previously 1 cm. Left periaortic lymph node measures 2.1 cm, image 60/series 2. Previously 1.4 cm. No pelvic or inguinal adenopathy identified. There is a mild to moderate amount of ascites within the abdomen and pelvis which is new from previous exam. Mild infiltration of the omental fat is also noted, suspicious for peritoneal disease.  Mild wall thickening along the ventral aspect of the stomach is noted in may reflect direct extension of tumor from the left hemidiaphragm. There is a mild increase in caliber of the small bowel loops which now measure up to 2.7 cm. There is a moderate stool burden identified throughout the colon. No evidence for colonic obstruction.  Review of the bony structures shows no aggressive lytic or sclerotic bone lesions.   IMPRESSION: CT chest:  1. Overall progression of tumor within the chest. There has been worsening aeration to the left lung with  progression of left hemi thorax pleural and chest wall involvement. 2. Pulmonary nodules within the right lung are slightly increased in size from previous exam. CT abdomen and pelvis:  1. Suspect trans diaphragmatic spread of tumor into the left upper quadrant of the abdomen.  * There is a new abnormal soft tissue which is encasing the spleen. Infiltration of the omentum is noted and there is abdominal ascites. Findings are worrisome for peritoneal spread of tumor. *Progression of upper abdominal adenopathy.   Electronically Signed   By: Kerby Moors M.D.   On: 09/08/2013 12:01   ASSESSMENT AND PLAN: This is a very pleasant 73 years old white male with history of metastatic non-small cell lung cancer, adenocarcinoma status post systemic chemotherapy with carboplatin and Alimta followed by 2 months treatment with single agent Tarceva and one dose of gemcitabine discontinued secondary to her recent hospitalization with severe dyspnea.  He is currently undergoing immunotherapy with Nivolumab status post 3 cycles and tolerating his treatment fairly well. Unfortunately his recent CT scan of the chest, abdomen and pelvis showed continuous disease progression. His oxygen saturation with 5 L of oxygen was in the range of 40-70%.  I discussed the scan results with the patient and his wife. I recommended for him to discontinue treatment at this point and consider only palliative and hospice care. I would see the patient to the emergency Department at [facility] for admission and adjustment of his breathing treatment for comfort For COPD, the patient will continue on prednisone in addition to his current inhaler and nebulizer treatment. We will consider increasing the dose of steroids to help with his shortness of breath. For pain management, I would also start him on MS Contin 30 mg by mouth every 12 hours. He was also given prescription for oxycodone 5 mg by mouth every 6 hours as needed for  breakthrough pain. He was advised to call immediately if he has any concerning symptoms in the interval. The patient voices understanding of current disease status and treatment options and is in agreement with the current care plan.  All questions were answered. The patient knows to call the clinic with any problems, questions or concerns. We  can certainly see the patient much sooner if necessary.  Disclaimer: This note was dictated with voice recognition software. Similar sounding words can inadvertently be transcribed and may not be corrected upon review.  Curt Bears, MD 09/13/2013

## 2013-09-13 NOTE — ED Notes (Signed)
Pt from [facility]. Low 02 sats 45% RA EDP Ward in to evaluate pt upon arrival to room. NRB placed upon arrival. Denies CP.

## 2013-09-13 NOTE — Progress Notes (Signed)
ANTIBIOTIC CONSULT NOTE - INITIAL  Pharmacy Consult for Vancomycin/Zosyn Indication: pneumonia  Allergies  Allergen Reactions  . Lorazepam     "made me disoriented and hallucinated"    Patient Measurements: Height: 5\' 5"  (165.1 cm) Weight: 131 lb (59.421 kg) IBW/kg (Calculated) : 61.5  Vital Signs: Temp: 97.7 F (36.5 C) (05/05 1115) Temp src: Rectal (05/05 1115) BP: 118/45 mmHg (05/05 1058) Pulse Rate: 100 (05/05 1058)      Labs:  Recent Labs  09/13/13 0936 09/13/13 0936 09/13/13 1100 09/13/13 1134  WBC 9.5  --  11.7*  --   HGB 10.2*  --  10.5* 10.9*  PLT 360  --  385  --   CREATININE  --  1.2  --  1.50*   Estimated Creatinine Clearance: 36.3 ml/min (by C-G formula based on Cr of 1.5). No results found for this basename: VANCOTROUGH, VANCOPEAK, VANCORANDOM, GENTTROUGH, GENTPEAK, GENTRANDOM, TOBRATROUGH, TOBRAPEAK, TOBRARND, AMIKACINPEAK, AMIKACINTROU, AMIKACIN,  in the last 72 hours   Microbiology: No results found for this or any previous visit (from the past 720 hour(s)).  Medical History: Past Medical History  Diagnosis Date  . Substance abuse     TOBACCO  . Lung mass   . 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 Brain metastases 08/18/12    mri-  . HOH (hard of hearing)     wears hearing aids  . COPD (chronic obstructive pulmonary disease)   . Systolic heart failure     EF of 30 to 35% per echo in June 2014  . Lung cancer   . Legionella pneumonia   . CHF (congestive heart failure)     Medications:  Scheduled:  Infusions:  Assessment:  73 yo male with history of metastatic non-small cell lung CA, adenocarcinoma  admitted with severe dyspnea. Vanco and Zosyn to be started for PNA  Goal of Therapy:  Vancomycin trough level 15-20 mcg/ml  Plan:  . Will start Zosyn 3.375 Gm IV Q8h over 4 hours infusion . Vancomycin 750mg  IV Q24h . Will check Vanco trough level at steady state or if renal function changes . Will f/u Blood culture  Audrea Muscat [facility] 09/13/2013,12:04 PM

## 2013-09-13 NOTE — ED Notes (Signed)
DR ward notified about high lactic acid on res-a.Marland Kitchenklj

## 2013-09-13 NOTE — H&P (Signed)
[facility] History and Physical  Luis Payne EQA:[id number] DOB: 1941/03/20 DOA: 09/13/2013  Referring physician: ED physician PCP: Glo Herring., MD   Chief Complaint: shortness of breath   HPI:  73 y.o. M with history of lung cancer, last chemotherapy 2 weeks ago, COPD on 5 L of oxygen at home, systolic heart failure, pulmonary embolus on Lovenox, who presented to [facility] ED with main concern of several days of progressively worsening shortness of breath associated with productive cough of yellow sputum, subjective fevers, chills. He was at the cancer center today for chemotherapy treatment and was found to be hypoxic int he office with oxygen saturation in 30's.   In ED, pt in mild respiratory distress with oxygen via  , saturating in high 80's. TRH asked to admit to SDU for further evaluation.   Assessment and Plan: Active Problems: Acute hypoxic respiratory failure - secondary to progressive lung malignancy and COPD, unlikely PNA but unclear given some subjective fevers at home and cough - admit to SDU - treat COPD with BD's scheduled and as needed, also place on Solumedrol and taper down as clinically indicated - pt received Vancomycin and Zosyn in ED and will continue for now, if no clear infectious etiology identified may be able to stop ABX - follow upon sputum culture, urine legionella and strep pneumo  - IR consulted for possible left therapeutic thoracentesis, they will see pt in AM  - will also continue Lasix per home medical regimen  - pt is now DNR and has opted not to proceed with chemo any longer - PCT consulted  History of PE - continue Lovenox per pharmacy Systolic CHF - continue Lasix as noted above  HTN - continue home medical regimen  Acute renal failure - repeat BMP in AM  Radiological Exams on Admission: Dg Chest Port 1 View  09/13/2013  Near complete opacification of the left hemi thorax. This appears relatively stable to slightly progressive.   Emphysematous changes in the right lung along with chronic pulmonary scarring. Low lung volumes with vascular crowding atelectasis.    Code Status: DNR Family Communication: Pt and wife at bedside Disposition Plan: Admit for further evaluation     Review of Systems:  Constitutional: Negative for diaphoresis.  HENT: Negative for hearing loss, ear pain, nosebleeds, congestion, sore throat, neck pain, tinnitus and ear discharge.   Eyes: Negative for blurred vision, double vision, photophobia, pain, discharge and redness.  Respiratory: Per HPI   Cardiovascular: Negative for chest pain, palpitations, orthopnea, claudication and leg swelling.  Gastrointestinal:  Negative for heartburn, constipation, blood in stool and melena.  Genitourinary: Negative for dysuria, urgency, frequency, hematuria and flank pain.  Musculoskeletal: Negative for myalgias, back pain, joint pain and falls.  Skin: Negative for itching and rash.  Neurological: Negative for tingling, tremors, sensory change, speech change, focal weakness, loss of consciousness and headaches.  Endo/Heme/Allergies: Negative for environmental allergies and polydipsia. Does not bruise/bleed easily.  Psychiatric/Behavioral: Negative for suicidal ideas. The patient is not nervous/anxious.      Past Medical History  Diagnosis Date  . Substance abuse     TOBACCO  . Lung mass   . 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 Brain metastases 08/18/12    mri-  . HOH (hard of hearing)     wears hearing aids  . COPD (chronic obstructive pulmonary disease)   . Systolic heart failure     EF of 30 to 35% per echo in June 2014  .  Lung cancer   . Legionella pneumonia   . CHF (congestive heart failure)     Past Surgical History  Procedure Laterality Date  . Wrist surgery    . Lung biopsy Left 08/17/12    LUL lung mass-adenocarcinoma  . Ganglion cyst excision    . Nose surgery    . Chest tube insertion Left 08/09/2013    Procedure: INSERTION  PLEURAL DRAINAGE CATHETER;  Surgeon: Gaye Pollack, MD;  Location: [facility] OR;  Service: Thoracic;  Laterality: Left;  Unsucessful Attempt    Social History:  reports that he quit smoking about a year ago. His smoking use included Cigarettes. He has a 60 pack-year smoking history. He has never used smokeless tobacco. He reports that he does not drink alcohol or use illicit drugs.  Allergies  Allergen Reactions  . Lorazepam     "made me disoriented and hallucinated"    Family History  Problem Relation Age of Onset  . COPD Father   . Cancer Sister   . Diabetes Brother     Prior to Admission medications   Medication Sig Start Date End Date Taking? Authorizing Provider  budesonide (PULMICORT) 0.25 MG/2ML nebulizer solution Take 0.25 mg by nebulization every 12 (twelve) hours. 08/04/13  Yes Barton Dubois, MD  carvedilol (COREG) 3.125 MG tablet Take 3.125 mg by mouth 2 (two) times daily. 12/29/12  Yes Burtis Junes, NP  digoxin (LANOXIN) 0.125 MG tablet Take 0.125 mg by mouth daily. 12/29/12  Yes Burtis Junes, NP  enoxaparin (LOVENOX) 60 MG/0.6ML injection Inject 1 mg/kg into the skin daily. 08/04/13  Yes Barton Dubois, MD  feeding supplement, ENSURE COMPLETE, (ENSURE COMPLETE) LIQD Take 237 mLs by mouth 2 (two) times daily between meals. 10/22/12  Yes Theodis Blaze, MD  folic acid (FOLVITE) 1 MG tablet Take 1 mg by mouth daily.   Yes Historical Provider, MD  furosemide (LASIX) 20 MG tablet Take 20 mg by mouth daily. 08/04/13  Yes Barton Dubois, MD  guaiFENesin (MUCINEX) 600 MG 12 hr tablet Take 600 mg by mouth daily. 12/14/12  Yes Historical Provider, MD  levalbuterol Penne Lash) 0.63 MG/3ML nebulizer solution Take 0.63 mg by nebulization every 4 (four) hours as needed for wheezing. 11/02/12  Yes Curt Bears, MD  morphine (MS CONTIN) 30 MG 12 hr tablet Take 15 mg by mouth every 12 (twelve) hours.   Yes Historical Provider, MD  oxyCODONE (OXY IR/ROXICODONE) 5 MG immediate release tablet Take 1  tablet (5 mg total) by mouth every 6 (six) hours as needed for severe pain. 08/30/13  Yes Curt Bears, MD  polyethylene glycol [facility] / GLYCOLAX) packet Take 17 g by mouth 2 (two) times daily.   Yes Historical Provider, MD  predniSONE (DELTASONE) 10 MG tablet Take 1 tablet (10 mg total) by mouth daily with breakfast. 08/16/13  Yes Curt Bears, MD  senna-docusate (SENOKOT-S) 8.6-50 MG per tablet Take 1 tablet by mouth daily.   Yes Historical Provider, MD  OXYGEN-HELIUM IN Inhale 4 L into the lungs.    Historical Provider, MD  PRESCRIPTION MEDICATION Chemo-Dr Julien Nordmann .Nivolumab infusion    Historical Provider, MD    Physical Exam: Filed Vitals:   09/13/13 1058 09/13/13 1115  BP: 118/45   Pulse: 100   Temp:  97.7 F (36.5 C)  TempSrc:  Rectal  Height: 5\' 5"  (1.651 m)   Weight: 59.421 kg (131 lb)   SpO2: 45%     Physical Exam  Constitutional: Appears tired and in mild distress  due to dyspnea  HENT: Normocephalic. External right and left ear normal. Dry MM Eyes: Conjunctivae and EOM are normal. PERRLA, no scleral icterus.  Neck: Normal ROM. Neck supple. No JVD. No tracheal deviation. No thyromegaly.  CVS: RRR, S1/S2 +, no murmurs, no gallops, no carotid bruit.  Pulmonary: Course breath sounds, very diminished on the left, wheezing  Abdominal: Soft. BS +,  no distension, tenderness, rebound or guarding.  Musculoskeletal: Normal range of motion. No edema and no tenderness.  Lymphadenopathy: No lymphadenopathy noted, cervical, inguinal. Neuro: Alert. Normal reflexes, muscle tone coordination. No cranial nerve deficit. Skin: Skin is warm and dry. No rash noted. Not diaphoretic. No erythema. No pallor.  Psychiatric: Normal mood and affect.   Labs on Admission:  Basic Metabolic Panel:  Recent Labs Lab 09/13/13 0936 09/13/13 0936 09/13/13 1134  NA  --  141 137  K  --  4.5 4.6  CL  --   --  94*  CO2  --  33*  --   GLUCOSE  --  147* 135*  BUN  --  35.6* 35*  CREATININE  --   1.2 1.50*  CALCIUM  --  10.7*  --   MG  --  1.9  --   PHOS 3.8  --   --    Liver Function Tests:  Recent Labs Lab 09/13/13 0936  AST 24  ALT 11  ALKPHOS 124  BILITOT 0.22  PROT 5.8*  ALBUMIN 2.2*   CBC:  Recent Labs Lab 09/13/13 0936 09/13/13 1100 09/13/13 1134  WBC 9.5 11.7*  --   NEUTROABS 8.3* 10.3*  --   HGB 10.2* 10.5* 10.9*  HCT 30.8* 33.8* 32.0*  MCV 99.4* 103.7*  --   PLT 360 385  --    EKG: Normal sinus rhythm, no ST/T wave changes  Theodis Blaze, MD  [facility] Pager [phone]  If 7PM-7AM, please contact night-coverage [email or web address] Password [email or web address] 09/13/2013, 12:11 PM

## 2013-09-13 NOTE — ED Provider Notes (Addendum)
TIME SEEN: 10:56 AM  CHIEF COMPLAINT: Shortness of breath, hypoxia  HPI: Pt is a 73 y.o. M with history of tobacco abuse, lung cancer currently on chemotherapy (last was 2 weeks ago), COPD on 5 L of oxygen at home, systolic heart failure, pulmonary embolus on Lovenox who presents emergency Department with hypoxia. Patient was at the [facility] today for a chemotherapy treatment and was found to be hypoxic with a oxygen saturation in the 30s. He was sent to the emergency department for further evaluation. He states his shortness of breath has been worse for the past 2 days. He denies any chest pain. No fevers or chills. He has had a cough that has been nonproductive. He also has had some swelling of his lower extremities bilaterally.  ROS: See HPI Constitutional: no fever  Eyes: no drainage  ENT: no runny nose   Cardiovascular:  no chest pain  Resp:  SOB  GI: no vomiting GU: no dysuria Integumentary: no rash  Allergy: no hives  Musculoskeletal: no leg swelling  Neurological: no slurred speech ROS otherwise negative  PAST MEDICAL HISTORY/PAST SURGICAL HISTORY:  Past Medical History  Diagnosis Date  . Substance abuse     TOBACCO  . Lung mass   . 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 Brain metastases 08/18/12    mri-  . HOH (hard of hearing)     wears hearing aids  . COPD (chronic obstructive pulmonary disease)   . Systolic heart failure     EF of 30 to 35% per echo in June 2014  . Lung cancer   . Legionella pneumonia   . CHF (congestive heart failure)     MEDICATIONS:  Prior to Admission medications   Medication Sig Start Date End Date Taking? Authorizing Provider  budesonide (PULMICORT) 0.25 MG/2ML nebulizer solution Take 0.25 mg by nebulization every 12 (twelve) hours. 08/04/13   Barton Dubois, MD  carvedilol (COREG) 3.125 MG tablet Take 3.125 mg by mouth 2 (two) times daily. 12/29/12   Burtis Junes, NP  digoxin (LANOXIN) 0.125 MG tablet Take 0.125 mg by mouth  daily. 12/29/12   Burtis Junes, NP  enoxaparin (LOVENOX) 60 MG/0.6ML injection Inject 1 mg/kg into the skin daily. 08/04/13   Barton Dubois, MD  feeding supplement, ENSURE COMPLETE, (ENSURE COMPLETE) LIQD Take 237 mLs by mouth 2 (two) times daily between meals. 10/22/12   Theodis Blaze, MD  folic acid (FOLVITE) 1 MG tablet Take 1 mg by mouth daily.    Historical Provider, MD  furosemide (LASIX) 20 MG tablet Take 20 mg by mouth daily. 08/04/13   Barton Dubois, MD  guaiFENesin (MUCINEX) 600 MG 12 hr tablet Take 600 mg by mouth daily. 12/14/12   Historical Provider, MD  levalbuterol Penne Lash) 0.63 MG/3ML nebulizer solution Take 0.63 mg by nebulization every 4 (four) hours as needed for wheezing. 11/02/12   Curt Bears, MD  morphine (MS CONTIN) 30 MG 12 hr tablet Take 1 tablet (30 mg total) by mouth every 12 (twelve) hours. 08/30/13   Curt Bears, MD  oxyCODONE (OXY IR/ROXICODONE) 5 MG immediate release tablet Take 1 tablet (5 mg total) by mouth every 6 (six) hours as needed for severe pain. 08/30/13   Curt Bears, MD  OXYGEN-HELIUM IN Inhale 4 L into the lungs.    Historical Provider, MD  polyethylene glycol (MIRALAX / GLYCOLAX) packet Take 17 g by mouth 2 (two) times daily.    Historical Provider, MD  predniSONE (DELTASONE)  10 MG tablet Take 1 tablet (10 mg total) by mouth daily with breakfast. 08/16/13   Curt Bears, MD  PRESCRIPTION MEDICATION Chemo-Dr Julien Nordmann .Nivolumab infusion    Historical Provider, MD  senna-docusate (SENOKOT-S) 8.6-50 MG per tablet Take 1 tablet by mouth daily.    Historical Provider, MD    ALLERGIES:  Allergies  Allergen Reactions  . Lorazepam     "made me disoriented and hallucinated"    SOCIAL HISTORY:  History  Substance Use Topics  . Smoking status: Former Smoker -- 1.00 packs/day for 60 years    Types: Cigarettes    Quit date: 09/27/2012  . Smokeless tobacco: Never Used  . Alcohol Use: No    FAMILY HISTORY: Family History  Problem Relation Age  of Onset  . COPD Father   . Cancer Sister   . Diabetes Brother     EXAM: There were no vitals taken for this visit. CONSTITUTIONAL: Alert and oriented and responds appropriately to questions. Chronically ill-appearing, in mild distress HEAD: Normocephalic EYES: Conjunctivae clear, PERRL ENT: normal nose; no rhinorrhea; moist mucous membranes; pharynx without lesions noted NECK: Supple, no meningismus, no LAD  CARD: RRR; S1 and S2 appreciated; no murmurs, no clicks, no rubs, no gallops RESP: Normal chest excursion without splinting; patient is hypoxic with oxygen saturation of 48% on his normal 5 L of oxygen, he is mildly cyanotic, mild to moderate respiratory distress, patient has diffuse rhonchorous breath sounds. Expiratory wheezing, he is markedly diminished on his left side diffusely ABD/GI: Normal bowel sounds; non-distended; soft, non-tender, no rebound, no guarding, umbilical hernia that is reducible BACK:  The back appears normal and is non-tender to palpation, there is no CVA tenderness EXT: Normal ROM in all joints; non-tender to palpation; no edema; normal capillary refill; no cyanosis    SKIN: Normal color for age and race; warm NEURO: Moves all extremities equally PSYCH: The patient's mood and manner are appropriate. Grooming and personal hygiene are appropriate.  MEDICAL DECISION MAKING: Patient here with respiratory distress, hypoxia. Differential diagnosis includes COPD exacerbation, pneumonia, PE, CHF, pneumothorax, pleural effusion. Patient's oxygen has improved to 100% on a nonrebreather. Have discussed with patient and his wife who both agree the patient is a DO NOT RESUSCITATE/DO NOT INTUBATE. We'll obtain labs, EKG, portable chest x-ray. We'll give continuous albuterol treatment, Solu-Medrol, Lasix and broad spectrum antibiotics. Patient will need admission.  ED PROGRESS: Patient's lung sounds continue to improve with continuous albuterol. His ABG shows mild respiratory  acidosis with PCO2 of 70. His chest x-ray shows a large left-sided pleural effusion that is chronic. Family reports they have tried a thoracentesis in the past without any success but they would be interested in trying this again today. His labs show a elevated lactate of 3.2. His BNP is 1825. Troponin negative.   Patient stable on nonrebreather, resting comfortably. His respiratory distress is improving. He will however have oxygen saturation that we'll fluctuate between 85% to 100%.   Discuss with Dr. Doyle Askew with hospitalist service for admission to step down.     Date: 09/13/2013 10:48 AM  Rate: 105  Rhythm: sinus tachycardia  QRS Axis: normal  Intervals: normal  ST/T Wave abnormalities: normal  Conduction Disutrbances: none  Narrative Interpretation: sinus tachycardia; non-specific changes diffusely          CRITICAL CARE Performed by: Delice Bison Jannine Abreu   Total critical care time: 60 minutes  Critical care time was exclusive of separately billable procedures and treating other patients.  Critical care  was necessary to treat or prevent imminent or life-threatening deterioration.  Critical care was time spent personally by me on the following activities: development of treatment plan with patient and/or surrogate as well as nursing, discussions with consultants, evaluation of patient's response to treatment, examination of patient, obtaining history from patient or surrogate, ordering and performing treatments and interventions, ordering and review of laboratory studies, ordering and review of radiographic studies, pulse oximetry and re-evaluation of patient's condition.   [place], DO 09/13/13 [place], DO 09/13/13 1315

## 2013-09-13 NOTE — Progress Notes (Signed)
Patient brought to exam area due to increased breathing distress noted by escort staff. Original O2 saturation showing at 70% on 4lpm, spouse has increased to 6lpm in waiting area. This nurse in to assess patient, states he feels his breathing getting worse, so we haved tapered the oxygen down slowly to 3lpm having a rise in his saturationto approx 68%. Dr Julien Nordmann in to see patient and spouse and has been advised to report to ER for prompt medical attention to get him more stable and comfortable. Patient being escorted to ER by Rudene Anda, RN, per Delsa Sale, charge RN in ER, patient is to be brought straight to trauma rooms in the recess area.

## 2013-09-13 NOTE — ED Notes (Signed)
Bed: RESB Expected date:  Expected time:  Means of arrival:  Comments: Cancer center

## 2013-09-14 ENCOUNTER — Encounter: Payer: Self-pay | Admitting: Radiation Therapy

## 2013-09-14 DIAGNOSIS — J9600 Acute respiratory failure, unspecified whether with hypoxia or hypercapnia: Secondary | ICD-10-CM

## 2013-09-14 DIAGNOSIS — I509 Heart failure, unspecified: Secondary | ICD-10-CM

## 2013-09-14 DIAGNOSIS — E43 Unspecified severe protein-calorie malnutrition: Secondary | ICD-10-CM | POA: Insufficient documentation

## 2013-09-14 DIAGNOSIS — Z86711 Personal history of pulmonary embolism: Secondary | ICD-10-CM

## 2013-09-14 DIAGNOSIS — Z7901 Long term (current) use of anticoagulants: Secondary | ICD-10-CM

## 2013-09-14 DIAGNOSIS — N179 Acute kidney failure, unspecified: Secondary | ICD-10-CM

## 2013-09-14 DIAGNOSIS — I5022 Chronic systolic (congestive) heart failure: Secondary | ICD-10-CM

## 2013-09-14 DIAGNOSIS — C3410 Malignant neoplasm of upper lobe, unspecified bronchus or lung: Principal | ICD-10-CM

## 2013-09-14 DIAGNOSIS — C7949 Secondary malignant neoplasm of other parts of nervous system: Secondary | ICD-10-CM

## 2013-09-14 DIAGNOSIS — J449 Chronic obstructive pulmonary disease, unspecified: Secondary | ICD-10-CM

## 2013-09-14 DIAGNOSIS — R52 Pain, unspecified: Secondary | ICD-10-CM

## 2013-09-14 DIAGNOSIS — J9819 Other pulmonary collapse: Secondary | ICD-10-CM

## 2013-09-14 DIAGNOSIS — C7931 Secondary malignant neoplasm of brain: Secondary | ICD-10-CM

## 2013-09-14 LAB — BASIC METABOLIC PANEL
BUN: 32 mg/dL — ABNORMAL HIGH (ref 6–23)
CO2: 39 mEq/L — ABNORMAL HIGH (ref 19–32)
Calcium: 9.5 mg/dL (ref 8.4–10.5)
Chloride: 92 mEq/L — ABNORMAL LOW (ref 96–112)
Creatinine, Ser: 1.15 mg/dL (ref 0.50–1.35)
GFR calc Af Amer: 71 mL/min — ABNORMAL LOW (ref >90)
GFR, EST NON AFRICAN AMERICAN: 61 mL/min — AB (ref >90)
Glucose, Bld: 132 mg/dL — ABNORMAL HIGH (ref 70–99)
POTASSIUM: 5.1 meq/L (ref 3.7–5.3)
SODIUM: 137 meq/L (ref 137–147)

## 2013-09-14 LAB — LEGIONELLA ANTIGEN, URINE: Legionella Antigen, Urine: NEGATIVE

## 2013-09-14 LAB — CBC
HCT: 28.4 % — ABNORMAL LOW (ref 39.0–52.0)
Hemoglobin: 8.9 g/dL — ABNORMAL LOW (ref 13.0–17.0)
MCH: 31.9 pg (ref 26.0–34.0)
MCHC: 31.3 g/dL (ref 30.0–36.0)
MCV: 101.8 fL — ABNORMAL HIGH (ref 78.0–100.0)
Platelets: 316 10*3/uL (ref 150–400)
RBC: 2.79 MIL/uL — AB (ref 4.22–5.81)
RDW: 15.6 % — ABNORMAL HIGH (ref 11.5–15.5)
WBC: 9.6 10*3/uL (ref 4.0–10.5)

## 2013-09-14 MED ORDER — BOOST PLUS PO LIQD
237.0000 mL | Freq: Three times a day (TID) | ORAL | Status: DC
  Administered 2013-09-14 – 2013-09-16 (×7): 237 mL via ORAL
  Filled 2013-09-14 (×10): qty 237

## 2013-09-14 MED ORDER — VANCOMYCIN HCL 500 MG IV SOLR
500.0000 mg | Freq: Two times a day (BID) | INTRAVENOUS | Status: DC
  Administered 2013-09-14 – 2013-09-15 (×3): 500 mg via INTRAVENOUS
  Filled 2013-09-14 (×5): qty 500

## 2013-09-14 MED ORDER — MORPHINE SULFATE (CONCENTRATE) 10 MG /0.5 ML PO SOLN
10.0000 mg | ORAL | Status: DC | PRN
  Administered 2013-09-14: 10 mg via ORAL
  Administered 2013-09-15: 20 mg via ORAL
  Filled 2013-09-14: qty 0.5
  Filled 2013-09-14: qty 1

## 2013-09-14 MED ORDER — ALPRAZOLAM 0.5 MG PO TABS
0.5000 mg | ORAL_TABLET | ORAL | Status: DC | PRN
  Administered 2013-09-14 – 2013-09-16 (×2): 0.5 mg via ORAL
  Filled 2013-09-14 (×2): qty 1

## 2013-09-14 MED ORDER — IPRATROPIUM BROMIDE 0.02 % IN SOLN
0.5000 mg | Freq: Four times a day (QID) | RESPIRATORY_TRACT | Status: DC
  Administered 2013-09-14 (×2): 0.5 mg via RESPIRATORY_TRACT
  Filled 2013-09-14 (×2): qty 2.5

## 2013-09-14 MED ORDER — LEVALBUTEROL HCL 1.25 MG/0.5ML IN NEBU
1.2500 mg | INHALATION_SOLUTION | Freq: Four times a day (QID) | RESPIRATORY_TRACT | Status: DC
  Administered 2013-09-15 (×2): 1.25 mg via RESPIRATORY_TRACT
  Filled 2013-09-14 (×6): qty 0.5

## 2013-09-14 MED ORDER — MORPHINE SULFATE ER 30 MG PO TBCR
30.0000 mg | EXTENDED_RELEASE_TABLET | Freq: Two times a day (BID) | ORAL | Status: DC
  Administered 2013-09-14 – 2013-09-16 (×6): 30 mg via ORAL
  Filled 2013-09-14 (×6): qty 1

## 2013-09-14 MED ORDER — IPRATROPIUM BROMIDE 0.02 % IN SOLN
0.5000 mg | Freq: Four times a day (QID) | RESPIRATORY_TRACT | Status: DC
  Administered 2013-09-15 (×2): 0.5 mg via RESPIRATORY_TRACT
  Filled 2013-09-14 (×2): qty 2.5

## 2013-09-14 MED ORDER — ENOXAPARIN SODIUM 60 MG/0.6ML ~~LOC~~ SOLN
1.0000 mg/kg | Freq: Every day | SUBCUTANEOUS | Status: DC
  Administered 2013-09-14 – 2013-09-15 (×3): 55 mg via SUBCUTANEOUS
  Filled 2013-09-14 (×4): qty 0.6

## 2013-09-14 MED ORDER — SENNOSIDES-DOCUSATE SODIUM 8.6-50 MG PO TABS
2.0000 | ORAL_TABLET | Freq: Every day | ORAL | Status: DC
  Administered 2013-09-15: 2 via ORAL
  Filled 2013-09-14 (×3): qty 2

## 2013-09-14 NOTE — Consult Note (Signed)
Palliative Medicine Team at [facility]  Date: 09/14/2013   Patient Name: Luis Payne  DOB: Jul 23, 1940  MRN: [id number]  Age / Sex: 73 y.o., male   PCP: Redmond School, MD Referring Physician: Domenic Polite, MD  HPI/Reason for Consultation: 73 yo man with progressive adenocarcinoma of the lung despite multiple rounds of chemotherapy and immunotherapy. Currently in ICU with respiratory failure. PMT consulted for goals of care.   Participants in Discussion: Patient, wife, son at bediside  Goals/Summary of Discussion:  1. Code Status:  DNR  2. Scope of Treatment:  Focus on comfort and QOL  No additional chemotherapy  Treat reversible conditions ie. Complete this round of antibiotics, fluids, other PO meds to be continued  Treat symptom of air hunger aggressively  Regular diet as tolerated for comfort  Minimize invasive or uncomfortable interventions  3. Assessment/Plan:  Primary Diagnoses  1. Lung Cancer 2. COPD, advanced 3. Malnutrition  Prognosis: <6 months, more likely 3-4 weeks  PPS 30%   Active Symptoms 1. Severe Dyspnea 2. Fatigue 3. Decubitus 4. Insomnia   Begin Nasal Cannula Oxygen with an Oximizer-titrate as needed  Resume MsContin 30 BID  Roxanol 10mg  SL q2 prn for breakthrough dyspnea     4. Palliative Prophylaxis:   Bowel Regimen: yes  Terminal Secretions: NA  Breakthrough Pain and Dyspnea: yes   Agitation and Delirium: yes  Nausea: yes  5. Psychosocial Spiritual Asssessment/Interventions:  Patient and Family Adjustment to Illness/Prognosis: Cooping well appropriate grief  Spiritual Concerns or Needs: none expressed, will ask Chaplain to see for additional support   6. Disposition:  Patient agreeable to home with Hospice, wife initially hesitant but I provided education and addressed her concerns/myths surrounding hospice care at home. Will place CM order to offer home hospice choice. Ok to transition out of ICU to med-surg  floor prior to discharge- plan to get patient to point of maximal improvement if possible prior to discharge.   Time:  Greater than 50%  of this time was spent counseling and coordinating care related to the above assessment and plan.  Signed by: Acquanetta Chain, DO  09/14/2013, 9:55 AM  Please contact Palliative Medicine Team phone at [phone] for questions and concerns.

## 2013-09-14 NOTE — Progress Notes (Signed)
INITIAL NUTRITION ASSESSMENT  Pt meets criteria for severe MALNUTRITION in the context of chronic illness as evidenced by <75% estimated energy intake in the past month in addition to pt with severe fat loss throughout upper body.  DOCUMENTATION CODES Per approved criteria  -Severe malnutrition in the context of chronic illness   INTERVENTION: - Boost Plus TID - Encouraged increased meal and supplement intake - Recommend MD discuss appetite stimulant or possible enteral nutrition if intake continues to remain poor - Will continue to monitor   NUTRITION DIAGNOSIS: Increased nutrient needs related to metastatic non small cell lung CA as evidenced by MD notes.   Goal: Pt to consume >90% of meals/supplements  Monitor:  Weights, labs, intake  Reason for Assessment: Malnutrition screening tool   73 y.o. male  Admitting Dx: Shortness of breath   ASSESSMENT: Pt with history of metastatic non-small cell lung cancer, last chemotherapy 2 weeks ago, COPD on 5 L of oxygen at home, systolic heart failure, pulmonary embolus on Lovenox, who presented to [facility] ED with main concern of several days of progressively worsening shortness of breath associated with productive cough of yellow sputum, subjective fevers, chills. He was at the cancer center yesterday for chemotherapy treatment and was found to be hypoxic.  Met with pt and wife who report pt with poor appetite for a long time. Wife reports pt has recently just being eating 1 good meal/day and drinking 1-2 Boost/day in addition to some V8 juice in the morning, but states a few days ago he didn't want to drink any fluids. Wife reports pt's weight has been stable but thinks this is due to fluid. States she does not add any salt to his food.   Potassium, magnesium, and phosphorus WNL  Nutrition Focused Physical Exam:  Subcutaneous Fat:  Orbital Region: moderate fat loss  Upper Arm Region: severe fat loss with moderate muscle wasting Thoracic  and Lumbar Region: moderate fat loss  Muscle:  Temple Region: moderate fat loss  Clavicle Bone Region: severe fat loss Clavicle and Acromion Bone Region: severe fat loss Scapular Bone Region: NA Dorsal Hand: severe fat loss    Edema: +2 LLE edema    Height: Ht Readings from Last 1 Encounters:  09/13/13 5' 5"  (1.651 m)    Weight: Wt Readings from Last 1 Encounters:  09/14/13 129 lb 10.1 oz (58.8 kg)    Ideal Body Weight: 136 lbs   % Ideal Body Weight: 95%  Wt Readings from Last 10 Encounters:  09/14/13 129 lb 10.1 oz (58.8 kg)  09/13/13 131 lb 4.8 oz (59.557 kg)  08/30/13 125 lb 4.8 oz (56.836 kg)  08/16/13 125 lb 11.2 oz (57.017 kg)  08/09/13 121 lb (54.885 kg)  08/09/13 121 lb (54.885 kg)  08/08/13 121 lb 9.6 oz (55.157 kg)  07/31/13 123 lb 3.2 oz (55.883 kg)  07/19/13 126 lb 12.8 oz (57.516 kg)  07/12/13 127 lb (57.607 kg)    Usual Body Weight: 121 lbs per wife  % Usual Body Weight: 107%  BMI:  Body mass index is 21.57 kg/(m^2).  Estimated Nutritional Needs: Kcal: 1800-2000 Protein: 90-110g Fluid: 1.8-2L/day  Skin: +2 LLE edema, stage 2 right buttocks pressure ulcer, stage 1 left hip pressure ulcer  Diet Order: General  EDUCATION NEEDS: -No education needs identified at this time   Intake/Output Summary (Last 24 hours) at 09/14/13 1132 Last data filed at 09/14/13 0500  Gross per 24 hour  Intake    406 ml  Output  1900 ml  Net  -1494 ml    Last BM: 5/5   Labs:   Recent Labs Lab 09/13/13 0936 09/13/13 0936 09/13/13 1100 09/13/13 1134 09/14/13 0255  NA  --  141 139 137 137  K  --  4.5 4.5 4.6 5.1  CL  --   --  96 94* 92*  CO2  --  33* 37*  --  39*  BUN  --  35.6* 34* 35* 32*  CREATININE  --  1.2 1.16 1.50* 1.15  CALCIUM  --  10.7* 10.4  --  9.5  MG  --  1.9  --   --   --   PHOS 3.8  --   --   --   --   GLUCOSE  --  147* 121* 135* 132*    CBG (last 3)  No results found for this basename: GLUCAP,  in the last 72  hours  Scheduled Meds: . budesonide  0.25 mg Nebulization Q12H  . carvedilol  3.125 mg Oral BID WC  . digoxin  0.125 mg Oral Daily  . enoxaparin  1 mg/kg Subcutaneous QHS  . feeding supplement (ENSURE COMPLETE)  237 mL Oral BID BM  . folic acid  1 mg Oral Daily  . furosemide  20 mg Oral Daily  . guaiFENesin  600 mg Oral Daily  . ipratropium  0.5 mg Nebulization Q6H  . levalbuterol  1.25 mg Nebulization TID PC & HS  . methylPREDNISolone (SOLU-MEDROL) injection  60 mg Intravenous Q6H  . morphine  30 mg Oral Q12H  . piperacillin-tazobactam (ZOSYN)  IV  3.375 g Intravenous 3 times per day  . polyethylene glycol  17 g Oral BID  . [START ON 09/15/2013] senna-docusate  2 tablet Oral Daily  . sodium chloride  3 mL Intravenous Q12H  . vancomycin  500 mg Intravenous Q12H    Continuous Infusions:   Past Medical History  Diagnosis Date  . Substance abuse     TOBACCO  . Lung mass   . 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 Brain metastases 08/18/12    mri-  . HOH (hard of hearing)     wears hearing aids  . COPD (chronic obstructive pulmonary disease)   . Systolic heart failure     EF of 30 to 35% per echo in June 2014  . Lung cancer   . Legionella pneumonia   . CHF (congestive heart failure)     Past Surgical History  Procedure Laterality Date  . Wrist surgery    . Lung biopsy Left 08/17/12    LUL lung mass-adenocarcinoma  . Ganglion cyst excision    . Nose surgery    . Chest tube insertion Left 08/09/2013    Procedure: INSERTION PLEURAL DRAINAGE CATHETER;  Surgeon: Gaye Pollack, MD;  Location: [facility];  Service: Thoracic;  Laterality: Left;  Unsucessful Attempt    Mikey College MS, [place], [place] Pager [phone] After Hours Pager

## 2013-09-14 NOTE — Progress Notes (Signed)
[place]  Telephone:([phone]) [phone]   Requesting Provider: [facility]  Consulting Provider: Dr. Julien Nordmann  Primary Oncologist: Dr. Roney Marion CONSULTATION  NOTE  Reason for Consultation: Lung Cancer  HPI: Luis Payne is a 73 year old male with a history of lung cancer needed to [facility] with progressive shortness of breath associated with productive cough and yellow sputum, subjective fevers and chills. On 09/13/2013, and he had been seen at the [facility], as he was due for chemotherapy treatment, but was found to be hypoxic, with oxygen saturation in the 40s to 70s in spite of O2 at 5 liters . At the emergency department, the patient experienced mild respiratory distress, despite oxygen [place], for which he was admitted to the SDU for further evaluation and management of the symptoms. He denies any fever chills, nausea or vomiting. He denies any skin rashes or diarrhea. He denies any significant weight loss or night sweats prior to admission. He continues to experience fatigue and weakness, as well as low back pain controlled with medications. His recent CT scan of the chest abdomen and pelvis These show disease progression, for which Tarceva was discontinued as of 09/13/2013.  A CXR on admission showed  near complete opacification of the left hemi thorax. IR to see for possible therapeutic thoracentesis today. Palliative and hospice care being entertained for comfort only. Were kindly notify the patient's admission.  Oncological History:  DIAGNOSIS: Metastatic non-small cell lung cancer, adenocarcinoma with negative EGFR mutation and negative ALK gene translocation diagnosed in April 2014   PRIOR THERAPY:  1. Status post stereotactic radiotherapy to 2 brain lesions under the care of Dr. Tammi Klippel.  2. Status post palliative radiotherapy to the left lung mass.  3. Systemic chemotherapy with carboplatin for AUC of 5 and Alimta 500 mg/M2 every 3  weeks, status post 6 cycles. 4. Tarceva 150 mg by mouth daily. Started 02/25/2013. Status post approximately two month of therapy discontinued on 5/5 secondary to disease progression. 5. Systemic chemotherapy with gemcitabine 1000 mg/M2 on days 1 and 8 every 3 weeks. First dose on 04/26/2013. 6. Stereotactic radiotherapy to 3 metastatic brain lesions completed on 07/04/2013.  CURRENT THERAPY: BMS immunotherapy clinical trial with Nivolumab. First cycle 07/19/2013. Status post 3 cycles.   CHEMOTHERAPY INTENT: Palliative     Past Medical History  Diagnosis Date  . Substance abuse     TOBACCO  . Lung mass   . 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 Brain metastases 08/18/12    mri-  . HOH (hard of hearing)     wears hearing aids  . COPD (chronic obstructive pulmonary disease)   . Systolic heart failure     EF of 30 to 35% per echo in June 2014  . Lung cancer   . Legionella pneumonia   . CHF (congestive heart failure)      MEDICATIONS:  Scheduled Meds: . budesonide  0.25 mg Nebulization Q12H  . carvedilol  3.125 mg Oral BID WC  . digoxin  0.125 mg Oral Daily  . enoxaparin  1 mg/kg Subcutaneous QHS  . feeding supplement (ENSURE COMPLETE)  237 mL Oral BID BM  . folic acid  1 mg Oral Daily  . furosemide  20 mg Oral Daily  . guaiFENesin  600 mg Oral Daily  . levalbuterol  1.25 mg Nebulization TID PC & HS  . methylPREDNISolone (SOLU-MEDROL) injection  60 mg Intravenous Q6H  . morphine  30  mg Oral Q12H  . piperacillin-tazobactam (ZOSYN)  IV  3.375 g Intravenous 3 times per day  . polyethylene glycol  17 g Oral BID  . senna-docusate  1 tablet Oral Daily  . sodium chloride  3 mL Intravenous Q12H  . vancomycin  750 mg Intravenous Q24H   Continuous Infusions:  PRN Meds:.sodium chloride, levalbuterol, ondansetron (ZOFRAN) IV, ondansetron, oxyCODONE, sodium chloride  ALLERGIES:  Allergies  Allergen Reactions  . Lorazepam     "made me disoriented and hallucinated"     Family History  Problem Relation Age of Onset  . COPD Father   . Cancer Sister   . Diabetes Brother      Past Surgical History  Procedure Laterality Date  . Wrist surgery    . Lung biopsy Left 08/17/12    LUL lung mass-adenocarcinoma  . Ganglion cyst excision    . Nose surgery    . Chest tube insertion Left 08/09/2013    Procedure: INSERTION PLEURAL DRAINAGE CATHETER;  Surgeon: Gaye Pollack, MD;  Location: [facility];  Service: Thoracic;  Laterality: Left;  Unsucessful Attempt    History   Social History  . Marital Status: Married    Spouse Name: N/A    Number of Children: N/A  . Years of Education: N/A   Occupational History  . Not on file.   Social History Main Topics  . Smoking status: Former Smoker -- 1.00 packs/day for 60 years    Types: Cigarettes    Quit date: 09/27/2012  . Smokeless tobacco: Never Used  . Alcohol Use: No  . Drug Use: No  . Sexual Activity: Not Currently   Other Topics Concern  . Not on file   Social History Narrative   ** Merged History Encounter **         PHYSICAL EXAMINATION:   ECOG PERFORMANCE STATUS: 3 - Symptomatic, >50% confined to bed   Filed Vitals:   09/14/13 0700  BP: 134/57  Pulse: 101  Temp:   Resp: 12   Filed Weights   09/13/13 1211 09/13/13 1325 09/14/13 0600  Weight: 131 lb (59.421 kg) 127 lb 6.8 oz (57.8 kg) 129 lb 10.1 oz (58.8 kg)    PHYSICAL EXAMINATION: General appearance: alert, cooperative, fatigued and moderate distress  Head: Normocephalic, without obvious abnormality, atraumatic  Neck: no adenopathy, no JVD, supple, symmetrical, trachea midline and thyroid not enlarged, symmetric, no tenderness/mass/nodules  Lymph nodes: Cervical, supraclavicular, and axillary nodes normal.  Resp: diminished breath sounds bilaterally at the bases No wheezes this morning.  Back: symmetric, no curvature. ROM normal. No CVA tenderness.  Cardio: regular rate and rhythm, S1, S2 normal, no murmur, click, rub or gallop   GI: soft, non-tender; bowel sounds normal; no masses, no organomegaly  Extremities: extremities normal, atraumatic, no cyanosis or edema  Neurologic: Alert and oriented X 3, normal strength and tone. Normal symmetric reflexes.    LABORATORY/RADIOLOGY DATA:   Recent Labs Lab 09/13/13 0936 09/13/13 1100 09/13/13 1134 09/14/13 0255  WBC 9.5 11.7*  --  9.6  HGB 10.2* 10.5* 10.9* 8.9*  HCT 30.8* 33.8* 32.0* 28.4*  PLT 360 385  --  316  MCV 99.4* 103.7*  --  101.8*  MCH 33.0 32.2  --  31.9  MCHC 33.2 31.1  --  31.3  RDW 16.2* 15.9*  --  15.6*  LYMPHSABS 0.4* 0.7  --   --   MONOABS 0.8 0.7  --   --   EOSABS 0.0 0.0  --   --  BASOSABS 0.0 0.0  --   --     CMP    Recent Labs Lab 09/13/13 0936 09/13/13 1100 09/13/13 1134 09/14/13 0255  NA 141 139 137 137  K 4.5 4.5 4.6 5.1  CL  --  96 94* 92*  CO2 33* 37*  --  39*  GLUCOSE 147* 121* 135* 132*  BUN 35.6* 34* 35* 32*  CREATININE 1.2 1.16 1.50* 1.15  CALCIUM 10.7* 10.4  --  9.5  MG 1.9  --   --   --   AST 24  --   --   --   ALT 11  --   --   --   ALKPHOS 124  --   --   --   BILITOT 0.22  --   --   --         Component Value Date/Time   BILITOT 0.22 09/13/2013 0936   BILITOT 0.4 08/09/2013 0741     Urinalysis    Component Value Date/Time   COLORURINE YELLOW 09/13/2013 1114   APPEARANCEUR CLEAR 09/13/2013 1114   LABSPEC 1.012 09/13/2013 1114   PHURINE 6.0 09/13/2013 1114   GLUCOSEU NEGATIVE 09/13/2013 1114   HGBUR LARGE* 09/13/2013 1114   BILIRUBINUR NEGATIVE 09/13/2013 1114   KETONESUR NEGATIVE 09/13/2013 1114   PROTEINUR NEGATIVE 09/13/2013 1114   UROBILINOGEN 0.2 09/13/2013 1114   NITRITE NEGATIVE 09/13/2013 1114   LEUKOCYTESUR NEGATIVE 09/13/2013 1114     Liver Function Tests:  Recent Labs Lab 09/13/13 0936  AST 24  ALT 11  ALKPHOS 124  BILITOT 0.22  PROT 5.8*  ALBUMIN 2.2*   Thyroid function studies  Recent Labs  09/13/13 0937  TSH 3.043    Radiology Studies:  Ct Chest W Contrast  09/08/2013    COMPARISON:  07/14/2013.  RECIST 1.1  Target Lesions:  1.  Mass like opacity in the anterior left lung apex measuring 38 mm on image 11 of series 2, previously 34 mm 2. Left anterior pleural metastatic disease measuring 22 mm cm on image 45 of series 2, previously 29 mm. 3. Left abdominal paraaortic retroperitoneal lymph node measuring 21 cm on image 60 of series 2, previously 1.4 cm.  Non-target Lesions:  1. 10 mm right middle lobe pulmonary nodule on image 36 of series 4, previously 9 mm.  FINDINGS: CT CHEST FINDINGS  Extensive tumor involvement involving the left hemi thorax. There is progressive worsening of aeration to the entire left lung. A small amount of aerated lung remaining in the left apex. Large, loculated left pleural effusion with extensive trans pleural spread of tumor in the left hemi thorax is identified. There is evidence of chest wall invasion, which appears progressive when compared with the previous exam. Index lesion within the left posterior hemi thorax measures 1.4 x 2.4 cm, image 42/series 2. Previously 1.3 x 2.1 cm. Similar appearance of pleural tumor extending along the left heart border. Pericardial invasion cannot be excluded.  Advanced changes of an bolus emphysema identified within the right lung. Right middle lobe pulmonary nodule is identified measuring 1 cm, image 36/series 4. Previously 0.9 cm. 7 mm nodule in the superior segment of right lower lobe is identified and appears new from the previous exam.  The heart size is normal. A small amount of pericardial fluid is identified overlying the left ventricular apex, image 37/series 2. This is similar to previous exam. Left cardio phrenic angle tumor is again noted. Soft tissue nodule measures 2.5 x 1.6 cm, image 50/series  2. Previously this measured 2.3 x 1.5 cm. 1 cm sub- carinal lymph node is stable, image 26/series 2. Right paratracheal lymph node measures 7 mm and is unchanged from previous exam, image 23/series 2.  There is a  right supraclavicular lymph node measuring 1 cm, image 5/series 2. This is compared with 9 mm previously. No axillary adenopathy identified.  Review of the visualized bony structures shows no aggressive lytic or sclerotic bone lesions. n: 09/08/2013 12:01   Ct Abdomen Pelvis W Contrast  09/08/2013    COMPARISON:  07/14/2013.  RECIST 1.1  Target Lesions:  1.  Mass like opacity in the anterior left lung apex measuring 38 mm on image 11 of series 2, previously 34 mm 2. Left anterior pleural metastatic disease measuring 22 mm cm on image 45 of series 2, previously 29 mm. 3. Left abdominal paraaortic retroperitoneal lymph node measuring 21 cm on image 60 of series 2, previously 1.4 cm.  Non-target Lesions:  1. 10 mm right middle lobe pulmonary nodule on image 36 of series 4, previously 9 mm.  CT ABDOMEN AND PELVIS FINDINGS  New abdominal ascites noted. Mild diffuse low attenuation throughout the liver is identified compatible with hepatic steatosis. The gallbladder is normal. No biliary dilatation. Normal appearance of the pancreas. Progressive irregular and nodular thickening of the left hemidiaphragm is identified. Evidence of infradiaphragmatic diaphragmatic spread of tumor into the left hemi thorax is identified. There is abnormal soft tissue thickening along the undersurface of the left hemidiaphragm and extending around the spleen.  The adrenal glands remain normal. Nonobstructing bilateral renal calculi are again identified. The urinary bladder appears normal.  Calcified atherosclerotic disease involves the abdominal aorta and its branches. Progressive upper abdominal adenopathy is noted. Gastrohepatic ligament lymph node measures 1.3 cm, image 53/ series 2. Previously 1 cm. Left periaortic lymph node measures 2.1 cm, image 60/series 2. Previously 1.4 cm. No pelvic or inguinal adenopathy identified. There is a mild to moderate amount of ascites within the abdomen and pelvis which is new from previous exam. Mild  infiltration of the omental fat is also noted, suspicious for peritoneal disease.  Mild wall thickening along the ventral aspect of the stomach is noted in may reflect direct extension of tumor from the left hemidiaphragm. There is a mild increase in caliber of the small bowel loops which now measure up to 2.7 cm. There is a moderate stool burden identified throughout the colon. No evidence for colonic obstruction.  Review of the bony structures shows no aggressive lytic or sclerotic bone lesions.  IMPRESSION: CT chest:  1. Overall progression of tumor within the chest. There has been worsening aeration to the left lung with progression of left hemi thorax pleural and chest wall involvement. 2. Pulmonary nodules within the right lung are slightly increased in size from previous exam. CT abdomen and pelvis:  1. Suspect trans diaphragmatic spread of tumor into the left upper quadrant of the abdomen.  * There is a new abnormal soft tissue which is encasing the spleen. Infiltration of the omentum is noted and there is abdominal ascites. Findings are worrisome for peritoneal spread of tumor. *Progression of upper abdominal adenopathy.   Electronically Signed   By: Kerby Moors M.D.   On: 09/08/2013 12:01   Dg Chest Port 1 View  09/13/2013   CLINICAL DATA:  Shortness of breath.  Left-sided lung cancer.  EXAM: PORTABLE CHEST - 1 VIEW  COMPARISON:  CT scan 08/08/2013 and chest CT 09/08/2013.  FINDINGS: Complete opacification  of the left hemi thorax is again demonstrated. Minimal aerated lung in the left apex. The right lung demonstrates chronic emphysematous changes and pulmonary scarring. Low lung volumes with vascular crowding and basilar atelectasis.  IMPRESSION: Near complete opacification of the left hemi thorax. This appears relatively stable to slightly progressive.  Emphysematous changes in the right lung along with chronic pulmonary scarring. Low lung volumes with vascular crowding atelectasis.   Electronically  Signed   By: Kalman Jewels M.D.   On: 09/13/2013 11:53     ASSESSMENT AND PLAN:  1. Metastatic non-small cell lung cancer to the brain, adenocarcinoma status post systemic chemotherapy with carboplatin and Alimta followed by 2 months treatment with single agent Tarceva and one dose of gemcitabine discontinued secondary to her recent hospitalization with severe dyspnea.  He is currently undergoing immunotherapy with Nivolumab status post 3 cycles and tolerating his treatment fairly well.  Unfortunately his recent CT scan of the chest, abdomen and pelvis showed continuous disease progression. Treatments are therefore discontinued. Hospice evaluation for comfort care only to be performed today.  2. Oxygen Desaturation/ COPD: His  oxygen saturation with 5 L of oxygen was in the range of 40-70%. For which he was [facility] by [facility] to the SCU for evaluation and management of symptoms. Continue breathing treatments with nebulizers and oxygen, as well as steroids which have been increased in order to improve his shortness of breath.He is also on Zosyn IV for antibiotic coverage. IR consulted for possible left therapeutic thoracentesis today due to abnormal CXR remarkable for near complete opacification of the left hemithorax  3. Pain, controlled with current regimen, including MS Contin 30 mg by mouth every 12 hours initiated on 09/13/13 and  oxycodone 5 mg by mouth every 6 hours as needed for breakthrough pain. This regimen is to be modified for comfort by Hospice.   4. History of PE. On Lovenox per Pharmacy  5. DNR  Other medical issues as per admitting team.     **Disclaimer: This note was dictated with voice recognition software. Similar sounding words can inadvertently be transcribed and this note may contain transcription errors which may not have been corrected upon publication of note.Rondel Jumbo, PA-C 09/14/2013, 8:26 AM  ADDENDUM: Hematology/Oncology Attending:   The patient is seen and examined today. I agree with the above note. His wife is at the bedside. This is a very pleasant 72 years old white male with metastatic non-small cell lung cancer, adenocarcinoma status post several chemotherapy regimens and most recently treated with immunotherapy with Nivolumab on a clinical trial but unfortunately the last CT scan of the chest, abdomen and pelvis showed evidence for disease progression. The patient is in the clinic yesterday when he was noted to have oxygen saturation less than 70% even with 5 L of home oxygen. He was admitted to the intensive care unit and currently on facemask was oxygen saturation above 90%. He is currently on empiric coverage with Zosyn and vancomycin an additional to Solu-Medrol 60 mg every 6 hours. He is feeling a little bit better today. Regarding the metastatic non-small cell lung cancer, the patient is not a good candidate for any further treatment at this point. I discussed with the patient and his wife consideration of palliative care and hospice referral and they are in agreement with the current plan. I will consult the palliative care team. Thank you so much for taking good care of Luis Payne, I will continue to follow the  patient with you and assist in his management an as-needed basis.

## 2013-09-14 NOTE — Progress Notes (Signed)
CARE MANAGEMENT NOTE 09/14/2013  Patient:  Luis Payne, Luis Payne   Account Number:  [id number]  Date Initiated:  09/14/2013  Documentation initiated by:  Cierria Height  Subjective/Objective Assessment:   pt with hx of copd presented to mdo with increased wob and o2 sat at 30%, admitted to sdu on 100%fio2 and nrb     Action/Plan:   home when stable   Anticipated DC Date:  09/17/2013   Anticipated DC Plan:  [place]  In-house referral  NA      DC Planning Services  CM consult      [facility] Choice  NA   Choice offered to / List presented to:  NA      DME agency  NA        [facility] agency  NA   Status of service:  In process, will continue to follow Medicare Important Message given?  NA - LOS <3 / Initial given by admissions (If response is "NO", the following Medicare IM given date fields will be blank) Date Medicare IM given:   Date Additional Medicare IM given:    Discharge Disposition:    Per UR Regulation:  Reviewed for med. necessity/level of care/duration of stay  If discussed at [place] of Stay Meetings, dates discussed:    Comments:  [id number]/Brogan Martis Eldridge Dace, BSN, [place] [phone] Chart Reviewed for discharge and hospital needs. Discharge needs at time of review: None present will follow for needs. Review of patient progress due on [id number].

## 2013-09-14 NOTE — Progress Notes (Signed)
ANTIBIOTIC CONSULT NOTE - FOLLOW UP  Pharmacy Consult for Vancomycin, Zosyn Indication: pneumonia  Allergies  Allergen Reactions  . Lorazepam     "made me disoriented and hallucinated"    Patient Measurements: Height: 5\' 5"  (165.1 cm) Weight: 129 lb 10.1 oz (58.8 kg) IBW/kg (Calculated) : 61.5  Vital Signs: Temp: 98.1 F (36.7 C) (05/06 0400) Temp src: Axillary (05/06 0400) BP: 122/42 mmHg (05/06 0934) Pulse Rate: 114 (05/06 0934) Intake/Output from previous day: 05/05 0701 - 05/06 0700 In: 406 [P.O.:150; I.V.:106; IV Piggyback:150] Out: 1900 [Urine:1900]  Labs:  Recent Labs  09/13/13 0936  09/13/13 1100 09/13/13 1134 09/14/13 0255  WBC 9.5  --  11.7*  --  9.6  HGB 10.2*  --  10.5* 10.9* 8.9*  PLT 360  --  385  --  316  CREATININE  --   < > 1.16 1.50* 1.15  < > = values in this interval not displayed. Estimated Creatinine Clearance: 46.9 ml/min (by C-G formula based on Cr of 1.15). No results found for this basename: VANCOTROUGH, VANCOPEAK, VANCORANDOM, GENTTROUGH, GENTPEAK, GENTRANDOM, TOBRATROUGH, TOBRAPEAK, TOBRARND, AMIKACINPEAK, AMIKACINTROU, AMIKACIN,  in the last 72 hours   Microbiology: Recent Results (from the past 720 hour(s))  CULTURE, BLOOD (ROUTINE X 2)     Status: None   Collection Time    09/13/13 10:55 AM      Result Value Ref Range Status   Specimen Description BLOOD RIGHT FOREARM   Final   Special Requests BOTTLES DRAWN AEROBIC AND ANAEROBIC 1.5CC EACH   Final   Culture  Setup Time     Final   Value: 09/13/2013 14:25     Performed at [facility]   Culture     Final   Value:        BLOOD CULTURE RECEIVED NO GROWTH TO DATE CULTURE WILL BE HELD FOR 5 DAYS BEFORE ISSUING A FINAL NEGATIVE REPORT     Performed at [facility]   Report Status PENDING   Incomplete  CULTURE, BLOOD (ROUTINE X 2)     Status: None   Collection Time    09/13/13 11:00 AM      Result Value Ref Range Status   Specimen Description BLOOD RIGHT ANTECUBITAL    Final   Special Requests BOTTLES DRAWN AEROBIC AND ANAEROBIC [facility] EACH   Final   Culture  Setup Time     Final   Value: 09/13/2013 14:26     Performed at [facility]   Culture     Final   Value:        BLOOD CULTURE RECEIVED NO GROWTH TO DATE CULTURE WILL BE HELD FOR 5 DAYS BEFORE ISSUING A FINAL NEGATIVE REPORT     Performed at [facility]   Report Status PENDING   Incomplete  MRSA PCR SCREENING     Status: None   Collection Time    09/13/13  1:20 PM      Result Value Ref Range Status   MRSA by PCR NEGATIVE  NEGATIVE Final   Comment:            The GeneXpert MRSA Assay (FDA     approved for NASAL specimens     only), is one component of a     comprehensive MRSA colonization     surveillance program. It is not     intended to diagnose MRSA     infection nor to guide or     monitor treatment for  MRSA infections.    Anti-infectives   Start     Dose/Rate Route Frequency Ordered Stop   09/13/13 1300  piperacillin-tazobactam (ZOSYN) IVPB 3.375 g     3.375 g 12.5 mL/hr over 240 Minutes Intravenous 3 times per day 09/13/13 1218     09/13/13 1300  vancomycin (VANCOCIN) IVPB 750 mg/150 ml premix     750 mg 150 mL/hr over 60 Minutes Intravenous Every 24 hours 09/13/13 1219        Assessment: 73 yo male with history of metastatic non-small cell lung CA, adenocarcinoma admitted with severe dyspnea. Recent admission in March with vancomycin and cefepime used for r/o HCAP.  Pharmacy is consulted to dose vancomycin and Zosyn for HCAP.   Day #2 vancomycin and Zosyn  Tmax: afeb  WBCs: 9.6  Renal: Scr improved to 1.15, CrCl ~ 47 ml/min   Goal of Therapy:  Vancomycin trough level 15-20 mcg/ml  Plan:   Continue Zosyn 3.375g IV Q8H infused over 4hrs.  Increase to Vancomycin 500mg  IV q12h.  Measure Vanc trough at steady state.  Follow up renal fxn and culture results.   Gretta Arab PharmD, BCPS Pager [phone] 09/14/2013 10:40 AM

## 2013-09-14 NOTE — Progress Notes (Addendum)
Rt placed pt on Oxymizer Device at 6plm sats dropped in the 70's. Rt had to place pt back on 6lpm  with 100% nrb sats 91%. Pt is requiring to much O2 for and Oxymizer. Wife at bedside pt in no distress.

## 2013-09-14 NOTE — Progress Notes (Signed)
Lovenox Injection and Coreg PO re timed per family request based on home schedule.  Pharmacy notified to change medication schedule.

## 2013-09-14 NOTE — Progress Notes (Addendum)
[facility] PROGRESS NOTE  Luis Payne FYB:[id number] DOB: May 08, 1941 DOA: 09/13/2013 PCP: Glo Herring., MD  Assessment/Plan:  Acute Hypoxic respiratory failure -back on 100% O2 -secondary to progressive lung malignancy and COPD, unclear if component of PNA  -agree with empiric Abx -continue IV solumedrol, nebs -I'm not sure if thoracentesis will be possible given loculation, unsuccessful in April and Unable to place Chest tube per Dr.Bartle 3/31 -d/w IR who reviewed last Ct and felt the risks of thoracentesis outweighed benefits and hence deferrred this -overall prognosis very poor -Palliative consult requested for Goals of care  H/p PE -on lovenox  Chronic systolic CHF -last ECHo with improved EF to 60% -continue Po lasix  AKI -improved  Widely metastatic Lung CA -appreciate Dr.Mohamed's consult, not candidate for further treatment -recommended Palliative care -pain control-increased MS contin  DVT proph: lovenox  Code Status:DNR Family Communication:  Disposition Plan: pending palliative meeting   Consultants:  ONc  Palliative  Antibiotics:  Vanc  Zosyn  HPI/Subjective: Some dyspnea, and agitation this am  Objective: Filed Vitals:   09/14/13 0700  BP: 134/57  Pulse: 101  Temp:   Resp: 12    Intake/Output Summary (Last 24 hours) at 09/14/13 0726 Last data filed at 09/14/13 0500  Gross per 24 hour  Intake    406 ml  Output   1900 ml  Net  -1494 ml   Filed Weights   09/13/13 1211 09/13/13 1325 09/14/13 0600  Weight: 59.421 kg (131 lb) 57.8 kg (127 lb 6.8 oz) 58.8 kg (129 lb 10.1 oz)    Exam:   General:  AAOxself, place with NRM   Cardiovascular: S1S2/RRR  Respiratory: scattered ronchi  Abdomen: sioft, slightly distended, BS present  Musculoskeletal: trace edema    Data Reviewed: Basic Metabolic Panel:  Recent Labs Lab 09/13/13 0936 09/13/13 0936 09/13/13 1100 09/13/13 1134 09/14/13 0255  NA  --  141 139 137  137  K  --  4.5 4.5 4.6 5.1  CL  --   --  96 94* 92*  CO2  --  33* 37*  --  39*  GLUCOSE  --  147* 121* 135* 132*  BUN  --  35.6* 34* 35* 32*  CREATININE  --  1.2 1.16 1.50* 1.15  CALCIUM  --  10.7* 10.4  --  9.5  MG  --  1.9  --   --   --   PHOS 3.8  --   --   --   --    Liver Function Tests:  Recent Labs Lab 09/13/13 0936  AST 24  ALT 11  ALKPHOS 124  BILITOT 0.22  PROT 5.8*  ALBUMIN 2.2*   No results found for this basename: LIPASE, AMYLASE,  in the last 168 hours No results found for this basename: AMMONIA,  in the last 168 hours CBC:  Recent Labs Lab 09/13/13 0936 09/13/13 1100 09/13/13 1134 09/14/13 0255  WBC 9.5 11.7*  --  9.6  NEUTROABS 8.3* 10.3*  --   --   HGB 10.2* 10.5* 10.9* 8.9*  HCT 30.8* 33.8* 32.0* 28.4*  MCV 99.4* 103.7*  --  101.8*  PLT 360 385  --  316   Cardiac Enzymes: No results found for this basename: CKTOTAL, CKMB, CKMBINDEX, TROPONINI,  in the last 168 hours BNP (last 3 results)  Recent Labs  07/31/13 1120 08/01/13 0411 09/13/13 1100  PROBNP 378.1* 422.0* 1825.0*   CBG: No results found for this basename: GLUCAP,  in the last  168 hours  Recent Results (from the past 240 hour(s))  MRSA PCR SCREENING     Status: None   Collection Time    09/13/13  1:20 PM      Result Value Ref Range Status   MRSA by PCR NEGATIVE  NEGATIVE Final   Comment:            The GeneXpert MRSA Assay (FDA     approved for NASAL specimens     only), is one component of a     comprehensive MRSA colonization     surveillance program. It is not     intended to diagnose MRSA     infection nor to guide or     monitor treatment for     MRSA infections.     Studies: Dg Chest Port 1 View  09/13/2013   CLINICAL DATA:  Shortness of breath.  Left-sided lung cancer.  EXAM: PORTABLE CHEST - 1 VIEW  COMPARISON:  CT scan 08/08/2013 and chest CT 09/08/2013.  FINDINGS: Complete opacification of the left hemi thorax is again demonstrated. Minimal aerated lung in  the left apex. The right lung demonstrates chronic emphysematous changes and pulmonary scarring. Low lung volumes with vascular crowding and basilar atelectasis.  IMPRESSION: Near complete opacification of the left hemi thorax. This appears relatively stable to slightly progressive.  Emphysematous changes in the right lung along with chronic pulmonary scarring. Low lung volumes with vascular crowding atelectasis.   Electronically Signed   By: Kalman Jewels M.D.   On: 09/13/2013 11:53    Scheduled Meds: . budesonide  0.25 mg Nebulization Q12H  . carvedilol  3.125 mg Oral BID WC  . digoxin  0.125 mg Oral Daily  . enoxaparin  1 mg/kg Subcutaneous QHS  . feeding supplement (ENSURE COMPLETE)  237 mL Oral BID BM  . folic acid  1 mg Oral Daily  . furosemide  20 mg Oral Daily  . guaiFENesin  600 mg Oral Daily  . levalbuterol  1.25 mg Nebulization TID PC & HS  . methylPREDNISolone (SOLU-MEDROL) injection  60 mg Intravenous Q6H  . morphine  30 mg Oral Q12H  . piperacillin-tazobactam (ZOSYN)  IV  3.375 g Intravenous 3 times per day  . polyethylene glycol  17 g Oral BID  . senna-docusate  1 tablet Oral Daily  . sodium chloride  3 mL Intravenous Q12H  . vancomycin  750 mg Intravenous Q24H   Continuous Infusions:  Antibiotics Given (last 72 hours)   Date/Time Action Medication Dose Rate   09/13/13 1259 Given   vancomycin (VANCOCIN) IVPB 750 mg/150 ml premix 750 mg 150 mL/hr      Active Problems:   Respiratory failure    Time spent:46min    [facility] Pager [phone]. If 7PM-7AM, please contact night-coverage at [email or web address], password [email or web address] 09/14/2013, 7:26 AM  LOS: 1 day

## 2013-09-14 NOTE — Progress Notes (Signed)
I received a call from Luis Payne's wife stating that he has been admitted and in the ICU. She requested that his upcoming MRI and follow-up for Dr. Tammi Klippel be cancelled. I have called [facility] and cancelled the scan for 5/8 and the follow-up for Dr. Tammi Klippel on 5/11.  Mont Dutton

## 2013-09-15 ENCOUNTER — Other Ambulatory Visit: Payer: Self-pay

## 2013-09-15 DIAGNOSIS — E871 Hypo-osmolality and hyponatremia: Secondary | ICD-10-CM

## 2013-09-15 DIAGNOSIS — Z515 Encounter for palliative care: Secondary | ICD-10-CM

## 2013-09-15 MED ORDER — ALPRAZOLAM 0.5 MG PO TABS
0.5000 mg | ORAL_TABLET | Freq: Every day | ORAL | Status: DC
  Administered 2013-09-15: 0.5 mg via ORAL
  Filled 2013-09-15 (×2): qty 1

## 2013-09-15 MED ORDER — IPRATROPIUM BROMIDE 0.02 % IN SOLN
0.5000 mg | RESPIRATORY_TRACT | Status: DC
  Administered 2013-09-15 – 2013-09-16 (×8): 0.5 mg via RESPIRATORY_TRACT
  Filled 2013-09-15 (×9): qty 2.5

## 2013-09-15 MED ORDER — LEVALBUTEROL HCL 1.25 MG/0.5ML IN NEBU
1.2500 mg | INHALATION_SOLUTION | RESPIRATORY_TRACT | Status: DC
  Administered 2013-09-15 – 2013-09-16 (×6): 1.25 mg via RESPIRATORY_TRACT
  Filled 2013-09-15 (×11): qty 0.5

## 2013-09-15 NOTE — Progress Notes (Signed)
Palliative Care Team at [place] Note   SUBJECTIVE: Sitting up in bed playing a game on his Ipad. Says he "needs another breathing treatment". Per wife they tried oximizer but he dropped his sats-wife didn't think it was placed properly and was concerned the switch was made while he was eating- she wants another try. He is requiring high doses of O2 and NRB to keep his sats up- otherwise he feels ok.  Interval Events: Comfort care transition 5/6  OBJECTIVE: Vital Signs: BP 127/72  Pulse 86  Temp(Src) 97.5 F (36.4 C) (Oral)  Resp 28  Ht 5\' 5"  (1.651 m)  Wt 58.8 kg (129 lb 10.1 oz)  BMI 21.57 kg/m2  SpO2 90%   Intake and Output: 05/06 0701 - 05/07 0700 In: 470 [I.V.:170; IV Piggyback:300] Out: 900 [Urine:900]  Physical Exam: Comfortable appearing=- mild distress with movement or talking- but he is content.  Breath sounds absent on left-shallow   Allergies  Allergen Reactions  . Lorazepam     "made me disoriented and hallucinated"    Medications: Scheduled Meds:  . budesonide  0.25 mg Nebulization Q12H  . carvedilol  3.125 mg Oral BID WC  . digoxin  0.125 mg Oral Daily  . enoxaparin  1 mg/kg Subcutaneous QHS  . folic acid  1 mg Oral Daily  . furosemide  20 mg Oral Daily  . guaiFENesin  600 mg Oral Daily  . ipratropium  0.5 mg Nebulization Q4H  . lactose free nutrition  237 mL Oral TID BM  . levalbuterol  1.25 mg Nebulization 6 times per day  . methylPREDNISolone (SOLU-MEDROL) injection  60 mg Intravenous Q6H  . morphine  30 mg Oral Q12H  . piperacillin-tazobactam (ZOSYN)  IV  3.375 g Intravenous 3 times per day  . polyethylene glycol  17 g Oral BID  . senna-docusate  2 tablet Oral Daily  . sodium chloride  3 mL Intravenous Q12H    Continuous Infusions:    PRN Meds: sodium chloride, ALPRAZolam, levalbuterol, morphine CONCENTRATE, ondansetron (ZOFRAN) IV, ondansetron, sodium chloride  Stool Softner: YES  Palliative Performance Scale:  20%    Labs: CBC    Component Value Date/Time   WBC 9.6 09/14/2013 0255   WBC 9.5 09/13/2013 0936   RBC 2.79* 09/14/2013 0255   RBC 3.10* 09/13/2013 0936   HGB 8.9* 09/14/2013 0255   HGB 10.2* 09/13/2013 0936   HCT 28.4* 09/14/2013 0255   HCT 30.8* 09/13/2013 0936   PLT 316 09/14/2013 0255   PLT 360 09/13/2013 0936   MCV 101.8* 09/14/2013 0255   MCV 99.4* 09/13/2013 0936   MCH 31.9 09/14/2013 0255   MCH 33.0 09/13/2013 0936   MCHC 31.3 09/14/2013 0255   MCHC 33.2 09/13/2013 0936   RDW 15.6* 09/14/2013 0255   RDW 16.2* 09/13/2013 0936   LYMPHSABS 0.7 09/13/2013 1100   LYMPHSABS 0.4* 09/13/2013 0936   MONOABS 0.7 09/13/2013 1100   MONOABS 0.8 09/13/2013 0936   EOSABS 0.0 09/13/2013 1100   EOSABS 0.0 09/13/2013 0936   BASOSABS 0.0 09/13/2013 1100   BASOSABS 0.0 09/13/2013 0936    CMET     Component Value Date/Time   NA 137 09/14/2013 0255   NA 141 09/13/2013 0936   K 5.1 09/14/2013 0255   K 4.5 09/13/2013 0936   CL 92* 09/14/2013 0255   CL 94* 11/01/2012 1105   CO2 39* 09/14/2013 0255   CO2 33* 09/13/2013 0936   GLUCOSE 132* 09/14/2013 0255   GLUCOSE 147*  09/13/2013 0936   GLUCOSE 124* 11/01/2012 1105   BUN 32* 09/14/2013 0255   BUN 35.6* 09/13/2013 0936   CREATININE 1.15 09/14/2013 0255   CREATININE 1.2 09/13/2013 0936   CALCIUM 9.5 09/14/2013 0255   CALCIUM 10.7* 09/13/2013 0936   PROT 5.8* 09/13/2013 0936   PROT 6.4 08/09/2013 0741   ALBUMIN 2.2* 09/13/2013 0936   ALBUMIN 2.7* 08/09/2013 0741   AST 24 09/13/2013 0936   AST 29 08/09/2013 0741   ALT 11 09/13/2013 0936   ALT 37 08/09/2013 0741   ALKPHOS 124 09/13/2013 0936   ALKPHOS 107 08/09/2013 0741   BILITOT 0.22 09/13/2013 0936   BILITOT 0.4 08/09/2013 0741   GFRNONAA 61* 09/14/2013 0255   GFRAA 71* 09/14/2013 0255    ASSESSMENT/ PLAN: 73 yo man with advanced lung cancer, no longer a candidate for chemotherapy and has been under the care of Dr. Earlie Server. He is severely dyspneic and has large persistent left effusion not amenable to thoracentesis or VATS. He has a very high O2 requirement and has  been on NRB-tolerating fairly well. He is still eating but it is a challenge with the mask.   Agreeable to home with hospice when medically ready- need to try to get him on a reasonable home O2 regimen-an oximizer would help- would appreciate respiratory care's assistance with this.  Changed ghis nebs to q4 while awake- he thinks those are most helpful.  Continue current morphine doses  Continue Alprazolam he did well with dose last PM- will schedule dose for tonight- he looks better in general today.  Will continue to follow for d/c needs and symptoms       Time In: 2:45PM Time Out: 3:20PM Total Time Spent with Patient: 35 minutes     Greater than 50%  of this time was spent counseling and coordinating care related to the above assessment and plan.   Acquanetta Chain, DO  09/15/2013, 5:55 PM  Please contact Palliative Medicine Team phone at [phone] for questions and concerns.

## 2013-09-15 NOTE — Progress Notes (Signed)
Full report given to [facility] Pt transported to 1333 via bed with 6L [place] and 100% nonrebreather.

## 2013-09-15 NOTE — Progress Notes (Addendum)
[facility] PROGRESS NOTE  Luis Payne MBW:[id number] DOB: 02/13/1941 DOA: 09/13/2013 PCP: Glo Herring., MD  Assessment/Plan:  Acute Hypoxic respiratory failure -back on 100% O2 -secondary to progressive lung malignancy and COPD, loculated effusion unclear if component of PNA  -On Vanc/Zosyn, cultures negative, will stop Vanc and continue ZOsyn -continue IV solumedrol, nebs -thoracentesis not possible per IR given loculation, unsuccessful in April and Unable to place Chest tube per Dr.Bartle 3/31 -d/w IR who reviewed last Ct and felt the risks of thoracentesis outweighed benefits and hence deferrred this -overall prognosis very poor -Palliative consult requested for Goals of care, s/p family meeting now Comfort care -will transfer to med-surg, continue Abx/lasix for now  H/p PE -on lovenox  Chronic systolic CHF -last ECHo with improved EF to 60% -continue Po lasix  AKI -improved  Widely metastatic Lung CA -appreciate Dr.Mohamed's consult, not candidate for further treatment -Palliative care following, now comfort care -pain controlled on MS Contin and roxanol  DVT proph: lovenox  Code Status:DNR Family Communication: d/w wife at bedside Disposition Plan: [place] to floor  Consultants:  ONc  Palliative  Antibiotics:  Vanc  Zosyn  HPI/Subjective: Feels ok, breathing so-so, slept some  Objective: Filed Vitals:   09/15/13 1133  BP: 146/73  Pulse: 102  Temp: 97.3 F (36.3 C)  Resp: 26    Intake/Output Summary (Last 24 hours) at 09/15/13 1303 Last data filed at 09/15/13 0745  Gross per 24 hour  Intake    320 ml  Output   1575 ml  Net  -1255 ml   Filed Weights   09/13/13 1211 09/13/13 1325 09/14/13 0600  Weight: 59.421 kg (131 lb) 57.8 kg (127 lb 6.8 oz) 58.8 kg (129 lb 10.1 oz)    Exam:   General:  AAOxself, place with NRM and 6L [place]  Cardiovascular: S1S2/RRR  Respiratory: scattered ronchi  Abdomen: sioft, slightly distended, BS  present  Musculoskeletal: trace edema    Data Reviewed: Basic Metabolic Panel:  Recent Labs Lab 09/13/13 0936 09/13/13 0936 09/13/13 1100 09/13/13 1134 09/14/13 0255  NA  --  141 139 137 137  K  --  4.5 4.5 4.6 5.1  CL  --   --  96 94* 92*  CO2  --  33* 37*  --  39*  GLUCOSE  --  147* 121* 135* 132*  BUN  --  35.6* 34* 35* 32*  CREATININE  --  1.2 1.16 1.50* 1.15  CALCIUM  --  10.7* 10.4  --  9.5  MG  --  1.9  --   --   --   PHOS 3.8  --   --   --   --    Liver Function Tests:  Recent Labs Lab 09/13/13 0936  AST 24  ALT 11  ALKPHOS 124  BILITOT 0.22  PROT 5.8*  ALBUMIN 2.2*   No results found for this basename: LIPASE, AMYLASE,  in the last 168 hours No results found for this basename: AMMONIA,  in the last 168 hours CBC:  Recent Labs Lab 09/13/13 0936 09/13/13 1100 09/13/13 1134 09/14/13 0255  WBC 9.5 11.7*  --  9.6  NEUTROABS 8.3* 10.3*  --   --   HGB 10.2* 10.5* 10.9* 8.9*  HCT 30.8* 33.8* 32.0* 28.4*  MCV 99.4* 103.7*  --  101.8*  PLT 360 385  --  316   Cardiac Enzymes: No results found for this basename: CKTOTAL, CKMB, CKMBINDEX, TROPONINI,  in the last 168 hours BNP (  last 3 results)  Recent Labs  07/31/13 1120 08/01/13 0411 09/13/13 1100  PROBNP 378.1* 422.0* 1825.0*   CBG: No results found for this basename: GLUCAP,  in the last 168 hours  Recent Results (from the past 240 hour(s))  CULTURE, BLOOD (ROUTINE X 2)     Status: None   Collection Time    09/13/13 10:55 AM      Result Value Ref Range Status   Specimen Description BLOOD RIGHT FOREARM   Final   Special Requests BOTTLES DRAWN AEROBIC AND ANAEROBIC 1.5CC EACH   Final   Culture  Setup Time     Final   Value: 09/13/2013 14:25     Performed at [facility]   Culture     Final   Value:        BLOOD CULTURE RECEIVED NO GROWTH TO DATE CULTURE WILL BE HELD FOR 5 DAYS BEFORE ISSUING A FINAL NEGATIVE REPORT     Performed at [facility]   Report Status PENDING    Incomplete  CULTURE, BLOOD (ROUTINE X 2)     Status: None   Collection Time    09/13/13 11:00 AM      Result Value Ref Range Status   Specimen Description BLOOD RIGHT ANTECUBITAL   Final   Special Requests BOTTLES DRAWN AEROBIC AND ANAEROBIC [facility] EACH   Final   Culture  Setup Time     Final   Value: 09/13/2013 14:26     Performed at [facility]   Culture     Final   Value:        BLOOD CULTURE RECEIVED NO GROWTH TO DATE CULTURE WILL BE HELD FOR 5 DAYS BEFORE ISSUING A FINAL NEGATIVE REPORT     Performed at [facility]   Report Status PENDING   Incomplete  MRSA PCR SCREENING     Status: None   Collection Time    09/13/13  1:20 PM      Result Value Ref Range Status   MRSA by PCR NEGATIVE  NEGATIVE Final   Comment:            The GeneXpert MRSA Assay (FDA     approved for NASAL specimens     only), is one component of a     comprehensive MRSA colonization     surveillance program. It is not     intended to diagnose MRSA     infection nor to guide or     monitor treatment for     MRSA infections.     Studies: No results found.  Scheduled Meds: . budesonide  0.25 mg Nebulization Q12H  . carvedilol  3.125 mg Oral BID WC  . digoxin  0.125 mg Oral Daily  . enoxaparin  1 mg/kg Subcutaneous QHS  . folic acid  1 mg Oral Daily  . furosemide  20 mg Oral Daily  . guaiFENesin  600 mg Oral Daily  . ipratropium  0.5 mg Nebulization Q6H WA  . lactose free nutrition  237 mL Oral TID BM  . levalbuterol  1.25 mg Nebulization Q6H WA  . methylPREDNISolone (SOLU-MEDROL) injection  60 mg Intravenous Q6H  . morphine  30 mg Oral Q12H  . piperacillin-tazobactam (ZOSYN)  IV  3.375 g Intravenous 3 times per day  . polyethylene glycol  17 g Oral BID  . senna-docusate  2 tablet Oral Daily  . sodium chloride  3 mL Intravenous Q12H  . vancomycin  500 mg Intravenous  Q12H   Continuous Infusions:  Antibiotics Given (last 72 hours)   Date/Time Action Medication Dose Rate   09/13/13  1259 Given   vancomycin (VANCOCIN) IVPB 750 mg/150 ml premix 750 mg 150 mL/hr   09/14/13 1119 Given   vancomycin (VANCOCIN) 500 mg in sodium chloride 0.9 % 100 mL IVPB 500 mg 100 mL/hr   09/14/13 2225 Given   vancomycin (VANCOCIN) 500 mg in sodium chloride 0.9 % 100 mL IVPB 500 mg 100 mL/hr   09/15/13 1245 Given   vancomycin (VANCOCIN) 500 mg in sodium chloride 0.9 % 100 mL IVPB 500 mg 100 mL/hr      Active Problems:   Respiratory failure   Protein-calorie malnutrition, severe    Time spent:29min    [facility] Pager [phone]. If 7PM-7AM, please contact night-coverage at [email or web address], password [facility] 09/15/2013, 1:03 PM  LOS: 2 days

## 2013-09-16 ENCOUNTER — Telehealth: Payer: Self-pay | Admitting: *Deleted

## 2013-09-16 ENCOUNTER — Ambulatory Visit: Payer: Self-pay | Admitting: Nurse Practitioner

## 2013-09-16 DIAGNOSIS — J189 Pneumonia, unspecified organism: Secondary | ICD-10-CM

## 2013-09-16 DIAGNOSIS — J441 Chronic obstructive pulmonary disease with (acute) exacerbation: Secondary | ICD-10-CM

## 2013-09-16 DIAGNOSIS — R0989 Other specified symptoms and signs involving the circulatory and respiratory systems: Secondary | ICD-10-CM

## 2013-09-16 DIAGNOSIS — R0609 Other forms of dyspnea: Secondary | ICD-10-CM

## 2013-09-16 LAB — EXPECTORATED SPUTUM ASSESSMENT W REFEX TO RESP CULTURE

## 2013-09-16 LAB — EXPECTORATED SPUTUM ASSESSMENT W GRAM STAIN, RFLX TO RESP C

## 2013-09-16 MED ORDER — LEVALBUTEROL HCL 0.63 MG/3ML IN NEBU
0.6300 mg | INHALATION_SOLUTION | RESPIRATORY_TRACT | Status: DC
  Administered 2013-09-16 (×2): 0.63 mg via RESPIRATORY_TRACT
  Filled 2013-09-16 (×4): qty 3

## 2013-09-16 MED ORDER — ALPRAZOLAM 0.5 MG PO TABS: 0.5000 mg | ORAL_TABLET | ORAL | Status: AC | PRN

## 2013-09-16 MED ORDER — SENNOSIDES-DOCUSATE SODIUM 8.6-50 MG PO TABS
2.0000 | ORAL_TABLET | Freq: Two times a day (BID) | ORAL | Status: DC
  Administered 2013-09-16: 2 via ORAL

## 2013-09-16 MED ORDER — SENNOSIDES-DOCUSATE SODIUM 8.6-50 MG PO TABS: 2.0000 | ORAL_TABLET | Freq: Two times a day (BID) | ORAL | Status: AC

## 2013-09-16 MED ORDER — ENOXAPARIN SODIUM 60 MG/0.6ML ~~LOC~~ SOLN
1.0000 mg/kg | SUBCUTANEOUS | Status: DC
  Administered 2013-09-16: 60 mg via SUBCUTANEOUS
  Filled 2013-09-16: qty 0.6

## 2013-09-16 MED ORDER — PREDNISONE 10 MG PO TABS: 10.0000 mg | ORAL_TABLET | Freq: Every day | ORAL | Status: AC

## 2013-09-16 MED ORDER — BISACODYL 10 MG RE SUPP
10.0000 mg | Freq: Once | RECTAL | Status: AC
  Administered 2013-09-16: 10 mg via RECTAL
  Filled 2013-09-16: qty 1

## 2013-09-16 MED ORDER — MORPHINE SULFATE (CONCENTRATE) 10 MG /0.5 ML PO SOLN: 10.0000 mg | ORAL | Status: AC | PRN

## 2013-09-16 MED ORDER — METHYLPREDNISOLONE SODIUM SUCC 125 MG IJ SOLR
60.0000 mg | Freq: Four times a day (QID) | INTRAMUSCULAR | Status: DC
  Administered 2013-09-16: 60 mg via INTRAVENOUS
  Filled 2013-09-16 (×3): qty 0.96

## 2013-09-16 MED ORDER — PREDNISONE 50 MG PO TABS
50.0000 mg | ORAL_TABLET | Freq: Every day | ORAL | Status: DC
  Filled 2013-09-16: qty 1

## 2013-09-16 MED ORDER — LEVOFLOXACIN 500 MG PO TABS: 500.0000 mg | ORAL_TABLET | Freq: Every day | ORAL | Status: AC

## 2013-09-16 NOTE — Progress Notes (Signed)
Met with pt and spouse to offer home hospice choice. Pt did not participate as he was asleep. Pt's wife chose [facility] to provide the services. I made a referral to them and spoke with [facility]. I faxed over H&P, demographics sheet and progress notes. At this time, pt's wife states they have all the equipment they need.   Allene Dillon RN BSN   [phone]

## 2013-09-16 NOTE — Progress Notes (Signed)
D/c cont pulse ox per nursing order.  Irven Baltimore, RN

## 2013-09-16 NOTE — Progress Notes (Signed)
Clinical Social Work Department BRIEF PSYCHOSOCIAL ASSESSMENT 09/16/2013  Patient:  Luis Payne, Luis Payne     Account Number:  [id number]     Admit date:  09/13/2013  Clinical Social Worker:  Earlie Server  Date/Time:  09/16/2013 04:00 PM  Referred by:  Care Management  Date Referred:  09/16/2013 Referred for  Transportation assistance   Other Referral:   Interview type:  Patient Other interview type:    PSYCHOSOCIAL DATA Living Status:  FAMILY Admitted from facility:   Level of care:   Primary support name:  Rod Holler Primary support relationship to patient:  SPOUSE Degree of support available:   Strong    CURRENT CONCERNS Current Concerns  Other - See comment   Other Concerns:   Transportation    SOCIAL WORK ASSESSMENT / PLAN CSW received referral from CM in order to assist with transportation home. Patient plans to DC home with hospice and family.    CSW reviewed chart and met with patient and family at bedside. CSW introduced myself and explained role. Patient reports that he is happy to be returning home and wife has been working with staff to get affairs in order. CSW verified home address and explained ambulance process.    CSW prepared DC packet with PTAR forms, DNR, and hard scripts included. RN to call [phone] once equipment is ordered and family ready to accept patient.    CSW is signing off but available if further needs arise.   Assessment/plan status:  No Further Intervention Required Other assessment/ plan:   Information/referral to community resources:   PTAR    PATIENT'S/FAMILY'S RESPONSE TO PLAN OF CARE: Patient and family agreeable to PTAR to transport home. Patient aware of no guarantee of payment and wife understanding of plans. Patient happy to be returning home and family engaged and helpful. Patient thanked CSW for assistance.       [place], [place] [phone]

## 2013-09-16 NOTE — Discharge Summary (Signed)
Physician Discharge Summary  Luis Payne VZC:[id number] DOB: 1940-07-28 DOA: 09/13/2013  PCP: Glo Herring., MD  Admit date: 09/13/2013 Discharge date: 09/16/2013  Time spent: 45 minutes  Recommendations for Outpatient Follow-up:  Home with Hospice for End of life care  Discharge Diagnoses:  Active Problems:   Malignant neoplasm of upper lobe, bronchus or lung   Brain metastases   AKI (acute kidney injury)   Acute respiratory failure with hypoxia   Chronic systolic congestive heart failure   Anemia, chronic disease   COPD exacerbation   Pleural effusion   Collapse of left lung   Pain, cancer   Dyspnea   Respiratory failure   Protein-calorie malnutrition, severe   Discharge Condition: poor  Diet recommendation:comfort feeds  Filed Weights   09/13/13 1211 09/13/13 1325 09/14/13 0600  Weight: 59.421 kg (131 lb) 57.8 kg (127 lb 6.8 oz) 58.8 kg (129 lb 10.1 oz)    History of present illness:  73 y.o. M with history of lung cancer, last chemotherapy 2 weeks ago, COPD on 5 L of oxygen at home, systolic heart failure, pulmonary embolus on Lovenox, who presented to [facility] ED with main concern of several days of progressively worsening shortness of breath associated with productive cough of yellow sputum, subjective fevers, chills. He was at the cancer center today for chemotherapy treatment and was found to be hypoxic int he office with oxygen saturation in 30's.  In ED, pt in respiratory distress with oxygen via [place] , saturating in high 80   Hospital Course:  Acute Hypoxic respiratory failure  -secondary to progressive lung malignancy and COPD, loculated effusion unclear if component of PNA  -On Vanc/Zosyn, cultures negative, stopped Vanc 5/7 and continue ZOsyn, change to levaquin at discharge for 4 more days -on IV solumedrol, nebs, no active wheezing, will cut this down and transition to Prednisone for a taper -thoracentesis not possible per IR given loculation, unsuccessful in  April and Unable to place Chest tube per Dr.Bartle 3/31  -d/w IR who reviewed last Ct and felt the risks of thoracentesis outweighed benefits and hence deferrred this  -overall prognosis very poor  -Palliative consult requested for Goals of care, s/p family meeting, now Comfort care  -continue Abx/lasix for now  -Hospice and CM to arrange high concentration o2 at home, currently on NRM and 6L via [place]  -plan for home with Hospice, appreciate Dr.Goldings help  -Will be followed by Hospice at home for end of life care  H/p PE  -on lovenox   Chronic systolic CHF  -last ECHo with improved EF to 60%  -continue Po lasix   AKI  -improved   Widely metastatic Lung CA  -appreciate Dr.Mohamed's consult, not candidate for further treatment  -Palliative care following, now comfort care  -pain controlled on MS Contin and roxanol   Constipation  continue miralax BID, increase senokot, add dulcolax   Code Status:DNR  Disposition Plan: home with hospice   Consultations:  Onc  Palliative   Discharge Exam: Filed Vitals:   09/16/13 1325  BP: 127/78  Pulse: 97  Temp: 97.2 F (36.2 C)  Resp: 19    General: AAO to self only, lethargic Cardiovascular: S1S2/RRR Respiratory: scattered ronchi  Discharge Instructions You were cared for by a hospitalist during your hospital stay. If you have any questions about your discharge medications or the care you received while you were in the hospital after you are discharged, you can call the unit and asked to speak with the hospitalist on  call if the hospitalist that took care of you is not available. Once you are discharged, your primary care physician will handle any further medical issues. Please note that NO REFILLS for any discharge medications will be authorized once you are discharged, as it is imperative that you return to your primary care physician (or establish a relationship with a primary care physician if you do not have one) for your  aftercare needs so that they can reassess your need for medications and monitor your lab values.      Discharge Orders   Future Orders Complete By Expires   Discharge instructions  As directed    Increase activity slowly  As directed        Medication List    STOP taking these medications       oxyCODONE 5 MG immediate release tablet  Commonly known as:  Oxy IR/ROXICODONE     PRESCRIPTION MEDICATION      TAKE these medications       ALPRAZolam 0.5 MG tablet  Commonly known as:  XANAX  Take 1 tablet (0.5 mg total) by mouth every 4 (four) hours as needed for anxiety or sleep.     budesonide 0.25 MG/2ML nebulizer solution  Commonly known as:  PULMICORT  Take 0.25 mg by nebulization every 12 (twelve) hours.     carvedilol 3.125 MG tablet  Commonly known as:  COREG  Take 3.125 mg by mouth 2 (two) times daily.     digoxin 0.125 MG tablet  Commonly known as:  LANOXIN  Take 0.125 mg by mouth daily.     enoxaparin 60 MG/0.6ML injection  Commonly known as:  LOVENOX  Inject 1 mg/kg into the skin daily.     feeding supplement (ENSURE COMPLETE) Liqd  Take 237 mLs by mouth 2 (two) times daily between meals.     folic acid 1 MG tablet  Commonly known as:  FOLVITE  Take 1 mg by mouth daily.     furosemide 20 MG tablet  Commonly known as:  LASIX  Take 20 mg by mouth daily.     guaiFENesin 600 MG 12 hr tablet  Commonly known as:  MUCINEX  Take 600 mg by mouth daily.     levalbuterol 0.63 MG/3ML nebulizer solution  Commonly known as:  XOPENEX  Take 0.63 mg by nebulization every 4 (four) hours as needed for wheezing.     levofloxacin 500 MG tablet  Commonly known as:  LEVAQUIN  Take 1 tablet (500 mg total) by mouth daily. For 4 days     morphine 30 MG 12 hr tablet  Commonly known as:  MS CONTIN  Take 30 mg by mouth every 12 (twelve) hours.     morphine CONCENTRATE 10 mg / 0.5 ml concentrated solution  Take 0.5-1 mLs (10-20 mg total) by mouth every 2 (two) hours as  needed for moderate pain, severe pain, anxiety or shortness of breath (Dyspnea Resp>25, Sleep).     OXYGEN-HELIUM IN  Inhale 4 L into the lungs.     polyethylene glycol packet  Commonly known as:  MIRALAX / GLYCOLAX  Take 17 g by mouth 2 (two) times daily.     predniSONE 10 MG tablet  Commonly known as:  DELTASONE  Take 1-4 tablets (10-40 mg total) by mouth daily with breakfast. Take 40mg  for 2 days then 30mg  for 2 days then 20mg  for 2 days then back to 10mg  daily     senna-docusate 8.6-50 MG per tablet  Commonly known as:  Senokot-S  Take 2 tablets by mouth 2 (two) times daily.       Allergies  Allergen Reactions  . Lorazepam     "made me disoriented and hallucinated"      The results of significant diagnostics from this hospitalization (including imaging, microbiology, ancillary and laboratory) are listed below for reference.    Significant Diagnostic Studies: Ct Chest W Contrast  09/08/2013   CLINICAL DATA:  Metastatic lung cancer.  Restaging. RECIST 1.1  EXAM: CT CHEST, ABDOMEN, AND PELVIS WITH CONTRAST  TECHNIQUE: Multidetector CT imaging of the chest, abdomen and pelvis was performed following the standard protocol during bolus administration of intravenous contrast.  CONTRAST:  17mL OMNIPAQUE IOHEXOL 300 MG/ML  SOLN  COMPARISON:  07/14/2013.  RECIST 1.1  Target Lesions:  1.  Mass like opacity in the anterior left lung apex measuring 38 mm on image 11 of series 2, previously 34 mm 2. Left anterior pleural metastatic disease measuring 22 mm cm on image 45 of series 2, previously 29 mm. 3. Left abdominal paraaortic retroperitoneal lymph node measuring 21 cm on image 60 of series 2, previously 1.4 cm.  Non-target Lesions:  1. 10 mm right middle lobe pulmonary nodule on image 36 of series 4, previously 9 mm.  FINDINGS: CT CHEST FINDINGS  Extensive tumor involvement involving the left hemi thorax. There is progressive worsening of aeration to the entire left lung. A small amount of  aerated lung remaining in the left apex. Large, loculated left pleural effusion with extensive trans pleural spread of tumor in the left hemi thorax is identified. There is evidence of chest wall invasion, which appears progressive when compared with the previous exam. Index lesion within the left posterior hemi thorax measures 1.4 x 2.4 cm, image 42/series 2. Previously 1.3 x 2.1 cm. Similar appearance of pleural tumor extending along the left heart border. Pericardial invasion cannot be excluded.  Advanced changes of an bolus emphysema identified within the right lung. Right middle lobe pulmonary nodule is identified measuring 1 cm, image 36/series 4. Previously 0.9 cm. 7 mm nodule in the superior segment of right lower lobe is identified and appears new from the previous exam.  The heart size is normal. A small amount of pericardial fluid is identified overlying the left ventricular apex, image 37/series 2. This is similar to previous exam. Left cardio phrenic angle tumor is again noted. Soft tissue nodule measures 2.5 x 1.6 cm, image 50/series 2. Previously this measured 2.3 x 1.5 cm. 1 cm sub- carinal lymph node is stable, image 26/series 2. Right paratracheal lymph node measures 7 mm and is unchanged from previous exam, image 23/series 2.  There is a right supraclavicular lymph node measuring 1 cm, image 5/series 2. This is compared with 9 mm previously. No axillary adenopathy identified.  Review of the visualized bony structures shows no aggressive lytic or sclerotic bone lesions.  CT ABDOMEN AND PELVIS FINDINGS  New abdominal ascites noted. Mild diffuse low attenuation throughout the liver is identified compatible with hepatic steatosis. The gallbladder is normal. No biliary dilatation. Normal appearance of the pancreas. Progressive irregular and nodular thickening of the left hemidiaphragm is identified. Evidence of infradiaphragmatic diaphragmatic spread of tumor into the left hemi thorax is identified.  There is abnormal soft tissue thickening along the undersurface of the left hemidiaphragm and extending around the spleen.  The adrenal glands remain normal. Nonobstructing bilateral renal calculi are again identified. The urinary bladder appears normal.  Calcified atherosclerotic disease  involves the abdominal aorta and its branches. Progressive upper abdominal adenopathy is noted. Gastrohepatic ligament lymph node measures 1.3 cm, image 53/ series 2. Previously 1 cm. Left periaortic lymph node measures 2.1 cm, image 60/series 2. Previously 1.4 cm. No pelvic or inguinal adenopathy identified. There is a mild to moderate amount of ascites within the abdomen and pelvis which is new from previous exam. Mild infiltration of the omental fat is also noted, suspicious for peritoneal disease.  Mild wall thickening along the ventral aspect of the stomach is noted in may reflect direct extension of tumor from the left hemidiaphragm. There is a mild increase in caliber of the small bowel loops which now measure up to 2.7 cm. There is a moderate stool burden identified throughout the colon. No evidence for colonic obstruction.  Review of the bony structures shows no aggressive lytic or sclerotic bone lesions.  IMPRESSION: CT chest:  1. Overall progression of tumor within the chest. There has been worsening aeration to the left lung with progression of left hemi thorax pleural and chest wall involvement. 2. Pulmonary nodules within the right lung are slightly increased in size from previous exam. CT abdomen and pelvis:  1. Suspect trans diaphragmatic spread of tumor into the left upper quadrant of the abdomen.  * There is a new abnormal soft tissue which is encasing the spleen. Infiltration of the omentum is noted and there is abdominal ascites. Findings are worrisome for peritoneal spread of tumor. *Progression of upper abdominal adenopathy.   Electronically Signed   By: Kerby Moors M.D.   On: 09/08/2013 12:01   Ct  Abdomen Pelvis W Contrast  09/08/2013   CLINICAL DATA:  Metastatic lung cancer.  Restaging. RECIST 1.1  EXAM: CT CHEST, ABDOMEN, AND PELVIS WITH CONTRAST  TECHNIQUE: Multidetector CT imaging of the chest, abdomen and pelvis was performed following the standard protocol during bolus administration of intravenous contrast.  CONTRAST:  149mL OMNIPAQUE IOHEXOL 300 MG/ML  SOLN  COMPARISON:  07/14/2013.  RECIST 1.1  Target Lesions:  1.  Mass like opacity in the anterior left lung apex measuring 38 mm on image 11 of series 2, previously 34 mm 2. Left anterior pleural metastatic disease measuring 22 mm cm on image 45 of series 2, previously 29 mm. 3. Left abdominal paraaortic retroperitoneal lymph node measuring 21 cm on image 60 of series 2, previously 1.4 cm.  Non-target Lesions:  1. 10 mm right middle lobe pulmonary nodule on image 36 of series 4, previously 9 mm.  FINDINGS: CT CHEST FINDINGS  Extensive tumor involvement involving the left hemi thorax. There is progressive worsening of aeration to the entire left lung. A small amount of aerated lung remaining in the left apex. Large, loculated left pleural effusion with extensive trans pleural spread of tumor in the left hemi thorax is identified. There is evidence of chest wall invasion, which appears progressive when compared with the previous exam. Index lesion within the left posterior hemi thorax measures 1.4 x 2.4 cm, image 42/series 2. Previously 1.3 x 2.1 cm. Similar appearance of pleural tumor extending along the left heart border. Pericardial invasion cannot be excluded.  Advanced changes of an bolus emphysema identified within the right lung. Right middle lobe pulmonary nodule is identified measuring 1 cm, image 36/series 4. Previously 0.9 cm. 7 mm nodule in the superior segment of right lower lobe is identified and appears new from the previous exam.  The heart size is normal. A small amount of pericardial fluid is identified  overlying the left ventricular  apex, image 37/series 2. This is similar to previous exam. Left cardio phrenic angle tumor is again noted. Soft tissue nodule measures 2.5 x 1.6 cm, image 50/series 2. Previously this measured 2.3 x 1.5 cm. 1 cm sub- carinal lymph node is stable, image 26/series 2. Right paratracheal lymph node measures 7 mm and is unchanged from previous exam, image 23/series 2.  There is a right supraclavicular lymph node measuring 1 cm, image 5/series 2. This is compared with 9 mm previously. No axillary adenopathy identified.  Review of the visualized bony structures shows no aggressive lytic or sclerotic bone lesions.  CT ABDOMEN AND PELVIS FINDINGS  New abdominal ascites noted. Mild diffuse low attenuation throughout the liver is identified compatible with hepatic steatosis. The gallbladder is normal. No biliary dilatation. Normal appearance of the pancreas. Progressive irregular and nodular thickening of the left hemidiaphragm is identified. Evidence of infradiaphragmatic diaphragmatic spread of tumor into the left hemi thorax is identified. There is abnormal soft tissue thickening along the undersurface of the left hemidiaphragm and extending around the spleen.  The adrenal glands remain normal. Nonobstructing bilateral renal calculi are again identified. The urinary bladder appears normal.  Calcified atherosclerotic disease involves the abdominal aorta and its branches. Progressive upper abdominal adenopathy is noted. Gastrohepatic ligament lymph node measures 1.3 cm, image 53/ series 2. Previously 1 cm. Left periaortic lymph node measures 2.1 cm, image 60/series 2. Previously 1.4 cm. No pelvic or inguinal adenopathy identified. There is a mild to moderate amount of ascites within the abdomen and pelvis which is new from previous exam. Mild infiltration of the omental fat is also noted, suspicious for peritoneal disease.  Mild wall thickening along the ventral aspect of the stomach is noted in may reflect direct extension  of tumor from the left hemidiaphragm. There is a mild increase in caliber of the small bowel loops which now measure up to 2.7 cm. There is a moderate stool burden identified throughout the colon. No evidence for colonic obstruction.  Review of the bony structures shows no aggressive lytic or sclerotic bone lesions.  IMPRESSION: CT chest:  1. Overall progression of tumor within the chest. There has been worsening aeration to the left lung with progression of left hemi thorax pleural and chest wall involvement. 2. Pulmonary nodules within the right lung are slightly increased in size from previous exam. CT abdomen and pelvis:  1. Suspect trans diaphragmatic spread of tumor into the left upper quadrant of the abdomen.  * There is a new abnormal soft tissue which is encasing the spleen. Infiltration of the omentum is noted and there is abdominal ascites. Findings are worrisome for peritoneal spread of tumor. *Progression of upper abdominal adenopathy.   Electronically Signed   By: Kerby Moors M.D.   On: 09/08/2013 12:01   Dg Chest Port 1 View  09/13/2013   CLINICAL DATA:  Shortness of breath.  Left-sided lung cancer.  EXAM: PORTABLE CHEST - 1 VIEW  COMPARISON:  CT scan 08/08/2013 and chest CT 09/08/2013.  FINDINGS: Complete opacification of the left hemi thorax is again demonstrated. Minimal aerated lung in the left apex. The right lung demonstrates chronic emphysematous changes and pulmonary scarring. Low lung volumes with vascular crowding and basilar atelectasis.  IMPRESSION: Near complete opacification of the left hemi thorax. This appears relatively stable to slightly progressive.  Emphysematous changes in the right lung along with chronic pulmonary scarring. Low lung volumes with vascular crowding atelectasis.   Electronically Signed  By: Kalman Jewels M.D.   On: 09/13/2013 11:53    Microbiology: Recent Results (from the past 240 hour(s))  CULTURE, BLOOD (ROUTINE X 2)     Status: None   Collection  Time    09/13/13 10:55 AM      Result Value Ref Range Status   Specimen Description BLOOD RIGHT FOREARM   Final   Special Requests BOTTLES DRAWN AEROBIC AND ANAEROBIC 1.5CC EACH   Final   Culture  Setup Time     Final   Value: 09/13/2013 14:25     Performed at [facility]   Culture     Final   Value:        BLOOD CULTURE RECEIVED NO GROWTH TO DATE CULTURE WILL BE HELD FOR 5 DAYS BEFORE ISSUING A FINAL NEGATIVE REPORT     Performed at [facility]   Report Status PENDING   Incomplete  CULTURE, BLOOD (ROUTINE X 2)     Status: None   Collection Time    09/13/13 11:00 AM      Result Value Ref Range Status   Specimen Description BLOOD RIGHT ANTECUBITAL   Final   Special Requests BOTTLES DRAWN AEROBIC AND ANAEROBIC [facility] EACH   Final   Culture  Setup Time     Final   Value: 09/13/2013 14:26     Performed at [facility]   Culture     Final   Value:        BLOOD CULTURE RECEIVED NO GROWTH TO DATE CULTURE WILL BE HELD FOR 5 DAYS BEFORE ISSUING A FINAL NEGATIVE REPORT     Performed at [facility]   Report Status PENDING   Incomplete  MRSA PCR SCREENING     Status: None   Collection Time    09/13/13  1:20 PM      Result Value Ref Range Status   MRSA by PCR NEGATIVE  NEGATIVE Final   Comment:            The GeneXpert MRSA Assay (FDA     approved for NASAL specimens     only), is one component of a     comprehensive MRSA colonization     surveillance program. It is not     intended to diagnose MRSA     infection nor to guide or     monitor treatment for     MRSA infections.     Labs: Basic Metabolic Panel:  Recent Labs Lab 09/13/13 0936 09/13/13 0936 09/13/13 1100 09/13/13 1134 09/14/13 0255  NA  --  141 139 137 137  K  --  4.5 4.5 4.6 5.1  CL  --   --  96 94* 92*  CO2  --  33* 37*  --  39*  GLUCOSE  --  147* 121* 135* 132*  BUN  --  35.6* 34* 35* 32*  CREATININE  --  1.2 1.16 1.50* 1.15  CALCIUM  --  10.7* 10.4  --  9.5  MG  --  1.9  --    --   --   PHOS 3.8  --   --   --   --    Liver Function Tests:  Recent Labs Lab 09/13/13 0936  AST 24  ALT 11  ALKPHOS 124  BILITOT 0.22  PROT 5.8*  ALBUMIN 2.2*   No results found for this basename: LIPASE, AMYLASE,  in the last 168 hours No results found for this basename: AMMONIA,  in the last 168 hours CBC:  Recent Labs Lab 09/13/13 0936 09/13/13 1100 09/13/13 1134 09/14/13 0255  WBC 9.5 11.7*  --  9.6  NEUTROABS 8.3* 10.3*  --   --   HGB 10.2* 10.5* 10.9* 8.9*  HCT 30.8* 33.8* 32.0* 28.4*  MCV 99.4* 103.7*  --  101.8*  PLT 360 385  --  316   Cardiac Enzymes: No results found for this basename: CKTOTAL, CKMB, CKMBINDEX, TROPONINI,  in the last 168 hours BNP: BNP (last 3 results)  Recent Labs  07/31/13 1120 08/01/13 0411 09/13/13 1100  PROBNP 378.1* 422.0* 1825.0*   CBG: No results found for this basename: GLUCAP,  in the last 168 hours     Signed:  [facility] 09/16/2013, 2:50 PM

## 2013-09-16 NOTE — Progress Notes (Signed)
Notified by [facility] pt/family request services of [facility] once pt's is discharged -[facility] aware and writer will be following up with pt/wife for needs.  Current issues related to Oxygen needs at home and safety of pt traveling home via personal vehicle on high O2 litre flow- pt may need non-emergent transport - per discussion with Dr Hilma Favors  Pt should be on Oxymizer tubing at 15 LNC - however per Respiratory notes in EPIC pt on 21 L/min **O2 liter flow needs to be confirmed before anything can be ordered from [facility]  - discussed with [facility] representative, Lecretia, pt will need 2- 10 L O2 concentrator tanks O2 Pkg and B and Pkg D to switch current hospital bed delivered tonite - AGAIN order from [facility] is not able to be initiated until pt O2 orders are confirmed  Writer will be following up with pt/wife later this afternoon; wife aware, if pt discharged this afternoon, [facility] RN to see pt tomorrow am Please send GOLD DNR form home with pt  Above discussed with [facility] referral Center and Clinical Manager  Thank you Danton Sewer, [facility] Liaison [phone]

## 2013-09-16 NOTE — Progress Notes (Signed)
Spoke with Dr Broadus John about pt's oxygen liter flow. She is fine with him going home on 15 liters NRB and 6 liters [place]. [facility] has been made aware of this and will see if they can accomodate this liter flow.  Allene Dillon RN BSN   [phone]

## 2013-09-16 NOTE — Progress Notes (Signed)
Spoke with pt's wife about transportation home. She wants to drive him home but it would be safer for him to go home via ambulance with the high amount of oxygen he is on. She is fine with ambulance transportation home. CSW made aware.  Allene Dillon RN BSN   [phone]

## 2013-09-16 NOTE — Telephone Encounter (Signed)
Received call from [facility], he is now on hospice and Dr Vista Mink is the attending.  SLJ

## 2013-09-16 NOTE — Progress Notes (Signed)
Follow-upWater quality scientist met with pt and wife at bedside (5:15pm)- wife informed they are waiting for delivery of O2 to the home before the staff RN Shanon Brow can call transport- wife requests O2 only she confirmed no need to switch out hospital bed at this time; notified [facility] and also [place] as this request was after 5 pm; driver aware pt to d/c on O2 @ 16 LNC  Per notes pt on 10 L NRBM and 6LNC with oxymizer tubing; staff RN Shanon Brow aware Oxymizer tubing needs to go home with pt; wife requests pt breathing treatment and assess for need for Xanax as it gets closer to pt discharge Staff Rn Shanon Brow aware. Confirmed with wife [facility] assessment RN to see them at the home in the morning - she has [place] 24/7 contact number [phone] for any questions/concerns; pt to transport home when all arrangements in place this evening; staff RN aware to call for transport per CSW note Danton Sewer, [facility] Liaison [phone]

## 2013-09-16 NOTE — Progress Notes (Signed)
[facility] cannot provide 15 liters via NRM and 6 liters via [place]. They can go up to 10 liters via NRM and 6 liters [place]. Spoke with Dr Broadus John and she is ok with pt receiving oxygen at this rate at home. [facility] made aware. Bedside RN made aware as well.  Allene Dillon RN BSN   [phone]

## 2013-09-16 NOTE — Progress Notes (Signed)
Pt will need 2 oxygen concentrators at home. [facility] can only provide one but wife is willing to private pay for the second one. [facility] can provIde 2 concentrators and pt already has all his other equipment through [facility]. If pt uses [facility] then all his equipment has to be switched over to [employer]. Pt's wife therefore wants to use [facility] and I have confirmed with the intake department that they can go to his address.  I spoke with [facility] and made the referral. She will be by to meet pt and spouse.   Allene Dillon RN BSN   [phone]

## 2013-09-16 NOTE — Progress Notes (Signed)
ANTIBIOTIC CONSULT NOTE - FOLLOW UP  Pharmacy Consult for Zosyn Indication: HCAP  Allergies  Allergen Reactions  . Lorazepam     "made me disoriented and hallucinated"    Patient Measurements: Height: 5\' 5"  (165.1 cm) Weight: 129 lb 10.1 oz (58.8 kg) IBW/kg (Calculated) : 61.5  Vital Signs: Temp: 97.7 F (36.5 C) (05/08 0558) Temp src: Oral (05/08 0558) BP: 153/81 mmHg (05/08 0558) Pulse Rate: 69 (05/08 0558) Intake/Output from previous day: 05/07 0701 - 05/08 0700 In: -  Out: 2225 [Urine:2225]  Labs:  Recent Labs  09/13/13 1100 09/13/13 1134 09/14/13 0255  WBC 11.7*  --  9.6  HGB 10.5* 10.9* 8.9*  PLT 385  --  316  CREATININE 1.16 1.50* 1.15   Estimated Creatinine Clearance: 46.9 ml/min (by C-G formula based on Cr of 1.15).    Microbiology: Recent Results (from the past 720 hour(s))  CULTURE, BLOOD (ROUTINE X 2)     Status: None   Collection Time    09/13/13 10:55 AM      Result Value Ref Range Status   Specimen Description BLOOD RIGHT FOREARM   Final   Special Requests BOTTLES DRAWN AEROBIC AND ANAEROBIC 1.5CC EACH   Final   Culture  Setup Time     Final   Value: 09/13/2013 14:25     Performed at [facility]   Culture     Final   Value:        BLOOD CULTURE RECEIVED NO GROWTH TO DATE CULTURE WILL BE HELD FOR 5 DAYS BEFORE ISSUING A FINAL NEGATIVE REPORT     Performed at [facility]   Report Status PENDING   Incomplete  CULTURE, BLOOD (ROUTINE X 2)     Status: None   Collection Time    09/13/13 11:00 AM      Result Value Ref Range Status   Specimen Description BLOOD RIGHT ANTECUBITAL   Final   Special Requests BOTTLES DRAWN AEROBIC AND ANAEROBIC [facility] EACH   Final   Culture  Setup Time     Final   Value: 09/13/2013 14:26     Performed at [facility]   Culture     Final   Value:        BLOOD CULTURE RECEIVED NO GROWTH TO DATE CULTURE WILL BE HELD FOR 5 DAYS BEFORE ISSUING A FINAL NEGATIVE REPORT     Performed at [facility]   Report Status PENDING   Incomplete  MRSA PCR SCREENING     Status: None   Collection Time    09/13/13  1:20 PM      Result Value Ref Range Status   MRSA by PCR NEGATIVE  NEGATIVE Final   Comment:            The GeneXpert MRSA Assay (FDA     approved for NASAL specimens     only), is one component of a     comprehensive MRSA colonization     surveillance program. It is not     intended to diagnose MRSA     infection nor to guide or     monitor treatment for     MRSA infections.    Anti-infectives   Start     Dose/Rate Route Frequency Ordered Stop   09/14/13 1045  vancomycin (VANCOCIN) 500 mg in sodium chloride 0.9 % 100 mL IVPB  Status:  Discontinued     500 mg 100 mL/hr over 60 Minutes Intravenous Every 12  hours 09/14/13 1040 09/15/13 1307   09/13/13 1300  piperacillin-tazobactam (ZOSYN) IVPB 3.375 g     3.375 g 12.5 mL/hr over 240 Minutes Intravenous 3 times per day 09/13/13 1218     09/13/13 1300  vancomycin (VANCOCIN) IVPB 750 mg/150 ml premix  Status:  Discontinued     750 mg 150 mL/hr over 60 Minutes Intravenous Every 24 hours 09/13/13 1219 09/14/13 1040      Assessment: 73 yo male with history of metastatic non-small cell lung CA, adenocarcinoma admitted with severe dyspnea. Recent admission in March with vancomycin and cefepime used for r/o HCAP.  Pharmacy was consulted to dose vancomycin and Zosyn for HCAP.  Vancomycin was DC'd on 5/7.  5/8:  Day #4 Zosyn 3.375 grams IV q8h (extended-infusion)  Tmax: afeb  WBCs: 9.6 (5/6)  Renal: 1.15 (5/6), CrCl ~ 47 ml/min   Goal of Therapy:  Appropriate antibiotic dosing for renal function; eradication of infection.   Plan:   Continue Zosyn 3.375g IV Q8H, each dose infused over 4hrs.  Follow clinical course.  Anticipate transition to PO Levaquin at discharge to complete course.  Clayburn Pert, PharmD, BCPS Pager: [phone] 09/16/2013  10:55 AM

## 2013-09-16 NOTE — Progress Notes (Addendum)
[facility] PROGRESS NOTE  Luis Payne GQQ:[id number] DOB: April 29, 1941 DOA: 09/13/2013 PCP: Glo Herring., MD  Assessment/Plan:  Acute Hypoxic respiratory failure -secondary to progressive lung malignancy and COPD, loculated effusion unclear if component of PNA  -On Vanc/Zosyn, cultures negative, stopped Vanc 5/7 and continue ZOsyn, change to levaquin at discharge -on IV solumedrol, nebs, no active wheezing, will cut this down and transition to Prednisone in am -thoracentesis not possible per IR given loculation, unsuccessful in April and Unable to place Chest tube per Dr.Bartle 3/31 -d/w IR who reviewed last Ct and felt the risks of thoracentesis outweighed benefits and hence deferrred this -overall prognosis very poor -Palliative consult requested for Goals of care, s/p family meeting, now Comfort care -continue Abx/lasix for now -unclear if we will be able to administer such high concentration of O2 at home, currently on NRM and 6L via [place] -plan for home with Hospice, appreciate Dr.Goldings help  H/p PE -on lovenox  Chronic systolic CHF -last ECHo with improved EF to 60% -continue Po lasix  AKI -improved  Widely metastatic Lung CA -appreciate Dr.Mohamed's consult, not candidate for further treatment -Palliative care following, now comfort care -pain controlled on MS Contin and roxanol  Constipation continue miralax BID, increase senokot, add dulcolax  DVT proph: lovenox  Code Status:DNR Family Communication: d/w wife at bedside Disposition Plan: home with hospice  Consultants:  ONc  Palliative  Antibiotics:  Vanc  Zosyn  HPI/Subjective: Feels ok, breathing so-so, tired and sleeping now  Objective: Filed Vitals:   09/16/13 0558  BP: 153/81  Pulse: 69  Temp: 97.7 F (36.5 C)  Resp: 22    Intake/Output Summary (Last 24 hours) at 09/16/13 1029 Last data filed at 09/16/13 0559  Gross per 24 hour  Intake      0 ml  Output   1325 ml  Net   -1325 ml   Filed Weights   09/13/13 1211 09/13/13 1325 09/14/13 0600  Weight: 59.421 kg (131 lb) 57.8 kg (127 lb 6.8 oz) 58.8 kg (129 lb 10.1 oz)    Exam:   General:  AAOxself, place with NRM and 6L [place]  Cardiovascular: S1S2/RRR  Respiratory: scattered ronchi, no wheezing  Abdomen: soft, more distended, BS present  Musculoskeletal: trace edema    Data Reviewed: Basic Metabolic Panel:  Recent Labs Lab 09/13/13 0936 09/13/13 0936 09/13/13 1100 09/13/13 1134 09/14/13 0255  NA  --  141 139 137 137  K  --  4.5 4.5 4.6 5.1  CL  --   --  96 94* 92*  CO2  --  33* 37*  --  39*  GLUCOSE  --  147* 121* 135* 132*  BUN  --  35.6* 34* 35* 32*  CREATININE  --  1.2 1.16 1.50* 1.15  CALCIUM  --  10.7* 10.4  --  9.5  MG  --  1.9  --   --   --   PHOS 3.8  --   --   --   --    Liver Function Tests:  Recent Labs Lab 09/13/13 0936  AST 24  ALT 11  ALKPHOS 124  BILITOT 0.22  PROT 5.8*  ALBUMIN 2.2*   No results found for this basename: LIPASE, AMYLASE,  in the last 168 hours No results found for this basename: AMMONIA,  in the last 168 hours CBC:  Recent Labs Lab 09/13/13 0936 09/13/13 1100 09/13/13 1134 09/14/13 0255  WBC 9.5 11.7*  --  9.6  NEUTROABS 8.3* 10.3*  --   --  HGB 10.2* 10.5* 10.9* 8.9*  HCT 30.8* 33.8* 32.0* 28.4*  MCV 99.4* 103.7*  --  101.8*  PLT 360 385  --  316   Cardiac Enzymes: No results found for this basename: CKTOTAL, CKMB, CKMBINDEX, TROPONINI,  in the last 168 hours BNP (last 3 results)  Recent Labs  07/31/13 1120 08/01/13 0411 09/13/13 1100  PROBNP 378.1* 422.0* 1825.0*   CBG: No results found for this basename: GLUCAP,  in the last 168 hours  Recent Results (from the past 240 hour(s))  CULTURE, BLOOD (ROUTINE X 2)     Status: None   Collection Time    09/13/13 10:55 AM      Result Value Ref Range Status   Specimen Description BLOOD RIGHT FOREARM   Final   Special Requests BOTTLES DRAWN AEROBIC AND ANAEROBIC 1.5CC EACH    Final   Culture  Setup Time     Final   Value: 09/13/2013 14:25     Performed at [facility]   Culture     Final   Value:        BLOOD CULTURE RECEIVED NO GROWTH TO DATE CULTURE WILL BE HELD FOR 5 DAYS BEFORE ISSUING A FINAL NEGATIVE REPORT     Performed at [facility]   Report Status PENDING   Incomplete  CULTURE, BLOOD (ROUTINE X 2)     Status: None   Collection Time    09/13/13 11:00 AM      Result Value Ref Range Status   Specimen Description BLOOD RIGHT ANTECUBITAL   Final   Special Requests BOTTLES DRAWN AEROBIC AND ANAEROBIC [facility] EACH   Final   Culture  Setup Time     Final   Value: 09/13/2013 14:26     Performed at [facility]   Culture     Final   Value:        BLOOD CULTURE RECEIVED NO GROWTH TO DATE CULTURE WILL BE HELD FOR 5 DAYS BEFORE ISSUING A FINAL NEGATIVE REPORT     Performed at [facility]   Report Status PENDING   Incomplete  MRSA PCR SCREENING     Status: None   Collection Time    09/13/13  1:20 PM      Result Value Ref Range Status   MRSA by PCR NEGATIVE  NEGATIVE Final   Comment:            The GeneXpert MRSA Assay (FDA     approved for NASAL specimens     only), is one component of a     comprehensive MRSA colonization     surveillance program. It is not     intended to diagnose MRSA     infection nor to guide or     monitor treatment for     MRSA infections.     Studies: No results found.  Scheduled Meds: . ALPRAZolam  0.5 mg Oral QHS  . bisacodyl  10 mg Rectal Once  . budesonide  0.25 mg Nebulization Q12H  . carvedilol  3.125 mg Oral BID WC  . digoxin  0.125 mg Oral Daily  . enoxaparin  1 mg/kg Subcutaneous QHS  . folic acid  1 mg Oral Daily  . furosemide  20 mg Oral Daily  . guaiFENesin  600 mg Oral Daily  . ipratropium  0.5 mg Nebulization Q4H  . lactose free nutrition  237 mL Oral TID BM  . levalbuterol  1.25 mg Nebulization 6 times per day  .  methylPREDNISolone (SOLU-MEDROL) injection  60 mg  Intravenous Q6H  . morphine  30 mg Oral Q12H  . piperacillin-tazobactam (ZOSYN)  IV  3.375 g Intravenous 3 times per day  . polyethylene glycol  17 g Oral BID  . [START ON 09/17/2013] predniSONE  50 mg Oral Q breakfast  . senna-docusate  2 tablet Oral BID  . sodium chloride  3 mL Intravenous Q12H   Continuous Infusions:  Antibiotics Given (last 72 hours)   Date/Time Action Medication Dose Rate   09/13/13 1259 Given   vancomycin (VANCOCIN) IVPB 750 mg/150 ml premix 750 mg 150 mL/hr   09/14/13 1119 Given   vancomycin (VANCOCIN) 500 mg in sodium chloride 0.9 % 100 mL IVPB 500 mg 100 mL/hr   09/14/13 2225 Given   vancomycin (VANCOCIN) 500 mg in sodium chloride 0.9 % 100 mL IVPB 500 mg 100 mL/hr   09/15/13 1245 Given   vancomycin (VANCOCIN) 500 mg in sodium chloride 0.9 % 100 mL IVPB 500 mg 100 mL/hr      Active Problems:   Respiratory failure   Protein-calorie malnutrition, severe    Time spent:10min    [facility] Pager [phone]. If 7PM-7AM, please contact night-coverage at [email or web address], password [facility] 09/16/2013, 10:29 AM  LOS: 3 days

## 2013-09-19 ENCOUNTER — Telehealth: Payer: Self-pay | Admitting: Medical Oncology

## 2013-09-19 ENCOUNTER — Ambulatory Visit: Payer: Medicare Other | Admitting: Radiation Oncology

## 2013-09-19 LAB — CULTURE, BLOOD (ROUTINE X 2): Culture: NO GROWTH

## 2013-09-19 LAB — CULTURE, RESPIRATORY W GRAM STAIN

## 2013-09-19 LAB — CULTURE, RESPIRATORY

## 2013-09-19 NOTE — Telephone Encounter (Signed)
Luis Payne reported blood culture report as gram positive rods and stated "likely skin contaminant". I left message for wendy to call pt PCP with results.

## 2013-09-20 ENCOUNTER — Other Ambulatory Visit: Payer: Self-pay | Admitting: *Deleted

## 2013-09-27 ENCOUNTER — Ambulatory Visit: Payer: Self-pay | Admitting: Internal Medicine

## 2013-09-27 ENCOUNTER — Ambulatory Visit: Payer: Self-pay

## 2013-09-27 ENCOUNTER — Other Ambulatory Visit: Payer: Self-pay

## 2013-09-30 NOTE — Progress Notes (Signed)
09/13/2013 Patient in to clinic for evaluation following three cycles of study treatment. CT scans were obtained at 9 weeks on treatment and were reviewed by Dr. Julien Nordmann today. In addition to general overall clinical decline, patient has also experienced evidence of disease progression on radiologic exam. As a result, treatment is being discontinued effective today. Due to severity of patient's illness at present, he will be sent to the [facility] ED for evaluation. Patient has reported mild insomnia since last Thursday, 09/08/2013. He continues to experience constipation, neuropathy, depression, and fatigue, unchanged from baseline (grade 1 adverse events). Moderate low back pain and anorexia (grade 2) are also ongoing. He has experienced increasing weakness over the past week affecting his daily activities, and increased dyspnea at rest over the past two days (both grade 2) . Cindy S. Brigitte Pulse BSN, RN, [place] 09/30/2013 10:33 AM

## 2013-10-10 DEATH — deceased

## 2015-08-23 IMAGING — CT CT ABD-PELV W/ CM
2 of 5 series · 15 of 46 positions shown, 17 images · IV contrast (OMNIPAQUE)
Comparison: CT ABD/PELVIS W CM dated 06/15/2013;

CLINICAL DATA: Metastatic lung cancer.  Recist 1.1.

EXAM:
CT CHEST, ABDOMEN, AND PELVIS WITH CONTRAST
RECIST RESEARCH SCORING
TECHNIQUE: Multidetector CT imaging of the chest, abdomen and pelvis was
performed following the standard protocol during bolus
administration of intravenous contrast.
CONTRAST:  100mL OMNIPAQUE IOHEXOL 300 MG/ML  SOLN

[Series 2: cap with st · axial · 0.76mm/px · z∈[-536,-30]mm · 12 of 115 slices shown, 14 images]
[im 7/115  soft-tissue]
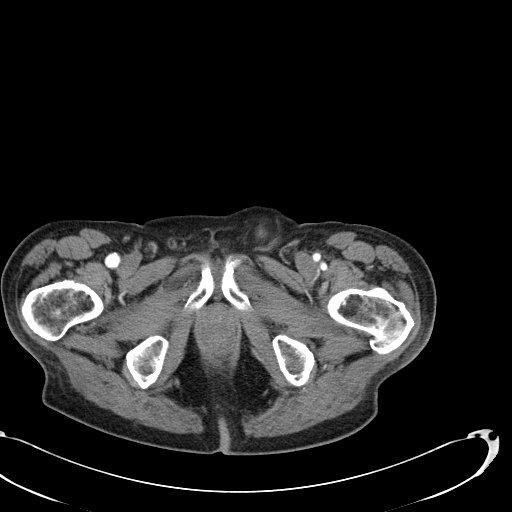
[im 7/115  bone]
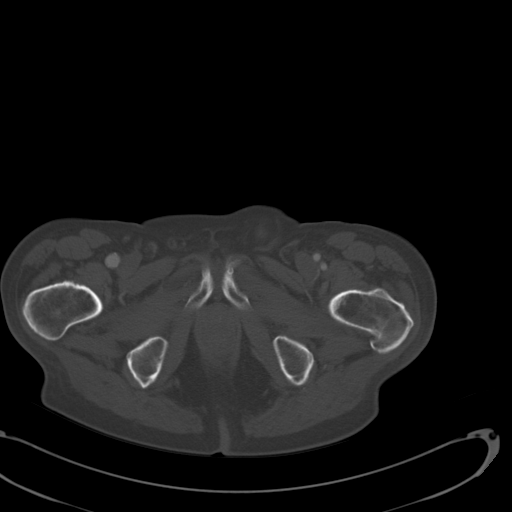
[im 21/115  soft-tissue]
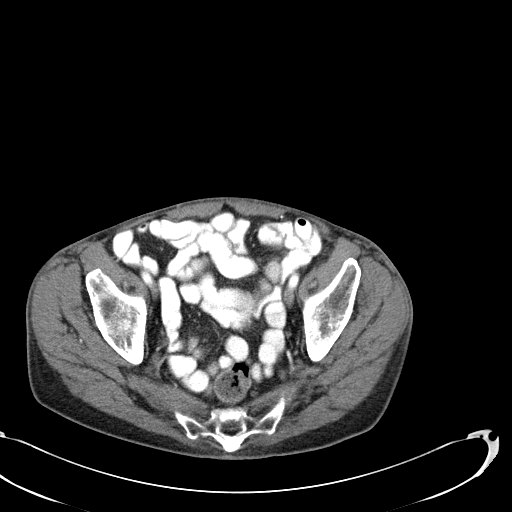
[im 27/115  soft-tissue]
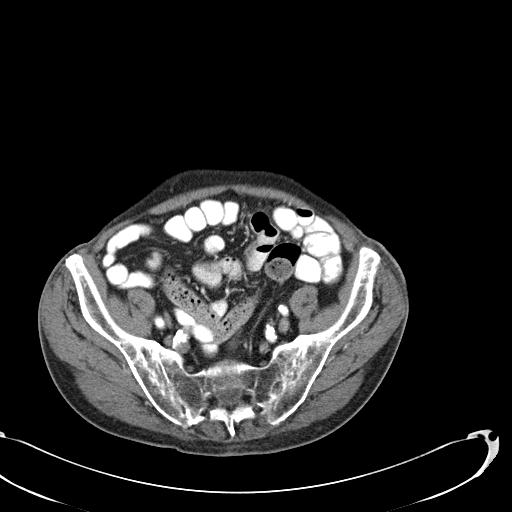
[im 34/115  soft-tissue]
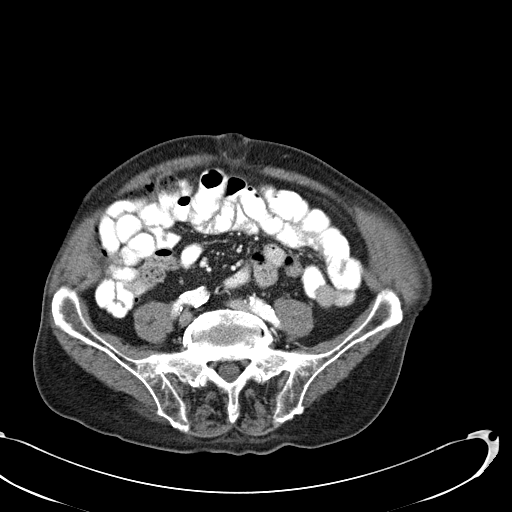
[im 47/115  soft-tissue]
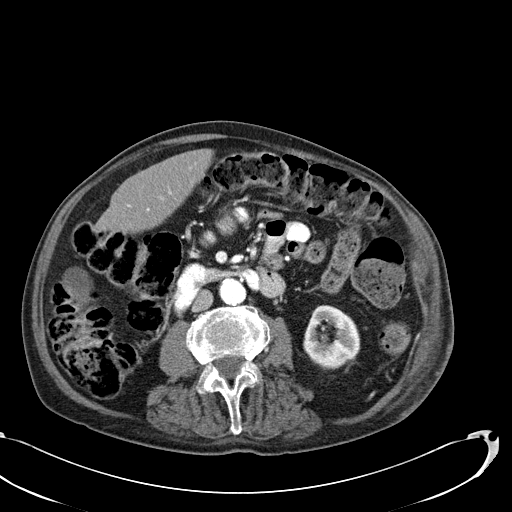
[im 54/115  soft-tissue]
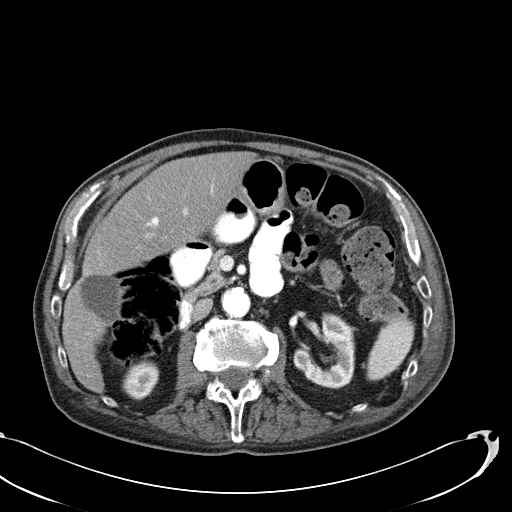
[im 61/115  soft-tissue]
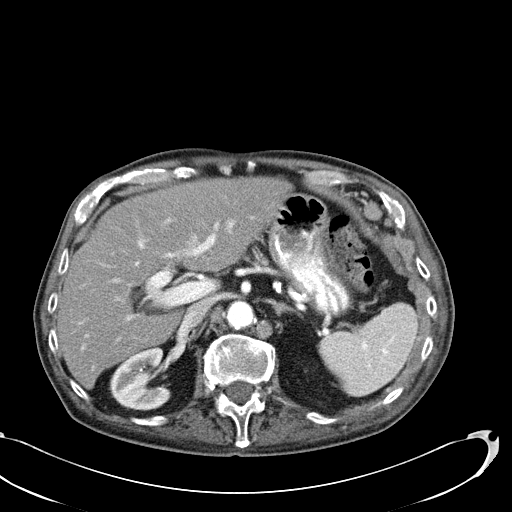
[im 74/115  soft-tissue]
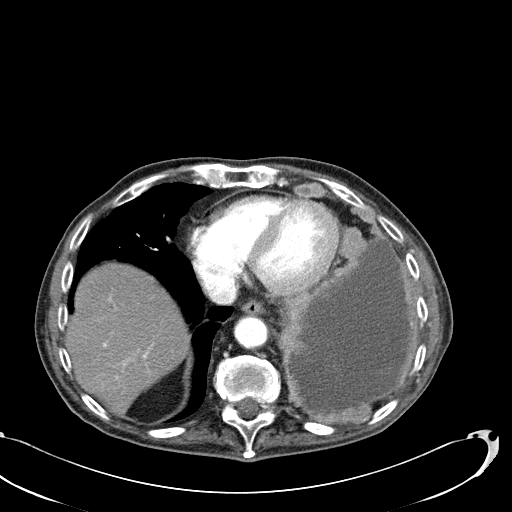
[im 81/115  soft-tissue]
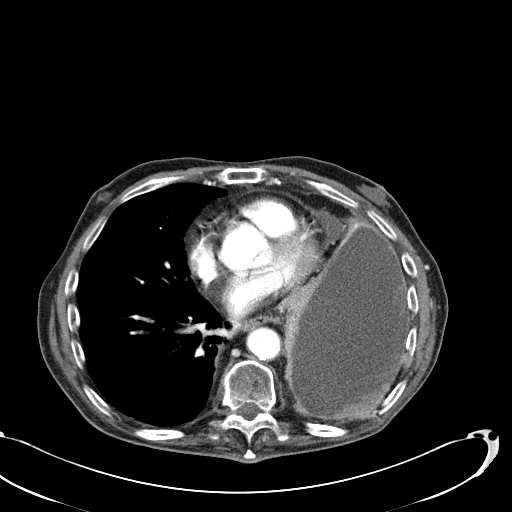
[im 81/115  bone]
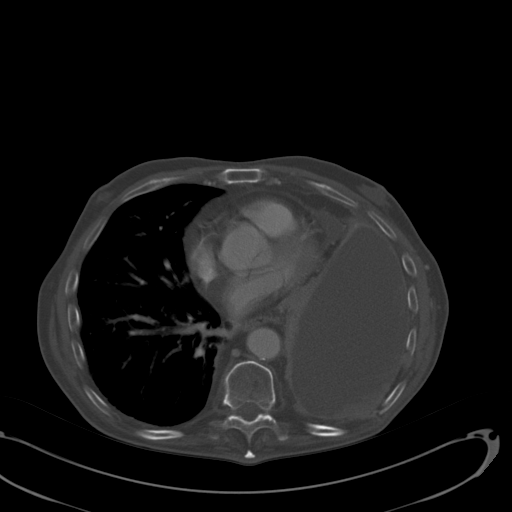
[im 88/115  soft-tissue]
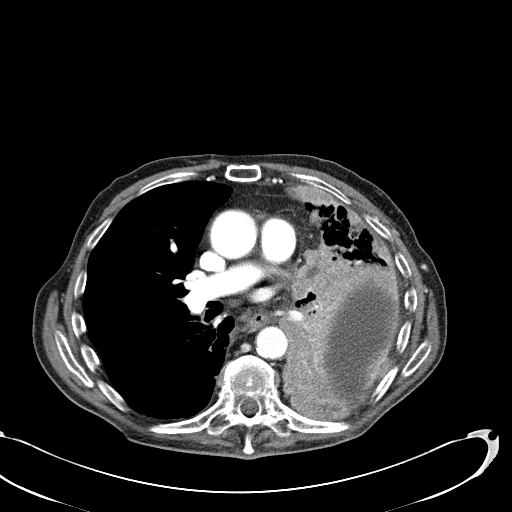
[im 101/115  soft-tissue]
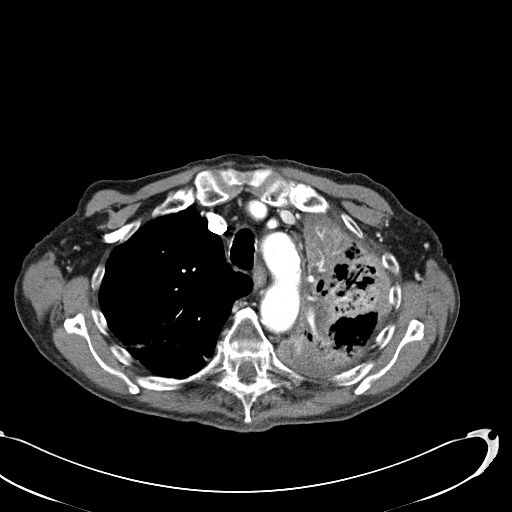
[im 108/115  soft-tissue]
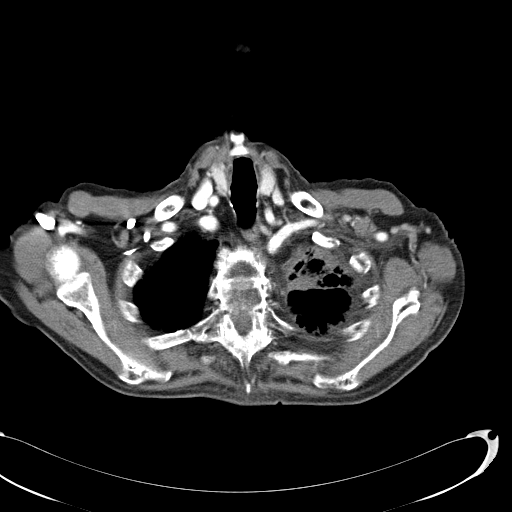

[Series 602: <mpr thick range> · coronal · 1.12mm/px · 3 of 88 slices shown]
[im 30/88  soft-tissue]
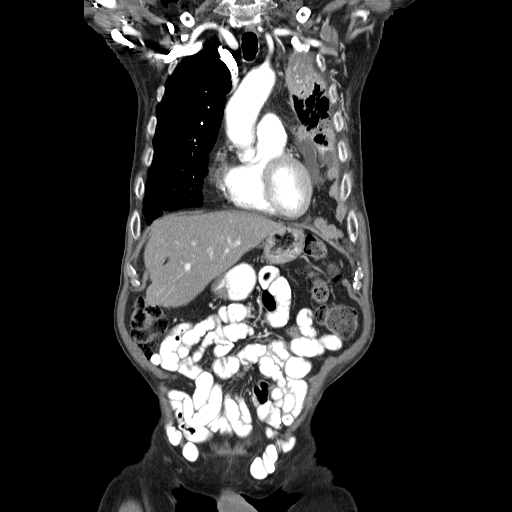
[im 39/88  soft-tissue]
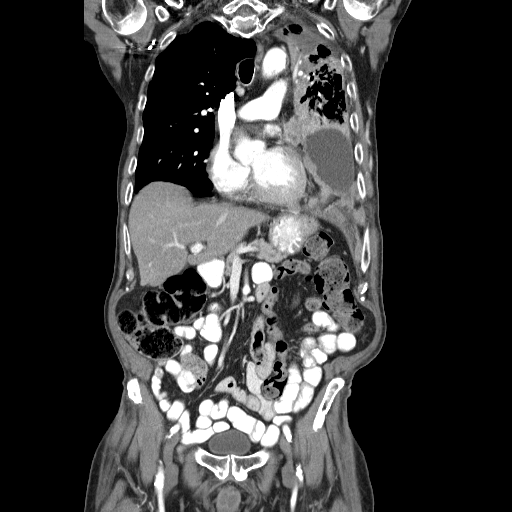
[im 49/88  soft-tissue]
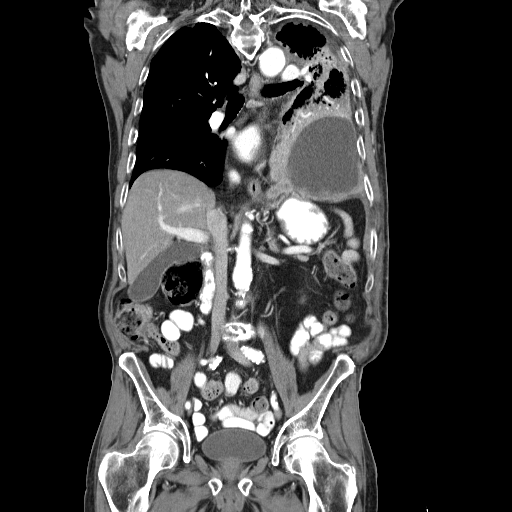

[15 of 46 positions shown; findings below may reference images not displayed]

CT ANGIO CHEST W/CM
&/OR WO/CM dated 05/02/2013; CT CHEST W/CM dated 04/18/2013; DG
CHEST 2 VIEW dated 06/18/2013
FINDINGS: RECIST

Target Lesions:

1. Left anterior lung apex mass, 3.4 cm in long axis on image 10 of
series 2 (formerly 3.1 cm).
2. Left anterior pleural/epicardial metastatic focus, 2.9 cm in the
long axis on image 42 of series 2 (formerly 2.9 cm).
3. Left periaortic lymph node, 1.4 cm in short axis on image 57 of
series 2 (formerly 1.3 cm).
Non-target Lesions:

1. Right middle lobe pulmonary nodule, 9 mm in long axis on image 36
of series 5 (formerly 7 mm).
---------------------

CT CHEST FINDINGS

Malignant left pleural effusion with thick multilobular rind of
tumor subjectively stable from prior exam. Left anterior apical mass
has similar morphology to the prior exam. There is considerable
consolidation in the left lung, as before, with less than [DATE] of the
left lung aerated. The amount of consolidation is mildly increased
posteriorly. Epicardial extension of tumor in finger-like
projections, similar to prior No overt rib destruction, although
some of the lobulations of pleural tumor extend into the intercostal
space likely with some degree of chest wall invasion as suggested on
image 39 of series 2.

There is a small amount of pericardial fluid above the cardiac apex,
with the medially adjacent epicardial tumor deposits.

Severe emphysema noted. 9 x 8 mm right middle lobe pulmonary nodule,
mildly increased in size compared to prior.

CT ABDOMEN AND PELVIS FINDINGS

Diffuse hepatic steatosis. Gastrohepatic ligament lymph node, 1.0 cm
in diameter, formerly 0.4 cm. Left retroperitoneal periaortic lymph
node, 1.4 cm in short axis, formerly 1.3 cm. No adrenal mass
observed. No other adenopathy identified.

No specific gallbladder or biliary abnormality identified. Stable
bilateral nonobstructive nephrolithiasis. Aortoiliac
atherosclerosis. Prominent stool throughout the colon favors
constipation. Bilateral inguinal hernias contain loops of small
bowel without findings of strangulation or obstruction. Small
umbilical hernia contains adipose tissue.

Mild asymmetry of the seminal vesicles, right larger than left, but
without a definite prostate mass.

Bilaterally symmetric fusion of the sacroiliac joints. Interspinous
ligament calcification with syndesmotic fight ankylosis in the
spine.
IMPRESSION: 1. Minimal advancement of metastatic burden compared to prior exam.
Suspected early chest wall invasion on the left.
2. Severe emphysema.
3. Osseous findings in the sacroiliac joints and spine suggest
ankylosing spondylitis.
4. Hepatic steatosis.
5.  Prominent stool throughout the colon favors constipation.
6. Bilateral inguinal hernias contain small bowel without
strangulation or obstruction.
